# Patient Record
Sex: Male | Born: 1935 | Race: White | Hispanic: No | Smoking: Never smoker
Health system: Southern US, Community
[De-identification: ages and names within clinical notes are randomized; demographics above are authoritative.]

## PROBLEM LIST (undated history)

## (undated) DIAGNOSIS — M1990 Unspecified osteoarthritis, unspecified site: Secondary | ICD-10-CM

## (undated) DIAGNOSIS — D509 Iron deficiency anemia, unspecified: Secondary | ICD-10-CM

## (undated) DIAGNOSIS — I4891 Unspecified atrial fibrillation: Secondary | ICD-10-CM

## (undated) DIAGNOSIS — F329 Major depressive disorder, single episode, unspecified: Secondary | ICD-10-CM

## (undated) DIAGNOSIS — D696 Thrombocytopenia, unspecified: Secondary | ICD-10-CM

## (undated) DIAGNOSIS — N400 Enlarged prostate without lower urinary tract symptoms: Secondary | ICD-10-CM

## (undated) DIAGNOSIS — G20 Parkinson's disease: Secondary | ICD-10-CM

## (undated) DIAGNOSIS — I10 Essential (primary) hypertension: Secondary | ICD-10-CM

## (undated) DIAGNOSIS — F32A Depression, unspecified: Secondary | ICD-10-CM

## (undated) DIAGNOSIS — G20A1 Parkinson's disease without dyskinesia, without mention of fluctuations: Secondary | ICD-10-CM

## (undated) DIAGNOSIS — A0472 Enterocolitis due to Clostridium difficile, not specified as recurrent: Secondary | ICD-10-CM

## (undated) DIAGNOSIS — D649 Anemia, unspecified: Secondary | ICD-10-CM

## (undated) DIAGNOSIS — F0390 Unspecified dementia without behavioral disturbance: Secondary | ICD-10-CM

## (undated) HISTORY — PX: JOINT REPLACEMENT: SHX530

## (undated) HISTORY — PX: TOTAL KNEE ARTHROPLASTY: SHX125

## (undated) HISTORY — PX: CARPAL TUNNEL RELEASE: SHX101

## (undated) HISTORY — DX: Essential (primary) hypertension: I10

## (undated) HISTORY — DX: Unspecified osteoarthritis, unspecified site: M19.90

---

## 2000-11-02 ENCOUNTER — Ambulatory Visit (HOSPITAL_COMMUNITY): Admission: RE | Admit: 2000-11-02 | Discharge: 2000-11-02 | Payer: Self-pay | Admitting: *Deleted

## 2000-11-07 ENCOUNTER — Ambulatory Visit (HOSPITAL_COMMUNITY): Admission: RE | Admit: 2000-11-07 | Discharge: 2000-11-07 | Payer: Self-pay | Admitting: *Deleted

## 2002-02-10 ENCOUNTER — Encounter: Admission: RE | Admit: 2002-02-10 | Discharge: 2002-02-10 | Payer: Self-pay | Admitting: Internal Medicine

## 2002-02-10 ENCOUNTER — Encounter: Payer: Self-pay | Admitting: Internal Medicine

## 2005-08-28 ENCOUNTER — Inpatient Hospital Stay (HOSPITAL_COMMUNITY): Admission: EM | Admit: 2005-08-28 | Discharge: 2005-09-01 | Payer: Self-pay | Admitting: Emergency Medicine

## 2005-09-01 ENCOUNTER — Ambulatory Visit: Payer: Self-pay | Admitting: Physical Medicine & Rehabilitation

## 2005-10-30 ENCOUNTER — Inpatient Hospital Stay (HOSPITAL_BASED_OUTPATIENT_CLINIC_OR_DEPARTMENT_OTHER): Admission: RE | Admit: 2005-10-30 | Discharge: 2005-10-30 | Payer: Self-pay | Admitting: Pediatrics

## 2005-10-30 HISTORY — PX: CARDIAC CATHETERIZATION: SHX172

## 2005-11-02 ENCOUNTER — Inpatient Hospital Stay (HOSPITAL_COMMUNITY): Admission: AD | Admit: 2005-11-02 | Discharge: 2005-11-03 | Payer: Self-pay | Admitting: Cardiology

## 2005-11-02 HISTORY — PX: CORONARY ANGIOPLASTY WITH STENT PLACEMENT: SHX49

## 2005-11-30 ENCOUNTER — Encounter (HOSPITAL_COMMUNITY): Admission: RE | Admit: 2005-11-30 | Discharge: 2006-02-28 | Payer: Self-pay | Admitting: Cardiology

## 2006-03-01 ENCOUNTER — Encounter (HOSPITAL_COMMUNITY): Admission: RE | Admit: 2006-03-01 | Discharge: 2006-04-16 | Payer: Self-pay | Admitting: Cardiology

## 2006-04-17 ENCOUNTER — Encounter (HOSPITAL_COMMUNITY): Admission: RE | Admit: 2006-04-17 | Discharge: 2006-07-09 | Payer: Self-pay | Admitting: Cardiology

## 2006-07-10 ENCOUNTER — Encounter (HOSPITAL_COMMUNITY): Admission: RE | Admit: 2006-07-10 | Discharge: 2006-10-08 | Payer: Self-pay | Admitting: Cardiology

## 2006-10-09 ENCOUNTER — Encounter (HOSPITAL_COMMUNITY): Admission: RE | Admit: 2006-10-09 | Discharge: 2007-01-07 | Payer: Self-pay | Admitting: Cardiology

## 2007-01-08 ENCOUNTER — Encounter (HOSPITAL_COMMUNITY): Admission: RE | Admit: 2007-01-08 | Discharge: 2007-02-05 | Payer: Self-pay | Admitting: Cardiology

## 2007-01-15 ENCOUNTER — Encounter: Payer: Self-pay | Admitting: Cardiology

## 2007-01-15 ENCOUNTER — Observation Stay (HOSPITAL_COMMUNITY): Admission: EM | Admit: 2007-01-15 | Discharge: 2007-01-16 | Payer: Self-pay | Admitting: Emergency Medicine

## 2007-02-07 ENCOUNTER — Encounter (HOSPITAL_COMMUNITY): Admission: RE | Admit: 2007-02-07 | Discharge: 2007-05-08 | Payer: Self-pay | Admitting: Cardiology

## 2007-05-09 ENCOUNTER — Encounter (HOSPITAL_COMMUNITY): Admission: RE | Admit: 2007-05-09 | Discharge: 2007-08-07 | Payer: Self-pay | Admitting: Cardiology

## 2007-08-08 ENCOUNTER — Encounter (HOSPITAL_COMMUNITY): Admission: RE | Admit: 2007-08-08 | Discharge: 2007-11-06 | Payer: Self-pay | Admitting: Cardiology

## 2007-11-07 ENCOUNTER — Encounter (HOSPITAL_COMMUNITY): Admission: RE | Admit: 2007-11-07 | Discharge: 2008-02-03 | Payer: Self-pay | Admitting: Cardiology

## 2007-12-23 ENCOUNTER — Emergency Department (HOSPITAL_COMMUNITY): Admission: EM | Admit: 2007-12-23 | Discharge: 2007-12-23 | Payer: Self-pay | Admitting: Emergency Medicine

## 2008-02-07 ENCOUNTER — Encounter (HOSPITAL_COMMUNITY): Admission: RE | Admit: 2008-02-07 | Discharge: 2008-05-07 | Payer: Self-pay | Admitting: Cardiology

## 2008-05-08 ENCOUNTER — Encounter (HOSPITAL_COMMUNITY): Admission: RE | Admit: 2008-05-08 | Discharge: 2008-08-06 | Payer: Self-pay | Admitting: Cardiology

## 2008-08-07 ENCOUNTER — Encounter (HOSPITAL_COMMUNITY): Admission: RE | Admit: 2008-08-07 | Discharge: 2008-11-05 | Payer: Self-pay | Admitting: Cardiology

## 2008-10-28 ENCOUNTER — Inpatient Hospital Stay (HOSPITAL_COMMUNITY): Admission: RE | Admit: 2008-10-28 | Discharge: 2008-10-31 | Payer: Self-pay | Admitting: Orthopedic Surgery

## 2009-07-05 ENCOUNTER — Inpatient Hospital Stay (HOSPITAL_COMMUNITY): Admission: EM | Admit: 2009-07-05 | Discharge: 2009-07-07 | Payer: Self-pay | Admitting: Emergency Medicine

## 2010-02-03 ENCOUNTER — Emergency Department (HOSPITAL_COMMUNITY)
Admission: EM | Admit: 2010-02-03 | Discharge: 2010-02-03 | Payer: Self-pay | Source: Home / Self Care | Admitting: Emergency Medicine

## 2010-02-11 ENCOUNTER — Ambulatory Visit: Payer: Self-pay | Admitting: Cardiology

## 2010-04-18 LAB — PROTIME-INR
INR: 2.64 — ABNORMAL HIGH (ref 0.00–1.49)
Prothrombin Time: 28.3 seconds — ABNORMAL HIGH (ref 11.6–15.2)

## 2010-04-18 LAB — POCT CARDIAC MARKERS: Troponin i, poc: <0.05 ng/mL (ref 0.00–0.09)

## 2010-04-18 LAB — POCT I-STAT, CHEM 8
BUN: 21 mg/dL (ref 6–23)
Calcium, Ion: 1.18 mmol/L (ref 1.12–1.32)
Creatinine, Ser: 1.1 mg/dL (ref 0.4–1.5)
HCT: 35 % — ABNORMAL LOW (ref 39.0–52.0)
Potassium: 3.8 mEq/L (ref 3.5–5.1)
TCO2: 27 mmol/L (ref 0–100)

## 2010-04-25 LAB — POCT CARDIAC MARKERS
CKMB, poc: 1.3 ng/mL (ref 1.0–8.0)
Myoglobin, poc: 57.2 ng/mL (ref 12–200)
Myoglobin, poc: 86.2 ng/mL (ref 12–200)
Troponin i, poc: <0.05 ng/mL (ref 0.00–0.09)
Troponin i, poc: <0.05 ng/mL (ref 0.00–0.09)

## 2010-04-25 LAB — CBC
MCHC: 33.4 g/dL (ref 30.0–36.0)
MCV: 94.3 fL (ref 78.0–100.0)
MCV: 93.1 fL (ref 78.0–100.0)
Platelets: 92 10*3/uL — ABNORMAL LOW (ref 150–400)
Platelets: 89 10*3/uL — ABNORMAL LOW (ref 150–400)
Platelets: 90 10*3/uL — ABNORMAL LOW (ref 150–400)
RBC: 3.67 MIL/uL — ABNORMAL LOW (ref 4.22–5.81)
RBC: 3.53 MIL/uL — ABNORMAL LOW (ref 4.22–5.81)
RBC: 3.66 MIL/uL — ABNORMAL LOW (ref 4.22–5.81)
WBC: 5.9 10*3/uL (ref 4.0–10.5)

## 2010-04-25 LAB — CARDIAC PANEL(CRET KIN+CKTOT+MB+TROPI)
CK, MB: 3.1 ng/mL (ref 0.3–4.0)
CK, MB: 3.2 ng/mL (ref 0.3–4.0)
Relative Index: INVALID (ref 0.0–2.5)
Relative Index: INVALID (ref 0.0–2.5)
Total CK: 65 U/L (ref 7–232)
Troponin I: 0.05 ng/mL (ref 0.00–0.06)

## 2010-04-25 LAB — BASIC METABOLIC PANEL
CO2: 27 mEq/L (ref 19–32)
Calcium: 8.5 mg/dL (ref 8.4–10.5)
Calcium: 8.5 mg/dL (ref 8.4–10.5)
GFR calc Af Amer: >60 mL/min (ref >60)
GFR calc non Af Amer: >60 mL/min (ref >60)
GFR calc non Af Amer: >60 mL/min (ref >60)
Glucose, Bld: 110 mg/dL — ABNORMAL HIGH (ref 70–99)
Potassium: 3.6 mEq/L (ref 3.5–5.1)
Potassium: 3.7 mEq/L (ref 3.5–5.1)

## 2010-04-25 LAB — HEPARIN LEVEL (UNFRACTIONATED)
Heparin Unfractionated: 0.63 IU/mL (ref 0.30–0.70)
Heparin Unfractionated: 0.57 IU/mL (ref 0.30–0.70)
Heparin Unfractionated: 0.9 IU/mL — ABNORMAL HIGH (ref 0.30–0.70)

## 2010-04-25 LAB — TROPONIN I: Troponin I: 0.04 ng/mL (ref 0.00–0.06)

## 2010-04-25 LAB — CK TOTAL AND CKMB (NOT AT ARMC): Total CK: 70 U/L (ref 7–232)

## 2010-04-25 LAB — APTT: aPTT: 29 seconds (ref 24–37)

## 2010-04-25 LAB — PROTIME-INR
INR: 1.47 (ref 0.00–1.49)
INR: 1.51 — ABNORMAL HIGH (ref 0.00–1.49)

## 2010-05-13 LAB — CBC
HCT: 25.1 % — ABNORMAL LOW (ref 39.0–52.0)
HCT: 26.8 % — ABNORMAL LOW (ref 39.0–52.0)
Hemoglobin: 10 g/dL — ABNORMAL LOW (ref 13.0–17.0)
Hemoglobin: 8.6 g/dL — ABNORMAL LOW (ref 13.0–17.0)
Hemoglobin: 9.2 g/dL — ABNORMAL LOW (ref 13.0–17.0)
Hemoglobin: 12.3 g/dL — ABNORMAL LOW (ref 13.0–17.0)
MCV: 94 fL (ref 78.0–100.0)
MCV: 94.6 fL (ref 78.0–100.0)
MCV: 93.8 fL (ref 78.0–100.0)
Platelets: 74 10*3/uL — ABNORMAL LOW (ref 150–400)
RBC: 2.65 MIL/uL — ABNORMAL LOW (ref 4.22–5.81)
RBC: 2.85 MIL/uL — ABNORMAL LOW (ref 4.22–5.81)
RBC: 3.16 MIL/uL — ABNORMAL LOW (ref 4.22–5.81)
RBC: 3.93 MIL/uL — ABNORMAL LOW (ref 4.22–5.81)
WBC: 7.1 10*3/uL (ref 4.0–10.5)
WBC: 8.1 10*3/uL (ref 4.0–10.5)
WBC: 9.2 10*3/uL (ref 4.0–10.5)
WBC: 5.1 10*3/uL (ref 4.0–10.5)

## 2010-05-13 LAB — URINALYSIS, ROUTINE W REFLEX MICROSCOPIC
Hgb urine dipstick: NEGATIVE
Specific Gravity, Urine: 1.01 (ref 1.005–1.030)
Urobilinogen, UA: 0.2 mg/dL (ref 0.0–1.0)
pH: 7 (ref 5.0–8.0)

## 2010-05-13 LAB — BASIC METABOLIC PANEL
CO2: 28 mEq/L (ref 19–32)
Calcium: 8.2 mg/dL — ABNORMAL LOW (ref 8.4–10.5)
Chloride: 107 mEq/L (ref 96–112)
Creatinine, Ser: 0.97 mg/dL (ref 0.4–1.5)
GFR calc Af Amer: >60 mL/min (ref >60)
GFR calc Af Amer: >60 mL/min (ref >60)
GFR calc non Af Amer: >60 mL/min (ref >60)
Potassium: 4 mEq/L (ref 3.5–5.1)
Sodium: 134 mEq/L — ABNORMAL LOW (ref 135–145)
Sodium: 136 mEq/L (ref 135–145)

## 2010-05-13 LAB — COMPREHENSIVE METABOLIC PANEL
ALT: 19 U/L (ref 0–53)
AST: 33 U/L (ref 0–37)
CO2: 30 mEq/L (ref 19–32)
Chloride: 105 mEq/L (ref 96–112)
Creatinine, Ser: 1.01 mg/dL (ref 0.4–1.5)
GFR calc Af Amer: >60 mL/min (ref >60)
GFR calc non Af Amer: >60 mL/min (ref >60)
Sodium: 140 mEq/L (ref 135–145)
Total Bilirubin: 1 mg/dL (ref 0.3–1.2)

## 2010-05-13 LAB — PROTIME-INR
INR: 1.3 (ref 0.00–1.49)
Prothrombin Time: 14.9 seconds (ref 11.6–15.2)
Prothrombin Time: 15.7 seconds — ABNORMAL HIGH (ref 11.6–15.2)
Prothrombin Time: 20.1 seconds — ABNORMAL HIGH (ref 11.6–15.2)

## 2010-05-13 LAB — ABO/RH: ABO/RH(D): A POS

## 2010-05-13 LAB — TYPE AND SCREEN

## 2010-06-21 NOTE — Discharge Summary (Signed)
NAME:  Henry Alvarez, Henry Alvarez NO.:  [id number]   MEDICAL RECORD NO.:  [id number]          PATIENT TYPE:  INP   LOCATION:  2004                         FACILITY:  [facility]   PHYSICIAN:  Peter M. Swaziland, M.D.  DATE OF BIRTH:  09-Mar-1924   DATE OF ADMISSION:  01/15/2007  DATE OF DISCHARGE:  01/16/2007                               DISCHARGE SUMMARY   HISTORY OF PRESENT ILLNESS:  Henry Alvarez is an 77 year old white male  who presented with new onset of atrial fibrillation.  This awoke him  from sleep at 4 a.m.  He had some vague sensation in his chest and arms  and jaw tightness and noticed his heart racing.  He presented to the  emergency room and was found to be in atrial fibrillation with rapid  ventricular response at a rate of 125 beats per minute.  He subsequently  converted to normal sinus rhythm.  He was admitted for further  observation.  For details his past medical history, social history,  family history and physical exam, please see admission history and  physical.   LABORATORY DATA:  Chest x-ray showed no active disease.  ECG showed  atrial fibrillation with a rapid ventricular response.  He had LVH and  nonspecific ST abnormality.  Subsequent ECG showed sinus bradycardia,  again with LVH and nonspecific T-wave abnormality.  Serial cardiac  enzymes were negative x4.  Sodium 139, potassium 4.0, chloride 105, CO2  26, BUN 16, creatinine 1.1, glucose was 104.  White count was 4900,  hemoglobin 12.5, hematocrit 36.3, platelets 116,000.  Free T4 was 1.25.  TSH was 1.727.   HOSPITAL COURSE:  The patient was admitted to telemetry monitoring.  He  was started on subcu Lovenox and begun on Coumadin.  We did not place  him on the AV nodal blocking drugs due to the fact that he had a marked  sinus bradycardia.  During the 24 hours of this stay, he had persistent  sinus bradycardia with heart rates running anywhere from the mid 40s to  mid 50s.  He had no pauses.  He  remained asymptomatic.  The patient's  history does demonstrate per wife that he has a history of heavy snoring  and some apneic spells, so we recommended follow-up sleep study as an  outpatient.  It was felt that if he did have recurrent atrial  fibrillation that required additional medication he would require a  pacemaker, but at this time he was doing very well.  We did obtain an  echocardiogram.  This demonstrated normal left ventricular function.  He  had mild aortic stenosis and moderate aortic insufficiency, and he had  mild mitral insufficiency.  The patient was discharged home in stable  condition on January 16, 2007.   DISCHARGE MEDICATIONS:  1. Norvasc 5 mg per day.  2. Simvastatin to be continued on current dose.  3. Multivitamin daily.  4. Calcium supplementation twice daily.  5. Flomax 0.4 mg every other day.  6. Aspirin was reduced to 81 mg per day.  7. Will start Coumadin 5 mg per day.   I  have recommended a follow-up pro time at Dr. Rinaldo Cloud office on Monday  December 15.  We will arrange a sleep study as an outpatient.  He will  see Dr. Swaziland back in 1 week for follow-up.   DISCHARGE STATUS:  Improved.           ______________________________  Peter M. Swaziland, M.D.     PMJ/MEDQ  D:  01/16/2007  T:  01/16/2007  Job:  [id number]   cc:   Jeannett Senior A. Evlyn Kanner, M.D.
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
## 2010-06-24 NOTE — Cardiovascular Report (Signed)
NAME:  Henry Alvarez, Henry Alvarez NO.:  [id number]   MEDICAL RECORD NO.:  [id number]          PATIENT TYPE:  INP   LOCATION:  6523                         FACILITY:  [facility]   PHYSICIAN:  Peter M. [place]aziland, M.D.  DATE OF BIRTH:  [birth year]   DATE OF PROCEDURE:  11/02/2005  DATE OF DISCHARGE:  11/03/2005                              CARDIAC CATHETERIZATION   INDICATIONS FOR PROCEDURE:  The patient is a 77 year old white male who  presents with 3 months' history of exertional angina.  He had a strongly  positive stress Cardiolite study.  Subsequent cardiac catheterization  demonstrated single-vessel obstructive disease with a high-grade 90%  stenosis in the proximal LAD.   PROCEDURE:  Intracoronary stenting of the proximal left anterior descending  artery.   ACCESS:  Via right femoral artery using standard Seldinger technique.   EQUIPMENT USED:  A 6-French FL-5 guide, a 0.014 high-torque floppy wire, a  3.0 x 12 mm Liberte stent and a 3.0 x 12 mm Quantum Maverick balloon.   CONTRAST:  Omnipaque 95 mL.   MEDICATIONS:  1. Angiomax bolus at 0.75 mg/kg, followed by continuous infusion of 1.75      mg/kg per hour.  2. Nitroglycerin 200 mcg intracoronary x2.   ACT was 314 seconds.  The patient remained hemodynamically stable throughout  the procedure.   We proceeded with intracoronary stenting of the proximal LAD.  This lesion  was crossed easily with the wire.  We primarily stented the lesion using a  3.0 x 12 mm Liberte stent.  This was deployed at 9 atmospheres and then  postdilated to 14 atmospheres with the stent balloon.  We then exchanged for  a 3.0 x 12 mm Quantum balloon and postdilated to 16 atmospheres.  This  yielded an excellent angiographic result with 0% residual stenosis and TIMI  grade 3 flow.  The patient was pain-free at the end of procedure.   FINAL INTERPRETATION:  Successful intracoronary stenting of the proximal  left anterior descending artery.          ______________________________  Peter M. [place], M.D.     PMJ/MEDQ  D:  11/02/2005  T:  11/04/2005  Job:  [id number]   cc:   Jeannett Senior A. Evlyn Kanner, M.D.

## 2010-08-08 ENCOUNTER — Encounter: Payer: Self-pay | Admitting: *Deleted

## 2010-08-12 ENCOUNTER — Encounter: Payer: Self-pay | Admitting: Cardiology

## 2010-08-16 ENCOUNTER — Encounter: Payer: Self-pay | Admitting: Cardiology

## 2010-08-16 ENCOUNTER — Ambulatory Visit (INDEPENDENT_AMBULATORY_CARE_PROVIDER_SITE_OTHER): Payer: Medicare Other | Admitting: Cardiology

## 2010-08-16 VITALS — BP 140/88 | HR 53 | Ht 67.0 in | Wt 168.4 lb

## 2010-08-16 DIAGNOSIS — I480 Paroxysmal atrial fibrillation: Secondary | ICD-10-CM | POA: Insufficient documentation

## 2010-08-16 DIAGNOSIS — M4850XA Collapsed vertebra, not elsewhere classified, site unspecified, initial encounter for fracture: Secondary | ICD-10-CM

## 2010-08-16 DIAGNOSIS — I4891 Unspecified atrial fibrillation: Secondary | ICD-10-CM

## 2010-08-16 DIAGNOSIS — E785 Hyperlipidemia, unspecified: Secondary | ICD-10-CM

## 2010-08-16 DIAGNOSIS — W19XXXA Unspecified fall, initial encounter: Secondary | ICD-10-CM

## 2010-08-16 DIAGNOSIS — Z7901 Long term (current) use of anticoagulants: Secondary | ICD-10-CM | POA: Insufficient documentation

## 2010-08-16 DIAGNOSIS — I2510 Atherosclerotic heart disease of native coronary artery without angina pectoris: Secondary | ICD-10-CM

## 2010-08-16 DIAGNOSIS — I10 Essential (primary) hypertension: Secondary | ICD-10-CM

## 2010-08-16 NOTE — Assessment & Plan Note (Signed)
He has had no recurrent atrial fibrillation since December of 2011 at which time he converted spontaneously. We did discuss the possibility that if the had recurrences we would need to consider a pacemaker for management.
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
## 2010-08-16 NOTE — Patient Instructions (Signed)
Continue your current medications.  Call me if you have any more falls, blacking out, or dizzyness.  I will see you again in 6 months.

## 2010-08-16 NOTE — Assessment & Plan Note (Addendum)
He remains asymptomatic. We will continue with his risk factor modification. Stress Cardiolite study in June of 2011 was normal.

## 2010-08-16 NOTE — Assessment & Plan Note (Signed)
It is unclear what precipitated this fall and whether or not he may have had some associated lightheadedness or syncope. He does have a long history of sinus bradycardia with intermittent atrial fibrillation. He is on no rate slowing medications at this time. I have recommended that if he has any recurrent falls, syncope, or lightheadedness that he let me know. I think at least we would need to consider an event monitor to make sure he is not having significant pauses. For this period of time we will continue to monitor.

## 2010-08-18 ENCOUNTER — Encounter: Payer: Self-pay | Admitting: Cardiology

## 2010-09-07 HISTORY — PX: KYPHOSIS SURGERY: SHX114

## 2010-09-26 ENCOUNTER — Encounter (HOSPITAL_COMMUNITY)
Admission: RE | Admit: 2010-09-26 | Discharge: 2010-09-26 | Disposition: A | Discharge status: Home / Self Care | Payer: Medicare Other | Source: Ambulatory Visit | Attending: Orthopedic Surgery | Admitting: Orthopedic Surgery

## 2010-09-26 ENCOUNTER — Ambulatory Visit (HOSPITAL_COMMUNITY)
Admission: RE | Admit: 2010-09-26 | Discharge: 2010-09-26 | Disposition: A | Discharge status: Home / Self Care | Payer: Medicare Other | Source: Ambulatory Visit | Attending: Orthopedic Surgery | Admitting: Orthopedic Surgery

## 2010-09-26 ENCOUNTER — Telehealth: Payer: Self-pay | Admitting: Cardiology

## 2010-09-26 ENCOUNTER — Other Ambulatory Visit (HOSPITAL_COMMUNITY): Payer: Self-pay | Admitting: Orthopedic Surgery

## 2010-09-26 DIAGNOSIS — Z01818 Encounter for other preprocedural examination: Secondary | ICD-10-CM | POA: Insufficient documentation

## 2010-09-26 DIAGNOSIS — M8448XA Pathological fracture, other site, initial encounter for fracture: Secondary | ICD-10-CM | POA: Insufficient documentation

## 2010-09-26 DIAGNOSIS — S22000A Wedge compression fracture of unspecified thoracic vertebra, initial encounter for closed fracture: Secondary | ICD-10-CM

## 2010-09-26 DIAGNOSIS — M4120 Other idiopathic scoliosis, site unspecified: Secondary | ICD-10-CM | POA: Insufficient documentation

## 2010-09-26 DIAGNOSIS — K5900 Constipation, unspecified: Secondary | ICD-10-CM | POA: Insufficient documentation

## 2010-09-26 DIAGNOSIS — Z01812 Encounter for preprocedural laboratory examination: Secondary | ICD-10-CM | POA: Insufficient documentation

## 2010-09-26 DIAGNOSIS — S32000A Wedge compression fracture of unspecified lumbar vertebra, initial encounter for closed fracture: Secondary | ICD-10-CM

## 2010-09-26 DIAGNOSIS — M479 Spondylosis, unspecified: Secondary | ICD-10-CM | POA: Insufficient documentation

## 2010-09-26 LAB — BASIC METABOLIC PANEL
BUN: 16 mg/dL (ref 6–23)
CO2: 29 mEq/L (ref 19–32)
Chloride: 106 mEq/L (ref 96–112)
Creatinine, Ser: 0.89 mg/dL (ref 0.50–1.35)

## 2010-09-26 LAB — CBC
HCT: 37.8 % — ABNORMAL LOW (ref 39.0–52.0)
MCV: 92.9 fL (ref 78.0–100.0)
Platelets: 121 10*3/uL — ABNORMAL LOW (ref 150–400)
RBC: 4.07 MIL/uL — ABNORMAL LOW (ref 4.22–5.81)
RDW: 13.6 % (ref 11.5–15.5)
WBC: 5.9 10*3/uL (ref 4.0–10.5)

## 2010-09-26 LAB — APTT: aPTT: 47 seconds — ABNORMAL HIGH (ref 24–37)

## 2010-09-26 LAB — PROTIME-INR: INR: 2.22 — ABNORMAL HIGH (ref 0.00–1.49)

## 2010-09-26 NOTE — Telephone Encounter (Signed)
Fax all recent notes and diagnostic testing; including any EKG

## 2010-09-27 NOTE — Telephone Encounter (Signed)
Faxed last 2 OV Notes, last 2 EKG's, No ECHO available, and last Stress Faxed all today

## 2010-10-05 ENCOUNTER — Ambulatory Visit (HOSPITAL_COMMUNITY): Payer: Medicare Other

## 2010-10-05 ENCOUNTER — Ambulatory Visit (HOSPITAL_COMMUNITY)
Admission: RE | Admit: 2010-10-05 | Discharge: 2010-10-07 | Disposition: A | Discharge status: Home / Self Care | Payer: Medicare Other | Source: Ambulatory Visit | Attending: Orthopedic Surgery | Admitting: Orthopedic Surgery

## 2010-10-05 DIAGNOSIS — Z01812 Encounter for preprocedural laboratory examination: Secondary | ICD-10-CM | POA: Insufficient documentation

## 2010-10-05 DIAGNOSIS — Z8673 Personal history of transient ischemic attack (TIA), and cerebral infarction without residual deficits: Secondary | ICD-10-CM | POA: Insufficient documentation

## 2010-10-05 DIAGNOSIS — I4891 Unspecified atrial fibrillation: Secondary | ICD-10-CM | POA: Insufficient documentation

## 2010-10-05 DIAGNOSIS — I2510 Atherosclerotic heart disease of native coronary artery without angina pectoris: Secondary | ICD-10-CM | POA: Insufficient documentation

## 2010-10-05 DIAGNOSIS — Z7901 Long term (current) use of anticoagulants: Secondary | ICD-10-CM | POA: Insufficient documentation

## 2010-10-05 DIAGNOSIS — Z96659 Presence of unspecified artificial knee joint: Secondary | ICD-10-CM | POA: Insufficient documentation

## 2010-10-05 DIAGNOSIS — M129 Arthropathy, unspecified: Secondary | ICD-10-CM | POA: Insufficient documentation

## 2010-10-05 DIAGNOSIS — M4120 Other idiopathic scoliosis, site unspecified: Secondary | ICD-10-CM | POA: Insufficient documentation

## 2010-10-05 DIAGNOSIS — Z9861 Coronary angioplasty status: Secondary | ICD-10-CM | POA: Insufficient documentation

## 2010-10-05 DIAGNOSIS — R339 Retention of urine, unspecified: Secondary | ICD-10-CM | POA: Insufficient documentation

## 2010-10-05 DIAGNOSIS — M8448XA Pathological fracture, other site, initial encounter for fracture: Secondary | ICD-10-CM | POA: Insufficient documentation

## 2010-10-05 DIAGNOSIS — I10 Essential (primary) hypertension: Secondary | ICD-10-CM | POA: Insufficient documentation

## 2010-10-05 LAB — URINE MICROSCOPIC-ADD ON

## 2010-10-05 LAB — PROTIME-INR
INR: 1.2 (ref 0.00–1.49)
Prothrombin Time: 15.5 s — ABNORMAL HIGH (ref 11.6–15.2)

## 2010-10-05 LAB — URINALYSIS, ROUTINE W REFLEX MICROSCOPIC
Bilirubin Urine: NEGATIVE
Nitrite: NEGATIVE
Specific Gravity, Urine: 1.017 (ref 1.005–1.030)
Urobilinogen, UA: 0.2 mg/dL (ref 0.0–1.0)

## 2010-10-05 LAB — APTT: aPTT: 30 s (ref 24–37)

## 2010-10-06 LAB — URINE CULTURE: Culture  Setup Time: 201208292301

## 2010-10-07 NOTE — Op Note (Signed)
  NAME:  CLIF, SERIO NO.:  [id number]  MEDICAL RECORD NO.:  [id number]  LOCATION:                                 FACILITY:  PHYSICIAN:  Alvy Beal, MD    DATE OF BIRTH:  1924-05-28  DATE OF PROCEDURE:  10/05/2010 DATE OF DISCHARGE:                              OPERATIVE REPORT   PREOPERATIVE DIAGNOSIS:  Compression fracture deformity (pathologic) T11 and L2.  POSTOPERATIVE DIAGNOSIS:  Compression fracture deformity (pathologic) T11 and L2.  OPERATIVE PROCEDURE:  T11-L2 kyphoplasty.  COMPLICATIONS:  The patient has underlying significant degenerative scoliosis which made accessing the left T11 and L2 pedicles quite difficult and so I just did a unilateral approach.  There was no cement leak, had good fill.  FIRST ASSISTANT:  Norval Gable, [place].  HISTORY:  This is a very pleasant 77 year old gentleman with significant back pain with no radicular leg pain.  Attempts at conservative management failed to alleviate his pain and he was diagnosed with acute to subacute L2 fracture with a subacute to chronic T11 fracture.  After discussing risks, benefits, and alternative of surgery, he elected to proceed.  All appropriate risks and benefits were reviewed.  OPERATIVE NOTE:  The patient was brought to the operating room, placed supine on the operating table.  After successful induction of general anesthesia and endotracheal intubation, TEDs, SCDs were applied.  He was turned prone onto a chest and pelvic roll.  The back was prepped and draped in standard fashion.  A time-out was done to confirm patient procedure, affected levels.  Once this was done, 2 x-rays were sterilely brought into the field.  I identified the T11 vertebral body in the AP and lateral planes.  A small stab incision was made on the lateral aspect of each pedicle.  Trocars were advanced percutaneously to the lateral aspect of pedicle.  A extrapedicular/transpedicular approach  was taken.  Once the trocar was beyond the posterior body of the vertebral wall, I removed the inner aspect and left the working portal in place. I repeated this at L2.  Because of his underlying deformity, it was quite difficult getting the left-sided pedicle cannulated either from a transpedicular or extrapedicular approach.  Because of the difficulty and relative risks to the surrounding soft tissues, I elected not to place them and I only used the unilateral working trocar.  I then placed balloons, inflated them, and then inserted the cement.  There was no adverse complications placed in the cement.  Once the cement was allowed to cure, I removed the trocars, washed the wounds, closed them with interrupted 3-0 Monocryl, and dry dressings.  The patient was extubated, transferred to the PACU without incident.  At the end of the case, all needle and sponge counts were correct.     Alvy Beal, MD     DDB/MEDQ  D:  10/05/2010  T:  10/05/2010  Job:  [id number]  Electronically Signed by Venita Lick MD on 10/07/2010 03:50:18 PM

## 2010-10-12 ENCOUNTER — Emergency Department (HOSPITAL_COMMUNITY)
Admission: EM | Admit: 2010-10-12 | Discharge: 2010-10-13 | Disposition: A | Discharge status: Home / Self Care | Payer: Medicare Other | Attending: Emergency Medicine | Admitting: Emergency Medicine

## 2010-10-12 DIAGNOSIS — Z7901 Long term (current) use of anticoagulants: Secondary | ICD-10-CM | POA: Insufficient documentation

## 2010-10-12 DIAGNOSIS — I2510 Atherosclerotic heart disease of native coronary artery without angina pectoris: Secondary | ICD-10-CM | POA: Insufficient documentation

## 2010-10-12 DIAGNOSIS — R339 Retention of urine, unspecified: Secondary | ICD-10-CM | POA: Insufficient documentation

## 2010-10-12 DIAGNOSIS — I4891 Unspecified atrial fibrillation: Secondary | ICD-10-CM | POA: Insufficient documentation

## 2010-10-12 DIAGNOSIS — R82998 Other abnormal findings in urine: Secondary | ICD-10-CM | POA: Insufficient documentation

## 2010-10-12 LAB — POCT I-STAT, CHEM 8
BUN: 18 mg/dL (ref 6–23)
Calcium, Ion: 1.17 mmol/L (ref 1.12–1.32)
Creatinine, Ser: 1.1 mg/dL (ref 0.50–1.35)
Glucose, Bld: 101 mg/dL — ABNORMAL HIGH (ref 70–99)
TCO2: 24 mmol/L (ref 0–100)

## 2010-10-12 LAB — URINALYSIS, ROUTINE W REFLEX MICROSCOPIC
Ketones, ur: NEGATIVE mg/dL
Nitrite: NEGATIVE
Urobilinogen, UA: 0.2 mg/dL (ref 0.0–1.0)
pH: 6 (ref 5.0–8.0)

## 2010-10-12 LAB — URINE MICROSCOPIC-ADD ON

## 2010-10-14 LAB — URINE CULTURE: Colony Count: >=100000

## 2010-11-08 LAB — POCT I-STAT, CHEM 8
HCT: 39
Hemoglobin: 13.3
Potassium: 3.8
Sodium: 141
TCO2: 26

## 2010-11-08 LAB — PROTIME-INR: INR: 2.5 — ABNORMAL HIGH

## 2010-11-08 LAB — POCT CARDIAC MARKERS: Myoglobin, poc: 119

## 2010-11-14 LAB — POCT CARDIAC MARKERS
Myoglobin, poc: 101
Operator id: 198171
Troponin i, poc: <0.05

## 2010-11-14 LAB — CBC
Hemoglobin: 12.5 — ABNORMAL LOW
MCHC: 34.4
MCV: 91.8
RBC: 3.95 — ABNORMAL LOW
WBC: 4.9

## 2010-11-14 LAB — DIFFERENTIAL
Basophils Relative: 1
Eosinophils Absolute: 0.1 — ABNORMAL LOW
Lymphs Abs: 1.2
Monocytes Absolute: 0.6
Monocytes Relative: 13 — ABNORMAL HIGH
Neutro Abs: 3
Neutrophils Relative %: 61

## 2010-11-14 LAB — TROPONIN I: Troponin I: 0.02

## 2010-11-14 LAB — CARDIAC PANEL(CRET KIN+CKTOT+MB+TROPI)
Relative Index: INVALID
Total CK: 79

## 2010-11-14 LAB — I-STAT 8, (EC8 V) (CONVERTED LAB)
Acid-Base Excess: 1
BUN: 16
Chloride: 105
HCT: 40
Operator id: 198171
Potassium: 4
pCO2, Ven: 44.1 — ABNORMAL LOW

## 2010-11-14 LAB — TSH: TSH: 1.727

## 2010-11-14 LAB — APTT: aPTT: 30

## 2010-11-14 LAB — CK TOTAL AND CKMB (NOT AT ARMC): Relative Index: INVALID

## 2010-11-14 LAB — POCT I-STAT CREATININE: Creatinine, Ser: 1.1

## 2010-11-16 ENCOUNTER — Encounter: Payer: Self-pay | Admitting: *Deleted

## 2010-12-08 HISTORY — PX: TRANSURETHRAL RESECTION OF PROSTATE: SHX73

## 2010-12-10 ENCOUNTER — Emergency Department (HOSPITAL_COMMUNITY)
Admission: EM | Admit: 2010-12-10 | Discharge: 2010-12-10 | Disposition: A | Discharge status: Home / Self Care | Payer: Medicare Other | Attending: Emergency Medicine | Admitting: Emergency Medicine

## 2010-12-10 DIAGNOSIS — Z711 Person with feared health complaint in whom no diagnosis is made: Secondary | ICD-10-CM | POA: Insufficient documentation

## 2010-12-12 ENCOUNTER — Encounter (HOSPITAL_COMMUNITY): Payer: Self-pay | Admitting: Pharmacy Technician

## 2010-12-13 ENCOUNTER — Encounter (HOSPITAL_COMMUNITY): Payer: Self-pay

## 2010-12-13 ENCOUNTER — Encounter (HOSPITAL_COMMUNITY): Payer: Medicare Other

## 2010-12-13 LAB — CBC
Hemoglobin: 11.8 g/dL — ABNORMAL LOW (ref 13.0–17.0)
MCH: 30.8 pg (ref 26.0–34.0)
MCV: 92.7 fL (ref 78.0–100.0)
RBC: 3.83 MIL/uL — ABNORMAL LOW (ref 4.22–5.81)
WBC: 4.9 10*3/uL (ref 4.0–10.5)

## 2010-12-13 LAB — COMPREHENSIVE METABOLIC PANEL
ALT: 13 U/L (ref 0–53)
AST: 30 U/L (ref 0–37)
Alkaline Phosphatase: 86 U/L (ref 39–117)
Calcium: 9.4 mg/dL (ref 8.4–10.5)
GFR calc Af Amer: 87 mL/min — ABNORMAL LOW (ref >90)
Glucose, Bld: 85 mg/dL (ref 70–99)
Potassium: 4.2 mEq/L (ref 3.5–5.1)
Sodium: 135 mEq/L (ref 135–145)
Total Protein: 6.7 g/dL (ref 6.0–8.3)

## 2010-12-13 LAB — PROTIME-INR: Prothrombin Time: 19.6 seconds — ABNORMAL HIGH (ref 11.6–15.2)

## 2010-12-13 LAB — APTT: aPTT: 35 seconds (ref 24–37)

## 2010-12-13 LAB — SURGICAL PCR SCREEN: MRSA, PCR: NEGATIVE

## 2010-12-13 NOTE — Pre-Procedure Instructions (Signed)
NOTE OF CARDIAC CLEARANCE FROM DR. PETER Swaziland ON CHART CARDIOLOGY OFFICE NOTE AND EKG REPORT 08/16/10 ON CHART ECHO CARDIOGRAM REPORT 07/06/09 [facility] ON CHART CXR REPORT 09/26/10 [facility] ON CHART

## 2010-12-21 ENCOUNTER — Encounter (HOSPITAL_COMMUNITY)
Admission: RE | Disposition: A | Discharge status: Home / Self Care | Payer: Self-pay | Source: Ambulatory Visit | Attending: Urology

## 2010-12-21 ENCOUNTER — Encounter (HOSPITAL_COMMUNITY): Payer: Self-pay | Admitting: *Deleted

## 2010-12-21 ENCOUNTER — Encounter (HOSPITAL_COMMUNITY): Payer: Self-pay | Admitting: Certified Registered Nurse Anesthetist

## 2010-12-21 ENCOUNTER — Ambulatory Visit (HOSPITAL_COMMUNITY)
Admission: RE | Admit: 2010-12-21 | Discharge: 2010-12-22 | Disposition: A | Discharge status: Home / Self Care | Payer: Medicare Other | Source: Ambulatory Visit | Attending: Urology | Admitting: Urology

## 2010-12-21 ENCOUNTER — Other Ambulatory Visit: Payer: Self-pay | Admitting: Urology

## 2010-12-21 DIAGNOSIS — N400 Enlarged prostate without lower urinary tract symptoms: Secondary | ICD-10-CM

## 2010-12-21 DIAGNOSIS — I4891 Unspecified atrial fibrillation: Secondary | ICD-10-CM | POA: Insufficient documentation

## 2010-12-21 DIAGNOSIS — N401 Enlarged prostate with lower urinary tract symptoms: Secondary | ICD-10-CM | POA: Insufficient documentation

## 2010-12-21 DIAGNOSIS — Z7982 Long term (current) use of aspirin: Secondary | ICD-10-CM | POA: Insufficient documentation

## 2010-12-21 DIAGNOSIS — Z79899 Other long term (current) drug therapy: Secondary | ICD-10-CM | POA: Insufficient documentation

## 2010-12-21 DIAGNOSIS — N320 Bladder-neck obstruction: Secondary | ICD-10-CM | POA: Insufficient documentation

## 2010-12-21 DIAGNOSIS — N138 Other obstructive and reflux uropathy: Secondary | ICD-10-CM | POA: Insufficient documentation

## 2010-12-21 DIAGNOSIS — R339 Retention of urine, unspecified: Secondary | ICD-10-CM | POA: Insufficient documentation

## 2010-12-21 DIAGNOSIS — Z8739 Personal history of other diseases of the musculoskeletal system and connective tissue: Secondary | ICD-10-CM | POA: Insufficient documentation

## 2010-12-21 DIAGNOSIS — I10 Essential (primary) hypertension: Secondary | ICD-10-CM | POA: Insufficient documentation

## 2010-12-21 DIAGNOSIS — E7800 Pure hypercholesterolemia, unspecified: Secondary | ICD-10-CM | POA: Insufficient documentation

## 2010-12-21 DIAGNOSIS — Z01812 Encounter for preprocedural laboratory examination: Secondary | ICD-10-CM | POA: Insufficient documentation

## 2010-12-21 HISTORY — PX: CYSTOSCOPY: SHX5120

## 2010-12-21 LAB — BASIC METABOLIC PANEL
BUN: 17 mg/dL (ref 6–23)
Calcium: 9.2 mg/dL (ref 8.4–10.5)
GFR calc non Af Amer: 79 mL/min — ABNORMAL LOW (ref >90)
Glucose, Bld: 86 mg/dL (ref 70–99)
Potassium: 4 mEq/L (ref 3.5–5.1)

## 2010-12-21 SURGERY — TRANSURETHRAL PROSTATECTOMY WITH GYRUS INSTRUMENTS
Anesthesia: General

## 2010-12-21 MED ORDER — GLYCOPYRROLATE 0.2 MG/ML IJ SOLN
INTRAMUSCULAR | Status: DC | PRN
  Administered 2010-12-21: 0.2 mg via INTRAVENOUS

## 2010-12-21 MED ORDER — VANCOMYCIN HCL IN DEXTROSE 1-5 GM/200ML-% IV SOLN
1000.0000 mg | Freq: Two times a day (BID) | INTRAVENOUS | Status: AC
  Administered 2010-12-22: 1000 mg via INTRAVENOUS
  Filled 2010-12-21: qty 200

## 2010-12-21 MED ORDER — BACITRACIN-NEOMYCIN-POLYMYXIN 400-5-5000 EX OINT: 1.0000 "application " | TOPICAL_OINTMENT | Freq: Three times a day (TID) | CUTANEOUS | Status: DC | PRN

## 2010-12-21 MED ORDER — ROSUVASTATIN CALCIUM 40 MG PO TABS
40.0000 mg | ORAL_TABLET | Freq: Every day | ORAL | Status: DC
  Administered 2010-12-21 – 2010-12-22 (×2): 40 mg via ORAL
  Filled 2010-12-21 (×2): qty 1

## 2010-12-21 MED ORDER — LACTATED RINGERS IV SOLN
INTRAVENOUS | Status: DC | PRN
  Administered 2010-12-21 (×2): via INTRAVENOUS

## 2010-12-21 MED ORDER — MORPHINE SULFATE 2 MG/ML IJ SOLN: 2.0000 mg | INTRAMUSCULAR | Status: DC | PRN

## 2010-12-21 MED ORDER — STERILE WATER FOR IRRIGATION IR SOLN
Status: DC | PRN
  Administered 2010-12-21: 30 mL via INTRAVESICAL

## 2010-12-21 MED ORDER — OXYCODONE HCL 5 MG PO TABS
5.0000 mg | ORAL_TABLET | ORAL | Status: DC | PRN
  Administered 2010-12-21: 5 mg via ORAL
  Administered 2010-12-22: 10 mg via ORAL
  Filled 2010-12-21: qty 2
  Filled 2010-12-21: qty 1

## 2010-12-21 MED ORDER — FENTANYL CITRATE 0.05 MG/ML IJ SOLN
INTRAMUSCULAR | Status: DC | PRN
  Administered 2010-12-21 (×2): 25 ug via INTRAVENOUS
  Administered 2010-12-21 (×5): 50 ug via INTRAVENOUS
  Administered 2010-12-21 (×2): 25 ug via INTRAVENOUS

## 2010-12-21 MED ORDER — BELLADONNA ALKALOIDS-OPIUM 16.2-60 MG RE SUPP
RECTAL | Status: DC | PRN
  Administered 2010-12-21: 1 via RECTAL

## 2010-12-21 MED ORDER — FENTANYL CITRATE 0.05 MG/ML IJ SOLN
25.0000 ug | INTRAMUSCULAR | Status: DC | PRN
  Administered 2010-12-21 (×2): 50 ug via INTRAVENOUS

## 2010-12-21 MED ORDER — ONDANSETRON HCL 4 MG/2ML IJ SOLN: 4.0000 mg | INTRAMUSCULAR | Status: DC | PRN

## 2010-12-21 MED ORDER — ONDANSETRON HCL 4 MG/2ML IJ SOLN
INTRAMUSCULAR | Status: DC | PRN
  Administered 2010-12-21: 4 mg via INTRAVENOUS

## 2010-12-21 MED ORDER — ACETAMINOPHEN 10 MG/ML IV SOLN
1000.0000 mg | Freq: Four times a day (QID) | INTRAVENOUS | Status: DC
  Administered 2010-12-21 – 2010-12-22 (×3): 1000 mg via INTRAVENOUS
  Filled 2010-12-21 (×4): qty 100

## 2010-12-21 MED ORDER — VANCOMYCIN HCL 1000 MG IV SOLR
750.0000 mg | INTRAVENOUS | Status: AC
  Administered 2010-12-21: 750 mg via INTRAVENOUS
  Filled 2010-12-21: qty 750

## 2010-12-21 MED ORDER — SODIUM CHLORIDE 0.9 % IR SOLN
3000.0000 mL | Status: DC
  Administered 2010-12-21: 3000 mL

## 2010-12-21 MED ORDER — SODIUM CHLORIDE 0.9 % IR SOLN
Status: DC | PRN
  Administered 2010-12-21: 11000 mL

## 2010-12-21 MED ORDER — MEPERIDINE HCL 50 MG/ML IJ SOLN: 6.2500 mg | INTRAMUSCULAR | Status: DC | PRN

## 2010-12-21 MED ORDER — CEFAZOLIN SODIUM 1-5 GM-% IV SOLN: 1.0000 g | Freq: Once | INTRAVENOUS | Status: DC

## 2010-12-21 MED ORDER — SODIUM CHLORIDE 0.9 % IV SOLN
INTRAVENOUS | Status: DC
  Administered 2010-12-21: 20:00:00 via INTRAVENOUS

## 2010-12-21 MED ORDER — SENNOSIDES-DOCUSATE SODIUM 8.6-50 MG PO TABS
1.0000 | ORAL_TABLET | Freq: Two times a day (BID) | ORAL | Status: DC
  Administered 2010-12-21 – 2010-12-22 (×2): 1 via ORAL
  Filled 2010-12-21 (×3): qty 1

## 2010-12-21 MED ORDER — ACETAMINOPHEN 325 MG PO TABS: 650.0000 mg | ORAL_TABLET | ORAL | Status: DC | PRN

## 2010-12-21 MED ORDER — BELLADONNA ALKALOIDS-OPIUM 16.2-60 MG RE SUPP
RECTAL | Status: AC
  Filled 2010-12-21: qty 1

## 2010-12-21 MED ORDER — TAMSULOSIN HCL 0.4 MG PO CAPS
0.4000 mg | ORAL_CAPSULE | Freq: Every day | ORAL | Status: DC
  Administered 2010-12-21: 0.4 mg via ORAL
  Filled 2010-12-21 (×2): qty 1

## 2010-12-21 MED ORDER — AMLODIPINE BESYLATE 2.5 MG PO TABS
2.5000 mg | ORAL_TABLET | Freq: Every day | ORAL | Status: DC
  Administered 2010-12-22: 2.5 mg via ORAL
  Filled 2010-12-21: qty 1

## 2010-12-21 MED ORDER — FENTANYL CITRATE 0.05 MG/ML IJ SOLN
INTRAMUSCULAR | Status: AC
  Filled 2010-12-21: qty 2

## 2010-12-21 MED ORDER — DUTASTERIDE 0.5 MG PO CAPS
0.5000 mg | ORAL_CAPSULE | Freq: Every day | ORAL | Status: DC
  Administered 2010-12-22: 0.5 mg via ORAL
  Filled 2010-12-21 (×2): qty 1

## 2010-12-21 MED ORDER — ZOLPIDEM TARTRATE 5 MG PO TABS: 5.0000 mg | ORAL_TABLET | Freq: Every day | ORAL | Status: DC

## 2010-12-21 MED ORDER — POLYETHYLENE GLYCOL 3350 17 G PO PACK
17.0000 g | PACK | Freq: Every day | ORAL | Status: DC
  Administered 2010-12-21: 17 g via ORAL
  Filled 2010-12-21 (×2): qty 1

## 2010-12-21 MED ORDER — LACTATED RINGERS IV SOLN: INTRAVENOUS | Status: DC

## 2010-12-21 MED ORDER — PROPOFOL 10 MG/ML IV EMUL
INTRAVENOUS | Status: DC | PRN
  Administered 2010-12-21: 150 mg via INTRAVENOUS

## 2010-12-21 MED ORDER — BELLADONNA ALKALOIDS-OPIUM 16.2-60 MG RE SUPP: 1.0000 | Freq: Four times a day (QID) | RECTAL | Status: DC | PRN

## 2010-12-21 MED ORDER — LIDOCAINE HCL 1 % IJ SOLN
INTRAMUSCULAR | Status: DC | PRN
  Administered 2010-12-21: 60 mg via INTRADERMAL

## 2010-12-21 MED ORDER — DEXAMETHASONE SODIUM PHOSPHATE 4 MG/ML IJ SOLN
8.0000 mg | Freq: Once | INTRAMUSCULAR | Status: DC | PRN
  Filled 2010-12-21: qty 2

## 2010-12-21 SURGICAL SUPPLY — 31 items
BAG URINE DRAINAGE (UROLOGICAL SUPPLIES) ×2 IMPLANT
BAG URO CATCHER STRL LF (DRAPE) ×2 IMPLANT
BLADE SURG 15 STRL LF DISP TIS (BLADE) IMPLANT
BLADE SURG 15 STRL SS (BLADE)
CATH COUDE 24FR 5CC (CATHETERS) ×1 IMPLANT
CATH RIBBED COUDE  30CC (CATHETERS) ×1
CLOTH BEACON ORANGE TIMEOUT ST (SAFETY) ×2 IMPLANT
DRAPE CAMERA CLOSED 9X96 (DRAPES) ×2 IMPLANT
ELECT LOOP MED HF 24F 12D (CUTTING LOOP) ×2 IMPLANT
ELECT REM PT RETURN 9FT ADLT (ELECTROSURGICAL)
ELECT RESECT VAPORIZE 12D CBL (ELECTRODE) ×2 IMPLANT
ELECTRODE REM PT RTRN 9FT ADLT (ELECTROSURGICAL) IMPLANT
GLOVE BIOGEL M 7.0 STRL (GLOVE) ×4 IMPLANT
GLOVE BIOGEL PI IND STRL 7.5 (GLOVE) IMPLANT
GLOVE BIOGEL PI INDICATOR 7.5 (GLOVE)
GLOVE ECLIPSE 7.5 STRL STRAW (GLOVE) IMPLANT
GOWN PREVENTION PLUS XLARGE (GOWN DISPOSABLE) ×2 IMPLANT
GOWN STRL NON-REIN LRG LVL3 (GOWN DISPOSABLE) ×2 IMPLANT
GUIDEWIRE STR DUAL SENSOR (WIRE) IMPLANT
HOLDER FOLEY CATH W/STRAP (MISCELLANEOUS) IMPLANT
IV NS IRRIG 3000ML ARTHROMATIC (IV SOLUTION) ×12 IMPLANT
KIT ASPIRATION TUBING (SET/KITS/TRAYS/PACK) ×2 IMPLANT
KIT SUPRAPUBIC CATH (MISCELLANEOUS) IMPLANT
MANIFOLD NEPTUNE II (INSTRUMENTS) ×2 IMPLANT
NEEDLE SPNL 18GX3.5 QUINCKE PK (NEEDLE) IMPLANT
NS IRRIG 1000ML POUR BTL (IV SOLUTION) ×2 IMPLANT
PACK CYSTO (CUSTOM PROCEDURE TRAY) ×2 IMPLANT
SUT ETHILON 3 0 PS 1 (SUTURE) IMPLANT
SYR 30ML LL (SYRINGE) ×2 IMPLANT
SYRINGE IRR TOOMEY STRL 70CC (SYRINGE) ×2 IMPLANT
TUBING CONNECTING 10 (TUBING) ×2 IMPLANT

## 2010-12-21 NOTE — Progress Notes (Signed)
GU  Patient tolerated TURP well.  Complains of catheter discomfort.  Filed Vitals:   12/21/10 1812 12/21/10 1841 12/21/10 1857 12/21/10 1900  BP:  195/76 187/20   Pulse: 54 59    Temp:  97.7 F (36.5 C)    TempSrc:      Resp: 10 16    Height:    5\' 7"  (1.702 m)  Weight:    77.7 kg (171 lb 4.8 oz)  SpO2: 100% 100%     Gen: NAD Abdomen: Soft, No rebound TTP GU:  Catheter draining clear light pink fluid on minimal CBI  A/P:  TURP today -Continue CBI tonight. -Likely discharge home tomorrow.  Natalia Leatherwood, MD [phone]

## 2010-12-21 NOTE — Anesthesia Preprocedure Evaluation (Addendum)
Anesthesia Evaluation  Patient identified by MRN, date of birth, ID band Patient awake    Reviewed: Allergy & Precautions, H&P , NPO status , Patient's Chart, lab work & pertinent test results, reviewed documented beta blocker date and time   History of Anesthesia Complications (+) PONV  Airway Mallampati: II TM Distance: >3 FB Neck ROM: Full    Dental No notable dental hx. (+) Teeth Intact and Caps   Pulmonary neg pulmonary ROS,  clear to auscultation  Pulmonary exam normal       Cardiovascular hypertension, Pt. on home beta blockers + CAD and + Cardiac Stents Regular Normal History of Afib   Neuro/Psych TIA x2- 1st 15 years ago, last 8 years ago; no residual effects TIANegative Psych ROS   GI/Hepatic negative GI ROS, Neg liver ROS,   Endo/Other  Negative Endocrine ROS  Renal/GU negative Renal ROS   BPH    Musculoskeletal negative musculoskeletal ROS (+)   Abdominal   Peds  Hematology negative hematology ROS (+)   Anesthesia Other Findings   Reproductive/Obstetrics negative OB ROS                        Anesthesia Physical Anesthesia Plan  ASA: III  Anesthesia Plan: General   Post-op Pain Management:    Induction:   Airway Management Planned: LMA  Additional Equipment:   Intra-op Plan:   Post-operative Plan:   Informed Consent: I have reviewed the patients History and Physical, chart, labs and discussed the procedure including the risks, benefits and alternatives for the proposed anesthesia with the patient or authorized representative who has indicated his/her understanding and acceptance.   Dental advisory given  Plan Discussed with: CRNA and Anesthesiologist  Anesthesia Plan Comments:         Anesthesia Quick Evaluation

## 2010-12-21 NOTE — Transfer of Care (Signed)
Immediate Anesthesia Transfer of Care Note  Patient: Henry Alvarez  Procedure(s) Performed:  TRANSURETHRAL PROSTATECTOMY WITH GYRUS INSTRUMENTS - PK GYRUS, PLASMA BUTTON AND LOOP; CYSTOSCOPY  Patient Location: PACU  Anesthesia Type: General  Level of Consciousness: awake, oriented, patient cooperative and responds to stimulation  Airway & Oxygen Therapy: Patient Spontanous Breathing and Patient connected to T-piece oxygen  Post-op Assessment: Report given to PACU RN, Post -op Vital signs reviewed and stable and Patient moving all extremities X 4  Post vital signs: Reviewed and stable  Complications: No apparent anesthesia complications

## 2010-12-21 NOTE — Anesthesia Procedure Notes (Signed)
Procedure Name: LMA Insertion Date/Time: 12/21/2010 2:31 PM Performed by: [place], Kamylah Manzo C Pre-anesthesia Checklist: Patient identified, Emergency Drugs available, Suction available and Patient being monitored Patient Re-evaluated:Patient Re-evaluated prior to inductionPreoxygenation: Pre-oxygenation with 100% oxygen Intubation Type: IV induction LMA: LMA inserted and LMA with gastric port inserted LMA Size: 4.0 Number of attempts: 1 Placement Confirmation: positive ETCO2,  CO2 detector and breath sounds checked- equal and bilateral Tube secured with: Tape Dental Injury: Teeth and Oropharynx as per pre-operative assessment

## 2010-12-21 NOTE — Anesthesia Postprocedure Evaluation (Signed)
  Anesthesia Post-op Note  Patient: Henry Alvarez  Procedure(s) Performed:  TRANSURETHRAL PROSTATECTOMY WITH GYRUS INSTRUMENTS - PK GYRUS, PLASMA BUTTON AND LOOP; CYSTOSCOPY  Patient Location: PACU  Anesthesia Type: General  Level of Consciousness: awake and alert   Airway and Oxygen Therapy: Patient Spontanous Breathing  Post-op Pain: mild  Post-op Assessment: Post-op Vital signs reviewed, Patient's Cardiovascular Status Stable, Respiratory Function Stable, Patent Airway and No signs of Nausea or vomiting  Post-op Vital Signs: stable  Complications: No apparent anesthesia complications

## 2010-12-21 NOTE — Op Note (Signed)
NAME:  Henry Alvarez, Henry Alvarez NO.:  [id number]  MEDICAL RECORD NO.:  [id number]  LOCATION:  [facility]                         FACILITY:  [facility]  PHYSICIAN:  Natalia Leatherwood, MD    DATE OF BIRTH:  [birth year]  DATE OF PROCEDURE:  12/21/2010 DATE OF DISCHARGE:                              OPERATIVE REPORT   SURGEON:  Natalia Leatherwood, MD.  ASSISTANT:  None.  PREOPERATIVE DIAGNOSES:  Enlarged prostate, benign prostatic hypertrophy with urinary retention.  POSTOPERATIVE DIAGNOSES:  Enlarged prostate, benign prostatic hypertrophy with urinary retention.  PROCEDURES PERFORMED:  Cystoscopy and transurethral resection of the prostate.  ESTIMATED BLOOD LOSS:  100 mL.  SPECIMEN:  TURP chips sent for pathology.  COMPLICATIONS:  None.  FINDINGS:  Large obstructing prostate.  HISTORY OF PRESENT ILLNESS:  This is an 77 year old gentleman who has urinary retention.  After extensive evaluation and counseling, the patient elected to have a TURP for his obstructing prostate.  He presented today for that elective procedure.  PROCEDURE IN DETAIL:  After informed consent was obtained, the patient was taken to the operating room, where he was placed in supine position. IV antibiotics were infused and general anesthesia was induced.  He was then placed in a dorsal lithotomy position, where his pertinent neurovascular pressure points were padded appropriately.  Following this, a time-out was performed in which the correct patient, surgical site, and procedure were identified and agreed upon by the team.  His genitals were prepped and draped in usual sterile fashion, and then, a visual obturator to the Gyrus resectoscope was placed through the urethra into the bladder.  The ureteral orifices were identified bilaterally and seen to be well away from the prostate.  Resection was carried out by making a groove at the 12 o'clock position at the bladder neck and carrying it down to the  verumontanum staying proximal to the urethral sphincter.  After this was done, dissection was carried down to the bladder neck and prostatic capsule, and then carried up laterally on the left side.  This was resected down to the prostatic capsule and distally making sure to stay proximal to the urethral sphincter.  The same was carried down on the right side to the prostatic capsule, making sure to stay proximal to the urinary sphincter.  The loop resection device and the plasma button were used for this resection. After this was done, hemostasis was obtained with electrocautery, and all prostate chips were removed from the bladder and sent for permanent pathology.  After this was done, a 24-French 3-way hematuria catheter was placed through the urethra into the bladder with 30 cc of sterile water placed into the balloon.  This was placed on mild traction and irrigated light pink.  It was connected to continuous bladder irrigation, and this completed the procedure.  The patient did have a B and O suppository inserted into his rectum prior to beginning the procedure.  He was placed back in the supine position.  Anesthesia was reversed and taken to the PACU in stable condition.  The patient will be kept overnight for 23-hour observation and continuous bladder irrigation.          ______________________________ Natalia Leatherwood, MD  DW/MEDQ  D:  12/21/2010  T:  12/21/2010  Job:  [id number]

## 2010-12-21 NOTE — H&P (Signed)
Chief Complaint  Urinary retention  BPH   History of Present Illness         This is an 77 year old patient who had lumbar surgery for compression fractures on October 05, 2010.  The night of surgery he was unable to enter his bladder and a Foley catheter was placed. He was discharged home from the hospital and he had resumed his Flomax. He states he is never had a catheter placed for urinary retention before that he has had a small stream in the past. Prior to catheter placement he had urinary frequency. He denies gross hematuria.     This is an 77 year old patient who had lumbar surgery (kyphoplasty) for compression fractures on October 05, 2010.  The night of surgery he was unable to empty his bladder and a Foley catheter was placed. He was discharged home from the hospital with the foley catheter and resumed his Flomax. He states he has never had an episode of urinary retention previously but that he has had a small stream in the past and had been placed on Flomax for this by his PCP, Dr. Evlyn Kanner. Prior to catheter placement, he had dysuria, urinary frequency and urgency with small volume dribbly voids but denies gross hematuria. Urine C&S was negative at that time.  The foley catheter was removed in the office 10/11/10 following a successful voiding trial.  He was initially voiding well at home with increased frequency and urgency but decent volume of urine with each void.  Around 4am 10/12/10 he began to notice a significant weakening of his stream and eventually returned to a frequent dribble despite strong urge to void.  He developed suprapubic pressure and therefore presented to [facility] for evaluation.  A foley catheter was replaced and by report approximately urine was drained from the bladder.  He was discharged with indwelling foley connected to leg bag which he is tolerating well and this has continued to drain adequately.  Urinalysis at [facility] 10/12/10 showed bactriuria and a urine C&S was sent.  He  was started on Keflex 250mg  po BID.   The patient has urinary retention. He previously passed a voiding trial but required catheterization within 24 hours. He subsequently has undergone urodynamics and follows up today for review those urodynamics. Today high and evaluated urodynamic tracings and have interpreted these findings.  On the cystometrogram the patient had a capacity of 484 mL with an end filling pressure of 17 cm of water. He had a minimally hyposensitive bladder. He had a strong desire to void at 392 mL.   The patient did not leak with Valsalva, though his maximum abdominal pressure was 55 cm of water.  On pressure flow studies he was unable to void and his maximum pressure was 46 cm of water.  His electromyogram of the sphincter was normal.  On fluoroscopy his bladder neck was closed.    I do not recommend doing nothing as he will continue to have retention which could damage his kidneys and result in a serious infection. Intermittent catheterization is unlikely to be possible as the patient is not able to do so for himself he does not have the social support to be able to do this. Indwelling catheter is less than ideal due to the discomfort and risk of urinary tract infection. His best option is likely transurethral resection of the prostate. It is difficult to tell on the urodynamics whether his bladder generates a strong contraction to be able to void if his prostate were  unobstructed. He has failed the use of an alpha-blocker which would traditionally be used in this setting. I explained that his bladder has likely been damaged from years of pushing against an enlarged prostate and at the bladder muscle appears to be getting out (much like the model seen in congestive heart failure in patients with systemic hypertension).  I explained that there were alternatives for surgery his that range from least invasive to more invasive which include cool thermotherapy, TUIP, and tuna. Based  on the level of obstruction I would not recommend any of these procedures as I feel their efficacy is not great enough.  Today I explained the risks of the procedure which include, but are not limited to, heart attack, stroke, pulmonary embolus, pneumonia, death, positioning injury, permanent or temporary neuropathy, bleeding, infection, need for further procedure, damage to contiguous structures, need for staged procedure, continued urinary retention, urinary incontinence, and need for prolonged catheterization.  Patient returns to the office for discuss regarding TURP.  We again reviewed the same risk and benefits and management options as in his previous visit. We discussed continued urinary retention, urinary incontinence, and in no though lower urinary tract symptoms such as frequency, urgency, and dysuria. They voiced understanding of all of these things. He had the opportunity ask questions and these were answered. I recommended we perform an office cystoscopy to get an idea of his intra-urethra prosthetic size. We discussed the possible need for a staged procedure. I have explained he will have to go home with a catheter in place.  Cystoscopy showed a 4-1/2 cm prostatic urethra. There were bilaterally laterally obstructing lobes of the prostate and what appeared to be a high riding bladder neck but not necessarily an enlarged median lobe. Both of his ureters seemed to be well away from the prostate and bladder neck. I encouraged him to continue his Avodart as this will decrease his risk of bleeding perioperatively.   This patient has been cleared to be off his Coumadin by his cardiologist, Dr. Peter Swaziland, with [facility]. He has atrial fibrillation.  Today the patient had the opportunity to ask questions to his stated satisfaction. We discussed the expected postoperative course and recovery. He understands he will be going home with the catheter in place. Will plan on proceeding with  cystoscopy and transurethral resection of prostate.  We reviewed the risks, benefits, alternatives, and likelihood of achieving his goals. He has taken his antibiotic.  He has stopped his coumadin (7 days) and celebrex (4 days).     Past Medical History Problems  1. History of  Arthritis V13.4 2. History of  Atrial Fibrillation 427.31 3. History of  Heart Disease 429.9 4. History of  Hypercholesterolemia 272.0 5. History of  Hypertension 401.9 6. History of  Stroke Syndrome 436  Surgical History Problems  1. History of  Back Surgery 2. History of  Knee Surgery Right Right  Current Meds 1. Ambien 5 MG Oral Tablet; Therapy: (Recorded:04Sep2012) to 2. Avodart 0.5 MG Oral Capsule; TAKE 1 CAPSULE Daily; Therapy: 04Sep2012 to  (Evaluate:30Aug2013)  Requested for: 04Sep2012; Last Rx:04Sep2012 3. CeleBREX CAPS; Therapy: (Recorded:04Sep2012) to 4. Coumadin TABS; Therapy: (Recorded:04Sep2012) to 5. Flomax CAPS; Therapy: (Recorded:04Sep2012) to 6. Keflex TABS; Therapy: (Recorded:06Sep2012) to 7. Levofloxacin 500 MG Oral Tablet; TAKE 1 TABLET Daily; Therapy: 24Sep2012 to  (Evaluate:08Oct2012)  Requested for: 24Sep2012; Last Rx:24Sep2012 8. Lipitor 80 MG Oral Tablet; Therapy: (Recorded:04Sep2012) to 9. Norvasc TABS; Therapy: (Recorded:04Sep2012) to  Allergies Medication  1. No Known Drug Allergies  Family History Problems  1. Paternal history of  Death In The Family Father passed at age 56 from heart issues 2. Paternal history of  Death In The Family Mother passed at age 17 from heart issues 3. Paternal history of  Family Health Status Number Of Children 1 son and 1 daughter 4. Paternal history of  Kidney Cancer V16.51  Social History Problems  1. Caffeine Use 2 glasses per day 2. Marital History - Currently Married 3. Never A Smoker 4. Occupation: Retired Nurse, children's  5. History of  Alcohol Use  Review of Systems Skin, otolaryngeal, cardiovascular, endocrine,  gastrointestinal and neurological system(s) were reviewed and pertinent findings if present are noted.  Gastrointestinal: no nausea, no vomiting and no constipation.  Constitutional: recent ~Ulb weight loss, but no fever, no night sweats and not feeling tired (fatigue).  Hematologic/Lymphatic: a tendency to easily bruise, but no swollen glands.  Respiratory: cough, but no shortness of breath.  Musculoskeletal: back pain and joint pain.  Psychiatric: no anxiety and no depression.     Physical Exam Constitutional: Well nourished and well developed.  ENT:. The ears and nose are normal in appearance. The oropharynx is normal.  Neck: The appearance of the neck is normal and no neck mass is present.  Pulmonary: No respiratory distress and normal respiratory rhythm and effort.  Cardiovascular: Heart rate and rhythm are normal . No peripheral edema.  Abdomen: The abdomen is soft and nontender. No masses are palpated. The abdomen is no rebound. No CVA tenderness.  Genitourinary: Examination of the penis demonstrates an indwelling catheter.  Lymphatics: The posterior cervical and supraclavicular nodes are not enlarged or tender.  Skin: Normal skin turgor and no visible rash.  Neuro/Psych:. Mood and affect are appropriate. No focal sensory deficits.    Assessment   Urinary retention Obstructing prostate  Plan   To the OR for transurethral resection of prostate.  Vancomycin on call to OR according to urinary culture.

## 2010-12-21 NOTE — Brief Op Note (Signed)
12/21/2010  3:50 PM  PATIENT:  Henry Alvarez  77 y.o. male  PRE-OPERATIVE DIAGNOSIS:  benign prostate hyperplasia with urinary retention  POST-OPERATIVE DIAGNOSIS:  * No post-op diagnosis entered *  PROCEDURE:  Procedure(s): TRANSURETHRAL PROSTATECTOMY WITH GYRUS INSTRUMENTS CYSTOSCOPY  SURGEON:  Surgeon(s): Milford Cage, MD  PHYSICIAN ASSISTANT:   ASSISTANTS: none   ANESTHESIA:   general  EBL:  Total I/O In: 1000 [I.V.:1000] Out: 100 [Blood:100]  BLOOD ADMINISTERED:none  DRAINS: Urinary Catheter (Foley)   LOCAL MEDICATIONS USED:  NONE  SPECIMEN:  Source of Specimen:  TURP chips.  DISPOSITION OF SPECIMEN:  PATHOLOGY  COUNTS:  YES  TOURNIQUET:  * No tourniquets in log *  DICTATION: .Other Dictation: Dictation Number [id number]  PLAN OF CARE: Admit for overnight observation  PATIENT DISPOSITION:  PACU - hemodynamically stable.   Delay start of Pharmacological VTE agent (>24hrs) due to surgical blood loss or risk of bleeding:  {YES/NO/NOT APPLICABLE:20182

## 2010-12-22 MED ORDER — THERA M PLUS PO TABS: 1.0000 | ORAL_TABLET | Freq: Every day | ORAL | Status: DC

## 2010-12-22 MED ORDER — WARFARIN SODIUM 6 MG PO TABS: 6.0000 mg | ORAL_TABLET | Freq: Every evening | ORAL | Status: DC

## 2010-12-22 MED ORDER — POLYETHYLENE GLYCOL 3350 17 G PO PACK: 17.0000 g | PACK | Freq: Every day | ORAL | Status: AC

## 2010-12-22 MED ORDER — OXYCODONE HCL 5 MG PO TABS: 5.0000 mg | ORAL_TABLET | ORAL | Status: AC | PRN

## 2010-12-22 MED ORDER — BACITRACIN-NEOMYCIN-POLYMYXIN 400-5-5000 EX OINT: 1.0000 "application " | TOPICAL_OINTMENT | Freq: Three times a day (TID) | CUTANEOUS | Status: AC | PRN

## 2010-12-22 MED ORDER — CELECOXIB 200 MG PO CAPS: 200.0000 mg | ORAL_CAPSULE | Freq: Every day | ORAL | Status: DC

## 2010-12-22 MED ORDER — SENNOSIDES-DOCUSATE SODIUM 8.6-50 MG PO TABS: 1.0000 | ORAL_TABLET | Freq: Two times a day (BID) | ORAL | Status: DC

## 2010-12-22 MED ORDER — BELLADONNA ALKALOIDS-OPIUM 16.2-60 MG RE SUPP: 1.0000 | Freq: Four times a day (QID) | RECTAL | Status: AC | PRN

## 2010-12-22 NOTE — Progress Notes (Signed)
Urology Progress Note  Subjective:     No acute urologic events overnight.  CBI turned off; urine remained moderate transparent pink for an hour without CBI and patient was able to transfer to chair without change in color of urine. Ambulation:  [  ] Positive  [ x ] Negative Pain: [ x ] Well controlled.  [  ] Needs adjustment.  Objective:  Patient Vitals for the past 24 hrs:  BP Temp Temp src Pulse Resp SpO2 Height Weight  12/22/10 0605 135/62 mmHg 97.4 F (36.3 C) Oral 49  14  100 % - -  12/22/10 0148 141/68 mmHg 98.5 F (36.9 C) Oral 43  16  100 % - -  12/21/10 2154 171/79 mmHg 98.4 F (36.9 C) Axillary 60  16  100 % - -  12/21/10 1900 - - - - - - 5\' 7"  (1.702 m) 77.7 kg (171 lb 4.8 oz)  12/21/10 1857 187/20 mmHg - - - - - - -  12/21/10 1841 195/76 mmHg 97.7 F (36.5 C) - 59  16  100 % - -  12/21/10 1812 - - - 54  10  100 % - -  12/21/10 1811 - - - 54  13  100 % - -  12/21/10 1810 - - - 54  14  100 % - -  12/21/10 1809 - - - 58  15  100 % - -  12/21/10 1808 - - - 57  14  100 % - -  12/21/10 1807 - - - 58  15  100 % - -  12/21/10 1806 - - - 56  12  100 % - -  12/21/10 1800 183/74 mmHg 97.6 F (36.4 C) - 55  15  100 % - -  12/21/10 1730 154/72 mmHg - - 50  11  100 % - -  12/21/10 1715 167/66 mmHg 97.6 F (36.4 C) - 53  12  100 % - -  12/21/10 1700 160/69 mmHg - - 52  10  100 % - -  12/21/10 1645 173/81 mmHg 96.8 F (36 C) - 55  12  100 % - -  12/21/10 1630 187/78 mmHg - - 58  13  100 % - -  12/21/10 1615 191/88 mmHg 96.8 F (36 C) - 64  14  100 % - -  12/21/10 1600 170/82 mmHg - - 71  - 100 % - -  12/21/10 1558 180/84 mmHg 96.8 F (36 C) - 71  6  100 % - -  12/21/10 1204 188/89 mmHg 97.3 F (36.3 C) Oral 51  16  99 % - -    Physical Exam: General:  No acute distress, awake Cardiovascular:    [x]   S1/S2 present, RRR  []   Irregularly irregular Chest:  CTA-B Abdomen:               []  Soft, appropriately TTP  [x]  Soft, NTTP  []  Soft, appropriately TTP, incision(s)  clean/dry/intact  Genitourinary: Foley draining light pink on slow CBI Foley:  No traction.    I/O last 3 completed shifts: In: 2550 [I.V.:1650; Other:900] Out: 4300 [Urine:4200; Blood:100]  No results found for this basename: HGB:2,WBC:2,PLT:2 in the last 72 hours  Recent Labs  [facility] 12/21/10 1644   NA 138   K 4.0   CL 103   CO2 28   BUN 17   CREATININE 0.83   CALCIUM 9.2   GFRNONAA 79*   GFRAA >90  Recent Labs  [facility] 12/21/10 1230   INR 1.16   APTT 30     No components found with this basename: ABG:2     Assessment: POD#1 TURP Plan: -Turn off CBI. -Up out of bed. -Discharge home this morning.    Natalia Leatherwood, MD [phone]

## 2010-12-22 NOTE — Discharge Summary (Signed)
Physician Discharge Summary  Patient ID: Henry Alvarez MRN: [id number] DOB/AGE: 1924-03-13 77 y.o.  Admit date: 12/21/2010 Discharge date: 12/22/2010  Admission Diagnoses:  Discharge Diagnoses:  Active Problems:  Benign prostate hyperplasia   Discharged Condition: good  Hospital Course: Patient admitted after TURP for continuous bladder irrigation.  His irrigation was able to be tuned off the following morning.  His urine remained adequately clear for discharge home.  Consults: none  Significant Diagnostic Studies: None  Treatments: surgery: TURP.  Discharge Exam: Blood pressure 135/62, pulse 49, temperature 97.4 F (36.3 C), temperature source Oral, resp. rate 14, height 5\' 7"  (1.702 m), weight 77.7 kg (171 lb 4.8 oz), SpO2 100.00%. See progress note from the day of discharge.  Disposition: Home or Self Care  Discharge Orders    Future Orders Please Complete By Expires   Discharge instructions      Comments:   Provide with leg bag and overnight bag.  Discontinue CBI and plug irrigation port of catheter.  Teach foley catheter care.   Discharge patient        Current Discharge Medication List    START taking these medications   Details  neomycin-bacitracin-polymyxin (NEOSPORIN) ointment Apply 1 application topically 3 (three) times daily as needed (catheter irritation). apply to tip of penis Qty: 15 g, Refills: 3    opium-belladonna (B&O SUPPRETTES) 16.2-60 MG suppository Place 1 suppository rectally every 6 (six) hours as needed for pain. Qty: 40 suppository, Refills: 0    oxyCODONE (OXY IR/ROXICODONE) 5 MG immediate release tablet Take 1-2 tablets (5-10 mg total) by mouth every 4 (four) hours as needed. Qty: 50 tablet, Refills: 0    polyethylene glycol (MIRALAX / GLYCOLAX) packet Take 17 g by mouth daily. Qty: 14 each, Refills: 1    senna-docusate (SENOKOT-S) 8.6-50 MG per tablet Take 1 tablet by mouth 2 (two) times daily. Qty: 60 tablet, Refills: 0        CONTINUE these medications which have CHANGED   Details  celecoxib (CELEBREX) 200 MG capsule Take 1 capsule (200 mg total) by mouth daily. Qty: 1 capsule, Refills: 0    Multiple Vitamins-Minerals (MULTIVITAMINS THER. W/MINERALS) TABS Take 1 tablet by mouth daily. Qty: 1 each, Refills: 0    warfarin (COUMADIN) 6 MG tablet Take 1-1.5 tablets (6-9 mg total) by mouth every evening. As directed by the [facility].  Pt takes 6 mg on Monday, Wednesday, and Friday.  All other day pt takes 9 mg.  Qty: 1 tablet, Refills: 0      CONTINUE these medications which have NOT CHANGED   Details  amLODipine (NORVASC) 2.5 MG tablet Take 2.5 mg by mouth every morning.     Ascorbic Acid (VITAMIN C PO) Take 1 tablet by mouth daily.     calcium gluconate 650 MG tablet Take 650 mg by mouth 4 (four) times daily.     dutasteride (AVODART) 0.5 MG capsule Take 0.5 mg by mouth daily.     Tamsulosin HCl (FLOMAX) 0.4 MG CAPS Take 0.4 mg by mouth daily.     zolpidem (AMBIEN) 5 MG tablet Take 5 mg by mouth at bedtime.     atorvastatin (LIPITOR) 80 MG tablet Take 80 mg by mouth every morning.     metoprolol (LOPRESSOR) 50 MG tablet Take 50 mg by mouth as needed. For afib.      STOP taking these medications     doxycycline (VIBRAMYCIN) 100 MG capsule      traMADol-acetaminophen (ULTRACET) 37.5-325 MG per  tablet        Follow-up Information    Follow up with Milford Cage, MD. (12/27/10 at 12:30 pm.)    Contact information:   [address] [facility] [place] [address] [phone]          Signed: Milford Cage 12/22/2010, 7:24 AM

## 2010-12-22 NOTE — Progress Notes (Signed)
  Courtesy visit:  doing well. Chemistries look good. Didn't sleep much. No pain. No other issues. To see me in a month or so INR subtherapeutic, to start back on Rx coumadin

## 2010-12-23 ENCOUNTER — Encounter (HOSPITAL_COMMUNITY): Payer: Self-pay | Admitting: Urology

## 2011-02-09 DIAGNOSIS — M1710 Unilateral primary osteoarthritis, unspecified knee: Secondary | ICD-10-CM | POA: Diagnosis not present

## 2011-02-16 DIAGNOSIS — M1710 Unilateral primary osteoarthritis, unspecified knee: Secondary | ICD-10-CM | POA: Diagnosis not present

## 2011-02-20 DIAGNOSIS — M546 Pain in thoracic spine: Secondary | ICD-10-CM | POA: Diagnosis not present

## 2011-02-23 DIAGNOSIS — M1710 Unilateral primary osteoarthritis, unspecified knee: Secondary | ICD-10-CM | POA: Diagnosis not present

## 2011-03-02 DIAGNOSIS — M1710 Unilateral primary osteoarthritis, unspecified knee: Secondary | ICD-10-CM | POA: Diagnosis not present

## 2011-03-09 DIAGNOSIS — M1710 Unilateral primary osteoarthritis, unspecified knee: Secondary | ICD-10-CM | POA: Diagnosis not present

## 2011-04-18 DIAGNOSIS — M1710 Unilateral primary osteoarthritis, unspecified knee: Secondary | ICD-10-CM | POA: Diagnosis not present

## 2011-05-19 DIAGNOSIS — R351 Nocturia: Secondary | ICD-10-CM | POA: Diagnosis not present

## 2011-05-19 DIAGNOSIS — R350 Frequency of micturition: Secondary | ICD-10-CM | POA: Diagnosis not present

## 2011-05-28 ENCOUNTER — Other Ambulatory Visit: Payer: Self-pay | Admitting: Orthopedic Surgery

## 2011-05-28 MED ORDER — DEXAMETHASONE SODIUM PHOSPHATE 10 MG/ML IJ SOLN: 10.0000 mg | Freq: Once | INTRAMUSCULAR | Status: DC

## 2011-05-28 MED ORDER — BUPIVACAINE 0.25 % ON-Q PUMP SINGLE CATH 300ML: 300.0000 mL | INJECTION | Status: DC

## 2011-05-30 DIAGNOSIS — I4891 Unspecified atrial fibrillation: Secondary | ICD-10-CM | POA: Diagnosis not present

## 2011-05-30 DIAGNOSIS — Z7901 Long term (current) use of anticoagulants: Secondary | ICD-10-CM | POA: Diagnosis not present

## 2011-06-27 DIAGNOSIS — I4891 Unspecified atrial fibrillation: Secondary | ICD-10-CM | POA: Diagnosis not present

## 2011-06-27 DIAGNOSIS — Z7901 Long term (current) use of anticoagulants: Secondary | ICD-10-CM | POA: Diagnosis not present

## 2011-06-28 DIAGNOSIS — L570 Actinic keratosis: Secondary | ICD-10-CM | POA: Diagnosis not present

## 2011-07-12 DIAGNOSIS — I4891 Unspecified atrial fibrillation: Secondary | ICD-10-CM | POA: Diagnosis not present

## 2011-07-12 DIAGNOSIS — Z7901 Long term (current) use of anticoagulants: Secondary | ICD-10-CM | POA: Diagnosis not present

## 2011-07-14 NOTE — H&P (Signed)
Henry Alvarez DOB: [birth year]  Chief complaint: left knee pain  History of Present Illness The patient is a 78 year old male who comes in today for a preoperative History and Physical. The patient is scheduled for a left total knee arthroplasty to be performed by Dr. Gus Rankin. Aluisio, MD at [facility] on Monday August 07, 2011 . The left knee has been bothering him for over a year. He has had cortisone injections and Hyalagan injections that are no longer working. He has knee pain with walking, squatting, and walking up and down stairs. Due to failure of conservative measures, the most predictable means for increased function and decreased pain in the left knee is a left total knee arthroplasty. Risks and benefits of surgery discussed. PCP: Dr. Adrian Prince Cardio: Dr. Peter Swazil[place]    Problem List/Past MedicalHistory Pain, Lumbar (LBP) (724.2) Fracture, Lumbar Vertebrae, closed (805.4) Disorder, shoulder region NEC (726.2). 11/16/1994 Tear, medial meniscus, knee, current (836.0). 11/16/1994 Sprain/strain, rotator cuff (840.4). 02/18/1999 Sprain/strain, knee/leg NOS (844.9). 08/02/2004 Pain in thoracic spine (724.1). 08/03/2010 Skin Cancer Hypertension Osteoarthritis Sprain/strain, knee/leg NOS (844.9). 07/15/2004 Fx closed ankle NOS (824.8). 09/04/2005 Atrial Fibrillation Stent. heart Heart disease Urinary Incontinence   Allergies No Known Drug Allergies.    Family History Heart Disease. grandfather mothers side Heart disease in male family member before age 65 Heart disease in male family member before age 60 Congestive Heart Failure. mother, father and grandmother mothers side Cancer. father Hypertension. grandfather mothers side Osteoporosis. sister Father. deceased age 27 due to CHF Mother. deceased age 55 CHF   Social History Tobacco / smoke exposure. no Pain Contract. no Illicit drug use. no Tobacco use. Never  smoker. never smoker Number of flights of stairs before winded. 2-3 Marital status. married Living situation. live with spouse Drug/Alcohol Rehab (Previously). no Drug/Alcohol Rehab (Currently). no Exercise. Exercises rarely; does other Current work status. retired Copywriter, advertising. 2 Alcohol use. Occasional alcohol use. current drinker; drinks wine; only occasionally per week Post-Surgical Plans. SNF Advance Directives. living will, healthcare POA   Medication History Ultracet (37.5-325MG  Tablet, Oral two times daily, as needed) Active. Ambien (5MG  Tablet, Oral at bedtime) Active. CeleBREX (200MG  Capsule, Oral daily) Active. Norvasc ( Oral daily) Specific dose unknown - Active. Multiple Vitamin ( Oral daily) Active. Calcium Citrate + D ( Oral daily) Specific dose unknown - Active. Coumadin ( Oral daily) Specific dose unknown - Active.   Past Surgical History Prostatectomy; Transurethral. 2012 Total Knee Replacement. Right. 2010 Back Surgery. kyphoplasty TLL, L2 2012 Carpal Tunnel Repair. left 2000 Heart Stents    Review of Systems General:Not Present- Chills, Fever, Night Sweats, Fatigue, Weight Gain, Weight Loss and Memory Loss. Skin:Not Present- Hives, Itching, Rash, Eczema and Lesions. HEENT:Not Present- Tinnitus, Headache, Double Vision, Visual Loss, Hearing Loss and Dentures. Respiratory:Present- Shortness of breath with exertion. Not Present- Shortness of breath at rest, Allergies, Coughing up blood and Chronic Cough. Cardiovascular:Not Present- Chest Pain, Racing/skipping heartbeats, Difficulty Breathing Lying Down, Murmur, Swelling and Palpitations. Gastrointestinal:Not Present- Bloody Stool, Heartburn, Abdominal Pain, Vomiting, Nausea, Constipation, Diarrhea, Difficulty Swallowing, Jaundice and Loss of appetitie. Male Genitourinary:Present- Urinary frequency and Incontinence. Not Present- Blood in Urine, Weak urinary stream, Discharge, Flank Pain,  Painful Urination, Urgency, Urinary Retention and Urinating at Night. Musculoskeletal:Present- Joint Pain, Back Pain and Morning Stiffness. Not Present- Muscle Weakness, Muscle Pain, Joint Swelling and Spasms. Neurological:Not Present- Tremor, Dizziness, Blackout spells, Paralysis, Difficulty with balance and Weakness. Psychiatric:Not Present- Insomnia.   Vitals Weight: 160 lb Height: 67  in Body Surface Area: 1.85 m Body Mass Index: 25.06 kg/m Pulse: 64 (Regular) Resp.: 18 (Unlabored) BP: 132/78 (Sitting, Left Arm, Standard)    Physical Exam General Mental Status - Alert, cooperative and good historian. General Appearance- pleasant. Not in acute distress. Orientation- Oriented X3. Build & Nutrition- Well nourished and Well developed. Head and Neck Head- normocephalic, atraumatic . Neck Global Assessment- supple. no bruit auscultated on the right and no bruit auscultated on the left. Eye Pupil- Bilateral- Normal. Motion- Bilateral- EOMI. Chest and Lung Exam Auscultation: Breath sounds:- clear at anterior chest wall and - clear at posterior chest wall. Adventitious sounds:- No Adventitious sounds. Cardiovascular Auscultation:Rhythm- Regular rate and rhythm. Heart Sounds- S1 WNL and S2 WNL. Murmurs & Other Heart Sounds:Auscultation of the heart reveals - No Murmurs. Abdomen Palpation/Percussion:Tenderness- Abdomen is non-tender to palpation. Rigidity (guarding)- Abdomen is soft. Auscultation:Auscultation of the abdomen reveals - Bowel sounds normal. Male Genitourinary Not done, not pertinent to present illness Peripheral Vascular Upper Extremity: Palpation:- Pulses bilaterally normal. Lower Extremity: Palpation:- Pulses bilaterally normal. Neurologic Examination of related systems reveals - normal muscle strength and tone in all extremities. Neurologic evaluation reveals - normal sensation and upper and lower extremity  deep tendon reflexes intact bilaterally . Musculoskeletal Left knee has no effusion. Range is 5-120. Marked crepitus on ROM. No instability. Right knee range is 0-125. No swelling, tenderness or instability.    Assessment & Plan Osteoarthrosis, knee (715.96) Left total knee arthroplasty     Dimitri Ped, PA-C

## 2011-07-26 ENCOUNTER — Encounter (HOSPITAL_COMMUNITY): Payer: Self-pay | Admitting: Pharmacy Technician

## 2011-07-31 ENCOUNTER — Encounter (HOSPITAL_COMMUNITY)
Admission: RE | Admit: 2011-07-31 | Discharge: 2011-07-31 | Disposition: A | Discharge status: Home / Self Care | Payer: Medicare Other | Source: Ambulatory Visit | Attending: Orthopedic Surgery | Admitting: Orthopedic Surgery

## 2011-07-31 ENCOUNTER — Encounter (HOSPITAL_COMMUNITY): Payer: Self-pay

## 2011-07-31 ENCOUNTER — Ambulatory Visit (HOSPITAL_COMMUNITY)
Admission: RE | Admit: 2011-07-31 | Discharge: 2011-07-31 | Disposition: A | Discharge status: Home / Self Care | Payer: Medicare Other | Source: Ambulatory Visit | Attending: Orthopedic Surgery | Admitting: Orthopedic Surgery

## 2011-07-31 DIAGNOSIS — I4891 Unspecified atrial fibrillation: Secondary | ICD-10-CM | POA: Diagnosis not present

## 2011-07-31 LAB — URINALYSIS, ROUTINE W REFLEX MICROSCOPIC
Nitrite: NEGATIVE
Protein, ur: NEGATIVE mg/dL
Specific Gravity, Urine: 1.034 — ABNORMAL HIGH (ref 1.005–1.030)
Urobilinogen, UA: 0.2 mg/dL (ref 0.0–1.0)

## 2011-07-31 LAB — CBC
MCH: 30.9 pg (ref 26.0–34.0)
MCHC: 32.6 g/dL (ref 30.0–36.0)
Platelets: 119 10*3/uL — ABNORMAL LOW (ref 150–400)
RBC: 3.88 MIL/uL — ABNORMAL LOW (ref 4.22–5.81)
RDW: 13.6 % (ref 11.5–15.5)

## 2011-07-31 LAB — COMPREHENSIVE METABOLIC PANEL
ALT: 13 U/L (ref 0–53)
AST: 28 U/L (ref 0–37)
Albumin: 4.1 g/dL (ref 3.5–5.2)
Calcium: 9.8 mg/dL (ref 8.4–10.5)
Sodium: 140 mEq/L (ref 135–145)
Total Protein: 7.3 g/dL (ref 6.0–8.3)

## 2011-07-31 LAB — SURGICAL PCR SCREEN
MRSA, PCR: NEGATIVE
Staphylococcus aureus: POSITIVE — AB

## 2011-07-31 LAB — PROTIME-INR: INR: 2.12 — ABNORMAL HIGH (ref 0.00–1.49)

## 2011-07-31 NOTE — Patient Instructions (Addendum)
20 Henry Alvarez  07/31/2011   Your procedure is scheduled on:  08/07/11 [id number]  Report to [facility] at 0515 AM.  Call this number if you have problems the morning of surgery: [phone]   Remember:   Do not eat food:After Midnight.  May have clear liquids:until Midnight .    Take these medicines the morning of surgery with A SIP OF WATER:    Do not wear jewelry, .  Do not wear lotions, powders, or perfumes.   Men may shave face and neck.  Do not bring valuables to the hospital.  Contacts, dentures or bridgework may not be worn into surgery.  Leave suitcase in the car. After surgery it may be brought to your room.  For patients admitted to the hospital, checkout time is 11:00 AM the day of discharge.   Special Instructions: CHG Shower Use Special Wash: 1/2 bottle night before surgery and 1/2 bottle morning of surgery. shower chin to toes with CHG.  Wash face and private parts with regular soap.   Please read over the following fact sheets that you were given: MRSA Information, coughing and deep breathing exercises, leg exercises, Blood Transfusion Fact Sheet , incentive Spirometry fact Sheet

## 2011-07-31 NOTE — Progress Notes (Signed)
07/31/11 1448  OBSTRUCTIVE SLEEP APNEA  Have you ever been diagnosed with sleep apnea through a sleep study? No  Do you snore loudly (loud enough to be heard through closed doors)?  0  Do you often feel tired, fatigued, or sleepy during the daytime? 1  Has anyone observed you stop breathing during your sleep? 0  Do you have, or are you being treated for high blood pressure? 1  BMI more than 35 kg/m2? 0  Age over 76 years old? 1  Neck circumference greater than 40 cm/18 inches? 0  Gender: 1  Obstructive Sleep Apnea Score 4   Score 4 or greater  Updated health history

## 2011-07-31 NOTE — Pre-Procedure Instructions (Signed)
07/31/11 Abnormal platelet count result , issue of pt with cough and difficulty swallowing faxed and confirmation received to Dr Lequita Halt.

## 2011-07-31 NOTE — Pre-Procedure Instructions (Signed)
Clearance note on chart from Dr P Swaz[place]  LOV with Dr P Swaziland on chart- 08/16/10  CXR 09/26/10 on chart  08/16/10 EKG on chart  ECHO 07/19/11 on chart

## 2011-08-07 ENCOUNTER — Encounter (HOSPITAL_COMMUNITY): Payer: Self-pay | Admitting: Anesthesiology

## 2011-08-07 ENCOUNTER — Ambulatory Visit (HOSPITAL_COMMUNITY): Payer: Medicare Other | Admitting: Anesthesiology

## 2011-08-07 ENCOUNTER — Encounter (HOSPITAL_COMMUNITY): Payer: Self-pay

## 2011-08-07 ENCOUNTER — Inpatient Hospital Stay (HOSPITAL_COMMUNITY)
Admission: RE | Admit: 2011-08-07 | Discharge: 2011-08-10 | DRG: 470 | Disposition: A | Discharge status: Skilled Nursing Facility | Payer: Medicare Other | Source: Ambulatory Visit | Attending: Orthopedic Surgery | Admitting: Orthopedic Surgery

## 2011-08-07 ENCOUNTER — Encounter (HOSPITAL_COMMUNITY)
Admission: RE | Disposition: A | Discharge status: Skilled Nursing Facility | Payer: Self-pay | Source: Ambulatory Visit | Attending: Orthopedic Surgery

## 2011-08-07 ENCOUNTER — Encounter (HOSPITAL_COMMUNITY): Payer: Self-pay | Admitting: Orthopedic Surgery

## 2011-08-07 DIAGNOSIS — M1710 Unilateral primary osteoarthritis, unspecified knee: Secondary | ICD-10-CM | POA: Diagnosis not present

## 2011-08-07 DIAGNOSIS — N400 Enlarged prostate without lower urinary tract symptoms: Secondary | ICD-10-CM | POA: Diagnosis present

## 2011-08-07 DIAGNOSIS — E871 Hypo-osmolality and hyponatremia: Secondary | ICD-10-CM | POA: Diagnosis not present

## 2011-08-07 DIAGNOSIS — E785 Hyperlipidemia, unspecified: Secondary | ICD-10-CM | POA: Diagnosis not present

## 2011-08-07 DIAGNOSIS — N189 Chronic kidney disease, unspecified: Secondary | ICD-10-CM | POA: Diagnosis present

## 2011-08-07 DIAGNOSIS — D649 Anemia, unspecified: Secondary | ICD-10-CM | POA: Diagnosis not present

## 2011-08-07 DIAGNOSIS — I2510 Atherosclerotic heart disease of native coronary artery without angina pectoris: Secondary | ICD-10-CM | POA: Diagnosis not present

## 2011-08-07 DIAGNOSIS — Z7901 Long term (current) use of anticoagulants: Secondary | ICD-10-CM | POA: Diagnosis not present

## 2011-08-07 DIAGNOSIS — F99 Mental disorder, not otherwise specified: Secondary | ICD-10-CM | POA: Diagnosis not present

## 2011-08-07 DIAGNOSIS — M25569 Pain in unspecified knee: Secondary | ICD-10-CM | POA: Diagnosis not present

## 2011-08-07 DIAGNOSIS — D62 Acute posthemorrhagic anemia: Secondary | ICD-10-CM | POA: Diagnosis not present

## 2011-08-07 DIAGNOSIS — S8990XA Unspecified injury of unspecified lower leg, initial encounter: Secondary | ICD-10-CM | POA: Diagnosis not present

## 2011-08-07 DIAGNOSIS — Z96659 Presence of unspecified artificial knee joint: Secondary | ICD-10-CM | POA: Diagnosis not present

## 2011-08-07 DIAGNOSIS — I4891 Unspecified atrial fibrillation: Secondary | ICD-10-CM | POA: Diagnosis present

## 2011-08-07 DIAGNOSIS — I129 Hypertensive chronic kidney disease with stage 1 through stage 4 chronic kidney disease, or unspecified chronic kidney disease: Secondary | ICD-10-CM | POA: Diagnosis present

## 2011-08-07 DIAGNOSIS — IMO0002 Reserved for concepts with insufficient information to code with codable children: Secondary | ICD-10-CM | POA: Diagnosis not present

## 2011-08-07 DIAGNOSIS — I10 Essential (primary) hypertension: Secondary | ICD-10-CM | POA: Diagnosis not present

## 2011-08-07 DIAGNOSIS — Z5189 Encounter for other specified aftercare: Secondary | ICD-10-CM | POA: Diagnosis not present

## 2011-08-07 HISTORY — PX: TOTAL KNEE ARTHROPLASTY: SHX125

## 2011-08-07 LAB — PROTIME-INR
INR: 1.19 (ref 0.00–1.49)
Prothrombin Time: 15.4 seconds — ABNORMAL HIGH (ref 11.6–15.2)

## 2011-08-07 SURGERY — ARTHROPLASTY, KNEE, TOTAL
Anesthesia: Spinal | Site: Knee | Laterality: Left | Wound class: Clean

## 2011-08-07 MED ORDER — OXYCODONE HCL 5 MG PO TABS
5.0000 mg | ORAL_TABLET | ORAL | Status: DC | PRN
  Administered 2011-08-07 – 2011-08-08 (×3): 5 mg via ORAL
  Filled 2011-08-07: qty 2
  Filled 2011-08-07 (×2): qty 1

## 2011-08-07 MED ORDER — ATROPINE SULFATE 0.4 MG/ML IJ SOLN
INTRAMUSCULAR | Status: DC | PRN
  Administered 2011-08-07 (×2): 0.4 mg via INTRAVENOUS

## 2011-08-07 MED ORDER — PROPOFOL 10 MG/ML IV BOLUS
INTRAVENOUS | Status: DC | PRN
  Administered 2011-08-07 (×5): 20 mg via INTRAVENOUS
  Administered 2011-08-07: 10 mg via INTRAVENOUS

## 2011-08-07 MED ORDER — CEFAZOLIN SODIUM-DEXTROSE 2-3 GM-% IV SOLR
2.0000 g | INTRAVENOUS | Status: AC
  Administered 2011-08-07: 2 g via INTRAVENOUS

## 2011-08-07 MED ORDER — GLYCOPYRROLATE 0.2 MG/ML IJ SOLN
INTRAMUSCULAR | Status: DC | PRN
  Administered 2011-08-07 (×2): 0.2 mg via INTRAVENOUS

## 2011-08-07 MED ORDER — KCL IN DEXTROSE-NACL 20-5-0.9 MEQ/L-%-% IV SOLN
INTRAVENOUS | Status: DC
  Administered 2011-08-07 – 2011-08-08 (×2): via INTRAVENOUS
  Filled 2011-08-07 (×4): qty 1000

## 2011-08-07 MED ORDER — BUPIVACAINE 0.25 % ON-Q PUMP SINGLE CATH 300ML
INJECTION | Status: AC
  Filled 2011-08-07: qty 300

## 2011-08-07 MED ORDER — SODIUM CHLORIDE 0.9 % IR SOLN
Status: DC | PRN
  Administered 2011-08-07: 1000 mL

## 2011-08-07 MED ORDER — METOPROLOL TARTRATE 50 MG PO TABS
50.0000 mg | ORAL_TABLET | ORAL | Status: DC | PRN
  Filled 2011-08-07: qty 1

## 2011-08-07 MED ORDER — ACETAMINOPHEN 650 MG RE SUPP: 650.0000 mg | Freq: Four times a day (QID) | RECTAL | Status: DC | PRN

## 2011-08-07 MED ORDER — METHOCARBAMOL 500 MG PO TABS
500.0000 mg | ORAL_TABLET | Freq: Four times a day (QID) | ORAL | Status: DC | PRN
  Administered 2011-08-08 – 2011-08-10 (×4): 500 mg via ORAL
  Filled 2011-08-07 (×4): qty 1

## 2011-08-07 MED ORDER — ACETAMINOPHEN 325 MG PO TABS
650.0000 mg | ORAL_TABLET | Freq: Four times a day (QID) | ORAL | Status: DC | PRN
  Administered 2011-08-08 – 2011-08-09 (×2): 650 mg via ORAL
  Filled 2011-08-07 (×2): qty 2

## 2011-08-07 MED ORDER — SODIUM CHLORIDE 0.9 % IV SOLN: INTRAVENOUS | Status: DC

## 2011-08-07 MED ORDER — ONDANSETRON HCL 4 MG/2ML IJ SOLN: 4.0000 mg | Freq: Four times a day (QID) | INTRAMUSCULAR | Status: DC | PRN

## 2011-08-07 MED ORDER — CHLORHEXIDINE GLUCONATE 4 % EX LIQD
60.0000 mL | Freq: Once | CUTANEOUS | Status: DC
  Filled 2011-08-07: qty 60

## 2011-08-07 MED ORDER — FENTANYL CITRATE 0.05 MG/ML IJ SOLN
INTRAMUSCULAR | Status: DC | PRN
  Administered 2011-08-07: 25 ug via INTRAVENOUS

## 2011-08-07 MED ORDER — POLYETHYLENE GLYCOL 3350 17 G PO PACK: 17.0000 g | PACK | Freq: Every day | ORAL | Status: DC | PRN

## 2011-08-07 MED ORDER — METHOCARBAMOL 100 MG/ML IJ SOLN
500.0000 mg | Freq: Four times a day (QID) | INTRAVENOUS | Status: DC | PRN
  Administered 2011-08-07: 500 mg via INTRAVENOUS
  Filled 2011-08-07: qty 5

## 2011-08-07 MED ORDER — METOCLOPRAMIDE HCL 10 MG PO TABS: 5.0000 mg | ORAL_TABLET | Freq: Three times a day (TID) | ORAL | Status: DC | PRN

## 2011-08-07 MED ORDER — LACTATED RINGERS IV SOLN
INTRAVENOUS | Status: DC | PRN
  Administered 2011-08-07 (×3): via INTRAVENOUS

## 2011-08-07 MED ORDER — ACETAMINOPHEN 10 MG/ML IV SOLN
1000.0000 mg | Freq: Once | INTRAVENOUS | Status: AC
  Administered 2011-08-07: 1000 mg via INTRAVENOUS

## 2011-08-07 MED ORDER — PHENOL 1.4 % MT LIQD: 1.0000 | OROMUCOSAL | Status: DC | PRN

## 2011-08-07 MED ORDER — HYDROMORPHONE HCL PF 1 MG/ML IJ SOLN
INTRAMUSCULAR | Status: AC
  Filled 2011-08-07: qty 1

## 2011-08-07 MED ORDER — BUPIVACAINE ON-Q PAIN PUMP (FOR ORDER SET NO CHG)
INJECTION | Status: DC
  Filled 2011-08-07: qty 1

## 2011-08-07 MED ORDER — BUPIVACAINE 0.25 % ON-Q PUMP SINGLE CATH 300ML
INJECTION | Status: DC | PRN
  Administered 2011-08-07: 300 mL

## 2011-08-07 MED ORDER — LACTATED RINGERS IV SOLN: INTRAVENOUS | Status: DC

## 2011-08-07 MED ORDER — ACETAMINOPHEN 10 MG/ML IV SOLN
1000.0000 mg | Freq: Four times a day (QID) | INTRAVENOUS | Status: AC
  Administered 2011-08-07 – 2011-08-08 (×4): 1000 mg via INTRAVENOUS
  Filled 2011-08-07 (×6): qty 100

## 2011-08-07 MED ORDER — BISACODYL 10 MG RE SUPP: 10.0000 mg | Freq: Every day | RECTAL | Status: DC | PRN

## 2011-08-07 MED ORDER — ENOXAPARIN SODIUM 30 MG/0.3ML ~~LOC~~ SOLN
30.0000 mg | Freq: Two times a day (BID) | SUBCUTANEOUS | Status: DC
Start: 2011-08-08 — End: 2011-08-10
  Administered 2011-08-08 – 2011-08-09 (×5): 30 mg via SUBCUTANEOUS
  Filled 2011-08-07 (×7): qty 0.3

## 2011-08-07 MED ORDER — ACETAMINOPHEN 10 MG/ML IV SOLN
INTRAVENOUS | Status: AC
  Filled 2011-08-07: qty 100

## 2011-08-07 MED ORDER — TAMSULOSIN HCL 0.4 MG PO CAPS
0.4000 mg | ORAL_CAPSULE | Freq: Every day | ORAL | Status: DC
  Administered 2011-08-07 – 2011-08-10 (×4): 0.4 mg via ORAL
  Filled 2011-08-07 (×4): qty 1

## 2011-08-07 MED ORDER — AMLODIPINE BESYLATE 2.5 MG PO TABS
2.5000 mg | ORAL_TABLET | Freq: Every day | ORAL | Status: DC
  Administered 2011-08-07 – 2011-08-09 (×2): 2.5 mg via ORAL
  Filled 2011-08-07 (×4): qty 1

## 2011-08-07 MED ORDER — FLEET ENEMA 7-19 GM/118ML RE ENEM: 1.0000 | ENEMA | Freq: Once | RECTAL | Status: AC | PRN

## 2011-08-07 MED ORDER — DEXAMETHASONE SODIUM PHOSPHATE 10 MG/ML IJ SOLN
INTRAMUSCULAR | Status: DC | PRN
  Administered 2011-08-07: 10 mg via INTRAVENOUS

## 2011-08-07 MED ORDER — WARFARIN - PHARMACIST DOSING INPATIENT: Freq: Every day | Status: DC

## 2011-08-07 MED ORDER — ZOLPIDEM TARTRATE 5 MG PO TABS
5.0000 mg | ORAL_TABLET | Freq: Every day | ORAL | Status: DC
  Administered 2011-08-07: 5 mg via ORAL
  Filled 2011-08-07: qty 1

## 2011-08-07 MED ORDER — DOCUSATE SODIUM 100 MG PO CAPS
100.0000 mg | ORAL_CAPSULE | Freq: Two times a day (BID) | ORAL | Status: DC
  Administered 2011-08-07 – 2011-08-10 (×7): 100 mg via ORAL

## 2011-08-07 MED ORDER — EPHEDRINE SULFATE 50 MG/ML IJ SOLN
INTRAMUSCULAR | Status: DC | PRN
  Administered 2011-08-07: 10 mg via INTRAVENOUS

## 2011-08-07 MED ORDER — HYDROMORPHONE HCL PF 1 MG/ML IJ SOLN
0.2500 mg | INTRAMUSCULAR | Status: DC | PRN
  Administered 2011-08-07 (×2): 0.5 mg via INTRAVENOUS

## 2011-08-07 MED ORDER — DIPHENHYDRAMINE HCL 12.5 MG/5ML PO ELIX: 12.5000 mg | ORAL_SOLUTION | ORAL | Status: DC | PRN

## 2011-08-07 MED ORDER — MENTHOL 3 MG MT LOZG
1.0000 | LOZENGE | OROMUCOSAL | Status: DC | PRN
  Filled 2011-08-07: qty 9

## 2011-08-07 MED ORDER — PROPOFOL 10 MG/ML IV EMUL
INTRAVENOUS | Status: DC | PRN
  Administered 2011-08-07: 75 ug/kg/min via INTRAVENOUS

## 2011-08-07 MED ORDER — MORPHINE SULFATE 2 MG/ML IJ SOLN
1.0000 mg | INTRAMUSCULAR | Status: DC | PRN
  Administered 2011-08-07 (×5): 2 mg via INTRAVENOUS
  Administered 2011-08-08: 1 mg via INTRAVENOUS
  Filled 2011-08-07 (×6): qty 1

## 2011-08-07 MED ORDER — METOCLOPRAMIDE HCL 5 MG/ML IJ SOLN: 5.0000 mg | Freq: Three times a day (TID) | INTRAMUSCULAR | Status: DC | PRN

## 2011-08-07 MED ORDER — CEFAZOLIN SODIUM-DEXTROSE 2-3 GM-% IV SOLR
INTRAVENOUS | Status: AC
  Filled 2011-08-07: qty 50

## 2011-08-07 MED ORDER — ONDANSETRON HCL 4 MG PO TABS
4.0000 mg | ORAL_TABLET | Freq: Four times a day (QID) | ORAL | Status: DC | PRN
  Administered 2011-08-09: 4 mg via ORAL
  Filled 2011-08-07: qty 1

## 2011-08-07 MED ORDER — TRAMADOL HCL 50 MG PO TABS
50.0000 mg | ORAL_TABLET | Freq: Four times a day (QID) | ORAL | Status: DC | PRN
  Administered 2011-08-08: 50 mg via ORAL
  Administered 2011-08-08: 100 mg via ORAL
  Administered 2011-08-08 – 2011-08-09 (×4): 50 mg via ORAL
  Administered 2011-08-09: 100 mg via ORAL
  Administered 2011-08-10 (×2): 50 mg via ORAL
  Filled 2011-08-07 (×2): qty 1
  Filled 2011-08-07 (×2): qty 2
  Filled 2011-08-07 (×5): qty 1

## 2011-08-07 MED ORDER — CEFAZOLIN SODIUM 1-5 GM-% IV SOLN
1.0000 g | Freq: Four times a day (QID) | INTRAVENOUS | Status: AC
  Administered 2011-08-07 (×2): 1 g via INTRAVENOUS
  Filled 2011-08-07 (×2): qty 50

## 2011-08-07 MED ORDER — 0.9 % SODIUM CHLORIDE (POUR BTL) OPTIME
TOPICAL | Status: DC | PRN
  Administered 2011-08-07: 1000 mL

## 2011-08-07 MED ORDER — WARFARIN SODIUM 10 MG PO TABS
10.0000 mg | ORAL_TABLET | Freq: Once | ORAL | Status: AC
  Administered 2011-08-07: 10 mg via ORAL
  Filled 2011-08-07: qty 1

## 2011-08-07 SURGICAL SUPPLY — 50 items
BAG ZIPLOCK 12X15 (MISCELLANEOUS) ×2 IMPLANT
BANDAGE ELASTIC 6 VELCRO ST LF (GAUZE/BANDAGES/DRESSINGS) ×2 IMPLANT
BANDAGE ESMARK 6X9 LF (GAUZE/BANDAGES/DRESSINGS) ×1 IMPLANT
BLADE SAG 18X100X1.27 (BLADE) ×2 IMPLANT
BLADE SAW SGTL 11.0X1.19X90.0M (BLADE) ×2 IMPLANT
BNDG ESMARK 6X9 LF (GAUZE/BANDAGES/DRESSINGS) ×2
BOWL SMART MIX CTS (DISPOSABLE) ×2 IMPLANT
CATH KIT ON-Q SILVERSOAK 5IN (CATHETERS) ×2 IMPLANT
CEMENT HV SMART SET (Cement) ×4 IMPLANT
CLOTH BEACON ORANGE TIMEOUT ST (SAFETY) ×2 IMPLANT
CLSR STERI-STRIP ANTIMIC 1/2X4 (GAUZE/BANDAGES/DRESSINGS) ×2 IMPLANT
CUFF TOURN SGL QUICK 34 (TOURNIQUET CUFF) ×1
CUFF TRNQT CYL 34X4X40X1 (TOURNIQUET CUFF) ×1 IMPLANT
DRAPE EXTREMITY T 121X128X90 (DRAPE) ×2 IMPLANT
DRAPE POUCH INSTRU U-SHP 10X18 (DRAPES) ×2 IMPLANT
DRAPE U-SHAPE 47X51 STRL (DRAPES) ×2 IMPLANT
DRSG ADAPTIC 3X8 NADH LF (GAUZE/BANDAGES/DRESSINGS) ×2 IMPLANT
DRSG PAD ABDOMINAL 8X10 ST (GAUZE/BANDAGES/DRESSINGS) ×2 IMPLANT
DURAPREP 26ML APPLICATOR (WOUND CARE) ×2 IMPLANT
ELECT REM PT RETURN 9FT ADLT (ELECTROSURGICAL) ×2
ELECTRODE REM PT RTRN 9FT ADLT (ELECTROSURGICAL) ×1 IMPLANT
EVACUATOR 1/8 PVC DRAIN (DRAIN) ×2 IMPLANT
FACESHIELD LNG OPTICON STERILE (SAFETY) ×8 IMPLANT
GLOVE BIO SURGEON STRL SZ8 (GLOVE) ×2 IMPLANT
GLOVE BIOGEL PI IND STRL 8 (GLOVE) ×1 IMPLANT
GLOVE BIOGEL PI INDICATOR 8 (GLOVE) ×1
GLOVE SURG SS PI 6.5 STRL IVOR (GLOVE) ×4 IMPLANT
GOWN STRL NON-REIN LRG LVL3 (GOWN DISPOSABLE) ×4 IMPLANT
GOWN STRL REIN XL XLG (GOWN DISPOSABLE) ×2 IMPLANT
HANDPIECE INTERPULSE COAX TIP (DISPOSABLE) ×1
IMMOBILIZER KNEE 20 (SOFTGOODS) ×2
IMMOBILIZER KNEE 20 THIGH 36 (SOFTGOODS) ×1 IMPLANT
KIT BASIN OR (CUSTOM PROCEDURE TRAY) ×2 IMPLANT
MANIFOLD NEPTUNE II (INSTRUMENTS) ×2 IMPLANT
NS IRRIG 1000ML POUR BTL (IV SOLUTION) ×2 IMPLANT
PACK TOTAL JOINT (CUSTOM PROCEDURE TRAY) ×2 IMPLANT
PADDING CAST COTTON 6X4 STRL (CAST SUPPLIES) ×6 IMPLANT
POSITIONER SURGICAL ARM (MISCELLANEOUS) ×2 IMPLANT
SET HNDPC FAN SPRY TIP SCT (DISPOSABLE) ×1 IMPLANT
SPONGE GAUZE 4X4 12PLY (GAUZE/BANDAGES/DRESSINGS) ×2 IMPLANT
STRIP CLOSURE SKIN 1/2X4 (GAUZE/BANDAGES/DRESSINGS) ×4 IMPLANT
SUCTION FRAZIER 12FR DISP (SUCTIONS) ×2 IMPLANT
SUT MNCRL AB 4-0 PS2 18 (SUTURE) ×2 IMPLANT
SUT VIC AB 2-0 CT1 27 (SUTURE) ×3
SUT VIC AB 2-0 CT1 TAPERPNT 27 (SUTURE) ×3 IMPLANT
SUT VLOC 180 0 24IN GS25 (SUTURE) ×2 IMPLANT
TOWEL OR 17X26 10 PK STRL BLUE (TOWEL DISPOSABLE) ×4 IMPLANT
TRAY FOLEY CATH 14FRSI W/METER (CATHETERS) ×2 IMPLANT
WATER STERILE IRR 1500ML POUR (IV SOLUTION) ×2 IMPLANT
WRAP KNEE MAXI GEL POST OP (GAUZE/BANDAGES/DRESSINGS) ×2 IMPLANT

## 2011-08-07 NOTE — Anesthesia Preprocedure Evaluation (Signed)
Anesthesia Evaluation  Patient identified by MRN, date of birth, ID band Patient awake  General Assessment Comment:Advanced yrs  Reviewed: Allergy & Precautions, H&P , NPO status , Patient's Chart, lab work & pertinent test results, reviewed documented beta blocker date and time   Airway Mallampati: II TM Distance: >3 FB   Mouth opening: Limited Mouth Opening  Dental  (+) Teeth Intact and Dental Advisory Given   Pulmonary neg pulmonary ROS,  breath sounds clear to auscultation        Cardiovascular hypertension, + CAD + dysrhythmias Atrial Fibrillation Rhythm:Regular Rate:Normal  CAD, s/p PTCA w/ stent 2007 Currently asymptomatic Given clearance   Neuro/Psych TIAs , last one 38yrs ago Denies deficits TIAnegative psych ROS   GI/Hepatic negative GI ROS, Neg liver ROS,   Endo/Other  negative endocrine ROS  Renal/GU negative Renal ROS  negative genitourinary   Musculoskeletal   Abdominal   Peds negative pediatric ROS (+)  Hematology negative hematology ROS (+)   Anesthesia Other Findings Upper front bridges  Reproductive/Obstetrics negative OB ROS                           Anesthesia Physical Anesthesia Plan  ASA: III  Anesthesia Plan: Spinal   Post-op Pain Management:    Induction: Intravenous  Airway Management Planned: Mask  Additional Equipment:   Intra-op Plan:   Post-operative Plan:   Informed Consent: I have reviewed the patients History and Physical, chart, labs and discussed the procedure including the risks, benefits and alternatives for the proposed anesthesia with the patient or authorized representative who has indicated his/her understanding and acceptance.   Dental advisory given  Plan Discussed with: CRNA and Surgeon  Anesthesia Plan Comments:         Anesthesia Quick Evaluation

## 2011-08-07 NOTE — Progress Notes (Signed)
Utilization review completed.  
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
## 2011-08-07 NOTE — Transfer of Care (Signed)
Immediate Anesthesia Transfer of Care Note  Patient: Henry Alvarez  Procedure(s) Performed: Procedure(s) (LRB): TOTAL KNEE ARTHROPLASTY (Left)  Patient Location: PACU  Anesthesia Type: Spinal  Level of Consciousness: sedated, patient cooperative and responds to stimulaton  Airway & Oxygen Therapy: Patient Spontanous Breathing and Patient connected to face mask oxgen  Post-op Assessment: Report given to PACU RN and Post -op Vital signs reviewed and stable  Post vital signs: Reviewed and stable  Complications: No apparent anesthesia complications noted T-5 level on release to PACU staff.

## 2011-08-07 NOTE — Anesthesia Procedure Notes (Signed)
Spinal  Patient location during procedure: OR Start time: 08/07/2011 7:26 AM End time: 08/07/2011 7:36 AM Staffing Anesthesiologist: Lestine Box B Performed by: anesthesiologist  Preanesthetic Checklist Completed: patient identified, site marked, surgical consent, pre-op evaluation, timeout performed, IV checked, risks and benefits discussed and monitors and equipment checked Spinal Block Patient position: sitting Prep: Betadine and site prepped and draped Patient monitoring: heart rate, cardiac monitor, continuous pulse ox and blood pressure Approach: right paramedian Location: L3-4 Injection technique: single-shot Needle Needle type: Quincke  Needle gauge: 25 G Needle length: 10 cm Needle insertion depth: 3 cm Assessment Sensory level: T4 Additional Notes [id number], 2014-09

## 2011-08-07 NOTE — Interval H&P Note (Signed)
History and Physical Interval Note:  08/07/2011 7:04 AM  Henry Alvarez  has presented today for surgery, with the diagnosis of osteoarthritis left knee   The various methods of treatment have been discussed with the patient and family. After consideration of risks, benefits and other options for treatment, the patient has consented to  Procedure(s) (LRB): TOTAL KNEE ARTHROPLASTY (Left) as a surgical intervention .  The patient's history has been reviewed, patient examined, no change in status, stable for surgery.  I have reviewed the patients' chart and labs.  Questions were answered to the patient's satisfaction.     Loanne Drilling

## 2011-08-07 NOTE — Anesthesia Postprocedure Evaluation (Signed)
  Anesthesia Post-op Note  Patient: Henry Alvarez  Procedure(s) Performed: Procedure(s) (LRB): TOTAL KNEE ARTHROPLASTY (Left)  Patient Location: PACU  Anesthesia Type: Spinal  Level of Consciousness: oriented and sedated  Airway and Oxygen Therapy: Patient Spontanous Breathing and Patient connected to nasal cannula oxygen  Post-op Pain: none  Post-op Assessment: Post-op Vital signs reviewed, Patient's Cardiovascular Status Stable, Respiratory Function Stable and Patent Airway  Post-op Vital Signs: stable  Complications: No apparent anesthesia complications

## 2011-08-07 NOTE — Op Note (Signed)
Pre-operative diagnosis- Osteoarthritis  Left knee(s)  Post-operative diagnosis- Osteoarthritis Left knee(s)  Procedure-  Left  Total Knee Arthroplasty  Surgeon- Gus Rankin. Asuna Peth, MD  Assistant- Dimitri Ped, PA-C   Anesthesia-  Spinal EBL-* No blood loss amount entered *  Drains Hemovac  Tourniquet time-  Total Tourniquet Time Documented: Thigh (Left) - 38 minutes   Complications- None  Condition-PACU - hemodynamically stable.   Brief Clinical Note   Henry Alvarez is a 78 y.o. year old male with end stage OA of his left knee with progressively worsening pain and dysfunction. He has constant pain, with activity and at rest and significant functional deficits with difficulties even with ADLs. He has had extensive non-op management including analgesics, injections of cortisone and viscosupplements, and home exercise program, but remains in significant pain with significant dysfunction. Radiographs show bone on bone arthritis medial and patellofemoral with varus deformity and tibial subluxation. He presents now for left Total Knee Arthroplasty.    Procedure in detail---   The patient is brought into the operating room and positioned supine on the operating table. After successful administration of  Spinal,   a tourniquet is placed high on the  Left thigh(s) and the lower extremity is prepped and draped in the usual sterile fashion. Time out is performed by the operating team and then the  Left lower extremity is wrapped in Esmarch, knee flexed and the tourniquet inflated to 300 mmHg.       A midline incision is made with a ten blade through the subcutaneous tissue to the level of the extensor mechanism. A fresh blade is used to make a medial parapatellar arthrotomy. Soft tissue over the proximal medial tibia is subperiosteally elevated to the joint line with a knife and into the semimembranosus bursa with a Cobb elevator. Soft tissue over the proximal lateral tibia is elevated with  attention being paid to avoiding the patellar tendon on the tibial tubercle. The patella is everted, knee flexed 90 degrees and the ACL and PCL are removed. Findings are bone on bone medial and patellofemoral with large osteophytes.        The drill is used to create a starting hole in the distal femur and the canal is thoroughly irrigated with sterile saline to remove the fatty contents. The 5 degree Left  valgus alignment guide is placed into the femoral canal and the distal femoral cutting block is pinned to remove 10 mm off the distal femur. Resection is made with an oscillating saw.      The tibia is subluxed forward and the menisci are removed. The extramedullary alignment guide is placed referencing proximally at the medial aspect of the tibial tubercle and distally along the second metatarsal axis and tibial crest. The block is pinned to remove 2mm off the more deficient medial  side. Resection is made with an oscillating saw. Size 4is the most appropriate size for the tibia and the proximal tibia is prepared with the modular drill and keel punch for that size.      The femoral sizing guide is placed and size 4 is most appropriate. Rotation is marked off the epicondylar axis and confirmed by creating a rectangular flexion gap at 90 degrees. The size 4 cutting block is pinned in this rotation and the anterior, posterior and chamfer cuts are made with the oscillating saw. The intercondylar block is then placed and that cut is made.      Trial size 4 tibial component, trial size 4 posterior  stabilized femur and a 10  mm posterior stabilized rotating platform insert trial is placed. Full extension is achieved with excellent varus/valgus and anterior/posterior balance throughout full range of motion. The patella is everted and thickness measured to be 22  mm. Free hand resection is taken to 12 mm, a 38 template is placed, lug holes are drilled, trial patella is placed, and it tracks normally. Osteophytes are  removed off the posterior femur with the trial in place. All trials are removed and the cut bone surfaces prepared with pulsatile lavage. Cement is mixed and once ready for implantation, the size 4 tibial implant, size  4 posterior stabilized femoral component, and the size 38 patella are cemented in place and the patella is held with the clamp. The trial insert is placed and the knee held in full extension. All extruded cement is removed and once the cement is hard the permanent 10 mm posterior stabilized rotating platform insert is placed into the tibial tray.      The wound is copiously irrigated with saline solution and the extensor mechanism closed over a hemovac drain with #1 PDS suture. The tourniquet is released for a total tourniquet time of 38  minutes. Flexion against gravity is 140 degrees and the patella tracks normally. Subcutaneous tissue is closed with 2.0 vicryl and subcuticular with running 4.0 Monocryl. The catheter for the Marcaine pain pump is placed and the pump is initiated. The incision is cleaned and dried and steri-strips and a bulky sterile dressing are applied. The limb is placed into a knee immobilizer and the patient is awakened and transported to recovery in stable condition.      Please note that a surgical assistant was a medical necessity for this procedure in order to perform it in a safe and expeditious manner. Surgical assistant was necessary to retract the ligaments and vital neurovascular structures to prevent injury to them and also necessary for proper positioning of the limb to allow for anatomic placement of the prosthesis.   Gus Rankin Lindsey Hommel, MD    08/07/2011, 8:38 AM

## 2011-08-08 LAB — BASIC METABOLIC PANEL
Chloride: 104 mEq/L (ref 96–112)
GFR calc Af Amer: 88 mL/min — ABNORMAL LOW (ref >90)
Potassium: 3.9 mEq/L (ref 3.5–5.1)
Sodium: 135 mEq/L (ref 135–145)

## 2011-08-08 LAB — CBC
Platelets: 79 10*3/uL — ABNORMAL LOW (ref 150–400)
RDW: 13.7 % (ref 11.5–15.5)
WBC: 11.5 10*3/uL — ABNORMAL HIGH (ref 4.0–10.5)

## 2011-08-08 LAB — PROTIME-INR
INR: 1.42 (ref 0.00–1.49)
Prothrombin Time: 17.6 seconds — ABNORMAL HIGH (ref 11.6–15.2)

## 2011-08-08 MED ORDER — FUROSEMIDE 10 MG/ML IJ SOLN
10.0000 mg | Freq: Once | INTRAMUSCULAR | Status: AC
  Administered 2011-08-08: 10 mg via INTRAVENOUS
  Filled 2011-08-08: qty 1

## 2011-08-08 MED ORDER — POLYSACCHARIDE IRON COMPLEX 150 MG PO CAPS
150.0000 mg | ORAL_CAPSULE | Freq: Every day | ORAL | Status: DC
  Administered 2011-08-08 – 2011-08-10 (×3): 150 mg via ORAL
  Filled 2011-08-08 (×3): qty 1

## 2011-08-08 MED ORDER — WARFARIN SODIUM 3 MG PO TABS
9.0000 mg | ORAL_TABLET | Freq: Once | ORAL | Status: AC
  Administered 2011-08-08: 9 mg via ORAL
  Filled 2011-08-08: qty 1

## 2011-08-08 NOTE — Progress Notes (Signed)
Physical Therapy Treatment Patient Details Name: Henry Alvarez MRN: [id number] DOB: November 16, 1924 Today's Date: 08/08/2011 Time: 1191-4782 PT Time Calculation (min): 16 min  PT Assessment / Plan / Recommendation Comments on Treatment Session  Slight increase in confusion.  Bed alarm set.  RN aware    Follow Up Recommendations  Skilled nursing facility    Barriers to Discharge        Equipment Recommendations  Defer to next venue    Recommendations for Other Services OT consult  Frequency 7X/week   Plan Discharge plan remains appropriate    Precautions / Restrictions Precautions Precautions: Knee Required Braces or Orthoses: Knee Immobilizer - Left Knee Immobilizer - Left: Discontinue once straight leg raise with < 10 degree lag Restrictions Weight Bearing Restrictions: No Other Position/Activity Restrictions: WBAT   Pertinent Vitals/Pain     Mobility  Bed Mobility Bed Mobility: Sit to Supine Supine to Sit: 1: +2 Total assist Supine to Sit: Patient Percentage: 30% Sit to Supine: 1: +2 Total assist Sit to Supine: Patient Percentage: 30% Details for Bed Mobility Assistance: cues for sequence and use of UEs and R LE to self assist - drop pad under pt used to assist pt Transfers Transfers: Sit to Stand;Stand to Sit Sit to Stand: 1: +2 Total assist Sit to Stand: Patient Percentage: 50% Stand to Sit: 1: +2 Total assist Stand to Sit: Patient Percentage: 60% Details for Transfer Assistance: cues for use of UEs and for LE management Ambulation/Gait Ambulation/Gait Assistance: 1: +2 Total assist Ambulation/Gait: Patient Percentage: 60% Ambulation Distance (Feet): 4 Feet Assistive device: Rolling walker Ambulation/Gait Assistance Details: cues for sequence, posture, position from RW.  Pt with difficulty comprehending stepping backward Gait Pattern: Step-to pattern    Exercises Total Joint Exercises Ankle Circles/Pumps: AROM;Both;10 reps;Supine Quad Sets: AROM;Both;10  reps;Supine   PT Diagnosis: Difficulty walking  PT Problem List: Decreased strength;Decreased range of motion;Decreased activity tolerance;Decreased mobility;Decreased knowledge of use of DME;Pain PT Treatment Interventions: DME instruction;Gait training;Stair training;Functional mobility training;Therapeutic activities;Therapeutic exercise;Patient/family education   PT Goals Acute Rehab PT Goals PT Goal Formulation: With patient Time For Goal Achievement: 08/14/11 Potential to Achieve Goals: Fair Pt will go Supine/Side to Sit: with min assist PT Goal: Supine/Side to Sit - Progress: Goal set today Pt will go Sit to Supine/Side: with min assist PT Goal: Sit to Supine/Side - Progress: Goal set today Pt will go Sit to Stand: with min assist PT Goal: Sit to Stand - Progress: Goal set today Pt will go Stand to Sit: with min assist PT Goal: Stand to Sit - Progress: Goal set today Pt will Ambulate: 16 - 50 feet;with min assist;with rolling walker PT Goal: Ambulate - Progress: Goal set today  Visit Information  Last PT Received On: 08/08/11 Assistance Needed: +2    Subjective Data  Subjective: I need to lay down Patient Stated Goal: Rehab and then home with spouse   Cognition  Overall Cognitive Status: Impaired Area of Impairment: Other (comment) (pt asks who'is visiting with his wife "oh , she's not here?") Arousal/Alertness: Awake/alert Orientation Level: Situation Behavior During Session: WFL for tasks performed    Balance     End of Session PT - End of Session Equipment Utilized During Treatment: Gait belt;Left knee immobilizer Activity Tolerance: Patient limited by pain;Patient limited by fatigue Patient left: in bed;with call bell/phone within reach Nurse Communication: Mobility status   GP     Henry Alvarez 08/08/2011, 4:19 PM

## 2011-08-08 NOTE — Progress Notes (Signed)
Clinical Social Work Department BRIEF PSYCHOSOCIAL ASSESSMENT 08/08/2011  Patient:  Henry Alvarez, Henry Alvarez     Account Number:  [id number]     Admit date:  08/07/2011  Clinical Social Worker:  Candie Chroman  Date/Time:  08/08/2011 03:52 PM  Referred by:  Physician  Date Referred:  08/08/2011 Referred for  SNF Placement   Other Referral:   Interview type:  Patient Other interview type:    PSYCHOSOCIAL DATA Living Status:  WIFE Admitted from facility:   Level of care:   Primary support name:  Allyson Sabal Primary support relationship to patient:  SPOUSE Degree of support available:   supportive    CURRENT CONCERNS Current Concerns  Post-Acute Placement   Other Concerns:    SOCIAL WORK ASSESSMENT / PLAN Pt is an 52 yr. old gentleman living at home prior to hospitalization. CSW met with pt to assist with d/c planning. Pt requires ST SNF placement upon hospital d/c. Pt has made prior arrangements to have rehab at [facility]. SNF contacted and d/c plan confirmed. CSW will follow to assist with d/c planning to SNF.   Assessment/plan status:  Psychosocial Support/Ongoing Assessment of Needs Other assessment/ plan:   Information/referral to community resources:   none needed at this time    PATIENT'S/FAMILY'S RESPONSE TO PLAN OF CARE: Pt plans to have ST rehab at [facility].     Cori Razor LCSW [phone]

## 2011-08-08 NOTE — Progress Notes (Signed)
Clinical Social Work Department CLINICAL SOCIAL WORK PLACEMENT NOTE 08/08/2011  Patient:  Henry Alvarez, Henry Alvarez  Account Number:  [id number] Admit date:  08/07/2011  Clinical Social Worker:  Cori Razor, LCSW  Date/time:  08/08/2011 04:00 PM  Clinical Social Work is seeking post-discharge placement for this patient at the following level of care:   SKILLED NURSING   (*CSW will update this form in Epic as items are completed)     Patient/family provided with [facility] Department of Clinical Social Work's list of facilities offering this level of care within the geographic area requested by the patient (or if unable, by the patient's family).    Patient/family informed of their freedom to choose among providers that offer the needed level of care, that participate in Medicare, Medicaid or managed care program needed by the patient, have an available bed and are willing to accept the patient.    Patient/family informed of [facility]' ownership interest in [facility], as well as of the fact that they are under no obligation to receive care at this facility.  PASARR submitted to EDS on 08/08/2011 PASARR number received from EDS on 08/08/2011  FL2 transmitted to all facilities in geographic area requested by pt/family on  08/08/2011 FL2 transmitted to all facilities within larger geographic area on   Patient informed that his/her managed care company has contracts with or will negotiate with  certain facilities, including the following:     Patient/family informed of bed offers received:  08/08/2011 Patient chooses bed at [facility] Physician recommends and patient chooses bed at    Patient to be transferred to  on   Patient to be transferred to facility by   The following physician request were entered in Epic:   Additional Comments: Pt has made prior arrangements to has ST Rehab at [facility].  Cori Razor LCSW [phone]
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
## 2011-08-08 NOTE — Evaluation (Signed)
Physical Therapy Evaluation Patient Details Name: Henry Alvarez MRN: [id number] DOB: Mar 31, 1924 Today's Date: 08/08/2011 Time: 4098-1191 PT Time Calculation (min): 36 min  PT Assessment / Plan / Recommendation Clinical Impression  Pt with L TKR presents with decreased L LE strength/ROM, min tolerance for WB on L LE, c/o neck pain and significant limitations in functional mobility    PT Assessment  Patient needs continued PT services    Follow Up Recommendations  Skilled nursing facility    Barriers to Discharge        Equipment Recommendations  Defer to next venue    Recommendations for Other Services OT consult   Frequency 7X/week    Precautions / Restrictions Precautions Precautions: Knee Required Braces or Orthoses: Knee Immobilizer - Left Knee Immobilizer - Left: Discontinue once straight leg raise with < 10 degree lag Restrictions Weight Bearing Restrictions: No Other Position/Activity Restrictions: WBAT   Pertinent Vitals/Pain Pt c/o pain with movement and with WB but difficulty with rating system.  Cold packs provided. MEDS received.      Mobility  Bed Mobility Bed Mobility: Supine to Sit Supine to Sit: 1: +2 Total assist Supine to Sit: Patient Percentage: 30% Details for Bed Mobility Assistance: cues for sequence and use of UEs and R LE to self assist - drop pad under pt used to assist pt Transfers Transfers: Sit to Stand;Stand to Sit Sit to Stand: 1: +2 Total assist Sit to Stand: Patient Percentage: 50% Stand to Sit: 1: +2 Total assist Stand to Sit: Patient Percentage: 60% Details for Transfer Assistance: cues for use of UEs and for LE management Ambulation/Gait Ambulation/Gait Assistance: 1: +2 Total assist Ambulation/Gait: Patient Percentage: 60% Ambulation Distance (Feet): 3 Feet Assistive device: Rolling walker Ambulation/Gait Assistance Details: cues for posture, increased UE WB; position from RW and sequence.  Pt tolerating min WB on L LE Gait  Pattern: Step-to pattern    Exercises Total Joint Exercises Ankle Circles/Pumps: AROM;Both;10 reps;Supine Quad Sets: AROM;Both;10 reps;Supine   PT Diagnosis: Difficulty walking  PT Problem List: Decreased strength;Decreased range of motion;Decreased activity tolerance;Decreased mobility;Decreased knowledge of use of DME;Pain PT Treatment Interventions: DME instruction;Gait training;Stair training;Functional mobility training;Therapeutic activities;Therapeutic exercise;Patient/family education   PT Goals Acute Rehab PT Goals PT Goal Formulation: With patient Time For Goal Achievement: 08/14/11 Potential to Achieve Goals: Fair Pt will go Supine/Side to Sit: with min assist PT Goal: Supine/Side to Sit - Progress: Goal set today Pt will go Sit to Supine/Side: with min assist PT Goal: Sit to Supine/Side - Progress: Goal set today Pt will go Sit to Stand: with min assist PT Goal: Sit to Stand - Progress: Goal set today Pt will go Stand to Sit: with min assist PT Goal: Stand to Sit - Progress: Goal set today Pt will Ambulate: 16 - 50 feet;with min assist;with rolling walker PT Goal: Ambulate - Progress: Goal set today  Visit Information  Last PT Received On: 08/08/11 Assistance Needed: +2    Subjective Data  Subjective: My neck hurts Patient Stated Goal: Rehab and then home with spouse   Prior Functioning  Home Living Lives With: Spouse Prior Function Level of Independence: Independent with assistive device(s) Able to Take Stairs?: Yes Driving: Yes Vocation: Retired Musician: No difficulties    Cognition  Overall Cognitive Status: Appears within functional limits for tasks assessed/performed Arousal/Alertness: Awake/alert Orientation Level: Appears intact for tasks assessed Behavior During Session: [facility] for tasks performed    Extremity/Trunk Assessment Right Upper Extremity Assessment RUE ROM/Strength/Tone: Deficits RUE ROM/Strength/Tone  Deficits:  shoulder flex /abd ltd to 45 Left Upper Extremity Assessment LUE ROM/Strength/Tone: Deficits LUE ROM/Strength/Tone Deficits: shoulder flex /abd ltd to 45 Right Lower Extremity Assessment RLE ROM/Strength/Tone: [facility] for tasks assessed Left Lower Extremity Assessment LLE ROM/Strength/Tone: Deficits LLE ROM/Strength/Tone Deficits: quad strength 2-/5; knee AAROM deferred 2* pain level with movement   Balance    End of Session PT - End of Session Equipment Utilized During Treatment: Gait belt;Left knee immobilizer Activity Tolerance: Patient limited by pain;Patient limited by fatigue Patient left: in chair;with call bell/phone within reach Nurse Communication: Mobility status  GP     Henry Alvarez 08/08/2011, 4:10 PM

## 2011-08-09 LAB — BASIC METABOLIC PANEL
CO2: 24 mEq/L (ref 19–32)
Chloride: 100 mEq/L (ref 96–112)
Creatinine, Ser: 0.84 mg/dL (ref 0.50–1.35)
Glucose, Bld: 120 mg/dL — ABNORMAL HIGH (ref 70–99)
Sodium: 132 mEq/L — ABNORMAL LOW (ref 135–145)

## 2011-08-09 LAB — PROTIME-INR
INR: 1.55 — ABNORMAL HIGH (ref 0.00–1.49)
Prothrombin Time: 18.9 seconds — ABNORMAL HIGH (ref 11.6–15.2)

## 2011-08-09 LAB — CBC
Hemoglobin: 9.2 g/dL — ABNORMAL LOW (ref 13.0–17.0)
MCV: 92 fL (ref 78.0–100.0)
Platelets: 75 10*3/uL — ABNORMAL LOW (ref 150–400)
RBC: 3 MIL/uL — ABNORMAL LOW (ref 4.22–5.81)
WBC: 10.3 10*3/uL (ref 4.0–10.5)

## 2011-08-09 LAB — TYPE AND SCREEN: Unit division: 0

## 2011-08-09 MED ORDER — POLYSACCHARIDE IRON COMPLEX 150 MG PO CAPS: 150.0000 mg | ORAL_CAPSULE | Freq: Every day | ORAL | Status: DC

## 2011-08-09 MED ORDER — WARFARIN SODIUM 3 MG PO TABS: 9.0000 mg | ORAL_TABLET | Freq: Once | ORAL | Status: DC

## 2011-08-09 MED ORDER — WARFARIN SODIUM 6 MG PO TABS
9.0000 mg | ORAL_TABLET | Freq: Once | ORAL | Status: AC
  Administered 2011-08-09: 9 mg via ORAL
  Filled 2011-08-09: qty 1

## 2011-08-09 MED ORDER — METHOCARBAMOL 500 MG PO TABS: 500.0000 mg | ORAL_TABLET | Freq: Four times a day (QID) | ORAL | Status: AC | PRN

## 2011-08-09 MED ORDER — TRAMADOL HCL 50 MG PO TABS: 50.0000 mg | ORAL_TABLET | Freq: Four times a day (QID) | ORAL | Status: AC | PRN

## 2011-08-09 NOTE — Progress Notes (Signed)
ANTICOAGULATION CONSULT NOTE - Follow-up Consult  Pharmacy Consult for Warfarin Indication: PAF, Hx TIAs, s/p TKA  No Known Allergies  Patient Measurements: Height: 5\' 7"  (170.2 cm) Weight: 158 lb (71.668 kg) IBW/kg (Calculated) : 66.1 .  Vital Signs: Temp: 98.2 F (36.8 C) (07/03 0530) Temp src: Oral (07/03 0530) BP: 163/76 mmHg (07/03 0530) Pulse Rate: 80  (07/03 0530)  Labs:  Basename 08/09/11 0355 08/08/11 0400 08/07/11 0635  HGB 9.2* 7.8* --  HCT 27.6* 23.2* --  PLT 75* 79* --  APTT -- -- --  LABPROT 18.9* 17.6* 15.4*  INR 1.55* 1.42 1.19  HEPARINUNFRC -- -- --  CREATININE 0.84 0.90 --  CKTOTAL -- -- --  CKMB -- -- --  TROPONINI -- -- --   Estimated Creatinine Clearance: 61.2 ml/min (by C-G formula based on Cr of 0.84).  Assessment:  78 y/o M on chronic warfarin for PAF and hx TIAs, reported dosage PTA 9mg  daily except 6mg  on Mondays.  Pt underwent L TKA on 7/1 and coumadin resumed post-op.  Pt also on concomitant Lovenox 30 mg sq q12h until INR >/= 1.8  INR 1.55 today  Thrombocytopenia noted at baseline - f/u plts while on Lovenox.  No bleeding/complications noted.  Goal of Therapy:  INR 2-3 Monitor platelets by anticoagulation protocol: Yes   Plan:   Continue Warfarin 9 mg po x 1 tonight as per home regimen  Continue prophylactic-dose Lovenox per ortho until INR >/= 1.8  PT/INR daily.  Watch platelet count on Lovenox  Geoffry Paradise, PharmD, BCPS Pager: [phone] 10:55 AM

## 2011-08-09 NOTE — Progress Notes (Signed)
Physical Therapy Treatment Patient Details Name: Henry Alvarez MRN: [id number] DOB: 01/05/1925 Today's Date: 08/09/2011 Time: 4098-1191 PT Time Calculation (min): 16 min  PT Assessment / Plan / Recommendation Comments on Treatment Session  Pt following cues with min difficulty this date but continues limited by pain with activity and fatigue    Follow Up Recommendations  Skilled nursing facility    Barriers to Discharge        Equipment Recommendations  Defer to next venue    Recommendations for Other Services OT consult  Frequency 7X/week   Plan Discharge plan remains appropriate    Precautions / Restrictions Precautions Precautions: Knee Required Braces or Orthoses: Knee Immobilizer - Left Knee Immobilizer - Left: Discontinue once straight leg raise with < 10 degree lag Restrictions Weight Bearing Restrictions: No Other Position/Activity Restrictions: WBAT   Pertinent Vitals/Pain C/o pain with activity - min pain at rest.  Pt premedicated prior to session    Mobility  Bed Mobility Bed Mobility: Sit to Supine Supine to Sit: 1: +2 Total assist;HOB elevated Supine to Sit: Patient Percentage: 20% Sit to Supine: 1: +2 Total assist Sit to Supine: Patient Percentage: 30% Details for Bed Mobility Assistance: assist for LEs to EOB and significant assist for trunk to upright and maintain balance as he came up to sitting. Transfers Transfers: Sit to Stand;Stand to Sit Sit to Stand: 1: +2 Total assist;From elevated surface;With upper extremity assist;From chair/3-in-1 Sit to Stand: Patient Percentage: 40% Stand to Sit: 1: +2 Total assist;With upper extremity assist;To bed Stand to Sit: Patient Percentage: 40% Details for Transfer Assistance: cues for use of UEs/placement and required assist to manage L LE out in front to sit. Ambulation/Gait Ambulation/Gait Assistance: 1: +2 Total assist Ambulation/Gait: Patient Percentage: 60% Ambulation Distance (Feet): 8 Feet Assistive  device: Rolling walker Ambulation/Gait Assistance Details: cues for posture, position from RW, increased UE WB, stride length and sequence Gait Pattern: Step-to pattern Gait velocity: [id number]    Exercises     PT Diagnosis:    PT Problem List:   PT Treatment Interventions:     PT Goals Acute Rehab PT Goals PT Goal Formulation: With patient Time For Goal Achievement: 08/14/11 Potential to Achieve Goals: Fair Pt will go Supine/Side to Sit: with min assist PT Goal: Supine/Side to Sit - Progress: Progressing toward goal Pt will go Sit to Supine/Side: with min assist PT Goal: Sit to Supine/Side - Progress: Progressing toward goal Pt will go Sit to Stand: with min assist PT Goal: Sit to Stand - Progress: Progressing toward goal Pt will go Stand to Sit: with min assist PT Goal: Stand to Sit - Progress: Progressing toward goal Pt will Ambulate: 16 - 50 feet;with min assist;with rolling walker PT Goal: Ambulate - Progress: Progressing toward goal  Visit Information  Last PT Received On: 11/23/11 Assistance Needed: +2    Subjective Data  Subjective: I need to lay down Patient Stated Goal: Rehab and then home with spouse   Cognition  Overall Cognitive Status: Appears within functional limits for tasks assessed/performed Arousal/Alertness: Awake/alert Orientation Level: Appears intact for tasks assessed Behavior During Session: [facility] for tasks performed    Balance     End of Session PT - End of Session Equipment Utilized During Treatment: Gait belt;Left knee immobilizer Activity Tolerance: Patient limited by pain;Patient limited by fatigue Patient left: in bed;with call bell/phone within reach Nurse Communication: Mobility status   GP     Laurence Folz 08/09/2011, 12:09 PM

## 2011-08-09 NOTE — Evaluation (Signed)
Occupational Therapy Evaluation Patient Details Name: RODARIUS KICHLINE MRN: [id number] DOB: 02-26-1924 Today's Date: 08/09/2011 Time: 4098-1191 OT Time Calculation (min): 25 min  OT Assessment / Plan / Recommendation Clinical Impression  Pt is s/p L TKA and displays decreased strength, ROM, functional mobility and increased pain. Will benefit from skilled OT services to improve independence with self care tasks for next venue of care.     OT Assessment  Patient needs continued OT Services    Follow Up Recommendations  Skilled nursing facility    Barriers to Discharge      Equipment Recommendations  Defer to next venue    Recommendations for Other Services    Frequency  Min 1X/week    Precautions / Restrictions Precautions Precautions: Knee Required Braces or Orthoses: Knee Immobilizer - Left Knee Immobilizer - Left: Discontinue once straight leg raise with < 10 degree lag Restrictions Weight Bearing Restrictions: No        ADL  Eating/Feeding: Simulated;Independent Where Assessed - Eating/Feeding: Chair Grooming: Simulated;Wash/dry hands Where Assessed - Grooming: Supported sitting Upper Body Bathing: Simulated;Chest;Right arm;Left arm;Abdomen;Moderate assistance Where Assessed - Upper Body Bathing: Unsupported sitting Lower Body Bathing: +2 Total assistance Lower Body Bathing: Patient Percentage: 20% Where Assessed - Lower Body Bathing: Supported sit to stand Upper Body Dressing: Simulated;Moderate assistance;Other (comment) (with gown) Where Assessed - Upper Body Dressing: Unsupported sitting Lower Body Dressing: Simulated;+2 Total assistance Lower Body Dressing: Patient Percentage: 0% Where Assessed - Lower Body Dressing: Supported sit to stand Toilet Transfer: Simulated;+2 Total assistance Toilet Transfer: Patient Percentage: 40% Statistician Method: Stand pivot Toileting - Clothing Manipulation and Hygiene: Simulated;+2 Total assistance Toileting - Designer, fashion/clothing and Hygiene: Patient Percentage: 0% Where Assessed - Glass blower/designer Manipulation and Hygiene: Standing Equipment Used: Rolling walker ADL Comments: Pt states he was nauseous earlier this am but had nausea and pain medicine. Pt states he feels weak today. Pt with much difficulty stepping around to recliner and had to pull up chair behind him to sit. Cues and assist for more upright posture and maintain balance.    OT Diagnosis: Generalized weakness;Acute pain  OT Problem List: Decreased strength;Impaired balance (sitting and/or standing);Decreased knowledge of use of DME or AE;Pain;Decreased activity tolerance OT Treatment Interventions: Self-care/ADL training;Therapeutic activities;DME and/or AE instruction;Patient/family education   OT Goals Acute Rehab OT Goals OT Goal Formulation: With patient Time For Goal Achievement: 08/16/11 Potential to Achieve Goals: Good ADL Goals Pt Will Perform Grooming: with min assist;Unsupported;Sitting, edge of bed;Sitting, chair ADL Goal: Grooming - Progress: Goal set today Pt Will Perform Upper Body Bathing: with min assist;Unsupported;Sitting, edge of bed;Sitting, chair ADL Goal: Upper Body Bathing - Progress: Goal set today Pt Will Perform Lower Body Bathing: with max assist;Sit to stand from chair;Sit to stand from bed ADL Goal: Lower Body Bathing - Progress: Goal set today Pt Will Transfer to Toilet: with max assist;Stand pivot transfer;with DME ADL Goal: Toilet Transfer - Progress: Goal set today Pt Will Perform Toileting - Clothing Manipulation: with max assist;Standing Additional ADL Goal #1: pt will transfer to EOB in prep for ADl with mod assist ADL Goal: Additional Goal #1 - Progress: Goal set today  Visit Information  Last OT Received On: 08/09/11 Assistance Needed: +2    Subjective Data  Subjective: I had something for nausea earlier Patient Stated Goal: none stated. agreeable to get up with OT   Prior Functioning   Home Living Lives With: Spouse Available Help at Discharge: Skilled Nursing Facility    Cognition  Overall Cognitive Status: Appears within functional limits for tasks assessed/performed Arousal/Alertness: Awake/alert Behavior During Session: [facility] for tasks performed    Extremity/Trunk Assessment Right Upper Extremity Assessment RUE ROM/Strength/Tone: Deficits RUE ROM/Strength/Tone Deficits: shoulder flexion to 45 Left Upper Extremity Assessment LUE ROM/Strength/Tone: Deficits LUE ROM/Strength/Tone Deficits: shoulder flexion to 45   Mobility Bed Mobility Bed Mobility: Supine to Sit Supine to Sit: 1: +2 Total assist;HOB elevated Supine to Sit: Patient Percentage: 20% Details for Bed Mobility Assistance: assist for LEs to EOB and significant assist for trunk to upright and maintain balance as he came up to sitting. Transfers Transfers: Sit to Stand;Stand to Sit Sit to Stand: 1: +2 Total assist;From bed;From elevated surface;With upper extremity assist Sit to Stand: Patient Percentage: 40% Stand to Sit: 1: +2 Total assist;With upper extremity assist;To chair/3-in-1 Stand to Sit: Patient Percentage: 40% Details for Transfer Assistance: cues for use of UEs/placement and required assist to manage L LE out in front to sit.   Exercise    Balance    End of Session OT - End of Session Equipment Utilized During Treatment: Gait belt Activity Tolerance: Patient limited by fatigue;Patient limited by pain Patient left: in chair;with call bell/phone within reach       Lennox Laity [phone] 08/09/2011, 10:24 AM

## 2011-08-09 NOTE — Care Management Note (Signed)
    Page 1 of 2   08/09/2011     4:06:17 PM   CARE MANAGEMENT NOTE 08/09/2011  Patient:  BERTON, BUTRICK   Account Number:  [id number]  Date Initiated:  08/09/2011  Documentation initiated by:  Colleen Can  Subjective/Objective Assessment:   dx total left knee arthroplasty     Action/Plan:   SNF rehab/CSW following patient   Anticipated DC Date:  08/10/2011   Anticipated DC Plan:  SKILLED NURSING FACILITY  In-house referral  Clinical Social Worker      DC Planning Services  NA      [facility] Choice  NA   Choice offered to / List presented to:  NA   DME arranged  NA      DME agency  NA     HH arranged  NA      HH agency  NA   Status of service:  Completed, signed off Medicare Important Message given?   (If response is "NO", the following Medicare IM given date fields will be blank) Date Medicare IM given:   Date Additional Medicare IM given:    Discharge Disposition:    Per UR Regulation:    If discussed at Long Length of Stay Meetings, dates discussed:    Comments:

## 2011-08-09 NOTE — Progress Notes (Signed)
Physical Therapy Treatment Patient Details Name: Henry Alvarez MRN: [id number] DOB: Dec 24, 1924 Today's Date: 08/09/2011 Time: 4098-1191 PT Time Calculation (min): 26 min  PT Assessment / Plan / Recommendation Comments on Treatment Session  Pt fatigued but willing to follow through with ther ex    Follow Up Recommendations  Skilled nursing facility    Barriers to Discharge        Equipment Recommendations  Defer to next venue    Recommendations for Other Services OT consult  Frequency 7X/week   Plan Discharge plan remains appropriate    Precautions / Restrictions Precautions Precautions: Knee Required Braces or Orthoses: Knee Immobilizer - Left Knee Immobilizer - Left: Discontinue once straight leg raise with < 10 degree lag Restrictions Weight Bearing Restrictions: No Other Position/Activity Restrictions: WBAT   Pertinent Vitals/Pain 6/10 with there ex; Rn aware and providing MR, cold packs applied    Mobility       Exercises Total Joint Exercises Ankle Circles/Pumps: AROM;AAROM;Both;20 reps;Supine Quad Sets: AROM;AAROM;20 reps;Supine;Both Heel Slides: AAROM;20 reps;Supine;Left Straight Leg Raises: AAROM;Left;20 reps;Supine   PT Diagnosis:    PT Problem List:   PT Treatment Interventions:     PT Goals Acute Rehab PT Goals PT Goal Formulation: With patient Time For Goal Achievement: 08/14/11 Potential to Achieve Goals: Fair Pt will go Supine/Side to Sit: with min assist Pt will go Sit to Supine/Side: with min assist Pt will go Sit to Stand: with min assist Pt will go Stand to Sit: with min assist Pt will Ambulate: 16 - 50 feet;with min assist;with rolling walker  Visit Information  Last PT Received On: 08/09/11 Assistance Needed: +1    Subjective Data  Subjective: I don't have to get out of bed again do I? Patient Stated Goal: Rehab and then home with spouse   Cognition  Overall Cognitive Status: Appears within functional limits for tasks  assessed/performed Area of Impairment: Other (comment) Arousal/Alertness: Awake/alert Orientation Level: Appears intact for tasks assessed Behavior During Session: [facility] for tasks performed    Balance     End of Session PT - End of Session Activity Tolerance: Patient limited by fatigue Patient left: in bed;with call bell/phone within reach Nurse Communication: Mobility status   GP     Dj Senteno 08/09/2011, 3:52 PM
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
## 2011-08-10 DIAGNOSIS — D649 Anemia, unspecified: Secondary | ICD-10-CM | POA: Diagnosis not present

## 2011-08-10 DIAGNOSIS — I4891 Unspecified atrial fibrillation: Secondary | ICD-10-CM | POA: Diagnosis not present

## 2011-08-10 DIAGNOSIS — M1710 Unilateral primary osteoarthritis, unspecified knee: Secondary | ICD-10-CM | POA: Diagnosis not present

## 2011-08-10 DIAGNOSIS — E785 Hyperlipidemia, unspecified: Secondary | ICD-10-CM | POA: Diagnosis not present

## 2011-08-10 DIAGNOSIS — Z7901 Long term (current) use of anticoagulants: Secondary | ICD-10-CM | POA: Diagnosis not present

## 2011-08-10 DIAGNOSIS — Z5189 Encounter for other specified aftercare: Secondary | ICD-10-CM | POA: Diagnosis not present

## 2011-08-10 DIAGNOSIS — S99919A Unspecified injury of unspecified ankle, initial encounter: Secondary | ICD-10-CM | POA: Diagnosis not present

## 2011-08-10 DIAGNOSIS — Z96659 Presence of unspecified artificial knee joint: Secondary | ICD-10-CM | POA: Diagnosis not present

## 2011-08-10 DIAGNOSIS — I10 Essential (primary) hypertension: Secondary | ICD-10-CM | POA: Diagnosis not present

## 2011-08-10 DIAGNOSIS — I2510 Atherosclerotic heart disease of native coronary artery without angina pectoris: Secondary | ICD-10-CM | POA: Diagnosis not present

## 2011-08-10 DIAGNOSIS — D62 Acute posthemorrhagic anemia: Secondary | ICD-10-CM | POA: Diagnosis not present

## 2011-08-10 DIAGNOSIS — M25569 Pain in unspecified knee: Secondary | ICD-10-CM | POA: Diagnosis not present

## 2011-08-10 LAB — BASIC METABOLIC PANEL
Chloride: 99 mEq/L (ref 96–112)
GFR calc Af Amer: >90 mL/min (ref >90)
Potassium: 3.9 mEq/L (ref 3.5–5.1)

## 2011-08-10 LAB — PROTIME-INR
INR: 1.94 — ABNORMAL HIGH (ref 0.00–1.49)
Prothrombin Time: 22.5 seconds — ABNORMAL HIGH (ref 11.6–15.2)

## 2011-08-10 LAB — CBC
Hemoglobin: 8.4 g/dL — ABNORMAL LOW (ref 13.0–17.0)
RBC: 2.68 MIL/uL — ABNORMAL LOW (ref 4.22–5.81)

## 2011-08-10 MED ORDER — WARFARIN SODIUM 5 MG PO TABS: 5.0000 mg | ORAL_TABLET | Freq: Every day | ORAL | Status: DC

## 2011-08-10 NOTE — Progress Notes (Signed)
Physical Therapy Treatment Patient Details Name: Henry Alvarez MRN: [id number] DOB: 10/14/1924 Today's Date: 08/10/2011 Time: 4098-1191 PT Time Calculation (min): 42 min  PT Assessment / Plan / Recommendation Comments on Treatment Session       Follow Up Recommendations  Skilled nursing facility    Barriers to Discharge        Equipment Recommendations  Defer to next venue    Recommendations for Other Services OT consult  Frequency 7X/week   Plan Discharge plan remains appropriate    Precautions / Restrictions Precautions Precautions: Knee Required Braces or Orthoses: Knee Immobilizer - Left Knee Immobilizer - Left: Discontinue once straight leg raise with < 10 degree lag Restrictions Weight Bearing Restrictions: No Other Position/Activity Restrictions: WBAT   Pertinent Vitals/Pain 5/10; premedicated, cold packs provided    Mobility  Bed Mobility Bed Mobility: Sit to Supine;Supine to Sit Supine to Sit: 1: +2 Total assist;HOB elevated Supine to Sit: Patient Percentage: 20% Sit to Supine: 1: +2 Total assist Sit to Supine: Patient Percentage: 40% Details for Bed Mobility Assistance: assist for LEs to EOB and significant assist for trunk to upright and maintain balance as he came up to sitting. Transfers Transfers: Sit to Stand;Stand to Sit Sit to Stand: 1: +2 Total assist;From elevated surface;With upper extremity assist;From chair/3-in-1 Sit to Stand: Patient Percentage: 40% Stand to Sit: 1: +2 Total assist;With upper extremity assist;To bed Stand to Sit: Patient Percentage: 50% Details for Transfer Assistance: cues for use of UEs/placement and required assist to manage L LE out in front to sit. Ambulation/Gait Ambulation/Gait Assistance: 1: +2 Total assist Ambulation/Gait: Patient Percentage: 60% Ambulation Distance (Feet): 4 Feet Assistive device: Rolling walker Ambulation/Gait Assistance Details: cues for sequence, stride length, posture, increased UE WB  and  position from RW Gait Pattern: Step-to pattern Gait velocity: [id number]    Exercises Total Joint Exercises Ankle Circles/Pumps: AROM;AAROM;Both;20 reps;Supine Quad Sets: AROM;AAROM;20 reps;Supine;Both Heel Slides: AAROM;20 reps;Supine;Left Straight Leg Raises: AAROM;Left;20 reps;Supine   PT Diagnosis:    PT Problem List:   PT Treatment Interventions:     PT Goals Acute Rehab PT Goals PT Goal Formulation: With patient Time For Goal Achievement: 08/14/11 Potential to Achieve Goals: Fair Pt will go Supine/Side to Sit: with min assist PT Goal: Supine/Side to Sit - Progress: Progressing toward goal Pt will go Sit to Supine/Side: with min assist PT Goal: Sit to Supine/Side - Progress: Progressing toward goal Pt will go Sit to Stand: with min assist PT Goal: Sit to Stand - Progress: Progressing toward goal Pt will go Stand to Sit: with min assist PT Goal: Stand to Sit - Progress: Progressing toward goal Pt will Ambulate: 16 - 50 feet;with min assist;with rolling walker PT Goal: Ambulate - Progress: Progressing toward goal  Visit Information  Last PT Received On: 08/10/11 Assistance Needed: +1    Subjective Data  Subjective: I'm just trying to get more rest Patient Stated Goal: Rehab and then home with spouse   Cognition  Overall Cognitive Status: Appears within functional limits for tasks assessed/performed Area of Impairment: Other (comment) Arousal/Alertness: Awake/alert Orientation Level: Appears intact for tasks assessed Behavior During Session: [facility] for tasks performed    Balance     End of Session PT - End of Session Equipment Utilized During Treatment: Gait belt;Left knee immobilizer Activity Tolerance: Patient limited by fatigue Patient left: in bed;with call bell/phone within reach Nurse Communication: Mobility status   GP     Rogerio Boutelle 08/10/2011, 12:51 PM

## 2011-08-10 NOTE — Progress Notes (Signed)
ANTICOAGULATION CONSULT NOTE - Follow-up Consult  Pharmacy Consult for Warfarin Indication: PAF, Hx TIAs, s/p TKA  No Known Allergies  Patient Measurements: Height: 5\' 7"  (170.2 cm) Weight: 158 lb (71.668 kg) IBW/kg (Calculated) : 66.1 .  Vital Signs: Temp: 98.5 F (36.9 C) (07/04 0528) BP: 145/76 mmHg (07/04 0528) Pulse Rate: 63  (07/04 0528)  Labs:  Basename 08/10/11 0339 08/09/11 0355 08/08/11 0400  HGB 8.4* 9.2* --  HCT 24.5* 27.6* 23.2*  PLT 77* 75* 79*  APTT -- -- --  LABPROT 22.5* 18.9* 17.6*  INR 1.94* 1.55* 1.42  HEPARINUNFRC -- -- --  CREATININE 0.80 0.84 0.90  CKTOTAL -- -- --  CKMB -- -- --  TROPONINI -- -- --   Estimated Creatinine Clearance: 64.3 ml/min (by C-G formula based on Cr of 0.8).  Assessment:  78 y/o M on chronic warfarin for PAF and hx TIAs, reported dosage PTA 9mg  daily except 6mg  on Mondays.  Pt underwent L TKA on 7/1 and coumadin resumed post-op.  Pt also on concomitant Lovenox 30 mg sq q12h until INR >/= 1.8  INR 1.94 today, will discontinue Lovenox  Thrombocytopenia noted at baseline and appears to be stable  No bleeding/complications noted.  Goal of Therapy:  INR 2-3 Monitor platelets by anticoagulation protocol: Yes   Plan:   Discharge Summary completed by MD so will not enter Coumadin dose tonight.  MD is discharging patient on Coumadin 9 mg po daily to SNF.  MD contacted and recommended to resume PTA dose of 9 mg daily Except 6 mg on Mondays.  Discontinue Lovenox prophylactic-dose   RN to call pharmacy if patient not discharge by tonight.  Geoffry Paradise, PharmD, BCPS Pager: [phone] 9:20 AM

## 2011-08-10 NOTE — Progress Notes (Signed)
Subjective: 3 Days Post-Op Procedure(s) (LRB): TOTAL KNEE ARTHROPLASTY (Left) Patient reports pain as mild.   Denies CP or SOB.  Voiding without difficulty. Positive flatus. Objective: Vital signs in last 24 hours: Temp:  [98.1 F (36.7 C)-98.5 F (36.9 C)] 98.5 F (36.9 C) (07/04 0528) Pulse Rate:  [63-79] 63  (07/04 0528) Resp:  [14-20] 16  (07/04 0528) BP: (102-145)/(55-80) 145/76 mmHg (07/04 0528) SpO2:  [95 %-97 %] 95 % (07/04 0528)  Intake/Output from previous day: 07/03 0701 - 07/04 0700 In: 360 [P.O.:360] Out: 1600 [Urine:1600] Intake/Output this shift:     Basename 08/10/11 0339 08/09/11 0355 08/08/11 0400  HGB 8.4* 9.2* 7.8*    Basename 08/10/11 0339 08/09/11 0355  WBC 8.1 10.3  RBC 2.68* 3.00*  HCT 24.5* 27.6*  PLT 77* 75*    Basename 08/10/11 0339 08/09/11 0355  NA 132* 132*  K 3.9 3.7  CL 99 100  CO2 25 24  BUN 15 14  CREATININE 0.80 0.84  GLUCOSE 111* 120*  CALCIUM 8.4 8.4    Basename 08/10/11 0339 08/09/11 0355  LABPT -- --  INR 1.94* 1.55*    Neurologically intact Neurovascular intact Intact pulses distally Incision: dressing C/D/I Compartment soft  Assessment/Plan: 3 Days Post-Op Procedure(s) (LRB): TOTAL KNEE ARTHROPLASTY (Left) Discharge to SNF  Keshana Klemz 08/10/2011, 7:29 AM

## 2011-08-11 NOTE — Progress Notes (Signed)
Clinical Social Work Department CLINICAL SOCIAL WORK PLACEMENT NOTE 08/11/2011  Patient:  OWYN, RAULSTON  Account Number:  [id number] Admit date:  08/07/2011  Clinical Social Worker:  Cori Razor, LCSW  Date/time:  08/08/2011 04:00 PM  Clinical Social Work is seeking post-discharge placement for this patient at the following level of care:   SKILLED NURSING   (*CSW will update this form in Epic as items are completed)     Patient/family provided with [facility] Department of Clinical Social Work's list of facilities offering this level of care within the geographic area requested by the patient (or if unable, by the patient's family).    Patient/family informed of their freedom to choose among providers that offer the needed level of care, that participate in Medicare, Medicaid or managed care program needed by the patient, have an available bed and are willing to accept the patient.    Patient/family informed of [facility]' ownership interest in [facility], as well as of the fact that they are under no obligation to receive care at this facility.  PASARR submitted to EDS on 08/08/2011 PASARR number received from EDS on 08/08/2011  FL2 transmitted to all facilities in geographic area requested by pt/family on  08/08/2011 FL2 transmitted to all facilities within larger geographic area on   Patient informed that his/her managed care company has contracts with or will negotiate with  certain facilities, including the following:     Patient/family informed of bed offers received:  08/08/2011 Patient chooses bed at [facility]: [facility] [address] [facility] Physician recommends and patient chooses bed at    Patient to be transferred to [facility] on  08/10/2011 Patient to be transferred to facility by   The following physician request were entered in Epic:   Additional Comments: Pt has made prior arrangements to has [employer] at [facility].  Cori Razor LCSW [phone]

## 2011-08-14 ENCOUNTER — Encounter (HOSPITAL_COMMUNITY): Payer: Self-pay | Admitting: Orthopedic Surgery

## 2011-08-16 DIAGNOSIS — I10 Essential (primary) hypertension: Secondary | ICD-10-CM | POA: Diagnosis not present

## 2011-08-16 DIAGNOSIS — D62 Acute posthemorrhagic anemia: Secondary | ICD-10-CM | POA: Diagnosis not present

## 2011-08-16 DIAGNOSIS — I2510 Atherosclerotic heart disease of native coronary artery without angina pectoris: Secondary | ICD-10-CM | POA: Diagnosis not present

## 2011-09-11 DIAGNOSIS — M1710 Unilateral primary osteoarthritis, unspecified knee: Secondary | ICD-10-CM | POA: Diagnosis not present

## 2011-09-13 DIAGNOSIS — I4891 Unspecified atrial fibrillation: Secondary | ICD-10-CM | POA: Diagnosis not present

## 2011-09-13 DIAGNOSIS — R634 Abnormal weight loss: Secondary | ICD-10-CM | POA: Diagnosis not present

## 2011-09-13 DIAGNOSIS — M810 Age-related osteoporosis without current pathological fracture: Secondary | ICD-10-CM | POA: Diagnosis not present

## 2011-09-13 DIAGNOSIS — Z7901 Long term (current) use of anticoagulants: Secondary | ICD-10-CM | POA: Diagnosis not present

## 2011-09-13 DIAGNOSIS — Z79899 Other long term (current) drug therapy: Secondary | ICD-10-CM | POA: Diagnosis not present

## 2011-09-14 DIAGNOSIS — M1710 Unilateral primary osteoarthritis, unspecified knee: Secondary | ICD-10-CM | POA: Diagnosis not present

## 2011-09-18 DIAGNOSIS — M1710 Unilateral primary osteoarthritis, unspecified knee: Secondary | ICD-10-CM | POA: Diagnosis not present

## 2011-09-19 DIAGNOSIS — R358 Other polyuria: Secondary | ICD-10-CM | POA: Diagnosis not present

## 2011-09-21 DIAGNOSIS — I10 Essential (primary) hypertension: Secondary | ICD-10-CM | POA: Diagnosis not present

## 2011-09-21 DIAGNOSIS — I4891 Unspecified atrial fibrillation: Secondary | ICD-10-CM | POA: Diagnosis not present

## 2011-09-21 DIAGNOSIS — D696 Thrombocytopenia, unspecified: Secondary | ICD-10-CM | POA: Diagnosis not present

## 2011-09-21 DIAGNOSIS — I2510 Atherosclerotic heart disease of native coronary artery without angina pectoris: Secondary | ICD-10-CM | POA: Diagnosis not present

## 2011-09-26 DIAGNOSIS — M1710 Unilateral primary osteoarthritis, unspecified knee: Secondary | ICD-10-CM | POA: Diagnosis not present

## 2011-09-27 DIAGNOSIS — I4891 Unspecified atrial fibrillation: Secondary | ICD-10-CM | POA: Diagnosis not present

## 2011-09-27 DIAGNOSIS — Z7901 Long term (current) use of anticoagulants: Secondary | ICD-10-CM | POA: Diagnosis not present

## 2011-09-28 DIAGNOSIS — Z96659 Presence of unspecified artificial knee joint: Secondary | ICD-10-CM | POA: Diagnosis not present

## 2011-10-02 DIAGNOSIS — M1710 Unilateral primary osteoarthritis, unspecified knee: Secondary | ICD-10-CM | POA: Diagnosis not present

## 2011-10-05 DIAGNOSIS — M1710 Unilateral primary osteoarthritis, unspecified knee: Secondary | ICD-10-CM | POA: Diagnosis not present

## 2011-10-10 DIAGNOSIS — M1710 Unilateral primary osteoarthritis, unspecified knee: Secondary | ICD-10-CM | POA: Diagnosis not present

## 2011-10-17 DIAGNOSIS — M1710 Unilateral primary osteoarthritis, unspecified knee: Secondary | ICD-10-CM | POA: Diagnosis not present

## 2011-10-19 DIAGNOSIS — M1710 Unilateral primary osteoarthritis, unspecified knee: Secondary | ICD-10-CM | POA: Diagnosis not present

## 2011-10-24 DIAGNOSIS — M1710 Unilateral primary osteoarthritis, unspecified knee: Secondary | ICD-10-CM | POA: Diagnosis not present

## 2011-10-25 DIAGNOSIS — Z23 Encounter for immunization: Secondary | ICD-10-CM | POA: Diagnosis not present

## 2011-10-25 DIAGNOSIS — Z96659 Presence of unspecified artificial knee joint: Secondary | ICD-10-CM | POA: Diagnosis not present

## 2011-10-25 DIAGNOSIS — R358 Other polyuria: Secondary | ICD-10-CM | POA: Diagnosis not present

## 2011-10-25 DIAGNOSIS — D696 Thrombocytopenia, unspecified: Secondary | ICD-10-CM | POA: Diagnosis not present

## 2011-10-25 DIAGNOSIS — Z7901 Long term (current) use of anticoagulants: Secondary | ICD-10-CM | POA: Diagnosis not present

## 2011-10-31 DIAGNOSIS — M1710 Unilateral primary osteoarthritis, unspecified knee: Secondary | ICD-10-CM | POA: Diagnosis not present

## 2011-11-02 DIAGNOSIS — M1710 Unilateral primary osteoarthritis, unspecified knee: Secondary | ICD-10-CM | POA: Diagnosis not present

## 2011-11-10 DIAGNOSIS — M1710 Unilateral primary osteoarthritis, unspecified knee: Secondary | ICD-10-CM | POA: Diagnosis not present

## 2011-11-17 DIAGNOSIS — W19XXXA Unspecified fall, initial encounter: Secondary | ICD-10-CM | POA: Diagnosis not present

## 2011-11-17 DIAGNOSIS — D696 Thrombocytopenia, unspecified: Secondary | ICD-10-CM | POA: Diagnosis not present

## 2011-11-17 DIAGNOSIS — Z7901 Long term (current) use of anticoagulants: Secondary | ICD-10-CM | POA: Diagnosis not present

## 2011-11-30 DIAGNOSIS — I4891 Unspecified atrial fibrillation: Secondary | ICD-10-CM | POA: Diagnosis not present

## 2011-11-30 DIAGNOSIS — Z7901 Long term (current) use of anticoagulants: Secondary | ICD-10-CM | POA: Diagnosis not present

## 2011-12-01 DIAGNOSIS — M1710 Unilateral primary osteoarthritis, unspecified knee: Secondary | ICD-10-CM | POA: Diagnosis not present

## 2011-12-05 DIAGNOSIS — M1710 Unilateral primary osteoarthritis, unspecified knee: Secondary | ICD-10-CM | POA: Diagnosis not present

## 2011-12-08 DIAGNOSIS — M1710 Unilateral primary osteoarthritis, unspecified knee: Secondary | ICD-10-CM | POA: Diagnosis not present

## 2011-12-12 DIAGNOSIS — M1710 Unilateral primary osteoarthritis, unspecified knee: Secondary | ICD-10-CM | POA: Diagnosis not present

## 2011-12-15 DIAGNOSIS — M1710 Unilateral primary osteoarthritis, unspecified knee: Secondary | ICD-10-CM | POA: Diagnosis not present

## 2011-12-19 DIAGNOSIS — M1710 Unilateral primary osteoarthritis, unspecified knee: Secondary | ICD-10-CM | POA: Diagnosis not present

## 2011-12-22 DIAGNOSIS — M1710 Unilateral primary osteoarthritis, unspecified knee: Secondary | ICD-10-CM | POA: Diagnosis not present

## 2012-01-19 DIAGNOSIS — I4891 Unspecified atrial fibrillation: Secondary | ICD-10-CM | POA: Diagnosis not present

## 2012-01-19 DIAGNOSIS — Z7901 Long term (current) use of anticoagulants: Secondary | ICD-10-CM | POA: Diagnosis not present

## 2012-01-19 DIAGNOSIS — J309 Allergic rhinitis, unspecified: Secondary | ICD-10-CM | POA: Diagnosis not present

## 2012-01-19 DIAGNOSIS — R05 Cough: Secondary | ICD-10-CM | POA: Diagnosis not present

## 2012-01-25 DIAGNOSIS — M1710 Unilateral primary osteoarthritis, unspecified knee: Secondary | ICD-10-CM | POA: Diagnosis not present

## 2012-03-16 ENCOUNTER — Emergency Department (HOSPITAL_COMMUNITY): Payer: Medicare Other

## 2012-03-16 ENCOUNTER — Emergency Department (HOSPITAL_COMMUNITY)
Admission: EM | Admit: 2012-03-16 | Discharge: 2012-03-16 | Disposition: A | Discharge status: Home / Self Care | Payer: Medicare Other | Attending: Emergency Medicine | Admitting: Emergency Medicine

## 2012-03-16 DIAGNOSIS — Z8679 Personal history of other diseases of the circulatory system: Secondary | ICD-10-CM | POA: Diagnosis not present

## 2012-03-16 DIAGNOSIS — S0990XA Unspecified injury of head, initial encounter: Secondary | ICD-10-CM | POA: Diagnosis not present

## 2012-03-16 DIAGNOSIS — S46909A Unspecified injury of unspecified muscle, fascia and tendon at shoulder and upper arm level, unspecified arm, initial encounter: Secondary | ICD-10-CM | POA: Insufficient documentation

## 2012-03-16 DIAGNOSIS — Y92009 Unspecified place in unspecified non-institutional (private) residence as the place of occurrence of the external cause: Secondary | ICD-10-CM

## 2012-03-16 DIAGNOSIS — W230XXA Caught, crushed, jammed, or pinched between moving objects, initial encounter: Secondary | ICD-10-CM | POA: Insufficient documentation

## 2012-03-16 DIAGNOSIS — R791 Abnormal coagulation profile: Secondary | ICD-10-CM | POA: Diagnosis not present

## 2012-03-16 DIAGNOSIS — S4980XA Other specified injuries of shoulder and upper arm, unspecified arm, initial encounter: Secondary | ICD-10-CM | POA: Insufficient documentation

## 2012-03-16 DIAGNOSIS — Z7901 Long term (current) use of anticoagulants: Secondary | ICD-10-CM | POA: Diagnosis not present

## 2012-03-16 DIAGNOSIS — M129 Arthropathy, unspecified: Secondary | ICD-10-CM | POA: Diagnosis not present

## 2012-03-16 DIAGNOSIS — S79919A Unspecified injury of unspecified hip, initial encounter: Secondary | ICD-10-CM | POA: Diagnosis not present

## 2012-03-16 DIAGNOSIS — S79929A Unspecified injury of unspecified thigh, initial encounter: Secondary | ICD-10-CM | POA: Diagnosis not present

## 2012-03-16 DIAGNOSIS — Z79899 Other long term (current) drug therapy: Secondary | ICD-10-CM | POA: Diagnosis not present

## 2012-03-16 DIAGNOSIS — M25519 Pain in unspecified shoulder: Secondary | ICD-10-CM | POA: Diagnosis not present

## 2012-03-16 DIAGNOSIS — Y929 Unspecified place or not applicable: Secondary | ICD-10-CM | POA: Insufficient documentation

## 2012-03-16 DIAGNOSIS — Y9389 Activity, other specified: Secondary | ICD-10-CM | POA: Insufficient documentation

## 2012-03-16 DIAGNOSIS — M25559 Pain in unspecified hip: Secondary | ICD-10-CM | POA: Diagnosis not present

## 2012-03-16 LAB — CBC WITH DIFFERENTIAL/PLATELET
Basophils Relative: 1 % (ref 0–1)
Eosinophils Absolute: 0 10*3/uL (ref 0.0–0.7)
Hemoglobin: 11.8 g/dL — ABNORMAL LOW (ref 13.0–17.0)
Lymphs Abs: 1 10*3/uL (ref 0.7–4.0)
MCH: 31.1 pg (ref 26.0–34.0)
MCHC: 32.8 g/dL (ref 30.0–36.0)
MCV: 94.7 fL (ref 78.0–100.0)
Monocytes Absolute: 0.7 10*3/uL (ref 0.1–1.0)
Neutro Abs: 2.7 10*3/uL (ref 1.7–7.7)
Neutrophils Relative %: 58 % (ref 43–77)

## 2012-03-16 LAB — PROTIME-INR: Prothrombin Time: 17.6 seconds — ABNORMAL HIGH (ref 11.6–15.2)

## 2012-03-16 LAB — BASIC METABOLIC PANEL
BUN: 23 mg/dL (ref 6–23)
CO2: 26 mEq/L (ref 19–32)
Calcium: 8.7 mg/dL (ref 8.4–10.5)
Creatinine, Ser: 0.9 mg/dL (ref 0.50–1.35)
GFR calc non Af Amer: 75 mL/min — ABNORMAL LOW (ref >90)
Glucose, Bld: 107 mg/dL — ABNORMAL HIGH (ref 70–99)

## 2012-03-16 NOTE — ED Provider Notes (Signed)
Medical screening examination/treatment/procedure(s) were performed by non-physician practitioner and as supervising physician I was immediately available for consultation/collaboration.  See paper chart for complete H/P  Sunnie Nielsen, MD 03/16/12 [id number]

## 2012-03-16 NOTE — ED Provider Notes (Signed)
7:16 AM Handoff from [facility].   X-ray images and labs reviewed by Dr. Dierdre Highman and myself.   Patient seen and examined. Informed of low coumadin level. Pt states that he missed a dose.   Sling by ortho tech/nursing.   Patient was counseled on RICE protocol and told to rest injury, use ice for no longer than 15 minutes every hour, compress the area, and elevate above the level of their heart as much as possible to reduce swelling.  Questions answered.  Patient verbalized understanding.    He will f/u with Dr. Evlyn Kanner and Dr. Lequita Halt as needed.   Patient urged to return with worsening symptoms or other concerns. Patient verbalized understanding and agrees with plan.    [place], [place] 03/16/12 [phone]

## 2012-04-01 DIAGNOSIS — Z79899 Other long term (current) drug therapy: Secondary | ICD-10-CM | POA: Diagnosis not present

## 2012-04-01 DIAGNOSIS — D696 Thrombocytopenia, unspecified: Secondary | ICD-10-CM | POA: Diagnosis not present

## 2012-04-01 DIAGNOSIS — I4891 Unspecified atrial fibrillation: Secondary | ICD-10-CM | POA: Diagnosis not present

## 2012-04-01 DIAGNOSIS — E785 Hyperlipidemia, unspecified: Secondary | ICD-10-CM | POA: Diagnosis not present

## 2012-04-16 DIAGNOSIS — Z7901 Long term (current) use of anticoagulants: Secondary | ICD-10-CM | POA: Diagnosis not present

## 2012-04-16 DIAGNOSIS — I4891 Unspecified atrial fibrillation: Secondary | ICD-10-CM | POA: Diagnosis not present

## 2012-05-02 DIAGNOSIS — I4891 Unspecified atrial fibrillation: Secondary | ICD-10-CM | POA: Diagnosis not present

## 2012-05-02 DIAGNOSIS — Z7901 Long term (current) use of anticoagulants: Secondary | ICD-10-CM | POA: Diagnosis not present

## 2012-06-05 DIAGNOSIS — I4891 Unspecified atrial fibrillation: Secondary | ICD-10-CM | POA: Diagnosis not present

## 2012-06-05 DIAGNOSIS — Z7901 Long term (current) use of anticoagulants: Secondary | ICD-10-CM | POA: Diagnosis not present

## 2012-06-14 DIAGNOSIS — M1710 Unilateral primary osteoarthritis, unspecified knee: Secondary | ICD-10-CM | POA: Diagnosis not present

## 2012-06-15 ENCOUNTER — Other Ambulatory Visit: Payer: Self-pay | Admitting: Cardiology

## 2012-06-20 DIAGNOSIS — I4891 Unspecified atrial fibrillation: Secondary | ICD-10-CM | POA: Diagnosis not present

## 2012-06-20 DIAGNOSIS — Z7901 Long term (current) use of anticoagulants: Secondary | ICD-10-CM | POA: Diagnosis not present

## 2012-06-27 ENCOUNTER — Ambulatory Visit (INDEPENDENT_AMBULATORY_CARE_PROVIDER_SITE_OTHER): Payer: Medicare Other | Admitting: Family Medicine

## 2012-06-27 ENCOUNTER — Ambulatory Visit: Payer: Medicare Other

## 2012-06-27 VITALS — BP 130/60 | HR 59 | Temp 98.3°F | Resp 16 | Ht 67.0 in | Wt 150.0 lb

## 2012-06-27 DIAGNOSIS — S7000XA Contusion of unspecified hip, initial encounter: Secondary | ICD-10-CM | POA: Diagnosis not present

## 2012-06-27 DIAGNOSIS — IMO0002 Reserved for concepts with insufficient information to code with codable children: Secondary | ICD-10-CM | POA: Diagnosis not present

## 2012-06-27 DIAGNOSIS — W19XXXA Unspecified fall, initial encounter: Secondary | ICD-10-CM

## 2012-06-27 DIAGNOSIS — T148XXA Other injury of unspecified body region, initial encounter: Secondary | ICD-10-CM

## 2012-06-27 DIAGNOSIS — S7001XA Contusion of right hip, initial encounter: Secondary | ICD-10-CM

## 2012-06-27 MED ORDER — TRAMADOL-ACETAMINOPHEN 37.5-325 MG PO TABS: 1.0000 | ORAL_TABLET | Freq: Four times a day (QID) | ORAL | Status: DC | PRN

## 2012-06-27 NOTE — Patient Instructions (Addendum)
Hip Exercises  RANGE OF MOTION (ROM) AND STRETCHING EXERCISES   These exercises may help you when beginning to rehabilitate your injury. Doing them too aggressively can worsen your condition. Complete them slowly and gently. Your symptoms may resolve with or without further involvement from your physician, physical therapist or athletic trainer. While completing these exercises, remember:   · Restoring tissue flexibility helps normal motion to return to the joints. This allows healthier, less painful movement and activity.  · An effective stretch should be held for at least 30 seconds.  · A stretch should never be painful. You should only feel a gentle lengthening or release in the stretched tissue. If these stretches worsen your symptoms even when done gently, consult your physician, physical therapist or athletic trainer.  STRETCH Hamstrings, Supine   · Lie on your back. Loop a belt or towel over the ball of your right / left foot.  · Straighten your right / left knee and slowly pull on the belt to raise your leg. Do not allow the right / left knee to bend. Keep your opposite leg flat on the floor.  · Raise the leg until you feel a gentle stretch behind your right / left knee or thigh. Hold this position for __________ seconds.  Repeat __________ times. Complete this stretch __________ times per day.   STRETCH - Hip Rotators   · Lie on your back on a firm surface. Grasp your right / left knee with your right / left hand and your ankle with your opposite hand.  · Keeping your hips and shoulders firmly planted, gently pull your right / left knee and rotate your lower leg toward your opposite shoulder until you feel a stretch in your buttocks.  · Hold this stretch for __________ seconds.  Repeat this stretch __________ times. Complete this stretch __________ times per day.  STRETCH - Hamstrings/Adductors, V-Sit   · Sit on the floor with your legs extended in a large "V," keeping your knees straight.  · With your head  and chest upright, bend at your waist reaching for your right foot to stretch your left adductors.  · You should feel a stretch in your left inner thigh. Hold for __________ seconds.  · Return to the upright position to relax your leg muscles.  · Continuing to keep your chest upright, bend straight forward at your waist to stretch your hamstrings.  · You should feel a stretch behind both of your thighs and/or knees. Hold for __________ seconds.  · Return to the upright position to relax your leg muscles.  · Repeat steps 2 through 4 for opposite leg.  Repeat __________ times. Complete this exercise __________ times per day.   STRETCHING - Hip Flexors, Lunge  · Half kneel with your right / left knee on the floor and your opposite knee bent and directly over your ankle.  · Keep good posture with your head over your shoulders. Tighten your buttocks to point your tailbone downward; this will prevent your back from arching too much.  · You should feel a gentle stretch in the front of your thigh and/or hip. If you do not feel any resistance, slightly slide your opposite foot forward and then slowly lunge forward so your knee once again lines up over your ankle. Be sure your tailbone remains pointed downward.  · Hold this stretch for __________ seconds.  Repeat __________ times. Complete this stretch __________ times per day.  STRENGTHENING EXERCISES  These exercises may help you   when beginning to rehabilitate your injury. They may resolve your symptoms with or without further involvement from your physician, physical therapist or athletic trainer. While completing these exercises, remember:   · Muscles can gain both the endurance and the strength needed for everyday activities through controlled exercises.  · Complete these exercises as instructed by your physician, physical therapist or athletic trainer. Progress the resistance and repetitions only as guided.  · You may experience muscle soreness or fatigue, but the pain  or discomfort you are trying to eliminate should never worsen during these exercises. If this pain does worsen, stop and make certain you are following the directions exactly. If the pain is still present after adjustments, discontinue the exercise until you can discuss the trouble with your clinician.  STRENGTH - Hip Extensors, Bridge   · Lie on your back on a firm surface. Bend your knees and place your feet flat on the floor.  · Tighten your buttocks muscles and lift your bottom off the floor until your trunk is level with your thighs. You should feel the muscles in your buttocks and back of your thighs working. If you do not feel these muscles, slide your feet 1-2 inches further away from your buttocks.  · Hold this position for __________ seconds.  · Slowly lower your hips to the starting position and allow your buttock muscles relax completely before beginning the next repetition.  · If this exercise is too easy, you may cross your arms over your chest.  Repeat __________ times. Complete this exercise __________ times per day.   STRENGTH - Hip Abductors, Straight Leg Raises   Be aware of your form throughout the entire exercise so that you exercise the correct muscles. Sloppy form means that you are not strengthening the correct muscles.  · Lie on your side so that your head, shoulders, knee and hip line up. You may bend your lower knee to help maintain your balance. Your right / left leg should be on top.  · Roll your hips slightly forward, so that your hips are stacked directly over each other and your right / left knee is facing forward.  · Lift your top leg up 4-6 inches, leading with your heel. Be sure that your foot does not drift forward or that your knee does not roll toward the ceiling.  · Hold this position for __________ seconds. You should feel the muscles in your outer hip lifting (you may not notice this until your leg begins to tire).  · Slowly lower your leg to the starting position. Allow the  muscles to fully relax before beginning the next repetition.  Repeat __________ times. Complete this exercise __________ times per day.   STRENGTH - Hip Adductors, Straight Leg Raises   · Lie on your side so that your head, shoulders, knee and hip line up. You may place your upper foot in front to help maintain your balance. Your right / left leg should be on the bottom.  · Roll your hips slightly forward, so that your hips are stacked directly over each other and your right / left knee is facing forward.  · Tense the muscles in your inner thigh and lift your bottom leg 4-6 inches. Hold this position for __________ seconds.  · Slowly lower your leg to the starting position. Allow the muscles to fully relax before beginning the next repetition.  Repeat __________ times. Complete this exercise __________ times per day.   STRENGTH - Quadriceps, Straight Leg Raises     Quality counts! Watch for signs that the quadriceps muscle is working to insure you are strengthening the correct muscles and not "cheating" by substituting with healthier muscles.  · Lay on your back with your right / left leg extended and your opposite knee bent.  · Tense the muscles in the front of your right / left thigh. You should see either your knee cap slide up or increased dimpling just above the knee. Your thigh may even quiver.  · Tighten these muscles even more and raise your leg 4 to 6 inches off the floor. Hold for right / left seconds.  · Keeping these muscles tense, lower your leg.  · Relax the muscles slowly and completely in between each repetition.  Repeat __________ times. Complete this exercise __________ times per day.   STRENGTH - Hip Abductors, Standing  · Tie one end of a rubber exercise band/tubing to a secure surface (table, pole) and tie a loop at the other end.  · Place the loop around your right / left ankle. Keeping your ankle with the band directly opposite of the secured end, step away until there is tension in the  tube/band.  · Hold onto a chair as needed for balance.  · Keeping your back upright, your shoulders over your hips, and your toes pointing forward, lift your right / left leg out to your side. Be sure to lift your leg with your hip muscles. Do not "throw" your leg or tip your body to lift your leg.  · Slowly and with control, return to the starting position.  Repeat exercise __________ times. Complete this exercise __________ times per day.   STRENGTH  Quadriceps, Squats  · Stand in a door frame so that your feet and knees are in line with the frame.  · Use your hands for balance, not support, on the frame.  · Slowly lower your weight, bending at the hips and knees. Keep your lower legs upright so that they are parallel with the door frame. Squat only within the range that does not increase your knee pain. Never let your hips drop below your knees.  · Slowly return upright, pushing with your legs, not pulling with your hands.  Document Released: 02/10/2005 Document Revised: 04/17/2011 Document Reviewed: 05/07/2008  [employer]® Patient Information ©2014 [employer].
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
## 2012-07-08 DIAGNOSIS — R490 Dysphonia: Secondary | ICD-10-CM | POA: Diagnosis not present

## 2012-07-08 DIAGNOSIS — E785 Hyperlipidemia, unspecified: Secondary | ICD-10-CM | POA: Diagnosis not present

## 2012-07-08 DIAGNOSIS — D696 Thrombocytopenia, unspecified: Secondary | ICD-10-CM | POA: Diagnosis not present

## 2012-07-08 DIAGNOSIS — I4891 Unspecified atrial fibrillation: Secondary | ICD-10-CM | POA: Diagnosis not present

## 2012-07-08 DIAGNOSIS — I10 Essential (primary) hypertension: Secondary | ICD-10-CM | POA: Diagnosis not present

## 2012-07-08 DIAGNOSIS — I2510 Atherosclerotic heart disease of native coronary artery without angina pectoris: Secondary | ICD-10-CM | POA: Diagnosis not present

## 2012-07-08 DIAGNOSIS — Z7901 Long term (current) use of anticoagulants: Secondary | ICD-10-CM | POA: Diagnosis not present

## 2012-08-06 DIAGNOSIS — J383 Other diseases of vocal cords: Secondary | ICD-10-CM | POA: Diagnosis not present

## 2012-08-06 DIAGNOSIS — J310 Chronic rhinitis: Secondary | ICD-10-CM | POA: Diagnosis not present

## 2012-08-06 DIAGNOSIS — R05 Cough: Secondary | ICD-10-CM | POA: Diagnosis not present

## 2012-08-06 DIAGNOSIS — R490 Dysphonia: Secondary | ICD-10-CM | POA: Diagnosis not present

## 2012-08-12 DIAGNOSIS — J387 Other diseases of larynx: Secondary | ICD-10-CM | POA: Diagnosis not present

## 2012-08-12 DIAGNOSIS — R490 Dysphonia: Secondary | ICD-10-CM | POA: Diagnosis not present

## 2012-08-19 ENCOUNTER — Ambulatory Visit (INDEPENDENT_AMBULATORY_CARE_PROVIDER_SITE_OTHER): Payer: Medicare Other | Admitting: Neurology

## 2012-08-19 ENCOUNTER — Encounter: Payer: Self-pay | Admitting: Neurology

## 2012-08-19 VITALS — BP 134/72 | HR 54 | Temp 97.4°F | Wt 146.0 lb

## 2012-08-19 DIAGNOSIS — G20 Parkinson's disease: Secondary | ICD-10-CM | POA: Diagnosis not present

## 2012-08-19 MED ORDER — CARBIDOPA-LEVODOPA 25-100 MG PO TABS: ORAL_TABLET | ORAL | Status: DC

## 2012-08-19 NOTE — Patient Instructions (Addendum)
I think you have Parkinson's disease.  1.  Start carbidopa (Sinemet) 25/100mg  tablets.  Day 1-3: Take 0.5 tablet in morning and 0.5 tablet in evening/bedtime.  Day 4-7: Take 0.5 tablet in morning, 0.5 tablet in afternoon, and 0.5 tablet in evening/bedtime.  Week 2 & thereafter: Take 1 tablet three times daily.  Take the medication at the same time everyday.  The medication does not get absorbed into your body as well, if you take it with protein-containing foods (meat, dairy, beans).  Try taking the medication about one hour before meals.  If you experience nausea by taking it on an empty stomach, you may take it with carbohydrate-containing food,such as bread.    Side effects to look out for, include dizziness, nausea, vivid dreams and hallucinations.  If you experience any of these symptoms, please call us.  2.  We will refer you to neuro-rehab for physical therapy and speech therapy. They are located at [address] [employer] [address] in [place]. They will call you to make the appointment.   [phone].  3.  You must use your walker for ambulation.  You are a high fall risk and at increase risk for internal bleeding from a fall, due to the warfarin.  4.  Follow up in clinic in 3 months.
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
## 2012-08-20 ENCOUNTER — Other Ambulatory Visit: Payer: Self-pay | Admitting: Neurology

## 2012-08-20 ENCOUNTER — Encounter (HOSPITAL_COMMUNITY): Payer: Self-pay | Admitting: Pharmacy Technician

## 2012-08-20 DIAGNOSIS — G20 Parkinson's disease: Secondary | ICD-10-CM

## 2012-08-20 DIAGNOSIS — R269 Unspecified abnormalities of gait and mobility: Secondary | ICD-10-CM

## 2012-08-23 ENCOUNTER — Encounter (HOSPITAL_COMMUNITY)
Admission: RE | Admit: 2012-08-23 | Discharge: 2012-08-23 | Disposition: A | Discharge status: Home / Self Care | Payer: Medicare Other | Source: Ambulatory Visit | Attending: Otolaryngology | Admitting: Otolaryngology

## 2012-08-23 ENCOUNTER — Other Ambulatory Visit (HOSPITAL_COMMUNITY): Payer: Self-pay | Admitting: Otolaryngology

## 2012-08-23 ENCOUNTER — Encounter (HOSPITAL_COMMUNITY): Payer: Self-pay

## 2012-08-23 ENCOUNTER — Ambulatory Visit (HOSPITAL_COMMUNITY)
Admission: RE | Admit: 2012-08-23 | Discharge: 2012-08-23 | Disposition: A | Discharge status: Home / Self Care | Payer: Medicare Other | Source: Ambulatory Visit | Attending: Anesthesiology | Admitting: Anesthesiology

## 2012-08-23 DIAGNOSIS — Z7901 Long term (current) use of anticoagulants: Secondary | ICD-10-CM | POA: Diagnosis not present

## 2012-08-23 DIAGNOSIS — I10 Essential (primary) hypertension: Secondary | ICD-10-CM | POA: Diagnosis not present

## 2012-08-23 DIAGNOSIS — J45909 Unspecified asthma, uncomplicated: Secondary | ICD-10-CM | POA: Diagnosis not present

## 2012-08-23 DIAGNOSIS — Z01818 Encounter for other preprocedural examination: Secondary | ICD-10-CM | POA: Diagnosis not present

## 2012-08-23 DIAGNOSIS — Z85828 Personal history of other malignant neoplasm of skin: Secondary | ICD-10-CM | POA: Diagnosis not present

## 2012-08-23 DIAGNOSIS — I4891 Unspecified atrial fibrillation: Secondary | ICD-10-CM | POA: Diagnosis not present

## 2012-08-23 DIAGNOSIS — M1710 Unilateral primary osteoarthritis, unspecified knee: Secondary | ICD-10-CM | POA: Diagnosis not present

## 2012-08-23 DIAGNOSIS — Z79899 Other long term (current) drug therapy: Secondary | ICD-10-CM | POA: Diagnosis not present

## 2012-08-23 DIAGNOSIS — I2510 Atherosclerotic heart disease of native coronary artery without angina pectoris: Secondary | ICD-10-CM | POA: Diagnosis not present

## 2012-08-23 DIAGNOSIS — E785 Hyperlipidemia, unspecified: Secondary | ICD-10-CM | POA: Diagnosis not present

## 2012-08-23 DIAGNOSIS — Z9861 Coronary angioplasty status: Secondary | ICD-10-CM | POA: Diagnosis not present

## 2012-08-23 DIAGNOSIS — Z8673 Personal history of transient ischemic attack (TIA), and cerebral infarction without residual deficits: Secondary | ICD-10-CM | POA: Diagnosis not present

## 2012-08-23 DIAGNOSIS — N400 Enlarged prostate without lower urinary tract symptoms: Secondary | ICD-10-CM | POA: Diagnosis not present

## 2012-08-23 DIAGNOSIS — R490 Dysphonia: Secondary | ICD-10-CM | POA: Diagnosis not present

## 2012-08-23 LAB — CBC
HCT: 36 % — ABNORMAL LOW (ref 39.0–52.0)
Hemoglobin: 12.1 g/dL — ABNORMAL LOW (ref 13.0–17.0)
RDW: 13.7 % (ref 11.5–15.5)
WBC: 5.1 10*3/uL (ref 4.0–10.5)

## 2012-08-23 LAB — BASIC METABOLIC PANEL
Chloride: 106 mEq/L (ref 96–112)
GFR calc Af Amer: 71 mL/min — ABNORMAL LOW (ref >90)
Potassium: 4 mEq/L (ref 3.5–5.1)
Sodium: 141 mEq/L (ref 135–145)

## 2012-08-23 LAB — HEPATIC FUNCTION PANEL
ALT: <5 U/L (ref 0–53)
AST: 22 U/L (ref 0–37)
Indirect Bilirubin: 0.5 mg/dL (ref 0.3–0.9)
Total Protein: 6.5 g/dL (ref 6.0–8.3)

## 2012-08-23 LAB — APTT: aPTT: 30 seconds (ref 24–37)

## 2012-08-23 LAB — PROTIME-INR: INR: 1.31 (ref 0.00–1.49)

## 2012-08-23 NOTE — Progress Notes (Signed)
Dr Jenne Pane office made aware that orders need to be signed and released.

## 2012-08-23 NOTE — Progress Notes (Signed)
Talked to nurse at Dr Evlyn Kanner office, Pt to stop Coumadin 3 days prior to surgery.

## 2012-08-23 NOTE — Pre-Procedure Instructions (Signed)
Henry Alvarez  08/23/2012   Your procedure is scheduled on: Monday, July  21st  Report to [facility] Stay [facility] at 5:30AM.  Call this number if you have problems the morning of surgery: [phone]   Remember:   Do not eat food or drink liquids after midnight.   Take these medicines the morning of surgery with A SIP OF WATER: amLODipine (NORVASC), metoprolol (LOPRESSOR),Tamsulosin HCl (FLOMAX).  May take :traMADol-acetaminophen (ULTRACET) if needed for pain and ipratropium (ATROVENT) 0.06 % nasal spray if needed for runny nose.   Do not wear jewelry, make-up or nail polish.  Do not wear lotions, powders, or perfumes. You may wear deodorant.              Men may shave face and neck.  Do not bring valuables to the hospital.  [facility] is not responsible for any belongings or valuables.  Contacts, dentures or bridgework may not be worn into surgery.  Leave suitcase in the car. After surgery it may be brought to your room.  For patients admitted to the hospital, checkout time is 11:00 AM the day of discharge.   Patients discharged the day of surgery will not be allowed to drive home.  Name and phone number of your driver: -   Special Instructions: Shower using CHG 2 nights before surgery and the night before surgery.  If you shower the day of surgery use CHG.  Use special wash - you have one bottle of CHG for all showers.  You should use approximately 1/3 of the bottle for each shower.   Please read over the following fact sheets that you were given: Pain Booklet, Coughing and Deep Breathing and Surgical Site Infection Prevention
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
## 2012-08-26 ENCOUNTER — Encounter (HOSPITAL_COMMUNITY): Payer: Self-pay | Admitting: *Deleted

## 2012-08-26 ENCOUNTER — Encounter (HOSPITAL_COMMUNITY)
Admission: RE | Disposition: A | Discharge status: Home / Self Care | Payer: Self-pay | Source: Ambulatory Visit | Attending: Otolaryngology

## 2012-08-26 ENCOUNTER — Ambulatory Visit (HOSPITAL_COMMUNITY)
Admission: RE | Admit: 2012-08-26 | Discharge: 2012-08-26 | Disposition: A | Discharge status: Home / Self Care | Payer: Medicare Other | Source: Ambulatory Visit | Attending: Otolaryngology | Admitting: Otolaryngology

## 2012-08-26 ENCOUNTER — Ambulatory Visit (HOSPITAL_COMMUNITY): Payer: Medicare Other | Admitting: Anesthesiology

## 2012-08-26 ENCOUNTER — Encounter (HOSPITAL_COMMUNITY): Payer: Self-pay | Admitting: Vascular Surgery

## 2012-08-26 DIAGNOSIS — J45909 Unspecified asthma, uncomplicated: Secondary | ICD-10-CM | POA: Insufficient documentation

## 2012-08-26 DIAGNOSIS — Z85828 Personal history of other malignant neoplasm of skin: Secondary | ICD-10-CM | POA: Insufficient documentation

## 2012-08-26 DIAGNOSIS — E785 Hyperlipidemia, unspecified: Secondary | ICD-10-CM | POA: Insufficient documentation

## 2012-08-26 DIAGNOSIS — N189 Chronic kidney disease, unspecified: Secondary | ICD-10-CM | POA: Diagnosis not present

## 2012-08-26 DIAGNOSIS — I10 Essential (primary) hypertension: Secondary | ICD-10-CM | POA: Insufficient documentation

## 2012-08-26 DIAGNOSIS — J387 Other diseases of larynx: Secondary | ICD-10-CM | POA: Diagnosis not present

## 2012-08-26 DIAGNOSIS — R490 Dysphonia: Secondary | ICD-10-CM | POA: Diagnosis not present

## 2012-08-26 DIAGNOSIS — Z79899 Other long term (current) drug therapy: Secondary | ICD-10-CM | POA: Insufficient documentation

## 2012-08-26 DIAGNOSIS — Z9861 Coronary angioplasty status: Secondary | ICD-10-CM | POA: Insufficient documentation

## 2012-08-26 DIAGNOSIS — I471 Supraventricular tachycardia: Secondary | ICD-10-CM | POA: Diagnosis not present

## 2012-08-26 DIAGNOSIS — I2510 Atherosclerotic heart disease of native coronary artery without angina pectoris: Secondary | ICD-10-CM | POA: Diagnosis not present

## 2012-08-26 DIAGNOSIS — N400 Enlarged prostate without lower urinary tract symptoms: Secondary | ICD-10-CM | POA: Insufficient documentation

## 2012-08-26 DIAGNOSIS — M1710 Unilateral primary osteoarthritis, unspecified knee: Secondary | ICD-10-CM | POA: Insufficient documentation

## 2012-08-26 DIAGNOSIS — Z8673 Personal history of transient ischemic attack (TIA), and cerebral infarction without residual deficits: Secondary | ICD-10-CM | POA: Insufficient documentation

## 2012-08-26 DIAGNOSIS — I4891 Unspecified atrial fibrillation: Secondary | ICD-10-CM | POA: Insufficient documentation

## 2012-08-26 DIAGNOSIS — Z7901 Long term (current) use of anticoagulants: Secondary | ICD-10-CM | POA: Insufficient documentation

## 2012-08-26 HISTORY — PX: LARYNGOSCOPY: SHX5203

## 2012-08-26 SURGERY — LARYNGOSCOPY
Anesthesia: General | Site: Mouth | Wound class: Clean Contaminated

## 2012-08-26 MED ORDER — OXYCODONE HCL 5 MG/5ML PO SOLN: 5.0000 mg | Freq: Once | ORAL | Status: DC | PRN

## 2012-08-26 MED ORDER — SUCCINYLCHOLINE CHLORIDE 20 MG/ML IJ SOLN
250.0000 mg | INTRAVENOUS | Status: DC | PRN
  Administered 2012-08-26: 3 mg/min via INTRAVENOUS

## 2012-08-26 MED ORDER — SUCCINYLCHOLINE CHLORIDE 20 MG/ML IJ SOLN
INTRAMUSCULAR | Status: DC | PRN
  Administered 2012-08-26: 100 mg via INTRAVENOUS

## 2012-08-26 MED ORDER — OXYMETAZOLINE HCL 0.05 % NA SOLN
NASAL | Status: AC
  Filled 2012-08-26: qty 15

## 2012-08-26 MED ORDER — ARTIFICIAL TEARS OP OINT
TOPICAL_OINTMENT | OPHTHALMIC | Status: DC | PRN
  Administered 2012-08-26: 1 via OPHTHALMIC

## 2012-08-26 MED ORDER — DEXAMETHASONE SODIUM PHOSPHATE 4 MG/ML IJ SOLN
INTRAMUSCULAR | Status: DC | PRN
  Administered 2012-08-26: 8 mg via INTRAVENOUS

## 2012-08-26 MED ORDER — PROPOFOL 10 MG/ML IV BOLUS
INTRAVENOUS | Status: DC | PRN
  Administered 2012-08-26: 100 mg via INTRAVENOUS

## 2012-08-26 MED ORDER — PROPOFOL INFUSION 10 MG/ML OPTIME
INTRAVENOUS | Status: DC | PRN
  Administered 2012-08-26: 140 ug/kg/min via INTRAVENOUS

## 2012-08-26 MED ORDER — HYDROMORPHONE HCL PF 1 MG/ML IJ SOLN: 0.2500 mg | INTRAMUSCULAR | Status: DC | PRN

## 2012-08-26 MED ORDER — EPINEPHRINE HCL (NASAL) 0.1 % NA SOLN
NASAL | Status: DC | PRN
  Administered 2012-08-26: 1 [drp] via NASAL

## 2012-08-26 MED ORDER — ONDANSETRON HCL 4 MG/2ML IJ SOLN
INTRAMUSCULAR | Status: DC | PRN
  Administered 2012-08-26: 4 mg via INTRAVENOUS

## 2012-08-26 MED ORDER — PROMETHAZINE HCL 25 MG/ML IJ SOLN: 6.2500 mg | INTRAMUSCULAR | Status: DC | PRN

## 2012-08-26 MED ORDER — OXYCODONE HCL 5 MG PO TABS: 5.0000 mg | ORAL_TABLET | Freq: Once | ORAL | Status: DC | PRN

## 2012-08-26 MED ORDER — FENTANYL CITRATE 0.05 MG/ML IJ SOLN
INTRAMUSCULAR | Status: DC | PRN
  Administered 2012-08-26: 100 ug via INTRAVENOUS

## 2012-08-26 MED ORDER — LACTATED RINGERS IV SOLN
INTRAVENOUS | Status: DC | PRN
  Administered 2012-08-26: 07:00:00 via INTRAVENOUS

## 2012-08-26 MED ORDER — 0.9 % SODIUM CHLORIDE (POUR BTL) OPTIME
TOPICAL | Status: DC | PRN
  Administered 2012-08-26: 1000 mL

## 2012-08-26 MED ORDER — EPINEPHRINE HCL (NASAL) 0.1 % NA SOLN
NASAL | Status: AC
  Filled 2012-08-26: qty 30

## 2012-08-26 SURGICAL SUPPLY — 36 items
BALLN PULM 15 16.5 18X75 (BALLOONS)
BALLOON PULM 15 16.5 18X75 (BALLOONS) IMPLANT
CANISTER SUCTION 2500CC (MISCELLANEOUS) ×2 IMPLANT
CLOTH BEACON ORANGE TIMEOUT ST (SAFETY) ×2 IMPLANT
CONT SPEC 4OZ CLIKSEAL STRL BL (MISCELLANEOUS) IMPLANT
COVER MAYO STAND STRL (DRAPES) ×2 IMPLANT
COVER TABLE BACK 60X90 (DRAPES) ×2 IMPLANT
CRADLE DONUT ADULT HEAD (MISCELLANEOUS) ×2 IMPLANT
DEPRESSOR TONGUE BLADE STERILE (MISCELLANEOUS) IMPLANT
DRAPE PROXIMA HALF (DRAPES) ×2 IMPLANT
GAUZE SPONGE 4X4 16PLY XRAY LF (GAUZE/BANDAGES/DRESSINGS) ×2 IMPLANT
GLOVE BIO SURGEON STRL SZ 6.5 (GLOVE) ×2 IMPLANT
GLOVE BIO SURGEON STRL SZ7.5 (GLOVE) ×2 IMPLANT
GLOVE BIOGEL PI IND STRL 7.0 (GLOVE) ×1 IMPLANT
GLOVE BIOGEL PI INDICATOR 7.0 (GLOVE) ×1
GLOVE ECLIPSE 7.0 STRL STRAW (GLOVE) ×2 IMPLANT
GOWN STRL NON-REIN LRG LVL3 (GOWN DISPOSABLE) ×2 IMPLANT
GUARD TEETH (MISCELLANEOUS) IMPLANT
KIT BASIN OR (CUSTOM PROCEDURE TRAY) ×2 IMPLANT
KIT PROLARN PLUS GEL W/NDL (Prosthesis and Implant ENT) ×2 IMPLANT
KIT ROOM TURNOVER OR (KITS) ×2 IMPLANT
MARKER SKIN DUAL TIP RULER LAB (MISCELLANEOUS) IMPLANT
NEEDLE TRANS ORAL INJECTION (NEEDLE) ×2 IMPLANT
NS IRRIG 1000ML POUR BTL (IV SOLUTION) ×2 IMPLANT
PAD ARMBOARD 7.5X6 YLW CONV (MISCELLANEOUS) ×4 IMPLANT
PAD EYE OVAL STERILE LF (GAUZE/BANDAGES/DRESSINGS) IMPLANT
PATTIES SURGICAL .5 X1 (DISPOSABLE) IMPLANT
PATTIES SURGICAL .5 X3 (DISPOSABLE) ×2 IMPLANT
SOLUTION ANTI FOG 6CC (MISCELLANEOUS) ×2 IMPLANT
SPONGE GAUZE 4X4 12PLY (GAUZE/BANDAGES/DRESSINGS) IMPLANT
STAPLER TA60 3.5 010317 (STAPLE) IMPLANT
SUT SILK 2 0 SH (SUTURE) IMPLANT
SYR INFLATE BILIARY GAUGE (MISCELLANEOUS) IMPLANT
TOWEL OR 17X24 6PK STRL BLUE (TOWEL DISPOSABLE) ×2 IMPLANT
TUBE CONNECTING 12X1/4 (SUCTIONS) ×2 IMPLANT
WATER STERILE IRR 1000ML POUR (IV SOLUTION) IMPLANT

## 2012-08-26 NOTE — Anesthesia Postprocedure Evaluation (Signed)
Anesthesia Post Note  Patient: Henry Alvarez  Procedure(s) Performed: Procedure(s) (LRB): DIRECT LARYNGOSCOPY WITH RADIESSE INJECTIONS  (N/A)  Anesthesia type: general  Patient location: PACU  Post pain: Pain level controlled  Post assessment: Patient's Cardiovascular Status Stable  Last Vitals:  Filed Vitals:   08/26/12 0945  BP: 177/77  Pulse: 48  Temp: 36.2 C  Resp: 13    Post vital signs: Reviewed and stable  Level of consciousness: sedated  Complications: No apparent anesthesia complications

## 2012-08-26 NOTE — Anesthesia Preprocedure Evaluation (Addendum)
Anesthesia Evaluation  Patient identified by MRN, date of birth, ID band Patient awake    Reviewed: Allergy & Precautions, H&P , NPO status , Patient's Chart, lab work & pertinent test results, reviewed documented beta blocker date and time   History of Anesthesia Complications (+) PONV  Airway Mallampati: III TM Distance: >3 FB Neck ROM: Full    Dental  (+) Teeth Intact, Caps and Dental Advisory Given   Pulmonary asthma ,    Pulmonary exam normal       Cardiovascular hypertension, Pt. on medications and Pt. on home beta blockers + CAD and + Cardiac Stents + dysrhythmias Atrial Fibrillation  Nuclear stress test on 07/20/09 showed normal myocardial perfusion, EF 65%.  Overall LV function is normal without regional wall motion abnormalities.    Neuro/Psych PSYCHIATRIC DISORDERS TIA   GI/Hepatic   Endo/Other    Renal/GU      Musculoskeletal   Abdominal   Peds  Hematology   Anesthesia Other Findings   Reproductive/Obstetrics                         Anesthesia Physical Anesthesia Plan  ASA: III  Anesthesia Plan: General   Post-op Pain Management:    Induction: Intravenous  Airway Management Planned: Oral ETT  Additional Equipment:   Intra-op Plan:   Post-operative Plan: Extubation in OR  Informed Consent: I have reviewed the patients History and Physical, chart, labs and discussed the procedure including the risks, benefits and alternatives for the proposed anesthesia with the patient or authorized representative who has indicated his/her understanding and acceptance.   Dental advisory given  Plan Discussed with: CRNA, Anesthesiologist and Surgeon  Anesthesia Plan Comments:         Anesthesia Quick Evaluation

## 2012-08-26 NOTE — Transfer of Care (Signed)
Immediate Anesthesia Transfer of Care Note  Patient: Henry Alvarez  Procedure(s) Performed: Procedure(s) with comments: DIRECT LARYNGOSCOPY WITH RADIESSE INJECTIONS  (N/A) - direct laryngoscopy with radiesse injections  Patient Location: PACU  Anesthesia Type:General  Level of Consciousness: awake and alert   Airway & Oxygen Therapy: Patient Spontanous Breathing and Patient connected to face mask oxygen  Post-op Assessment: Report given to PACU RN, Post -op Vital signs reviewed and stable and Patient moving all extremities  Post vital signs: Reviewed and stable  Complications: No apparent anesthesia complications

## 2012-08-26 NOTE — Brief Op Note (Signed)
08/26/2012  8:18 AM  PATIENT:  Henry Alvarez  79 y.o. male  PRE-OPERATIVE DIAGNOSIS:  hoarseness  POST-OPERATIVE DIAGNOSIS:  hoarseness  PROCEDURE:  Procedure(s) with comments: DIRECT LARYNGOSCOPY WITH RADIESSE INJECTIONS  (N/A) - direct laryngoscopy with radiesse injections  SURGEON:  Surgeon(s) and Role:    * Christia Reading, MD - Primary  PHYSICIAN ASSISTANT:   ASSISTANTS: none   ANESTHESIA:   general  EBL:     BLOOD ADMINISTERED:none  DRAINS: none   LOCAL MEDICATIONS USED:  NONE  SPECIMEN:  No Specimen  DISPOSITION OF SPECIMEN:  N/A  COUNTS:  YES  TOURNIQUET:  * No tourniquets in log *  DICTATION: .Other Dictation: Dictation Number [id number]  PLAN OF CARE: Discharge to home after PACU  PATIENT DISPOSITION:  PACU - hemodynamically stable.   Delay start of Pharmacological VTE agent (>24hrs) due to surgical blood loss or risk of bleeding: no
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
## 2012-08-27 ENCOUNTER — Encounter (HOSPITAL_COMMUNITY): Payer: Self-pay | Admitting: Otolaryngology

## 2012-08-27 NOTE — Op Note (Signed)
NAME:  Henry Alvarez, CABINESS NO.:  [id number]  MEDICAL RECORD NO.:  [id number]  LOCATION:  [facility]                         FACILITY:  [facility]  PHYSICIAN:  Antony Contras, MD     DATE OF BIRTH:  April 17, 1924  DATE OF PROCEDURE:  08/26/2012 DATE OF DISCHARGE:  08/26/2012                              OPERATIVE REPORT   PREOPERATIVE DIAGNOSES:  Hoarseness and presbylaryngis.  POSTOPERATIVE DIAGNOSES:  Hoarseness and presbylaryngis.  PROCEDURE:  Suspended microdirect laryngoscopy with bilateral Radiesse injections to the vocal folds.  SURGEON:  Antony Contras, MD  ANESTHESIA:  General jet Venturi ventilation.  COMPLICATIONS:  None.  INDICATION:  The patient is an 79 year old male who with a several year history of worsening hoarseness that is worse with more talking and is associated with coughing on thin liquids.  He was found to have both vocal folds in the office, and presents to the operating room for surgical management.  FINDINGS:  The vocal folds are without mass or lesion, but are bowed and thin in appearance.  A 0.4 mL of radius was placed in the right vocal fold and 0.3 mL was placed in the left vocal fold.  DESCRIPTION OF PROCEDURE:  The patient was identified in the holding room and informed consent having been obtained including discussion of risks, benefits, and alternatives, the patient was brought to the operative suite, and put on the operating table in supine position. Anesthesia was induced and the patient was maintained via mask ventilation.  The patient was given intravenous steroids during the case and the eyes taped closed.  The bed was turned to 90 degrees from anesthesia and a tooth guard was placed.  A Storz laryngoscope was then placed into the supraglottic position and suspended to the Mayo stand using a Lewy arm.  Jet Venturi ventilation was then initiated and maintained to the case except for pausing at times.  A 0-degree telescope was used  to make a preoperative photograph.  The radius was then opened and primed in the long needle in the transoral needle and was then used to inject 0.4 mL of radius in the right vocal fold and 0.3 mL in the left.  In each side, about 2/3rd of the injection was placed posterior and about 1/3rd anteriorly.  These amounts were chosen in order to visually medialize the vocal folds in a fairly symmetric- appearing fashion.  A little bit of bleeding was encountered on the left side with injection and so an epinephrine-soaked pledget was placed and held against the vocal fold for a few seconds until bleeding stopped. The airway was suctioned and a postoperative photograph was made.  The larynx was sprayed with topical lidocaine using an LTA and the laryngoscope was then taken out of suspension and removed from the patient's mouth while suctioning the airway.  The tooth guard was removed and he was returned to mask ventilation.  He was turned back to anesthesia for wake-up and was moved to the recovery room in stable condition.     Antony Contras, MD     DDB/MEDQ  D:  08/26/2012  T:  08/27/2012  Job:  [id number]

## 2012-09-05 ENCOUNTER — Observation Stay (HOSPITAL_COMMUNITY)
Admission: EM | Admit: 2012-09-05 | Discharge: 2012-09-06 | Disposition: A | Discharge status: Home / Self Care | Payer: Medicare Other | Attending: Cardiology | Admitting: Cardiology

## 2012-09-05 ENCOUNTER — Encounter (HOSPITAL_COMMUNITY): Payer: Self-pay | Admitting: Emergency Medicine

## 2012-09-05 ENCOUNTER — Other Ambulatory Visit: Payer: Self-pay

## 2012-09-05 ENCOUNTER — Emergency Department (HOSPITAL_COMMUNITY): Payer: Medicare Other

## 2012-09-05 DIAGNOSIS — I129 Hypertensive chronic kidney disease with stage 1 through stage 4 chronic kidney disease, or unspecified chronic kidney disease: Secondary | ICD-10-CM | POA: Insufficient documentation

## 2012-09-05 DIAGNOSIS — I495 Sick sinus syndrome: Principal | ICD-10-CM

## 2012-09-05 DIAGNOSIS — N189 Chronic kidney disease, unspecified: Secondary | ICD-10-CM | POA: Diagnosis not present

## 2012-09-05 DIAGNOSIS — I480 Paroxysmal atrial fibrillation: Secondary | ICD-10-CM | POA: Diagnosis present

## 2012-09-05 DIAGNOSIS — I10 Essential (primary) hypertension: Secondary | ICD-10-CM

## 2012-09-05 DIAGNOSIS — R001 Bradycardia, unspecified: Secondary | ICD-10-CM

## 2012-09-05 DIAGNOSIS — E785 Hyperlipidemia, unspecified: Secondary | ICD-10-CM

## 2012-09-05 DIAGNOSIS — I209 Angina pectoris, unspecified: Secondary | ICD-10-CM | POA: Diagnosis not present

## 2012-09-05 DIAGNOSIS — I2510 Atherosclerotic heart disease of native coronary artery without angina pectoris: Secondary | ICD-10-CM | POA: Diagnosis not present

## 2012-09-05 DIAGNOSIS — Z79899 Other long term (current) drug therapy: Secondary | ICD-10-CM | POA: Diagnosis not present

## 2012-09-05 DIAGNOSIS — I498 Other specified cardiac arrhythmias: Secondary | ICD-10-CM | POA: Diagnosis not present

## 2012-09-05 DIAGNOSIS — I4891 Unspecified atrial fibrillation: Secondary | ICD-10-CM

## 2012-09-05 DIAGNOSIS — R002 Palpitations: Secondary | ICD-10-CM | POA: Diagnosis not present

## 2012-09-05 DIAGNOSIS — Z7901 Long term (current) use of anticoagulants: Secondary | ICD-10-CM | POA: Insufficient documentation

## 2012-09-05 DIAGNOSIS — R079 Chest pain, unspecified: Secondary | ICD-10-CM | POA: Diagnosis not present

## 2012-09-05 LAB — BASIC METABOLIC PANEL
BUN: 19 mg/dL (ref 6–23)
Calcium: 9.4 mg/dL (ref 8.4–10.5)
Creatinine, Ser: 0.93 mg/dL (ref 0.50–1.35)
GFR calc Af Amer: 86 mL/min — ABNORMAL LOW (ref >90)
GFR calc non Af Amer: 74 mL/min — ABNORMAL LOW (ref >90)

## 2012-09-05 LAB — CBC WITH DIFFERENTIAL/PLATELET
Basophils Absolute: 0 10*3/uL (ref 0.0–0.1)
Basophils Relative: 1 % (ref 0–1)
Eosinophils Absolute: 0 10*3/uL (ref 0.0–0.7)
HCT: 39.3 % (ref 39.0–52.0)
Hemoglobin: 13.3 g/dL (ref 13.0–17.0)
MCH: 31.7 pg (ref 26.0–34.0)
MCHC: 33.8 g/dL (ref 30.0–36.0)
Monocytes Absolute: 0.6 10*3/uL (ref 0.1–1.0)
Monocytes Relative: 13 % — ABNORMAL HIGH (ref 3–12)
Neutro Abs: 3.5 10*3/uL (ref 1.7–7.7)
RDW: 13.8 % (ref 11.5–15.5)

## 2012-09-05 LAB — TROPONIN I
Troponin I: <0.3 ng/mL (ref <0.30)
Troponin I: <0.3 ng/mL (ref <0.30)

## 2012-09-05 LAB — URINALYSIS, ROUTINE W REFLEX MICROSCOPIC
Bilirubin Urine: NEGATIVE
Hgb urine dipstick: NEGATIVE
Ketones, ur: NEGATIVE mg/dL
Nitrite: NEGATIVE
Protein, ur: NEGATIVE mg/dL
Urobilinogen, UA: 0.2 mg/dL (ref 0.0–1.0)

## 2012-09-05 MED ORDER — SODIUM CHLORIDE 0.9 % IV SOLN: 250.0000 mL | INTRAVENOUS | Status: DC | PRN

## 2012-09-05 MED ORDER — MELATONIN 3 MG PO TABS: 3.0000 mg | ORAL_TABLET | Freq: Every evening | ORAL | Status: DC | PRN

## 2012-09-05 MED ORDER — TRAMADOL-ACETAMINOPHEN 37.5-325 MG PO TABS: 1.0000 | ORAL_TABLET | Freq: Four times a day (QID) | ORAL | Status: DC | PRN

## 2012-09-05 MED ORDER — CELECOXIB 200 MG PO CAPS
200.0000 mg | ORAL_CAPSULE | Freq: Every day | ORAL | Status: DC | PRN
  Filled 2012-09-05: qty 1

## 2012-09-05 MED ORDER — ONDANSETRON HCL 4 MG/2ML IJ SOLN: 4.0000 mg | Freq: Four times a day (QID) | INTRAMUSCULAR | Status: DC | PRN

## 2012-09-05 MED ORDER — SODIUM CHLORIDE 0.9 % IJ SOLN: 3.0000 mL | INTRAMUSCULAR | Status: DC | PRN

## 2012-09-05 MED ORDER — ACETAMINOPHEN 325 MG PO TABS: 650.0000 mg | ORAL_TABLET | ORAL | Status: DC | PRN

## 2012-09-05 MED ORDER — ZOLPIDEM TARTRATE 5 MG PO TABS: 5.0000 mg | ORAL_TABLET | Freq: Every evening | ORAL | Status: DC | PRN

## 2012-09-05 MED ORDER — CARBIDOPA-LEVODOPA 25-100 MG PO TABS
1.0000 | ORAL_TABLET | Freq: Three times a day (TID) | ORAL | Status: DC
  Administered 2012-09-05 – 2012-09-06 (×2): 1 via ORAL
  Filled 2012-09-05 (×4): qty 1

## 2012-09-05 MED ORDER — METOPROLOL TARTRATE 50 MG PO TABS
50.0000 mg | ORAL_TABLET | Freq: Every day | ORAL | Status: DC | PRN
  Filled 2012-09-05: qty 1

## 2012-09-05 MED ORDER — ADULT MULTIVITAMIN W/MINERALS CH
1.0000 | ORAL_TABLET | Freq: Every day | ORAL | Status: DC
  Filled 2012-09-05 (×2): qty 1

## 2012-09-05 MED ORDER — METOPROLOL TARTRATE 25 MG PO TABS
25.0000 mg | ORAL_TABLET | Freq: Once | ORAL | Status: AC
  Administered 2012-09-05: 25 mg via ORAL
  Filled 2012-09-05: qty 1

## 2012-09-05 MED ORDER — ALPRAZOLAM 0.25 MG PO TABS: 0.2500 mg | ORAL_TABLET | Freq: Two times a day (BID) | ORAL | Status: DC | PRN

## 2012-09-05 MED ORDER — NITROGLYCERIN 0.4 MG SL SUBL: 0.4000 mg | SUBLINGUAL_TABLET | SUBLINGUAL | Status: DC | PRN

## 2012-09-05 MED ORDER — SODIUM CHLORIDE 0.9 % IJ SOLN
3.0000 mL | Freq: Two times a day (BID) | INTRAMUSCULAR | Status: DC
  Administered 2012-09-05: 3 mL via INTRAVENOUS

## 2012-09-05 MED ORDER — SODIUM CHLORIDE 0.9 % IV SOLN
INTRAVENOUS | Status: DC
  Administered 2012-09-05: 14:00:00 via INTRAVENOUS

## 2012-09-05 MED ORDER — HEPARIN (PORCINE) IN NACL 100-0.45 UNIT/ML-% IJ SOLN
1000.0000 [IU]/h | INTRAMUSCULAR | Status: DC
  Administered 2012-09-05: 900 [IU]/h via INTRAVENOUS
  Administered 2012-09-06: 1000 [IU]/h via INTRAVENOUS
  Filled 2012-09-05 (×2): qty 250

## 2012-09-05 MED ORDER — TAMSULOSIN HCL 0.4 MG PO CAPS
0.4000 mg | ORAL_CAPSULE | Freq: Every day | ORAL | Status: DC
  Administered 2012-09-05: 0.4 mg via ORAL
  Filled 2012-09-05 (×2): qty 1

## 2012-09-05 MED ORDER — SODIUM CHLORIDE 0.9 % IV BOLUS (SEPSIS)
500.0000 mL | Freq: Once | INTRAVENOUS | Status: AC
  Administered 2012-09-05: 500 mL via INTRAVENOUS

## 2012-09-05 MED ORDER — AMLODIPINE BESYLATE 2.5 MG PO TABS
2.5000 mg | ORAL_TABLET | Freq: Every day | ORAL | Status: DC
  Administered 2012-09-05 – 2012-09-06 (×2): 2.5 mg via ORAL
  Filled 2012-09-05 (×2): qty 1

## 2012-09-05 MED ORDER — IPRATROPIUM BROMIDE 0.06 % NA SOLN
2.0000 | Freq: Every day | NASAL | Status: DC | PRN
  Filled 2012-09-05: qty 15

## 2012-09-05 MED ORDER — HEPARIN BOLUS VIA INFUSION
2000.0000 [IU] | Freq: Once | INTRAVENOUS | Status: AC
  Administered 2012-09-05: 2000 [IU] via INTRAVENOUS
  Filled 2012-09-05: qty 2000

## 2012-09-05 NOTE — ED Notes (Signed)
Cardiology MD at bedside.
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
## 2012-09-05 NOTE — ED Notes (Addendum)
Cardiology PA at bedside. 

## 2012-09-05 NOTE — ED Notes (Signed)
Per EMS pt here with sudden onset non radiating chest pressure 4/10 and SOB. Pt 156 HR in afib on monitor. Pt has hx of afib and takes medication as needed, states this did not feel the same. Pt was given 10mg  cardizem and HR decreased to 90's.
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
## 2012-09-05 NOTE — ED Notes (Signed)
Meal tray ordered 

## 2012-09-05 NOTE — Progress Notes (Signed)

## 2012-09-06 ENCOUNTER — Other Ambulatory Visit: Payer: Self-pay

## 2012-09-06 ENCOUNTER — Telehealth: Payer: Self-pay

## 2012-09-06 DIAGNOSIS — I4891 Unspecified atrial fibrillation: Secondary | ICD-10-CM | POA: Diagnosis not present

## 2012-09-06 DIAGNOSIS — I2510 Atherosclerotic heart disease of native coronary artery without angina pectoris: Secondary | ICD-10-CM

## 2012-09-06 DIAGNOSIS — N189 Chronic kidney disease, unspecified: Secondary | ICD-10-CM | POA: Diagnosis not present

## 2012-09-06 DIAGNOSIS — I129 Hypertensive chronic kidney disease with stage 1 through stage 4 chronic kidney disease, or unspecified chronic kidney disease: Secondary | ICD-10-CM | POA: Diagnosis not present

## 2012-09-06 DIAGNOSIS — I495 Sick sinus syndrome: Secondary | ICD-10-CM | POA: Diagnosis not present

## 2012-09-06 LAB — CBC
HCT: 31.4 % — ABNORMAL LOW (ref 39.0–52.0)
Hemoglobin: 10.5 g/dL — ABNORMAL LOW (ref 13.0–17.0)
MCHC: 33.4 g/dL (ref 30.0–36.0)
MCV: 94.3 fL (ref 78.0–100.0)
RDW: 14 % (ref 11.5–15.5)

## 2012-09-06 LAB — PROTIME-INR: Prothrombin Time: 21.8 seconds — ABNORMAL HIGH (ref 11.6–15.2)

## 2012-09-06 LAB — COMPREHENSIVE METABOLIC PANEL
ALT: <5 U/L (ref 0–53)
BUN: 21 mg/dL (ref 6–23)
Creatinine, Ser: 1.2 mg/dL (ref 0.50–1.35)
GFR calc non Af Amer: 53 mL/min — ABNORMAL LOW (ref >90)
Glucose, Bld: 88 mg/dL (ref 70–99)
Potassium: 4.4 mEq/L (ref 3.5–5.1)
Total Protein: 5.5 g/dL — ABNORMAL LOW (ref 6.0–8.3)

## 2012-09-06 LAB — LIPID PANEL
HDL: 65 mg/dL (ref >39)
LDL Cholesterol: 108 mg/dL — ABNORMAL HIGH (ref 0–99)
Total CHOL/HDL Ratio: 2.8 RATIO
VLDL: 7 mg/dL (ref 0–40)

## 2012-09-06 LAB — HEPARIN LEVEL (UNFRACTIONATED): Heparin Unfractionated: 0.26 IU/mL — ABNORMAL LOW (ref 0.30–0.70)

## 2012-09-06 LAB — TROPONIN I
Troponin I: <0.3 ng/mL (ref <0.30)
Troponin I: <0.3 ng/mL (ref <0.30)

## 2012-09-06 NOTE — Progress Notes (Signed)
ANTICOAGULATION CONSULT NOTE - Follow Up Consult  Pharmacy Consult for heparin Indication: atrial fibrillation  Labs:  Recent Labs  09/05/12 1300 09/05/12 1604 09/05/12 1810 09/06/12 0008 09/06/12 0608  HGB 13.3  --   --   --   --   HCT 39.3  --   --   --   --   PLT 109*  --   --   --   --   LABPROT  --  19.9*  --   --  21.8*  INR  --  1.75*  --   --  1.97*  HEPARINUNFRC  --   --   --   --  0.26*  CREATININE 0.93  --   --   --   --   TROPONINI <0.30  --  <0.30 <0.30  --     Assessment: 79yo male slightly subtherapeutic on heparin with initial dosing for Afib.  Goal of Therapy:  Heparin level 0.3-0.7 units/ml   Plan:  Will increase heparin gtt by ~1 unit/kg/hr to 1000 units/hr and check level in 8hr.  Vernard Gambles, PharmD, BCPS  09/06/2012,7:03 AM

## 2012-09-06 NOTE — Discharge Summary (Signed)
CARDIOLOGY DISCHARGE SUMMARY   Patient ID: Henry Alvarez MRN: [id number] DOB/AGE: 1924-06-10 79 y.o.  Admit date: 09/05/2012 Discharge date: 09/06/2012  Primary Discharge Diagnosis:   Tachy-brady syndrome Secondary Discharge Diagnosis:    Hypertension   Paroxysmal atrial fibrillation   Long-term (current) use of anticoagulants   Angina pectoris  Hospital Course: Henry Alvarez is a 79 y.o. male with a history of CAD and PAF. He had tachycardia associated with palpitations. When his symptoms did not resolve, he came to the emergency room by EMS. EMS gave him Cardizem 10 mg IV. He was then given Lopressor 25 mg. He spontaneously converted to sinus rhythm, but his heart rate was in the 30s. He was admitted for further evaluation and treatment.  In the setting of a heart rate greater than 150, he had some chest pain but his cardiac enzymes were negative. His Coumadin level was monitored by pharmacy while in the hospital. It was subtherapeutic on admission but the patient had recently held his Coumadin for procedure. He was placed on heparin while he was in the hospital and his Coumadin was continued.   Despite his low heart rate, his blood pressure remained elevated so he was continued on his home blood pressure medications. Overnight, he had some episodic atrial fibrillation but would spontaneously convert to sinus rhythm. In sinus rhythm, his heart rate was generally in the 40s. Henry Alvarez admitted that he was slightly symptomatic with a low heart rate but reluctant to consider a pacemaker.  An EP consult was called to further evaluate him and discussed the possible need for pacemaker. He was seen by Dr. Johney Frame on 09/06/2012. All data were reviewed with the patient and a pacemaker was recommended. Because of his significant bradycardia, rate control options for atrial fibrillation are limited. Dr. Johney Frame did not recommend any medication changes or any further inpatient workup. Dr. Johney Frame  recommended continuing as needed metoprolol and a followup with Dr. Sw[place]and. Henry Alvarez is considered stable for discharge, to follow up as an outpatient.  Labs:   Lab Results  Component Value Date   WBC 5.2 09/06/2012   HGB 10.5* 09/06/2012   HCT 31.4* 09/06/2012   MCV 94.3 09/06/2012   PLT 97* 09/06/2012     Recent Labs Lab 09/06/12 0608  NA 142  K 4.4  CL 111  CO2 24  BUN 21  CREATININE 1.20  CALCIUM 8.7  PROT 5.5*  BILITOT 0.4  ALKPHOS 42  ALT <5  AST 18  GLUCOSE 88    Recent Labs  09/05/12 1810 09/06/12 0008 09/06/12 0608  TROPONINI <0.30 <0.30 <0.30    Recent Labs  09/06/12 0608  INR 1.97*      Radiology:  Dg Chest Port 1 View 09/05/2012   *RADIOLOGY REPORT*  Clinical Data: Palpitations.  PORTABLE CHEST - 1 VIEW  Comparison: Chest x-ray 08/23/2012.  Findings: Skin fold artifact projecting over the right mid to lower hemithorax.  No definite pneumothorax appreciated at this time.  No acute consolidative airspace disease.  No pleural effusions.  No evidence of pulmonary edema.  Heart size is borderline enlarged. Atherosclerosis and tortuosity of the thoracic aorta. The patient is rotated to the right on today's exam, resulting in distortion of the mediastinal contours and reduced diagnostic sensitivity and specificity for mediastinal pathology.  Post procedural changes of vertebroplasty are noted in the lower thoracic and upper lumbar spine.  IMPRESSION: 1.  No radiographic evidence of acute cardiopulmonary disease. 2.  Atherosclerosis.  Original Report Authenticated By: Trudie Reed, M.D.   EKG: 09/06/2012 Vent. rate 45 BPM PR interval 158 ms QRS duration 94 ms QT/QTc 472/408 ms P-R-T axes 66 -22 257   FOLLOW UP PLANS AND APPOINTMENTS No Known Allergies   Medication List         amLODipine 2.5 MG tablet  Commonly known as:  NORVASC  Take 2.5 mg by mouth every morning.     carbidopa-levodopa 25-100 MG per tablet  Commonly known as:  SINEMET IR  Take  0.5-1 tablets by mouth See admin instructions. Take 0.5 tablets twice daily for 3 days, then increase to 3 times daily for 4 days, then increase to 1 whole tablet 3 times daily thereafter.     celecoxib 200 MG capsule  Commonly known as:  CELEBREX  Take 200 mg by mouth daily as needed for pain.     FLOMAX 0.4 MG Caps  Generic drug:  tamsulosin  Take 0.4 mg by mouth daily.     ipratropium 0.06 % nasal spray  Commonly known as:  ATROVENT  Place 2 sprays into the nose daily as needed. For runny nose     Melatonin 3 MG Tabs  Take 3 mg by mouth at bedtime as needed (for sleep).     metoprolol 50 MG tablet  Commonly known as:  LOPRESSOR  Take 50 mg by mouth daily as needed (for tachycardia).     multivitamin with minerals Tabs  Take 1 tablet by mouth daily.     traMADol-acetaminophen 37.5-325 MG per tablet  Commonly known as:  ULTRACET  Take 1 tablet by mouth every 6 (six) hours as needed for pain.     warfarin 3 MG tablet  Commonly known as:  COUMADIN  Take 9 mg by mouth daily.        Discharge Orders   Future Appointments Provider Department Dept Phone   11/13/2012 2:00 PM Peter M Swaz[place], MD [facility] Main [facility] [place]) [phone]   11/25/2012 3:00 PM Cira Servant, DO [facility] [place] [phone]   Future Orders Complete By Expires     Diet - low sodium heart healthy  As directed     Increase activity slowly  As directed       Follow-up Information   Follow up with Peter Swaziland, MD On 11/13/2012. (at 2:00 pm)    Contact information:   [address] [place] [address] [phone]       BRING ALL MEDICATIONS WITH YOU TO FOLLOW UP APPOINTMENTS  Time spent with patient to include physician time: 34 min Signed: Theodore Demark, PA-C 09/06/2012, 9:18 AM Co-Sign MD  Hillis Range, MD

## 2012-09-06 NOTE — Progress Notes (Signed)
Pt discharged per MD order. Pt and son verbalized understanding of discharge teaching. Pt was alert and oriented with no complaints of pain at discharge. Pt was discharged with son and guest services volunteer transported him in a wheelchair to private vehicle. Ilean Skill, Aradhya Shellenbarger R, RN

## 2012-09-06 NOTE — Telephone Encounter (Signed)
Received a message patient has appointment with Dr.Jordan 11/13/12.Dr.Jordan advised ok to wait until 11/13/12.Patient called no answer.Left message on personal voice mail ok to wait until 11/13/12 appointment with Dr.Jordan.Advised to call sooner if needed.
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
## 2012-09-30 ENCOUNTER — Other Ambulatory Visit: Payer: Self-pay | Admitting: Neurology

## 2012-09-30 MED ORDER — CARBIDOPA-LEVODOPA 25-100 MG PO TABS: 1.0000 | ORAL_TABLET | Freq: Three times a day (TID) | ORAL | Status: DC

## 2012-11-11 DIAGNOSIS — R490 Dysphonia: Secondary | ICD-10-CM | POA: Diagnosis not present

## 2012-11-11 DIAGNOSIS — D696 Thrombocytopenia, unspecified: Secondary | ICD-10-CM | POA: Diagnosis not present

## 2012-11-11 DIAGNOSIS — M545 Low back pain: Secondary | ICD-10-CM | POA: Diagnosis not present

## 2012-11-11 DIAGNOSIS — Z7901 Long term (current) use of anticoagulants: Secondary | ICD-10-CM | POA: Diagnosis not present

## 2012-11-11 DIAGNOSIS — I4891 Unspecified atrial fibrillation: Secondary | ICD-10-CM | POA: Diagnosis not present

## 2012-11-11 DIAGNOSIS — R634 Abnormal weight loss: Secondary | ICD-10-CM | POA: Diagnosis not present

## 2012-11-11 DIAGNOSIS — Z23 Encounter for immunization: Secondary | ICD-10-CM | POA: Diagnosis not present

## 2012-11-11 DIAGNOSIS — G20 Parkinson's disease: Secondary | ICD-10-CM | POA: Diagnosis not present

## 2012-11-13 ENCOUNTER — Ambulatory Visit (INDEPENDENT_AMBULATORY_CARE_PROVIDER_SITE_OTHER): Payer: Medicare Other | Admitting: Cardiology

## 2012-11-13 ENCOUNTER — Encounter: Payer: Self-pay | Admitting: Cardiology

## 2012-11-13 VITALS — BP 147/66 | HR 55 | Ht 67.0 in | Wt 150.0 lb

## 2012-11-13 DIAGNOSIS — Z7901 Long term (current) use of anticoagulants: Secondary | ICD-10-CM

## 2012-11-13 DIAGNOSIS — I4891 Unspecified atrial fibrillation: Secondary | ICD-10-CM

## 2012-11-13 DIAGNOSIS — I2510 Atherosclerotic heart disease of native coronary artery without angina pectoris: Secondary | ICD-10-CM

## 2012-11-13 DIAGNOSIS — I10 Essential (primary) hypertension: Secondary | ICD-10-CM

## 2012-11-13 DIAGNOSIS — I480 Paroxysmal atrial fibrillation: Secondary | ICD-10-CM

## 2012-11-13 DIAGNOSIS — I495 Sick sinus syndrome: Secondary | ICD-10-CM

## 2012-11-13 NOTE — Patient Instructions (Signed)
Continue your current therapy  Call if you have recurrent problems  I will see you in 6 months.
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
## 2012-11-25 ENCOUNTER — Encounter: Payer: Self-pay | Admitting: Neurology

## 2012-11-25 ENCOUNTER — Ambulatory Visit (INDEPENDENT_AMBULATORY_CARE_PROVIDER_SITE_OTHER): Payer: Medicare Other | Admitting: Neurology

## 2012-11-25 VITALS — BP 166/78 | HR 48 | Temp 97.5°F | Ht 67.2 in | Wt 151.0 lb

## 2012-11-25 DIAGNOSIS — R269 Unspecified abnormalities of gait and mobility: Secondary | ICD-10-CM | POA: Diagnosis not present

## 2012-11-25 DIAGNOSIS — G20 Parkinson's disease: Secondary | ICD-10-CM

## 2012-11-25 DIAGNOSIS — G20A1 Parkinson's disease without dyskinesia, without mention of fluctuations: Secondary | ICD-10-CM

## 2012-11-25 MED ORDER — CARBIDOPA-LEVODOPA 25-100 MG PO TABS: 1.5000 | ORAL_TABLET | Freq: Three times a day (TID) | ORAL | Status: DC

## 2012-11-25 NOTE — Patient Instructions (Addendum)
1.  Increase Sinemet to one and a half tablets three times daily (11am, 6pm and 11pm). 2.  Neuro rehab for walking.  They will call you to schedule the appointment. 3.  Follow up in 4 months.   We will refer [facility] to the [facility] located at [address] in [place] for your walking. They will call you to make the appointment.   [phone].
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
## 2012-12-12 ENCOUNTER — Telehealth: Payer: Self-pay

## 2012-12-12 DIAGNOSIS — Z7901 Long term (current) use of anticoagulants: Secondary | ICD-10-CM | POA: Diagnosis not present

## 2012-12-12 DIAGNOSIS — I4891 Unspecified atrial fibrillation: Secondary | ICD-10-CM | POA: Diagnosis not present

## 2012-12-12 NOTE — Telephone Encounter (Signed)
Pt should really be getting this from his PCP - that way his PCP can know ALL the medications he is on and look for interactions. Also, sometimes it is often safer to have older people on low doses of hydrocodone rather than tramadol which could have more neurologic side effects - especially as he is on neurologic medications for his parkinson's disease.  If they want a refill of just 10 pills to tide him over until he can touch base w/ his PCP - either by phone or at his next appt - that is fine but would be safer to just let PCP refill (or change).

## 2012-12-12 NOTE — Telephone Encounter (Signed)
Pharmacy called to request Rf of Ultracet. Dr Clelia Croft, do you want to RF or need pt to RTC first? I have pended 1 RF for your review.

## 2012-12-13 NOTE — Telephone Encounter (Signed)
[facility] and advised pharmacist that it would be best to contact pt's PCP. She agreed.

## 2012-12-17 ENCOUNTER — Ambulatory Visit: Payer: Medicare Other | Attending: Neurology | Admitting: Physical Therapy

## 2012-12-17 DIAGNOSIS — R269 Unspecified abnormalities of gait and mobility: Secondary | ICD-10-CM | POA: Diagnosis not present

## 2012-12-17 DIAGNOSIS — IMO0001 Reserved for inherently not codable concepts without codable children: Secondary | ICD-10-CM | POA: Diagnosis not present

## 2012-12-24 ENCOUNTER — Ambulatory Visit: Payer: Medicare Other | Admitting: Physical Therapy

## 2012-12-30 ENCOUNTER — Ambulatory Visit: Payer: Medicare Other | Admitting: Physical Therapy

## 2013-01-01 ENCOUNTER — Ambulatory Visit: Payer: Medicare Other | Admitting: Physical Therapy

## 2013-01-07 ENCOUNTER — Ambulatory Visit: Payer: Medicare Other | Admitting: Physical Therapy

## 2013-01-08 ENCOUNTER — Ambulatory Visit: Payer: Medicare Other | Attending: Neurology | Admitting: Physical Therapy

## 2013-01-08 DIAGNOSIS — L570 Actinic keratosis: Secondary | ICD-10-CM | POA: Diagnosis not present

## 2013-01-08 DIAGNOSIS — D485 Neoplasm of uncertain behavior of skin: Secondary | ICD-10-CM | POA: Diagnosis not present

## 2013-01-08 DIAGNOSIS — R269 Unspecified abnormalities of gait and mobility: Secondary | ICD-10-CM | POA: Insufficient documentation

## 2013-01-08 DIAGNOSIS — IMO0001 Reserved for inherently not codable concepts without codable children: Secondary | ICD-10-CM | POA: Insufficient documentation

## 2013-01-09 ENCOUNTER — Ambulatory Visit: Payer: Medicare Other | Admitting: Physical Therapy

## 2013-01-14 ENCOUNTER — Ambulatory Visit: Payer: Medicare Other | Admitting: Physical Therapy

## 2013-01-16 ENCOUNTER — Ambulatory Visit: Payer: Medicare Other | Admitting: Physical Therapy

## 2013-01-16 DIAGNOSIS — IMO0001 Reserved for inherently not codable concepts without codable children: Secondary | ICD-10-CM | POA: Diagnosis not present

## 2013-01-16 DIAGNOSIS — R269 Unspecified abnormalities of gait and mobility: Secondary | ICD-10-CM | POA: Diagnosis not present

## 2013-01-21 ENCOUNTER — Ambulatory Visit: Payer: Medicare Other | Admitting: Physical Therapy

## 2013-01-23 ENCOUNTER — Ambulatory Visit: Payer: Medicare Other | Admitting: Physical Therapy

## 2013-01-28 ENCOUNTER — Ambulatory Visit: Payer: Medicare Other | Admitting: Physical Therapy

## 2013-02-03 ENCOUNTER — Ambulatory Visit: Payer: Medicare Other | Admitting: Physical Therapy

## 2013-02-04 ENCOUNTER — Ambulatory Visit: Payer: Medicare Other | Admitting: Physical Therapy

## 2013-02-05 ENCOUNTER — Ambulatory Visit: Payer: Medicare Other | Admitting: Physical Therapy

## 2013-02-10 ENCOUNTER — Ambulatory Visit: Payer: Medicare Other | Attending: Neurology | Admitting: Physical Therapy

## 2013-02-10 DIAGNOSIS — R269 Unspecified abnormalities of gait and mobility: Secondary | ICD-10-CM | POA: Insufficient documentation

## 2013-02-10 DIAGNOSIS — IMO0001 Reserved for inherently not codable concepts without codable children: Secondary | ICD-10-CM | POA: Insufficient documentation

## 2013-02-13 ENCOUNTER — Ambulatory Visit: Payer: Medicare Other | Admitting: Physical Therapy

## 2013-02-17 ENCOUNTER — Ambulatory Visit: Payer: Medicare Other | Admitting: Physical Therapy

## 2013-02-20 ENCOUNTER — Ambulatory Visit: Payer: Medicare Other | Admitting: Physical Therapy

## 2013-02-25 ENCOUNTER — Ambulatory Visit: Payer: Medicare Other | Admitting: Physical Therapy

## 2013-02-27 ENCOUNTER — Ambulatory Visit: Payer: Medicare Other | Admitting: Occupational Therapy

## 2013-03-04 ENCOUNTER — Ambulatory Visit: Payer: Medicare Other | Admitting: Physical Therapy

## 2013-03-06 ENCOUNTER — Ambulatory Visit: Payer: Medicare Other | Admitting: Physical Therapy

## 2013-03-06 ENCOUNTER — Ambulatory Visit: Payer: Medicare Other | Admitting: Occupational Therapy

## 2013-03-10 ENCOUNTER — Ambulatory Visit: Payer: Medicare Other | Admitting: Physical Therapy

## 2013-03-10 ENCOUNTER — Ambulatory Visit: Payer: Medicare Other | Admitting: Occupational Therapy

## 2013-03-13 ENCOUNTER — Ambulatory Visit: Payer: Medicare Other | Attending: Neurology | Admitting: Physical Therapy

## 2013-03-13 ENCOUNTER — Encounter: Payer: Medicare Other | Admitting: Occupational Therapy

## 2013-03-13 ENCOUNTER — Ambulatory Visit: Payer: Medicare Other | Admitting: Occupational Therapy

## 2013-03-13 DIAGNOSIS — IMO0001 Reserved for inherently not codable concepts without codable children: Secondary | ICD-10-CM | POA: Diagnosis not present

## 2013-03-13 DIAGNOSIS — R269 Unspecified abnormalities of gait and mobility: Secondary | ICD-10-CM | POA: Insufficient documentation

## 2013-03-17 DIAGNOSIS — E785 Hyperlipidemia, unspecified: Secondary | ICD-10-CM | POA: Diagnosis not present

## 2013-03-17 DIAGNOSIS — I4891 Unspecified atrial fibrillation: Secondary | ICD-10-CM | POA: Diagnosis not present

## 2013-03-17 DIAGNOSIS — Z1331 Encounter for screening for depression: Secondary | ICD-10-CM | POA: Diagnosis not present

## 2013-03-17 DIAGNOSIS — R7401 Elevation of levels of liver transaminase levels: Secondary | ICD-10-CM | POA: Diagnosis not present

## 2013-03-17 DIAGNOSIS — I10 Essential (primary) hypertension: Secondary | ICD-10-CM | POA: Diagnosis not present

## 2013-03-17 DIAGNOSIS — I2510 Atherosclerotic heart disease of native coronary artery without angina pectoris: Secondary | ICD-10-CM | POA: Diagnosis not present

## 2013-03-17 DIAGNOSIS — M545 Low back pain, unspecified: Secondary | ICD-10-CM | POA: Diagnosis not present

## 2013-03-17 DIAGNOSIS — D696 Thrombocytopenia, unspecified: Secondary | ICD-10-CM | POA: Diagnosis not present

## 2013-03-17 DIAGNOSIS — G20 Parkinson's disease: Secondary | ICD-10-CM | POA: Diagnosis not present

## 2013-03-17 DIAGNOSIS — R7402 Elevation of levels of lactic acid dehydrogenase (LDH): Secondary | ICD-10-CM | POA: Diagnosis not present

## 2013-03-18 ENCOUNTER — Ambulatory Visit: Payer: Medicare Other | Admitting: Occupational Therapy

## 2013-03-18 ENCOUNTER — Ambulatory Visit: Payer: Medicare Other | Admitting: Physical Therapy

## 2013-03-20 ENCOUNTER — Ambulatory Visit: Payer: Medicare Other | Admitting: Physical Therapy

## 2013-03-20 ENCOUNTER — Ambulatory Visit: Payer: Medicare Other | Admitting: Occupational Therapy

## 2013-03-24 ENCOUNTER — Ambulatory Visit: Payer: Medicare Other | Admitting: Physical Therapy

## 2013-03-24 ENCOUNTER — Ambulatory Visit: Payer: Medicare Other | Admitting: Occupational Therapy

## 2013-03-26 ENCOUNTER — Ambulatory Visit: Payer: Medicare Other | Admitting: Occupational Therapy

## 2013-03-26 ENCOUNTER — Ambulatory Visit: Payer: Medicare Other | Admitting: Physical Therapy

## 2013-03-27 ENCOUNTER — Encounter: Payer: Medicare Other | Admitting: Occupational Therapy

## 2013-03-31 ENCOUNTER — Ambulatory Visit (INDEPENDENT_AMBULATORY_CARE_PROVIDER_SITE_OTHER): Payer: Medicare Other | Admitting: Neurology

## 2013-03-31 ENCOUNTER — Encounter: Payer: Self-pay | Admitting: Neurology

## 2013-03-31 ENCOUNTER — Ambulatory Visit: Payer: Medicare Other | Admitting: Occupational Therapy

## 2013-03-31 VITALS — BP 112/68 | HR 70 | Temp 97.0°F | Resp 18 | Ht 67.0 in | Wt 149.9 lb

## 2013-03-31 DIAGNOSIS — R413 Other amnesia: Secondary | ICD-10-CM

## 2013-03-31 DIAGNOSIS — G20A1 Parkinson's disease without dyskinesia, without mention of fluctuations: Secondary | ICD-10-CM

## 2013-03-31 DIAGNOSIS — G20 Parkinson's disease: Secondary | ICD-10-CM
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
## 2013-03-31 NOTE — Patient Instructions (Signed)
You seem to be doing better since last visit. 1.  Continue the Sinemet 1.5 tablets three times daily.  Take each dose at the same time everyday (11am, 6pm and 11pm).  Take your morning dose at 11am (about 30-26minutes prior to getting out of bed.  Leave your morning pills and glass of water on your night stand). 2.  Continue the occupational therapy.  Perform the exercises you learned at physical therapy daily. 3.  If you wish to start an antidepressant, please let me know. 4.  Follow up in 3 months.

## 2013-04-03 ENCOUNTER — Ambulatory Visit: Payer: Medicare Other | Admitting: Occupational Therapy

## 2013-04-11 ENCOUNTER — Ambulatory Visit: Payer: Medicare Other | Attending: Neurology | Admitting: Occupational Therapy

## 2013-04-11 DIAGNOSIS — R269 Unspecified abnormalities of gait and mobility: Secondary | ICD-10-CM | POA: Diagnosis not present

## 2013-04-11 DIAGNOSIS — IMO0001 Reserved for inherently not codable concepts without codable children: Secondary | ICD-10-CM | POA: Insufficient documentation

## 2013-04-16 ENCOUNTER — Encounter: Payer: Medicare Other | Admitting: Occupational Therapy

## 2013-04-18 ENCOUNTER — Ambulatory Visit: Payer: Medicare Other | Admitting: Occupational Therapy

## 2013-04-22 DIAGNOSIS — M25519 Pain in unspecified shoulder: Secondary | ICD-10-CM | POA: Diagnosis not present

## 2013-04-22 DIAGNOSIS — IMO0002 Reserved for concepts with insufficient information to code with codable children: Secondary | ICD-10-CM | POA: Diagnosis not present

## 2013-04-23 ENCOUNTER — Ambulatory Visit: Payer: Medicare Other | Admitting: Occupational Therapy

## 2013-04-23 DIAGNOSIS — R269 Unspecified abnormalities of gait and mobility: Secondary | ICD-10-CM | POA: Diagnosis not present

## 2013-04-23 DIAGNOSIS — IMO0001 Reserved for inherently not codable concepts without codable children: Secondary | ICD-10-CM | POA: Diagnosis not present

## 2013-04-25 ENCOUNTER — Ambulatory Visit: Payer: Medicare Other | Admitting: Occupational Therapy

## 2013-04-25 DIAGNOSIS — IMO0001 Reserved for inherently not codable concepts without codable children: Secondary | ICD-10-CM | POA: Diagnosis not present

## 2013-04-25 DIAGNOSIS — R269 Unspecified abnormalities of gait and mobility: Secondary | ICD-10-CM | POA: Diagnosis not present

## 2013-04-30 ENCOUNTER — Ambulatory Visit: Payer: Medicare Other | Admitting: Occupational Therapy

## 2013-04-30 DIAGNOSIS — R269 Unspecified abnormalities of gait and mobility: Secondary | ICD-10-CM | POA: Diagnosis not present

## 2013-04-30 DIAGNOSIS — IMO0001 Reserved for inherently not codable concepts without codable children: Secondary | ICD-10-CM | POA: Diagnosis not present

## 2013-05-02 ENCOUNTER — Ambulatory Visit: Payer: Medicare Other | Admitting: Occupational Therapy

## 2013-05-08 ENCOUNTER — Ambulatory Visit: Payer: Medicare Other | Admitting: Occupational Therapy

## 2013-05-11 ENCOUNTER — Ambulatory Visit: Payer: Medicare Other

## 2013-05-11 ENCOUNTER — Ambulatory Visit (INDEPENDENT_AMBULATORY_CARE_PROVIDER_SITE_OTHER): Payer: Medicare Other | Admitting: Emergency Medicine

## 2013-05-11 VITALS — BP 120/76 | HR 60 | Temp 97.5°F | Resp 16 | Ht 63.5 in | Wt 149.0 lb

## 2013-05-11 DIAGNOSIS — M79609 Pain in unspecified limb: Secondary | ICD-10-CM

## 2013-05-11 DIAGNOSIS — S40021A Contusion of right upper arm, initial encounter: Secondary | ICD-10-CM

## 2013-05-11 DIAGNOSIS — M79601 Pain in right arm: Secondary | ICD-10-CM

## 2013-05-11 DIAGNOSIS — S40029A Contusion of unspecified upper arm, initial encounter: Secondary | ICD-10-CM
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
## 2013-05-16 ENCOUNTER — Ambulatory Visit: Payer: Medicare Other | Admitting: Occupational Therapy

## 2013-05-18 ENCOUNTER — Ambulatory Visit (INDEPENDENT_AMBULATORY_CARE_PROVIDER_SITE_OTHER): Payer: Medicare Other | Admitting: Physician Assistant

## 2013-05-18 VITALS — BP 126/66 | HR 54 | Temp 97.7°F | Resp 16 | Ht 63.0 in | Wt 151.0 lb

## 2013-05-18 DIAGNOSIS — M259 Joint disorder, unspecified: Secondary | ICD-10-CM | POA: Diagnosis not present

## 2013-05-18 DIAGNOSIS — M25529 Pain in unspecified elbow: Secondary | ICD-10-CM

## 2013-05-18 DIAGNOSIS — S51009A Unspecified open wound of unspecified elbow, initial encounter: Secondary | ICD-10-CM
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
## 2013-05-23 ENCOUNTER — Encounter: Payer: Medicare Other | Admitting: Occupational Therapy

## 2013-05-28 ENCOUNTER — Encounter: Payer: Medicare Other | Admitting: Occupational Therapy

## 2013-05-30 ENCOUNTER — Ambulatory Visit: Payer: Medicare Other | Attending: Neurology | Admitting: Occupational Therapy

## 2013-05-30 DIAGNOSIS — IMO0001 Reserved for inherently not codable concepts without codable children: Secondary | ICD-10-CM | POA: Diagnosis not present

## 2013-05-30 DIAGNOSIS — R269 Unspecified abnormalities of gait and mobility: Secondary | ICD-10-CM | POA: Diagnosis not present

## 2013-06-16 ENCOUNTER — Ambulatory Visit (INDEPENDENT_AMBULATORY_CARE_PROVIDER_SITE_OTHER): Payer: Medicare Other | Admitting: Neurology

## 2013-06-16 ENCOUNTER — Encounter: Payer: Self-pay | Admitting: Neurology

## 2013-06-16 VITALS — BP 130/68 | HR 54 | Ht 65.35 in | Wt 147.4 lb

## 2013-06-16 DIAGNOSIS — G20 Parkinson's disease: Secondary | ICD-10-CM | POA: Diagnosis not present

## 2013-06-16 MED ORDER — CARBIDOPA-LEVODOPA 25-100 MG PO TABS: ORAL_TABLET | ORAL | Status: DC

## 2013-06-16 MED ORDER — PAROXETINE HCL 10 MG PO TABS: 10.0000 mg | ORAL_TABLET | Freq: Every day | ORAL | Status: DC

## 2013-06-16 NOTE — Patient Instructions (Signed)
1.  Increase Sinemet to 1.5 tablets at 10am, 1 tablet at 2 pm, 1 tablet at 6 pm, and 1 tablet at 10pm.  Call on Wednesday with update and we can increase dose if needed.  2.  We will start paroxetine 10mg  at bedtime for depression.  It is a low dose so we will see how you tolerate.  Side effects may include sleepiness, dizziness or GI problems. 3.  Use cane at all times. 4.  Follow up in 6 weeks.  But call on Wednesday.
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
## 2013-06-19 ENCOUNTER — Telehealth: Payer: Self-pay | Admitting: Neurology

## 2013-06-19 NOTE — Telephone Encounter (Signed)
Patient called to let Dr Tomi Likens he  Is taking  A total of 4 and 1/2 tab of Sinemet IR and he sees some positive in it . He is also taking Paxil 10mg  at bedtime and also see's positive in that . Per DrJaffe I ask him to call me back in 1 week next Thursday to let us know how he is doing or earlier if something changes in how he is feeling

## 2013-06-19 NOTE — Telephone Encounter (Signed)
Pt needs to talk to someone about how he is doing please call [phone]

## 2013-06-21 ENCOUNTER — Telehealth: Payer: Self-pay | Admitting: Neurology

## 2013-06-21 NOTE — Telephone Encounter (Signed)
Mr. Kelley called with a concern.  Since starting the Paxil, he has been very tired during the day.  I recommended that he stop the Paxil and that we will follow up with him on Monday.  If he feels ill, he was instructed to call his son and/or go to the ED.

## 2013-06-23 ENCOUNTER — Ambulatory Visit: Payer: Medicare Other | Admitting: Neurology

## 2013-06-23 ENCOUNTER — Telehealth: Payer: Self-pay | Admitting: *Deleted

## 2013-06-23 NOTE — Telephone Encounter (Signed)
Spoke with patient this am he states he stopped the pail per Dr Tomi Likens and is feeling better . He ask could he take a smaller dose and per Dr Tomi Likens he will take  A 5 mg tablet at bedtime  He will also call me at the end of the week and let me know how he is doing

## 2013-07-15 ENCOUNTER — Telehealth: Payer: Self-pay | Admitting: Neurology

## 2013-07-15 ENCOUNTER — Telehealth: Payer: Self-pay | Admitting: *Deleted

## 2013-07-15 NOTE — Telephone Encounter (Signed)
Pt called requesting to speak to a nurse regarding his meds.

## 2013-07-15 NOTE — Telephone Encounter (Signed)
Does he want to go down on the dose or is he doing okay overall?

## 2013-07-15 NOTE — Telephone Encounter (Signed)
I left a message for patient to return call regarding medication

## 2013-07-15 NOTE — Telephone Encounter (Signed)
Patient called stating the Sinemet IR is making him more sleepy . He wanted to let you know

## 2013-07-16 NOTE — Telephone Encounter (Signed)
Pt needs to talk to someone please call [phone]

## 2013-07-17 ENCOUNTER — Telehealth: Payer: Self-pay | Admitting: Neurology

## 2013-07-17 NOTE — Telephone Encounter (Signed)
Please call patient at [phone] about medication

## 2013-07-17 NOTE — Telephone Encounter (Signed)
Patient wants to know if he can take 1 tablet in am and 1 tablet 3 times daily .( He is on 1.5 in am and 1 tab 3 times   Daily)

## 2013-07-18 NOTE — Telephone Encounter (Signed)
Yes.  He can take 1 tab at 10am, 1 tab at 2pm, 1 tab at 6pm and 1 tab at 10pm.

## 2013-07-28 ENCOUNTER — Ambulatory Visit: Payer: Medicare Other | Admitting: Neurology

## 2013-07-29 ENCOUNTER — Telehealth: Payer: Self-pay | Admitting: Neurology

## 2013-07-29 NOTE — Telephone Encounter (Signed)
Pt no showed 07/28/13 appt w/ Dr. Tomi Likens for follow up. No show letter mailed to pt / Sherri S.

## 2013-08-12 DIAGNOSIS — H6120 Impacted cerumen, unspecified ear: Secondary | ICD-10-CM | POA: Diagnosis not present

## 2013-08-12 DIAGNOSIS — H905 Unspecified sensorineural hearing loss: Secondary | ICD-10-CM | POA: Diagnosis not present

## 2013-08-18 ENCOUNTER — Encounter: Payer: Self-pay | Admitting: Neurology

## 2013-08-18 ENCOUNTER — Ambulatory Visit (INDEPENDENT_AMBULATORY_CARE_PROVIDER_SITE_OTHER): Payer: Medicare Other | Admitting: Neurology

## 2013-08-18 VITALS — BP 130/72 | HR 66 | Temp 98.2°F | Resp 16 | Wt 144.8 lb

## 2013-08-18 DIAGNOSIS — G20 Parkinson's disease: Secondary | ICD-10-CM

## 2013-08-18 MED ORDER — CARBIDOPA-LEVODOPA 25-100 MG PO TABS: 1.0000 | ORAL_TABLET | Freq: Four times a day (QID) | ORAL | Status: DC

## 2013-08-18 MED ORDER — PAROXETINE HCL 10 MG PO TABS: 5.0000 mg | ORAL_TABLET | Freq: Every day | ORAL | Status: DC

## 2013-08-18 NOTE — Patient Instructions (Signed)
1.  Take the Sinemet 1pill at 8am, 1 pill at 12 to 1pm, 1 pill at 5pm and 1 pill at 9pm 2.  Continue paxil 5mg  at bedtime 3.  Use cane at all times 4.  Continue physical therapy. 5.  Follow up in 4 months.
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
## 2013-08-28 ENCOUNTER — Encounter: Payer: Self-pay | Admitting: Cardiology

## 2013-09-15 DIAGNOSIS — E785 Hyperlipidemia, unspecified: Secondary | ICD-10-CM | POA: Diagnosis not present

## 2013-09-15 DIAGNOSIS — M810 Age-related osteoporosis without current pathological fracture: Secondary | ICD-10-CM | POA: Diagnosis not present

## 2013-09-15 DIAGNOSIS — I2510 Atherosclerotic heart disease of native coronary artery without angina pectoris: Secondary | ICD-10-CM | POA: Diagnosis not present

## 2013-09-15 DIAGNOSIS — D696 Thrombocytopenia, unspecified: Secondary | ICD-10-CM | POA: Diagnosis not present

## 2013-09-15 DIAGNOSIS — I10 Essential (primary) hypertension: Secondary | ICD-10-CM | POA: Diagnosis not present

## 2013-09-15 DIAGNOSIS — R7402 Elevation of levels of lactic acid dehydrogenase (LDH): Secondary | ICD-10-CM | POA: Diagnosis not present

## 2013-09-15 DIAGNOSIS — G20 Parkinson's disease: Secondary | ICD-10-CM | POA: Diagnosis not present

## 2013-09-15 DIAGNOSIS — I4891 Unspecified atrial fibrillation: Secondary | ICD-10-CM | POA: Diagnosis not present

## 2013-09-21 ENCOUNTER — Emergency Department (HOSPITAL_COMMUNITY): Payer: Medicare Other

## 2013-09-21 ENCOUNTER — Encounter (HOSPITAL_COMMUNITY): Payer: Self-pay | Admitting: Emergency Medicine

## 2013-09-21 ENCOUNTER — Inpatient Hospital Stay (HOSPITAL_COMMUNITY)
Admission: EM | Admit: 2013-09-21 | Discharge: 2013-09-25 | DRG: 371 | Disposition: A | Discharge status: Skilled Nursing Facility | Payer: Medicare Other | Attending: Endocrinology | Admitting: Endocrinology

## 2013-09-21 DIAGNOSIS — N400 Enlarged prostate without lower urinary tract symptoms: Secondary | ICD-10-CM | POA: Diagnosis present

## 2013-09-21 DIAGNOSIS — A0472 Enterocolitis due to Clostridium difficile, not specified as recurrent: Secondary | ICD-10-CM | POA: Diagnosis not present

## 2013-09-21 DIAGNOSIS — Z7901 Long term (current) use of anticoagulants: Secondary | ICD-10-CM

## 2013-09-21 DIAGNOSIS — D649 Anemia, unspecified: Secondary | ICD-10-CM | POA: Diagnosis present

## 2013-09-21 DIAGNOSIS — R269 Unspecified abnormalities of gait and mobility: Secondary | ICD-10-CM | POA: Diagnosis not present

## 2013-09-21 DIAGNOSIS — Z8673 Personal history of transient ischemic attack (TIA), and cerebral infarction without residual deficits: Secondary | ICD-10-CM

## 2013-09-21 DIAGNOSIS — I10 Essential (primary) hypertension: Secondary | ICD-10-CM | POA: Diagnosis present

## 2013-09-21 DIAGNOSIS — Z96659 Presence of unspecified artificial knee joint: Secondary | ICD-10-CM

## 2013-09-21 DIAGNOSIS — Z85828 Personal history of other malignant neoplasm of skin: Secondary | ICD-10-CM

## 2013-09-21 DIAGNOSIS — G20 Parkinson's disease: Secondary | ICD-10-CM | POA: Diagnosis present

## 2013-09-21 DIAGNOSIS — J189 Pneumonia, unspecified organism: Secondary | ICD-10-CM

## 2013-09-21 DIAGNOSIS — F329 Major depressive disorder, single episode, unspecified: Secondary | ICD-10-CM | POA: Diagnosis present

## 2013-09-21 DIAGNOSIS — R4182 Altered mental status, unspecified: Secondary | ICD-10-CM | POA: Diagnosis not present

## 2013-09-21 DIAGNOSIS — IMO0002 Reserved for concepts with insufficient information to code with codable children: Secondary | ICD-10-CM | POA: Diagnosis not present

## 2013-09-21 DIAGNOSIS — E785 Hyperlipidemia, unspecified: Secondary | ICD-10-CM | POA: Diagnosis not present

## 2013-09-21 DIAGNOSIS — M6281 Muscle weakness (generalized): Secondary | ICD-10-CM | POA: Diagnosis not present

## 2013-09-21 DIAGNOSIS — R197 Diarrhea, unspecified: Secondary | ICD-10-CM | POA: Diagnosis not present

## 2013-09-21 DIAGNOSIS — G20A1 Parkinson's disease without dyskinesia, without mention of fluctuations: Secondary | ICD-10-CM | POA: Diagnosis present

## 2013-09-21 DIAGNOSIS — Z9861 Coronary angioplasty status: Secondary | ICD-10-CM | POA: Diagnosis not present

## 2013-09-21 DIAGNOSIS — E440 Moderate protein-calorie malnutrition: Secondary | ICD-10-CM | POA: Diagnosis present

## 2013-09-21 DIAGNOSIS — R627 Adult failure to thrive: Secondary | ICD-10-CM | POA: Diagnosis present

## 2013-09-21 DIAGNOSIS — F3289 Other specified depressive episodes: Secondary | ICD-10-CM | POA: Diagnosis present

## 2013-09-21 DIAGNOSIS — I2510 Atherosclerotic heart disease of native coronary artery without angina pectoris: Secondary | ICD-10-CM | POA: Diagnosis present

## 2013-09-21 DIAGNOSIS — M159 Polyosteoarthritis, unspecified: Secondary | ICD-10-CM | POA: Diagnosis not present

## 2013-09-21 DIAGNOSIS — I4891 Unspecified atrial fibrillation: Secondary | ICD-10-CM | POA: Diagnosis not present

## 2013-09-21 DIAGNOSIS — R531 Weakness: Secondary | ICD-10-CM

## 2013-09-21 DIAGNOSIS — D509 Iron deficiency anemia, unspecified: Secondary | ICD-10-CM | POA: Diagnosis not present

## 2013-09-21 DIAGNOSIS — E46 Unspecified protein-calorie malnutrition: Secondary | ICD-10-CM | POA: Diagnosis not present

## 2013-09-21 DIAGNOSIS — B999 Unspecified infectious disease: Secondary | ICD-10-CM | POA: Diagnosis not present

## 2013-09-21 DIAGNOSIS — A044 Other intestinal Escherichia coli infections: Secondary | ICD-10-CM | POA: Diagnosis not present

## 2013-09-21 DIAGNOSIS — R5383 Other fatigue: Secondary | ICD-10-CM | POA: Diagnosis not present

## 2013-09-21 DIAGNOSIS — R5381 Other malaise: Secondary | ICD-10-CM | POA: Diagnosis not present

## 2013-09-21 DIAGNOSIS — F32A Depression, unspecified: Secondary | ICD-10-CM | POA: Diagnosis present

## 2013-09-21 DIAGNOSIS — R509 Fever, unspecified: Secondary | ICD-10-CM | POA: Diagnosis not present

## 2013-09-21 LAB — CBC
HEMATOCRIT: 27.4 % — AB (ref 39.0–52.0)
Hemoglobin: 9 g/dL — ABNORMAL LOW (ref 13.0–17.0)
MCH: 30.5 pg (ref 26.0–34.0)
MCHC: 32.8 g/dL (ref 30.0–36.0)
MCV: 92.9 fL (ref 78.0–100.0)
Platelets: 130 10*3/uL — ABNORMAL LOW (ref 150–400)
RBC: 2.95 MIL/uL — ABNORMAL LOW (ref 4.22–5.81)
RDW: 14 % (ref 11.5–15.5)
WBC: 15.4 10*3/uL — AB (ref 4.0–10.5)

## 2013-09-21 LAB — URINE MICROSCOPIC-ADD ON

## 2013-09-21 LAB — COMPREHENSIVE METABOLIC PANEL
ALBUMIN: 3.4 g/dL — AB (ref 3.5–5.2)
ALT: <5 U/L (ref 0–53)
ANION GAP: 13 (ref 5–15)
AST: 17 U/L (ref 0–37)
Alkaline Phosphatase: 59 U/L (ref 39–117)
BUN: 21 mg/dL (ref 6–23)
CALCIUM: 8.7 mg/dL (ref 8.4–10.5)
CHLORIDE: 99 meq/L (ref 96–112)
CO2: 24 mEq/L (ref 19–32)
CREATININE: 1.04 mg/dL (ref 0.50–1.35)
GFR calc Af Amer: 73 mL/min — ABNORMAL LOW (ref >90)
GFR calc non Af Amer: 63 mL/min — ABNORMAL LOW (ref >90)
GLUCOSE: 107 mg/dL — AB (ref 70–99)
POTASSIUM: 3.9 meq/L (ref 3.7–5.3)
SODIUM: 136 meq/L — AB (ref 137–147)
TOTAL PROTEIN: 6.6 g/dL (ref 6.0–8.3)
Total Bilirubin: 0.9 mg/dL (ref 0.3–1.2)

## 2013-09-21 LAB — URINALYSIS, ROUTINE W REFLEX MICROSCOPIC
Bilirubin Urine: NEGATIVE
Glucose, UA: NEGATIVE mg/dL
Hgb urine dipstick: NEGATIVE
Ketones, ur: NEGATIVE mg/dL
LEUKOCYTES UA: NEGATIVE
NITRITE: NEGATIVE
PH: 5 (ref 5.0–8.0)
Protein, ur: 30 mg/dL — AB
Specific Gravity, Urine: 1.02 (ref 1.005–1.030)
Urobilinogen, UA: 1 mg/dL (ref 0.0–1.0)

## 2013-09-21 LAB — I-STAT CG4 LACTIC ACID, ED: Lactic Acid, Venous: 0.77 mmol/L (ref 0.5–2.2)

## 2013-09-21 MED ORDER — SODIUM CHLORIDE 0.9 % IV BOLUS (SEPSIS)
1000.0000 mL | Freq: Once | INTRAVENOUS | Status: AC
  Administered 2013-09-21: 1000 mL via INTRAVENOUS

## 2013-09-21 MED ORDER — DEXTROSE 5 % IV SOLN
1.0000 g | Freq: Once | INTRAVENOUS | Status: AC
  Administered 2013-09-21: 1 g via INTRAVENOUS
  Filled 2013-09-21: qty 10

## 2013-09-21 MED ORDER — DEXTROSE 5 % IV SOLN
500.0000 mg | Freq: Once | INTRAVENOUS | Status: AC
  Administered 2013-09-22: 500 mg via INTRAVENOUS
  Filled 2013-09-21 (×2): qty 500

## 2013-09-21 NOTE — ED Notes (Signed)
Per [facility]- per family decreased mobility x 2 days. Normal cane assisted with stride. Sudden onset with change Generalized weakness. Moderate assistance with ambulation and ADL. Gait unsteady.  Son states diarrhea in AM x 3 days. Disoriented to self and time. Oriented to situation and family. NEG FOR STROKE. Orthostatic changes sitting BP 120 pal HR 68 standing BP 98/78 HR 74. Reassessment of orientation upon arrival. Oriented to self.  PEERL.

## 2013-09-21 NOTE — ED Notes (Signed)
Family at bedside. 
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
## 2013-09-21 NOTE — ED Notes (Signed)
Bed: IN86 Expected date:  Expected time:  Means of arrival:  Comments: EMS confused, ortho static, unable to walk
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
## 2013-09-22 ENCOUNTER — Inpatient Hospital Stay (HOSPITAL_COMMUNITY): Payer: Medicare Other

## 2013-09-22 ENCOUNTER — Telehealth: Payer: Self-pay | Admitting: *Deleted

## 2013-09-22 ENCOUNTER — Encounter (HOSPITAL_COMMUNITY): Payer: Self-pay

## 2013-09-22 DIAGNOSIS — F32A Depression, unspecified: Secondary | ICD-10-CM | POA: Diagnosis present

## 2013-09-22 DIAGNOSIS — A0472 Enterocolitis due to Clostridium difficile, not specified as recurrent: Secondary | ICD-10-CM | POA: Diagnosis present

## 2013-09-22 DIAGNOSIS — F329 Major depressive disorder, single episode, unspecified: Secondary | ICD-10-CM | POA: Diagnosis present

## 2013-09-22 LAB — CBC
HEMATOCRIT: 34.2 % — AB (ref 39.0–52.0)
HEMOGLOBIN: 11.2 g/dL — AB (ref 13.0–17.0)
MCH: 30.4 pg (ref 26.0–34.0)
MCHC: 32.7 g/dL (ref 30.0–36.0)
MCV: 92.7 fL (ref 78.0–100.0)
PLATELETS: 102 10*3/uL — AB (ref 150–400)
RBC: 3.69 MIL/uL — ABNORMAL LOW (ref 4.22–5.81)
RDW: 14 % (ref 11.5–15.5)
WBC: 12.3 10*3/uL — AB (ref 4.0–10.5)

## 2013-09-22 LAB — RETICULOCYTES
RBC.: 3.69 MIL/uL — ABNORMAL LOW (ref 4.22–5.81)
Retic Count, Absolute: 22.1 10*3/uL (ref 19.0–186.0)
Retic Ct Pct: 0.6 % (ref 0.4–3.1)

## 2013-09-22 LAB — BASIC METABOLIC PANEL
Anion gap: 12 (ref 5–15)
BUN: 18 mg/dL (ref 6–23)
CHLORIDE: 101 meq/L (ref 96–112)
CO2: 24 meq/L (ref 19–32)
Calcium: 8.3 mg/dL — ABNORMAL LOW (ref 8.4–10.5)
Creatinine, Ser: 1.01 mg/dL (ref 0.50–1.35)
GFR calc Af Amer: 76 mL/min — ABNORMAL LOW (ref >90)
GFR, EST NON AFRICAN AMERICAN: 65 mL/min — AB (ref >90)
Glucose, Bld: 105 mg/dL — ABNORMAL HIGH (ref 70–99)
POTASSIUM: 3.8 meq/L (ref 3.7–5.3)
SODIUM: 137 meq/L (ref 137–147)

## 2013-09-22 LAB — IRON AND TIBC
IRON: 12 ug/dL — AB (ref 42–135)
Saturation Ratios: 6 % — ABNORMAL LOW (ref 20–55)
TIBC: 202 ug/dL — AB (ref 215–435)
UIBC: 190 ug/dL (ref 125–400)

## 2013-09-22 LAB — CLOSTRIDIUM DIFFICILE BY PCR: Toxigenic C. Difficile by PCR: POSITIVE — AB

## 2013-09-22 LAB — FOLATE: FOLATE: 13.9 ng/mL

## 2013-09-22 LAB — PROTIME-INR
INR: 3.01 — ABNORMAL HIGH (ref 0.00–1.49)
Prothrombin Time: 31.2 seconds — ABNORMAL HIGH (ref 11.6–15.2)

## 2013-09-22 LAB — FERRITIN: FERRITIN: 178 ng/mL (ref 22–322)

## 2013-09-22 LAB — VITAMIN B12: Vitamin B-12: 546 pg/mL (ref 211–911)

## 2013-09-22 MED ORDER — PAROXETINE HCL 10 MG PO TABS
10.0000 mg | ORAL_TABLET | Freq: Every day | ORAL | Status: DC
  Administered 2013-09-23 – 2013-09-25 (×3): 10 mg via ORAL
  Filled 2013-09-22 (×3): qty 1

## 2013-09-22 MED ORDER — ONDANSETRON HCL 4 MG/2ML IJ SOLN: 4.0000 mg | Freq: Four times a day (QID) | INTRAMUSCULAR | Status: DC | PRN

## 2013-09-22 MED ORDER — ADULT MULTIVITAMIN W/MINERALS CH
1.0000 | ORAL_TABLET | Freq: Every day | ORAL | Status: DC
  Administered 2013-09-22 – 2013-09-25 (×4): 1 via ORAL
  Filled 2013-09-22 (×4): qty 1

## 2013-09-22 MED ORDER — DEXTROSE 5 % IV SOLN: 500.0000 mg | INTRAVENOUS | Status: DC

## 2013-09-22 MED ORDER — ACETAMINOPHEN 650 MG RE SUPP: 650.0000 mg | Freq: Four times a day (QID) | RECTAL | Status: DC | PRN

## 2013-09-22 MED ORDER — WARFARIN SODIUM 6 MG PO TABS
9.0000 mg | ORAL_TABLET | Freq: Once | ORAL | Status: AC
  Administered 2013-09-22: 9 mg via ORAL
  Filled 2013-09-22: qty 1

## 2013-09-22 MED ORDER — ZOLPIDEM TARTRATE 5 MG PO TABS: 5.0000 mg | ORAL_TABLET | Freq: Every evening | ORAL | Status: DC | PRN

## 2013-09-22 MED ORDER — POTASSIUM CHLORIDE IN NACL 20-0.9 MEQ/L-% IV SOLN
INTRAVENOUS | Status: DC
  Administered 2013-09-22 – 2013-09-25 (×6): via INTRAVENOUS
  Filled 2013-09-22 (×10): qty 1000

## 2013-09-22 MED ORDER — TRAMADOL-ACETAMINOPHEN 37.5-325 MG PO TABS
1.0000 | ORAL_TABLET | Freq: Four times a day (QID) | ORAL | Status: DC | PRN
  Administered 2013-09-22: 1 via ORAL
  Filled 2013-09-22: qty 1

## 2013-09-22 MED ORDER — DEXTROSE 5 % IV SOLN: 1.0000 g | INTRAVENOUS | Status: DC

## 2013-09-22 MED ORDER — DOCUSATE SODIUM 100 MG PO CAPS
100.0000 mg | ORAL_CAPSULE | Freq: Two times a day (BID) | ORAL | Status: DC
  Administered 2013-09-22: 100 mg via ORAL
  Filled 2013-09-22 (×2): qty 1

## 2013-09-22 MED ORDER — AMLODIPINE BESYLATE 2.5 MG PO TABS
2.5000 mg | ORAL_TABLET | Freq: Every day | ORAL | Status: DC
  Administered 2013-09-22: 2.5 mg via ORAL
  Filled 2013-09-22 (×2): qty 1

## 2013-09-22 MED ORDER — WARFARIN - PHARMACIST DOSING INPATIENT: Freq: Every day | Status: DC

## 2013-09-22 MED ORDER — ENSURE COMPLETE PO LIQD
237.0000 mL | Freq: Three times a day (TID) | ORAL | Status: DC
Start: 2013-09-22 — End: 2013-09-25
  Administered 2013-09-23 – 2013-09-25 (×6): 237 mL via ORAL

## 2013-09-22 MED ORDER — ONDANSETRON HCL 4 MG PO TABS: 4.0000 mg | ORAL_TABLET | Freq: Four times a day (QID) | ORAL | Status: DC | PRN

## 2013-09-22 MED ORDER — WARFARIN SODIUM 6 MG PO TABS: 9.0000 mg | ORAL_TABLET | Freq: Every day | ORAL | Status: DC

## 2013-09-22 MED ORDER — ACETAMINOPHEN 325 MG PO TABS
650.0000 mg | ORAL_TABLET | Freq: Four times a day (QID) | ORAL | Status: DC | PRN
Start: 2013-09-22 — End: 2013-09-25

## 2013-09-22 MED ORDER — METRONIDAZOLE 500 MG PO TABS
500.0000 mg | ORAL_TABLET | Freq: Three times a day (TID) | ORAL | Status: DC
  Administered 2013-09-22 – 2013-09-25 (×9): 500 mg via ORAL
  Filled 2013-09-22 (×12): qty 1

## 2013-09-22 MED ORDER — MELATONIN 3 MG PO TABS: 3.0000 mg | ORAL_TABLET | Freq: Every day | ORAL | Status: DC

## 2013-09-22 MED ORDER — POLYETHYLENE GLYCOL 3350 17 G PO PACK
17.0000 g | PACK | Freq: Every day | ORAL | Status: DC | PRN
  Filled 2013-09-22: qty 1

## 2013-09-22 MED ORDER — ALUM & MAG HYDROXIDE-SIMETH 200-200-20 MG/5ML PO SUSP: 30.0000 mL | Freq: Four times a day (QID) | ORAL | Status: DC | PRN

## 2013-09-22 MED ORDER — PAROXETINE HCL 10 MG PO TABS
5.0000 mg | ORAL_TABLET | Freq: Every day | ORAL | Status: DC
  Administered 2013-09-22: 5 mg via ORAL
  Filled 2013-09-22: qty 0.5

## 2013-09-22 MED ORDER — AZITHROMYCIN 250 MG PO TABS
250.0000 mg | ORAL_TABLET | Freq: Every day | ORAL | Status: DC
  Administered 2013-09-22 – 2013-09-24 (×3): 250 mg via ORAL
  Filled 2013-09-22 (×5): qty 1

## 2013-09-22 MED ORDER — CARBIDOPA-LEVODOPA 25-100 MG PO TABS
1.0000 | ORAL_TABLET | ORAL | Status: DC
  Administered 2013-09-22 – 2013-09-25 (×14): 1 via ORAL
  Filled 2013-09-22 (×18): qty 1

## 2013-09-22 NOTE — Progress Notes (Addendum)
Subjective: Still feels weak. Tmax 101, now defervesced. Not coughing much. 3 loose stools today and Cdiff just returned as positive.  Some abd cramping. No other pain. No cardiac sxs   Objective: Vital signs in last 24 hours: Temp:  [97.9 F (36.6 C)-101.3 F (38.5 C)] 97.9 F (36.6 C) (08/17 0529) Pulse Rate:  [56-64] 59 (08/17 0529) Resp:  [17-22] 22 (08/17 0529) BP: (132-161)/(60-70) 157/69 mmHg (08/17 0529) SpO2:  [95 %-100 %] 100 % (08/17 0529) Weight:  [66.407 kg (146 lb 6.4 oz)] 66.407 kg (146 lb 6.4 oz) (08/17 0044)  Intake/Output from previous day: 08/16 0701 - 08/17 0700 In: -  Out: 1 [Stool:1] Intake/Output this shift:    Frail non toxic, anicteric, neck supple. Lungs sl diminished, no wheeze, ht regular in SR it appear. abd soft NT, sl distended. Hyperactive BS's. extrems no edema. Awake. Clear low volume speech, no tremor, mild cogwheeling  Lab Results   Recent Labs  09/21/13 2116 09/22/13 0059  WBC 15.4* 12.3*  RBC 2.95* 3.69*  3.69*  HGB 9.0* 11.2*  HCT 27.4* 34.2*  MCV 92.9 92.7  MCH 30.5 30.4  RDW 14.0 14.0  PLT 130* 102*    Recent Labs  09/21/13 2116 09/22/13 0059  NA 136* 137  K 3.9 3.8  CL 99 101  CO2 24 24  GLUCOSE 107* 105*  BUN 21 18  CREATININE 1.04 1.01  CALCIUM 8.7 8.3*  Results for PRAJWAL, FELLNER (MRN [id number]) as of 09/22/2013 11:16  Ref. Range 09/22/2013 01:00  Prothrombin Time Latest Range: 11.6-15.2 seconds 31.2 (H)  INR Latest Range: 0.00-1.49  3.01 (H)    Studies/Results: Dg Chest 2 View  09/22/2013   CLINICAL DATA:  weakness, altered mental status  EXAM: CHEST - 2 VIEW  COMPARISON:  09/21/2013  FINDINGS: Mild cardiomegaly without CHF. Aortic is ectatic and atherosclerotic with slight tortuosity. Low lung volumes. Lungs remain clear. No CHF, collapse or consolidation. No edema, effusion or pneumothorax. Prior lower thoracic and lumbar vertebral augmentations noted. Overall stable exam.  IMPRESSION: Stable chronic  findings.  No superimposed acute process.   Electronically Signed   By: Daryll Brod M.D.   On: 09/22/2013 09:22   Dg Chest Port 1 View  09/21/2013   CLINICAL DATA:  Weakness and fever.  EXAM: PORTABLE CHEST - 1 VIEW  COMPARISON:  Chest radiograph September 05, 2012  FINDINGS: Cardiac silhouette appears mildly enlarged. Tortuous calcified aorta. Slightly increased interstitial prominence without pleural effusions or focal consolidations. No pneumothorax.  Osteopenia. Remote right posterior rib fractures. Multilevel thoracolumbar vertebral body cement augmentation. High-riding right shoulder suggest remote rotator cuff injury. Multiple EKG lines overlie the patient and may obscure subtle underlying pathology.  IMPRESSION: Increasing interstitial prominence could reflect atypical infection or less likely pulmonary edema without focal consolidation.  Mild cardiomegaly.   Electronically Signed   By: Elon Alas   On: 09/21/2013 23:15   Sinus rhythm Left ventricular hypertrophy Confirmed by RAY MD, Andee Poles ([id number]) on 09/21/2013 11:36:38 PM Scheduled Meds: . amLODipine  2.5 mg Oral Daily  . [START ON 09/23/2013] azithromycin  500 mg Intravenous Q24H  . carbidopa-levodopa  1 tablet Oral 4 times per day  . cefTRIAXone (ROCEPHIN)  IV  1 g Intravenous Q24H  . docusate sodium  100 mg Oral BID  . metroNIDAZOLE  500 mg Oral 3 times per day  . multivitamin with minerals  1 tablet Oral Daily  . PARoxetine  5 mg Oral Daily  . warfarin  9  mg Oral ONCE-1800  . Warfarin - Pharmacist Dosing Inpatient   Does not apply q1800   Continuous Infusions: . 0.9 % NaCl with KCl 20 mEq / L 75 mL/hr at 09/22/13 0122   PRN Meds:acetaminophen, acetaminophen, alum & mag hydroxide-simeth, ondansetron (ZOFRAN) IV, ondansetron, polyethylene glycol, traMADol-acetaminophen, zolpidem  Assessment/Plan: C DIFF COLITIS: a new issue. May account for fever and leukocytosis. Will reduce abx and stop colace ? PNEUMONIA:  None seen on  XR. Stop rocephin PARKINSONS: doing fair at best. Not much to do different inpatient DEPRESSION: doing fair, having a tough time with wife's health issue and recent transfer to SNF HYPERTENSION: BP OK CAD: doing OK HX AFIB:in SR on exam and by EKG.  On long term coumadin UNSTABLE GAIT: would like to have him get PT but will let Cdiff settle down first ADULT FTT: has gone down a lot in the past year or so HX TIA: stable, on warfarin  ANTICOAGULATION: Sl supratherapeutic, no bleeding noted  LOS: 1 day   Destane Speas ALAN 09/22/2013, 11:08 AM

## 2013-09-22 NOTE — Progress Notes (Signed)
PHARMACIST - PHYSICIAN ORDER COMMUNICATION  CONCERNING: P&T Medication Policy on Herbal Medications  DESCRIPTION:  This patient's order for:  MELATONIN  has been noted.  This product(s) is classified as an "herbal" or natural product. Due to a lack of definitive safety studies or FDA approval, nonstandard manufacturing practices, plus the potential risk of unknown drug-drug interactions while on inpatient medications, the Pharmacy and Therapeutics Committee does not permit the use of "herbal" or natural products of this type within [facility].   ACTION TAKEN: The pharmacy department is unable to verify this order at this time and your patient has been informed of this safety policy. Please reevaluate patient's clinical condition at discharge and address if the herbal or natural product(s) should be resumed at that time.  Thank you, Rogerick Baldwin, PharmD 

## 2013-09-22 NOTE — Telephone Encounter (Signed)
FYI: Patients son called to let you know his father is in the hospital Call back [phone] [name])

## 2013-09-22 NOTE — Evaluation (Signed)
Physical Therapy Evaluation Patient Details Name: Henry Alvarez MRN: [id number] DOB: Nov 07, 1924 Today's Date: 09/22/2013   History of Present Illness  80yo male admitted8/16 after son Noted that on Friday he began having greatly decreased activity and has had difficulty getting around the house. He has had some confusion as well and is not talkative currently. He has not been eating or drinking as well. He has had loose stools each day as well and is thought to be a bit dehydrated as well.    Clinical Impression  Pt mobilized   To room with 1 assist. Pt will benefit from PT to address problems listed in chart. Patient's wife recently moved to SNF.Pt states he  Can go too if needed.    Follow Up Recommendations SNF;Supervision/Assistance - 24 hour    Equipment Recommendations  None recommended by PT    Recommendations for Other Services       Precautions / Restrictions Precautions Precautions: Fall Precaution Comments: incontinent      Mobility  Bed Mobility Overal bed mobility: Needs Assistance Bed Mobility: Supine to Sit     Supine to sit: Min assist     General bed mobility comments: extra time, delayed response to move  Transfers Overall transfer level: Needs assistance Equipment used: Rolling walker (2 wheeled) Transfers: Sit to/from Stand Sit to Stand: Mod assist;From elevated surface         General transfer comment: cues for hand placement, decreased processing to turn,back up, sit down  Ambulation/Gait Ambulation/Gait assistance: Mod assist Ambulation Distance (Feet): 20 Feet (x2) Assistive device: Rolling walker (2 wheeled) Gait Pattern/deviations: Step-through pattern;Shuffle;Decreased stride length;Trunk flexed;Narrow base of support Gait velocity: slow Gait velocity interpretation: <1.8 ft/sec, indicative of risk for recurrent falls General Gait Details: cues for safe use of RW, assist fro turning  Stairs            Wheelchair Mobility     Modified Rankin (Stroke Patients Only)       Balance Overall balance assessment: Needs assistance;History of Falls Sitting-balance support: No upper extremity supported;Feet supported Sitting balance-Leahy Scale: Fair     Standing balance support: Single extremity supported;During functional activity Standing balance-Leahy Scale: Poor                               Pertinent Vitals/Pain Pain Assessment: No/denies pain    Home Living Family/patient expects to be discharged to:: Private residence Living Arrangements:  (wife was just transferred to [facility] last week-) Available Help at Discharge: [facility]             Additional Comments: has 4 hours caregiver., limited driving    Prior Function Level of Independence: Independent with assistive device(s)               Hand Dominance        Extremity/Trunk Assessment   Upper Extremity Assessment: Generalized weakness           Lower Extremity Assessment: Generalized weakness      Cervical / Trunk Assessment: Kyphotic  Communication   Communication: No difficulties  Cognition Arousal/Alertness: Awake/alert Behavior During Therapy: WFL for tasks assessed/performed Overall Cognitive Status: Impaired/Different from baseline Area of Impairment: Orientation;Following commands Orientation Level: Time     Following Commands: Follows one step commands consistently            General Comments      Exercises  Assessment/Plan    PT Assessment Patient needs continued PT services  PT Diagnosis Difficulty walking;Generalized weakness   PT Problem List Decreased strength;Decreased activity tolerance;Decreased mobility;Decreased safety awareness;Decreased knowledge of use of DME;Decreased cognition;Decreased knowledge of precautions  PT Treatment Interventions DME instruction;Gait training;Functional mobility training;Balance training;Therapeutic  exercise;Therapeutic activities;Patient/family education   PT Goals (Current goals can be found in the Care Plan section) Acute Rehab PT Goals Patient Stated Goal: to get up and walk PT Goal Formulation: With patient Time For Goal Achievement: 10/06/13 Potential to Achieve Goals: Good    Frequency Min 3X/week   Barriers to discharge Decreased caregiver support      Co-evaluation               End of Session   Activity Tolerance: Patient tolerated treatment well Patient left: in chair;with call bell/phone within reach;with chair alarm set Nurse Communication: Mobility status         Time: 7408-1448 PT Time Calculation (min): 25 min   Charges:   PT Evaluation $Initial PT Evaluation Tier I: 1 Procedure PT Treatments $Gait Training: 23-37 mins   PT G Codes:          Claretha Cooper 09/22/2013, 11:01 AM

## 2013-09-22 NOTE — Telephone Encounter (Signed)
See below note.
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
## 2013-09-23 LAB — URINE CULTURE
COLONY COUNT: NO GROWTH
CULTURE: NO GROWTH
Special Requests: NORMAL

## 2013-09-23 LAB — COMPREHENSIVE METABOLIC PANEL
ALT: <5 U/L (ref 0–53)
AST: 18 U/L (ref 0–37)
Albumin: 2.6 g/dL — ABNORMAL LOW (ref 3.5–5.2)
Alkaline Phosphatase: 74 U/L (ref 39–117)
Anion gap: 11 (ref 5–15)
BUN: 12 mg/dL (ref 6–23)
CALCIUM: 8.2 mg/dL — AB (ref 8.4–10.5)
CO2: 22 mEq/L (ref 19–32)
CREATININE: 0.97 mg/dL (ref 0.50–1.35)
Chloride: 107 mEq/L (ref 96–112)
GFR calc non Af Amer: 73 mL/min — ABNORMAL LOW (ref >90)
GFR, EST AFRICAN AMERICAN: 84 mL/min — AB (ref >90)
GLUCOSE: 93 mg/dL (ref 70–99)
Potassium: 4.1 mEq/L (ref 3.7–5.3)
Sodium: 140 mEq/L (ref 137–147)
Total Bilirubin: 0.6 mg/dL (ref 0.3–1.2)
Total Protein: 5.6 g/dL — ABNORMAL LOW (ref 6.0–8.3)

## 2013-09-23 LAB — CBC
HCT: 32.1 % — ABNORMAL LOW (ref 39.0–52.0)
Hemoglobin: 10.7 g/dL — ABNORMAL LOW (ref 13.0–17.0)
MCH: 30.8 pg (ref 26.0–34.0)
MCHC: 33.3 g/dL (ref 30.0–36.0)
MCV: 92.5 fL (ref 78.0–100.0)
PLATELETS: 106 10*3/uL — AB (ref 150–400)
RBC: 3.47 MIL/uL — AB (ref 4.22–5.81)
RDW: 14 % (ref 11.5–15.5)
WBC: 10.5 10*3/uL (ref 4.0–10.5)

## 2013-09-23 LAB — PROTIME-INR
INR: 3.56 — ABNORMAL HIGH (ref 0.00–1.49)
Prothrombin Time: 35.6 seconds — ABNORMAL HIGH (ref 11.6–15.2)

## 2013-09-23 MED ORDER — AMLODIPINE BESYLATE 5 MG PO TABS
5.0000 mg | ORAL_TABLET | Freq: Every day | ORAL | Status: DC
  Administered 2013-09-23 – 2013-09-25 (×3): 5 mg via ORAL
  Filled 2013-09-23 (×3): qty 1

## 2013-09-23 NOTE — Progress Notes (Signed)
ANTICOAGULATION CONSULT NOTE - Follow Up  Pharmacy Consult for Warfarin Indication: atrial fibrillation  No Known Allergies  Patient Measurements: TBW: 66.4kg  Vital Signs: Temp: 98.3 F (36.8 C) (08/18 0530) Temp src: Oral (08/18 0530) BP: 166/67 mmHg (08/18 0530) Pulse Rate: 57 (08/18 0530)  Labs:  Recent Labs  09/21/13 2116 09/22/13 0059 09/22/13 0100 09/23/13 0524  HGB 9.0* 11.2*  --  10.7*  HCT 27.4* 34.2*  --  32.1*  PLT 130* 102*  --  106*  LABPROT  --   --  31.2* 35.6*  INR  --   --  3.01* 3.56*  CREATININE 1.04 1.01  --  0.97   Estimated Creatinine Clearance: 51.1 ml/min (by C-G formula based on Cr of 0.97).  Medications:  Scheduled:  . amLODipine  5 mg Oral Daily  . azithromycin  250 mg Oral QHS  . carbidopa-levodopa  1 tablet Oral 4 times per day  . feeding supplement (ENSURE COMPLETE)  237 mL Oral TID BM  . metroNIDAZOLE  500 mg Oral 3 times per day  . multivitamin with minerals  1 tablet Oral Daily  . PARoxetine  10 mg Oral Daily  . Warfarin - Pharmacist Dosing Inpatient   Does not apply q1800   Assessment: 79 yoM with h/o AFib, CAD and parkinson's with complaint of weakness x 2 days. Chest CT with possible pneumonia treated with Rocephin x1 & Azithromycin po. Stools positive for CDiff  PTA on warfarin 9mg  daily - last dose taken 8/16 @ 1pm  INR on admit was 3.01, Warfarin 9mg  given 8/17 pm  Metronidazole begun 8/17, will increase INR  INR today 3.56 is above goal range, regular diet ordered  CBC today low, stable  Goal of Therapy:  INR 2-3   Plan:   No Warfarin today  Daily Protime/INR  Minda Ditto PharmD Pager [phone] 09/23/2013, 8:51 AM

## 2013-09-23 NOTE — Progress Notes (Signed)
Clinical Social Work Department BRIEF PSYCHOSOCIAL ASSESSMENT 09/23/2013  Patient:  Henry Alvarez, Henry Alvarez     Account Number:  [id number]     Admit date:  09/21/2013  Clinical Social Worker:  Renold Genta  Date/Time:  09/23/2013 11:55 AM  Referred by:  Physician  Date Referred:  09/23/2013 Referred for  SNF Placement   Other Referral:   Interview type:  Patient Other interview type:    PSYCHOSOCIAL DATA Living Status:  ALONE Admitted from facility:   Level of care:   Primary support name:  Henry Alvarez (son) ph#: [phone] Primary support relationship to patient:  CHILD, ADULT Degree of support available:   good    CURRENT CONCERNS Current Concerns  Post-Acute Placement   Other Concerns:    SOCIAL WORK ASSESSMENT / PLAN CSW received consult & reviewed PT evaluation recommending SNF placement at discharge.   Assessment/plan status:  Information/Referral to [employer] Other assessment/ plan:   Information/referral to community resources:   CSW completed FL2 and faxed information out to [facility] SNFs - left message for [place] [facility] that patient would prefer to go there.    PATIENT'S/FAMILY'S RESPONSE TO PLAN OF CARE: Patient informed CSW that his wife is currently @ [facility] for rehab, but possibly long term care. Patient agreed with plan for SNF at discharge, stating that Dr. Forde Dandy recommended that he get rehab before returning home.       [name], [facility] Clinical Social Worker cell #: [phone]

## 2013-09-23 NOTE — Progress Notes (Signed)
Subjective: Had a good night. No abd pain  No cough or SOB No other pain. Slept well Objective: Vital signs in last 24 hours: Temp:  [98.3 F (36.8 C)-98.6 F (37 C)] 98.3 F (36.8 C) (08/18 0530) Pulse Rate:  [57-59] 57 (08/18 0530) Resp:  [20-22] 22 (08/18 0530) BP: (134-166)/(67-76) 166/67 mmHg (08/18 0530) SpO2:  [97 %-100 %] 97 % (08/18 0530)  Intake/Output from previous day: 08/17 0701 - 08/18 0700 In: 2707.1 [I.V.:2707.1] Out: 6 [Urine:6] Intake/Output this shift:    Frail, nontoxic, lying nearly flat without dyspnea, oral membranes moist, lungs clear, no wheeze, ht regular, slow, abd soft, non distended. Good BS's. Awake, mentating well. Low volume speech, no tremor  Lab Results   Recent Labs  09/22/13 0059 09/23/13 0524  WBC 12.3* 10.5  RBC 3.69*  3.69* 3.47*  HGB 11.2* 10.7*  HCT 34.2* 32.1*  MCV 92.7 92.5  MCH 30.4 30.8  RDW 14.0 14.0  PLT 102* 106*    Recent Labs  09/22/13 0059 09/23/13 0524  NA 137 140  K 3.8 4.1  CL 101 107  CO2 24 22  GLUCOSE 105* 93  BUN 18 12  CREATININE 1.01 0.97  CALCIUM 8.3* 8.2*    Studies/Results: Dg Chest 2 View  09/22/2013   CLINICAL DATA:  weakness, altered mental status  EXAM: CHEST - 2 VIEW  COMPARISON:  09/21/2013  FINDINGS: Mild cardiomegaly without CHF. Aortic is ectatic and atherosclerotic with slight tortuosity. Low lung volumes. Lungs remain clear. No CHF, collapse or consolidation. No edema, effusion or pneumothorax. Prior lower thoracic and lumbar vertebral augmentations noted. Overall stable exam.  IMPRESSION: Stable chronic findings.  No superimposed acute process.   Electronically Signed   By: Daryll Brod M.D.   On: 09/22/2013 09:22   Dg Chest Port 1 View  09/21/2013   CLINICAL DATA:  Weakness and fever.  EXAM: PORTABLE CHEST - 1 VIEW  COMPARISON:  Chest radiograph September 05, 2012  FINDINGS: Cardiac silhouette appears mildly enlarged. Tortuous calcified aorta. Slightly increased interstitial prominence  without pleural effusions or focal consolidations. No pneumothorax.  Osteopenia. Remote right posterior rib fractures. Multilevel thoracolumbar vertebral body cement augmentation. High-riding right shoulder suggest remote rotator cuff injury. Multiple EKG lines overlie the patient and may obscure subtle underlying pathology.  IMPRESSION: Increasing interstitial prominence could reflect atypical infection or less likely pulmonary edema without focal consolidation.  Mild cardiomegaly.   Electronically Signed   By: Elon Alas   On: 09/21/2013 23:15    Scheduled Meds: . amLODipine  5 mg Oral Daily  . azithromycin  250 mg Oral QHS  . carbidopa-levodopa  1 tablet Oral 4 times per day  . feeding supplement (ENSURE COMPLETE)  237 mL Oral TID BM  . metroNIDAZOLE  500 mg Oral 3 times per day  . multivitamin with minerals  1 tablet Oral Daily  . PARoxetine  10 mg Oral Daily  . Warfarin - Pharmacist Dosing Inpatient   Does not apply q1800   Continuous Infusions: . 0.9 % NaCl with KCl 20 mEq / L 100 mL/hr at 09/23/13 0125   PRN Meds:acetaminophen, acetaminophen, ondansetron (ZOFRAN) IV, ondansetron, polyethylene glycol, traMADol-acetaminophen, zolpidem  Assessment/Plan: C DIFF COLITIS:  Seems to be getting better quickly ? PNEUMONIA: on Zithromax only PARKINSONS: doing fair at best. Not much to do different inpatient  DEPRESSION: doing fair, having a tough time with wife's health issue and recent transfer to SNF  HYPERTENSION: BP up some, increase BP  CAD: doing  OK  HX AFIB:in SR on exam and by EKG. On long term coumadin  UNSTABLE GAIT: would like to have him get PT but will let Cdiff settle down first  ADULT FTT: has gone down a lot in the past year or so  HX TIA: stable, on warfarin  ANTICOAGULATION: Sl supratherapeutic, no bleeding noted     LOS: 2 days   Nilda Keathley ALAN 09/23/2013, 8:29 AM

## 2013-09-23 NOTE — Progress Notes (Signed)
Clinical Social Work Department CLINICAL SOCIAL WORK PLACEMENT NOTE 09/23/2013  Patient:  Henry Alvarez, Henry Alvarez  Account Number:  [id number] Admit date:  09/21/2013  Clinical Social Worker:  Renold Genta  Date/time:  09/23/2013 12:00 N  Clinical Social Work is seeking post-discharge placement for this patient at the following level of care:   SKILLED NURSING   (*CSW will update this form in Epic as items are completed)   09/23/2013  Patient/family provided with [facility] Department of Clinical Social Work's list of facilities offering this level of care within the geographic area requested by the patient (or if unable, by the patient's family).  09/23/2013  Patient/family informed of their freedom to choose among providers that offer the needed level of care, that participate in Medicare, Medicaid or managed care program needed by the patient, have an available bed and are willing to accept the patient.  09/23/2013  Patient/family informed of [facility]' ownership interest in [facility], as well as of the fact that they are under no obligation to receive care at this facility.  PASARR submitted to EDS on 09/23/2013 PASARR number received on 09/23/2013  FL2 transmitted to all facilities in geographic area requested by pt/family on  09/23/2013 FL2 transmitted to all facilities within larger geographic area on   Patient informed that his/her managed care company has contracts with or will negotiate with  certain facilities, including the following:     Patient/family informed of bed offers received:   Patient chooses bed at  Physician recommends and patient chooses bed at    Patient to be transferred to  on   Patient to be transferred to facility by  Patient and family notified of transfer on  Name of family member notified:    The following physician request were entered in Epic:   Additional Comments:   [name], [facility] Social Worker cell #: [phone]

## 2013-09-24 LAB — PROTIME-INR
INR: 3.54 — AB (ref 0.00–1.49)
PROTHROMBIN TIME: 35.4 s — AB (ref 11.6–15.2)

## 2013-09-24 NOTE — Progress Notes (Signed)
Patient has a bed at [facility] when stable. Patient & son, [name] aware. CSW will follow-up in the morning.   [name], [facility] Clinical Social Worker cell #: [phone]

## 2013-09-24 NOTE — Progress Notes (Signed)
Physical Therapy Treatment Patient Details Name: Henry Alvarez MRN: [id number] DOB: 1924-12-12 Today's Date: 09/24/2013    History of Present Illness 80yo male admitted8/16 after son Noted that on Friday he began having greatly decreased activity and has had difficulty getting around the house. He has had some confusion as well and is not talkative currently. He has not been eating or drinking as well. He has had loose stools each day as well and is thought to be a bit dehydrated as well.      PT Comments    Progressing; mental status improved from PT eval  Follow Up Recommendations  SNF;Supervision/Assistance - 24 hour     Equipment Recommendations  None recommended by PT    Recommendations for Other Services       Precautions / Restrictions Precautions Precautions: Fall Precaution Comments: incontinent    Mobility  Bed Mobility Overal bed mobility: Needs Assistance Bed Mobility: Supine to Sit     Supine to sit: Supervision     General bed mobility comments: incr time  Transfers Overall transfer level: Needs assistance Equipment used: Rolling walker (2 wheeled) Transfers: Sit to/from Stand Sit to Stand: Min guard         General transfer comment: cues for hand placement, incr timeto  process to turn,back up, sit down  Ambulation/Gait Ambulation/Gait assistance: Min assist Ambulation Distance (Feet): 160 Feet Assistive device: Rolling walker (2 wheeled) Gait Pattern/deviations: Step-through pattern;Decreased stride length;Trunk flexed Gait velocity: slow   General Gait Details: cues for safe use of RW, assist for turning   Stairs            Wheelchair Mobility    Modified Rankin (Stroke Patients Only)       Balance           Standing balance support: Single extremity supported Standing balance-Leahy Scale: Fair                      Cognition Arousal/Alertness: Awake/alert Behavior During Therapy: WFL for tasks  assessed/performed Overall Cognitive Status: Within Functional Limits for tasks assessed         Following Commands: Follows one step commands consistently;Follows multi-step commands with increased time            Exercises General Exercises - Lower Extremity Long Arc Quad: AROM;Strengthening;Both;15 reps;Seated Other Exercises Other Exercises: bil step-ups with bil UE support x 11 reps    General Comments        Pertinent Vitals/Pain Pain Assessment: No/denies pain    Home Living                      Prior Function            PT Goals (current goals can now be found in the care plan section) Acute Rehab PT Goals Patient Stated Goal: to get up and walk PT Goal Formulation: With patient Time For Goal Achievement: 10/06/13 Potential to Achieve Goals: Good Progress towards PT goals: Progressing toward goals    Frequency  Min 3X/week    PT Plan Current plan remains appropriate    Co-evaluation             End of Session Equipment Utilized During Treatment: Gait belt Activity Tolerance: Patient tolerated treatment well Patient left: in chair;with call bell/phone within reach (bed alarm not on)     Time: 2951-8841 PT Time Calculation (min): 31 min  Charges:  $Gait Training: 23-37 mins  G CodesKenyon Ana 09/24/2013, 2:24 PM

## 2013-09-24 NOTE — Progress Notes (Signed)
Subjective: Only one loose stool overnight. Still very weak. Eating OK. No pulm sxs. Some abd soreness. no cardiac sxs. Has been up to the BR but not out of bed otherwise   Objective: Vital signs in last 24 hours: Temp:  [97.4 F (36.3 C)-97.5 F (36.4 C)] 97.5 F (36.4 C) (08/18 2200) Pulse Rate:  [54-59] 59 (08/18 2200) Resp:  [20] 20 (08/18 2200) BP: (124-146)/(45-60) 146/45 mmHg (08/18 2200) SpO2:  [97 %-100 %] 100 % (08/18 2200)  Intake/Output from previous day: 08/18 0701 - 08/19 0700 In: 1670 [P.O.:470; I.V.:1200] Out: -  Intake/Output this shift:    Frail, non toxic, anicteric. Lungs clear ht regular abd less distended. Better BS's. Awake. mentating well. Very weak globally  Lab Results   Recent Labs  09/22/13 0059 09/23/13 0524  WBC 12.3* 10.5  RBC 3.69*  3.69* 3.47*  HGB 11.2* 10.7*  HCT 34.2* 32.1*  MCV 92.7 92.5  MCH 30.4 30.8  RDW 14.0 14.0  PLT 102* 106*    Recent Labs  09/22/13 0059 09/23/13 0524  NA 137 140  K 3.8 4.1  CL 101 107  CO2 24 22  GLUCOSE 105* 93  BUN 18 12  CREATININE 1.01 0.97  CALCIUM 8.3* 8.2*    Studies/Results: Dg Chest 2 View  09/22/2013   CLINICAL DATA:  weakness, altered mental status  EXAM: CHEST - 2 VIEW  COMPARISON:  09/21/2013  FINDINGS: Mild cardiomegaly without CHF. Aortic is ectatic and atherosclerotic with slight tortuosity. Low lung volumes. Lungs remain clear. No CHF, collapse or consolidation. No edema, effusion or pneumothorax. Prior lower thoracic and lumbar vertebral augmentations noted. Overall stable exam.  IMPRESSION: Stable chronic findings.  No superimposed acute process.   Electronically Signed   By: Daryll Brod M.D.   On: 09/22/2013 09:22    Scheduled Meds: . amLODipine  5 mg Oral Daily  . azithromycin  250 mg Oral QHS  . carbidopa-levodopa  1 tablet Oral 4 times per day  . feeding supplement (ENSURE COMPLETE)  237 mL Oral TID BM  . metroNIDAZOLE  500 mg Oral 3 times per day  . multivitamin  with minerals  1 tablet Oral Daily  . PARoxetine  10 mg Oral Daily  . Warfarin - Pharmacist Dosing Inpatient   Does not apply q1800   Continuous Infusions: . 0.9 % NaCl with KCl 20 mEq / L 100 mL/hr at 09/23/13 1836   PRN Meds:acetaminophen, acetaminophen, ondansetron (ZOFRAN) IV, ondansetron, polyethylene glycol, traMADol-acetaminophen, zolpidem  Assessment/Plan:  C DIFF COLITIS: continues to improve, WBC 10.5 ? PNEUMONIA: on Zithromax only ,nearly finished PARKINSONS: doing fair at best. Not much to do different inpatient  DEPRESSION: doing fair, having a tough time with wife's health issue and recent transfer to SNF  HYPERTENSION: better CAD: doing OK  HX AFIB:in SR on exam and by EKG. On long term coumadin  UNSTABLE GAIT: PT consult today ANEMIA: mild at 10./7 ADULT FTT: has gone down a lot in the past year or so  HX TIA: stable, on warfarin  ANTICOAGULATION: Sl supratherapeuti at 3.54, no bleeding noted DISP: FL-2 completed. To SNF when stable    LOS: 3 days   Lamia Mariner ALAN 09/24/2013, 7:16 AM

## 2013-09-24 NOTE — Progress Notes (Signed)
ANTICOAGULATION CONSULT NOTE - Follow Up  Pharmacy Consult for Warfarin Indication: atrial fibrillation  No Known Allergies  Patient Measurements: TBW: 66.4kg  Vital Signs: Temp: 97.7 F (36.5 C) (08/19 0700) Temp src: Oral (08/19 0700) BP: 167/77 mmHg (08/19 0700) Pulse Rate: 54 (08/19 0700)  Labs:  Recent Labs  09/21/13 2116 09/22/13 0059 09/22/13 0100 09/23/13 0524 09/24/13 0510  HGB 9.0* 11.2*  --  10.7*  --   HCT 27.4* 34.2*  --  32.1*  --   PLT 130* 102*  --  106*  --   LABPROT  --   --  31.2* 35.6* 35.4*  INR  --   --  3.01* 3.56* 3.54*  CREATININE 1.04 1.01  --  0.97  --    Estimated Creatinine Clearance: 51.1 ml/min (by C-G formula based on Cr of 0.97).  Medications:  Scheduled:  . amLODipine  5 mg Oral Daily  . azithromycin  250 mg Oral QHS  . carbidopa-levodopa  1 tablet Oral 4 times per day  . feeding supplement (ENSURE COMPLETE)  237 mL Oral TID BM  . metroNIDAZOLE  500 mg Oral 3 times per day  . multivitamin with minerals  1 tablet Oral Daily  . PARoxetine  10 mg Oral Daily  . Warfarin - Pharmacist Dosing Inpatient   Does not apply q1800   Assessment: 32 yoM with h/o AFib, CAD and parkinson's with complaint of weakness x 2 days. Chest CT with possible pneumonia treated with Rocephin x1 & Azithromycin po. Stools positive for CDiff  PTA on warfarin 9mg  daily - last dose taken 8/16 @ 1pm  INR on admit was 3.01, Warfarin 9mg  given 8/17 pm  Metronidazole begun 8/17, will increase INR  INR continues to be supratherapeutic due to concurrent Flagyl  No reported bleeding per notes  CBC today low, stable  Goal of Therapy:  INR 2-3   Plan:   No Warfarin today  Daily Protime/INR   Adrian Saran, PharmD, BCPS Pager [phone] 09/24/2013 8:41 AM

## 2013-09-25 DIAGNOSIS — M1710 Unilateral primary osteoarthritis, unspecified knee: Secondary | ICD-10-CM | POA: Diagnosis not present

## 2013-09-25 DIAGNOSIS — R0989 Other specified symptoms and signs involving the circulatory and respiratory systems: Secondary | ICD-10-CM | POA: Diagnosis not present

## 2013-09-25 DIAGNOSIS — R0602 Shortness of breath: Secondary | ICD-10-CM | POA: Diagnosis not present

## 2013-09-25 DIAGNOSIS — A044 Other intestinal Escherichia coli infections: Secondary | ICD-10-CM | POA: Diagnosis not present

## 2013-09-25 DIAGNOSIS — G20 Parkinson's disease: Secondary | ICD-10-CM | POA: Diagnosis not present

## 2013-09-25 DIAGNOSIS — I480 Paroxysmal atrial fibrillation: Secondary | ICD-10-CM | POA: Diagnosis not present

## 2013-09-25 DIAGNOSIS — M6281 Muscle weakness (generalized): Secondary | ICD-10-CM | POA: Diagnosis not present

## 2013-09-25 DIAGNOSIS — E785 Hyperlipidemia, unspecified: Secondary | ICD-10-CM | POA: Diagnosis not present

## 2013-09-25 DIAGNOSIS — I2510 Atherosclerotic heart disease of native coronary artery without angina pectoris: Secondary | ICD-10-CM | POA: Diagnosis not present

## 2013-09-25 DIAGNOSIS — I10 Essential (primary) hypertension: Secondary | ICD-10-CM | POA: Diagnosis not present

## 2013-09-25 DIAGNOSIS — F3289 Other specified depressive episodes: Secondary | ICD-10-CM | POA: Diagnosis not present

## 2013-09-25 DIAGNOSIS — M159 Polyosteoarthritis, unspecified: Secondary | ICD-10-CM | POA: Diagnosis not present

## 2013-09-25 DIAGNOSIS — E46 Unspecified protein-calorie malnutrition: Secondary | ICD-10-CM | POA: Diagnosis not present

## 2013-09-25 DIAGNOSIS — Z7901 Long term (current) use of anticoagulants: Secondary | ICD-10-CM | POA: Diagnosis not present

## 2013-09-25 DIAGNOSIS — M150 Primary generalized (osteo)arthritis: Secondary | ICD-10-CM | POA: Diagnosis not present

## 2013-09-25 DIAGNOSIS — I4891 Unspecified atrial fibrillation: Secondary | ICD-10-CM | POA: Diagnosis not present

## 2013-09-25 DIAGNOSIS — R269 Unspecified abnormalities of gait and mobility: Secondary | ICD-10-CM | POA: Diagnosis not present

## 2013-09-25 DIAGNOSIS — J189 Pneumonia, unspecified organism: Secondary | ICD-10-CM | POA: Diagnosis not present

## 2013-09-25 DIAGNOSIS — F329 Major depressive disorder, single episode, unspecified: Secondary | ICD-10-CM | POA: Diagnosis not present

## 2013-09-25 DIAGNOSIS — B999 Unspecified infectious disease: Secondary | ICD-10-CM | POA: Diagnosis not present

## 2013-09-25 DIAGNOSIS — D649 Anemia, unspecified: Secondary | ICD-10-CM | POA: Diagnosis not present

## 2013-09-25 DIAGNOSIS — A0472 Enterocolitis due to Clostridium difficile, not specified as recurrent: Secondary | ICD-10-CM | POA: Diagnosis not present

## 2013-09-25 DIAGNOSIS — R05 Cough: Secondary | ICD-10-CM | POA: Diagnosis not present

## 2013-09-25 DIAGNOSIS — R627 Adult failure to thrive: Secondary | ICD-10-CM | POA: Diagnosis not present

## 2013-09-25 DIAGNOSIS — L84 Corns and callosities: Secondary | ICD-10-CM | POA: Diagnosis not present

## 2013-09-25 DIAGNOSIS — R059 Cough, unspecified: Secondary | ICD-10-CM | POA: Diagnosis not present

## 2013-09-25 LAB — PROTIME-INR
INR: 2.86 — ABNORMAL HIGH (ref 0.00–1.49)
PROTHROMBIN TIME: 30 s — AB (ref 11.6–15.2)

## 2013-09-25 MED ORDER — WARFARIN SODIUM 5 MG PO TABS
5.0000 mg | ORAL_TABLET | Freq: Once | ORAL | Status: DC
  Filled 2013-09-25: qty 1

## 2013-09-25 MED ORDER — METRONIDAZOLE 500 MG PO TABS: 500.0000 mg | ORAL_TABLET | Freq: Three times a day (TID) | ORAL | Status: AC

## 2013-09-25 NOTE — Discharge Summary (Signed)
DISCHARGE SUMMARY  Henry Alvarez  MR#: [id number]  DOB:1924-02-20  Date of Admission: 09/21/2013 Date of Discharge: 09/25/2013  Attending Physician:Ellesse Antenucci ALAN  Patient's QMG:NOIBB,CWUGQBV Henry Haste, MD  Consults:  none  Discharge Diagnoses: Principal Problem:   Enteritis due to Clostridium difficile, clinically improved. To finish out 7 more days of metronidazole Active Problems:   Coronary artery disease, stable   Hypertension, improved   Hyperlipidemia, on medications   Long-term (current) use of anticoagulants, with INR in therapeutic range today   OA (osteoarthritis) of knee, stable   Parkinson's disease, with gait disorder and risk for falls   Pneumonia, has finished antibiotics   Unstable gait, to get physical therapy   FTT (failure to thrive) in adult   Depression, on medications Anemia, mild Protein calorie malnutrition, moderate   Discharge Medications:   Medication List         amLODipine 2.5 MG tablet  Commonly known as:  NORVASC  Take 2.5 mg by mouth every morning.     carbidopa-levodopa 25-100 MG per tablet  Commonly known as:  SINEMET IR  Take 1 tablet by mouth 4 (four) times daily. Take 1.tabs at 18am, 1tab at 12-1pm, 1tab at 5pm and 1tab at 9pm     Melatonin 3 MG Tabs  Take 3-6 mg by mouth at bedtime.     metroNIDAZOLE 500 MG tablet  Commonly known as:  FLAGYL  Take 1 tablet (500 mg total) by mouth every 8 (eight) hours.     multivitamin with minerals Tabs tablet  Take 1 tablet by mouth daily.     PARoxetine 10 MG tablet  Commonly known as:  PAXIL  Take 0.5 tablets (5 mg total) by mouth daily.     traMADol-acetaminophen 37.5-325 MG per tablet  Commonly known as:  ULTRACET  Take 1 tablet by mouth every 6 (six) hours as needed for pain.     warfarin 6 MG tablet  Commonly known as:  COUMADIN  Take 9 mg by mouth daily.        Hospital Procedures: Dg Chest 2 View  09/22/2013   CLINICAL DATA:  weakness, altered mental status   EXAM: CHEST - 2 VIEW  COMPARISON:  09/21/2013  FINDINGS: Mild cardiomegaly without CHF. Aortic is ectatic and atherosclerotic with slight tortuosity. Low lung volumes. Lungs remain clear. No CHF, collapse or consolidation. No edema, effusion or pneumothorax. Prior lower thoracic and lumbar vertebral augmentations noted. Overall stable exam.  IMPRESSION: Stable chronic findings.  No superimposed acute process.   Electronically Signed   By: Daryll Brod M.D.   On: 09/22/2013 09:22   Dg Chest Port 1 View  09/21/2013   CLINICAL DATA:  Weakness and fever.  EXAM: PORTABLE CHEST - 1 VIEW  COMPARISON:  Chest radiograph September 05, 2012  FINDINGS: Cardiac silhouette appears mildly enlarged. Tortuous calcified aorta. Slightly increased interstitial prominence without pleural effusions or focal consolidations. No pneumothorax.  Osteopenia. Remote right posterior rib fractures. Multilevel thoracolumbar vertebral body cement augmentation. High-riding right shoulder suggest remote rotator cuff injury. Multiple EKG lines overlie the patient and may obscure subtle underlying pathology.  IMPRESSION: Increasing interstitial prominence could reflect atypical infection or less likely pulmonary edema without focal consolidation.  Mild cardiomegaly.   Electronically Signed   By: Elon Alas   On: 09/21/2013 23:15    History of Present Illness: Fever and cough  Hospital Course: This is an 43 she'll white male admitted by my partner for fever and what appeared to be an early  pneumonia. Subsequently the x-ray was less remarkable but he finished out antibiotics. He did however have a positive Clostridium difficile titer which became his primary issue while here. Fortunately this is resolved much quicker than usual. He's had no severe leukocytosis and no clinical toxicity. His electrolytes are done well. His bowels have settled down nicely and is going to finish out a week of therapy for this. He's been a general failure to  thrive mode for the last year to 2. His Parkinson's disease has caused him to slow down quite a bit. He is very weak and is now going to skilled care. His wife recently was admitted to the same skilled care. It is likely that they will both remain in long-term care facilities rather than returning home. He did receive some Rocephin followed by azithromycin and has finished his course of biotics. His appetite is improving a bit. He has some knee pain. He has some mild abdominal pain at times but this is improving. He's had no nausea and vomiting. His respiratory status is been stable.  Day of Discharge Exam BP 171/73  Pulse 55  Temp(Src) 97.8 F (36.6 C) (Oral)  Resp 18  Ht 5\' 7"  (1.702 m)  Wt 66.407 kg (146 lb 6.4 oz)  BMI 22.92 kg/m2  SpO2 98%  Physical Exam: General appearance: Frail nontoxic white male sitting up in no distress face is symmetric sclera anicteric no nystagmus is present oral mucous members are moist. Dentition is in fair repair. Neck is supple without bruits.   Resp: Clear with no wheezes rales or rhonchi. No excess her muscles are in use. Cardio: Regular with rare skips. GI: soft, minimal lower quadrant tenderness; bowel sounds normal; no masses,  no organomegaly Extremities: no clubbing, cyanosis or edema, pulses intact Neuro: He is globally weak. His speech is clear and fluent. He has a slight resting tremor. His mentation is intact. He has no significant dysphoria.  Discharge Labs:  Recent Labs  09/23/13 0524  NA 140  K 4.1  CL 107  CO2 22  GLUCOSE 93  BUN 12  CREATININE 0.97  CALCIUM 8.2*    Recent Labs  09/23/13 0524  AST 18  ALT <5  ALKPHOS 74  BILITOT 0.6  PROT 5.6*  ALBUMIN 2.6*    Recent Labs  09/23/13 0524  WBC 10.5  HGB 10.7*  HCT 32.1*  MCV 92.5  PLT 106*   Lab Results  Component Value Date   INR 2.86* 09/25/2013   INR 3.54* 09/24/2013   INR 3.56* 09/23/2013       Discharge instructions: He'll need PT INR followed We'll  be getting physical and occupational therapy   Disposition: To SNF under my care at [facility]: Follow-up with with me a Ritta Slot  Condition on Discharge: Improved  Tests Needing Follow-up: None  Signed: Sheela Stack 09/25/2013, 12:04 PM

## 2013-09-25 NOTE — Progress Notes (Signed)
Report called to Anthonnet, nurse at the SNF. Discharge instructions accompanied pt, left the unit in stable condition, transported to SNF via ambulance.

## 2013-09-25 NOTE — Progress Notes (Signed)
Patient is set discharge to [facility] today. Patient & son, [name] aware. Discharge packet in [place], [employer] aware. [facility] called for transport.   Clinical Social Work Department CLINICAL SOCIAL WORK PLACEMENT NOTE 09/25/2013  Patient:  Henry Alvarez, Henry Alvarez  Account Number:  [id number] Admit date:  09/21/2013  Clinical Social Worker:  Renold Genta  Date/time:  09/23/2013 12:00 N  Clinical Social Work is seeking post-discharge placement for this patient at the following level of care:   SKILLED NURSING   (*CSW will update this form in Epic as items are completed)   09/23/2013  Patient/family provided with [place] Department of Clinical Social Work's list of facilities offering this level of care within the geographic area requested by the patient (or if unable, by the patient's family).  09/23/2013  Patient/family informed of their freedom to choose among providers that offer the needed level of care, that participate in Medicare, Medicaid or managed care program needed by the patient, have an available bed and are willing to accept the patient.  09/23/2013  Patient/family informed of [facility]' ownership interest in [facility], as well as of the fact that they are under no obligation to receive care at this facility.  PASARR submitted to EDS on 09/23/2013 PASARR number received on 09/23/2013  FL2 transmitted to all facilities in geographic area requested by pt/family on  09/23/2013 FL2 transmitted to all facilities within larger geographic area on   Patient informed that his/her managed care company has contracts with or will negotiate with  certain facilities, including the following:     Patient/family informed of bed offers received:  09/25/2013 Patient chooses bed at [place] Physician recommends and patient chooses bed at    Patient to be transferred to [facility] on  09/25/2013 Patient to be  transferred to facility by [facility] Patient and family notified of transfer on 09/25/2013 Name of family member notified:  patient's son, [name] via phone  The following physician request were entered in Epic:   Additional Comments:   [name], [place] Social Worker cell #: [phone]

## 2013-09-25 NOTE — Progress Notes (Signed)
Physical Therapy Treatment Patient Details Name: Henry Alvarez MRN: [id number] DOB: 14-May-1924 Today's Date: 09/25/2013    History of Present Illness 80yo male admitted8/16 after son Noted that on Friday he began having greatly decreased activity and has had difficulty getting around the house. He has had some confusion as well and is not talkative currently. He has not been eating or drinking as well. He has had loose stools each day as well and is thought to be a bit dehydrated as well.      PT Comments    Assisted pt OOb to amb in hallway then assited to BR.  Positioned in recliner.  Follow Up Recommendations  SNF ([facility])     Equipment Recommendations       Recommendations for Other Services       Precautions / Restrictions Precautions Precautions: Fall Precaution Comments: incontinent/wears briefs    Mobility  Bed Mobility Overal bed mobility: Needs Assistance Bed Mobility: Supine to Sit     Supine to sit: Supervision     General bed mobility comments: increased time  Transfers Overall transfer level: Needs assistance Equipment used: Rolling walker (2 wheeled) Transfers: Sit to/from Stand Sit to Stand: Min guard         General transfer comment: cues for hand placement, incr timeto  process to turn,back up, sit down  Ambulation/Gait Ambulation/Gait assistance: Min assist Ambulation Distance (Feet): 175 Feet Assistive device: Rolling walker (2 wheeled) Gait Pattern/deviations: Step-through pattern;Trunk flexed Gait velocity: slow   General Gait Details: cues for safe use of RW, assist for turning   Stairs            Wheelchair Mobility    Modified Rankin (Stroke Patients Only)       Balance                                    Cognition                            Exercises      General Comments        Pertinent Vitals/Pain      Home Living                      Prior Function            PT Goals (current goals can now be found in the care plan section) Progress towards PT goals: Progressing toward goals    Frequency  Min 3X/week    PT Plan      Co-evaluation             End of Session Equipment Utilized During Treatment: Gait belt Activity Tolerance: Patient tolerated treatment well Patient left: in chair;with call bell/phone within reach     Time: 1035-1109 PT Time Calculation (min): 34 min  Charges:  $Gait Training: 8-22 mins $Therapeutic Activity: 8-22 mins                    G Codes:      Rica Koyanagi  PTA [facility]  Acute  Rehab Pager      [phone]

## 2013-09-25 NOTE — Progress Notes (Signed)
ANTICOAGULATION CONSULT NOTE - Follow Up  Pharmacy Consult for Warfarin Indication: atrial fibrillation, hx TIAs  No Known Allergies  Patient Measurements: TBW: 66.4kg on 8/17  Vital Signs: Temp: 97.8 F (36.6 C) (08/20 0512) Temp src: Oral (08/20 0512) BP: 171/73 mmHg (08/20 0512) Pulse Rate: 55 (08/20 0512)  Labs:  Recent Labs  09/23/13 0524 09/24/13 0510 09/25/13 0515  HGB 10.7*  --   --   HCT 32.1*  --   --   PLT 106*  --   --   LABPROT 35.6* 35.4* 30.0*  INR 3.56* 3.54* 2.86*  CREATININE 0.97  --   --    Estimated Creatinine Clearance: 51.1 ml/min (by C-G formula based on Cr of 0.97).  Medications:  Scheduled:  . amLODipine  5 mg Oral Daily  . azithromycin  250 mg Oral QHS  . carbidopa-levodopa  1 tablet Oral 4 times per day  . feeding supplement (ENSURE COMPLETE)  237 mL Oral TID BM  . metroNIDAZOLE  500 mg Oral 3 times per day  . multivitamin with minerals  1 tablet Oral Daily  . PARoxetine  10 mg Oral Daily  . Warfarin - Pharmacist Dosing Inpatient   Does not apply q1800   Inpatient warfarin doses administered this admission: 8/17: 9 mg 8/18: 0 8/19: 0   Assessment: 51 yoM with h/o AFib, TIAs, CAD and Parkinson's disease was admitted with complaint of weakness x 2 days. Chest CT showed possible pneumonia, which was treated with Rocephin x1 & azithromycin po. Frequent stools were noted and stool was positive for C Diff. Metronidazole was started 8/17 with 14-day course planned.  PTA warfarin dosage was 9mg  daily,  last dose taken 8/16 @ 1pm INR on admit was 3.01.  Goal of Therapy:  INR 2-3   8/20:  INR now back within therapeutic range after holding warfarin x 2 days.  Concomitant metronidazole noted - anticipate warfarin dosage requirements will remain lower than usual for patient while on this antibiotic.  Reportedly tolerating regular diet  No bleeding reported.     Plan:   Resume warfarin today with smaller dose:  5 mg x 1.  Will  need close INR follow-up and warfarin titration while on metronidazole and immediately afterward.  Daily Protime/INR while inpatient.  Clayburn Pert, PharmD, BCPS Pager: [phone] 09/25/2013  8:01 AM

## 2013-09-26 DIAGNOSIS — J189 Pneumonia, unspecified organism: Secondary | ICD-10-CM | POA: Diagnosis not present

## 2013-09-26 DIAGNOSIS — F329 Major depressive disorder, single episode, unspecified: Secondary | ICD-10-CM | POA: Diagnosis not present

## 2013-09-26 DIAGNOSIS — R627 Adult failure to thrive: Secondary | ICD-10-CM | POA: Diagnosis not present

## 2013-09-26 DIAGNOSIS — R269 Unspecified abnormalities of gait and mobility: Secondary | ICD-10-CM | POA: Diagnosis not present

## 2013-09-26 DIAGNOSIS — F3289 Other specified depressive episodes: Secondary | ICD-10-CM | POA: Diagnosis not present

## 2013-09-26 DIAGNOSIS — G20 Parkinson's disease: Secondary | ICD-10-CM | POA: Diagnosis not present

## 2013-09-26 DIAGNOSIS — A0472 Enterocolitis due to Clostridium difficile, not specified as recurrent: Secondary | ICD-10-CM | POA: Diagnosis not present

## 2013-09-28 LAB — CULTURE, BLOOD (ROUTINE X 2)
CULTURE: NO GROWTH
Culture: NO GROWTH

## 2013-09-30 DIAGNOSIS — R627 Adult failure to thrive: Secondary | ICD-10-CM | POA: Diagnosis not present

## 2013-09-30 DIAGNOSIS — I2510 Atherosclerotic heart disease of native coronary artery without angina pectoris: Secondary | ICD-10-CM | POA: Diagnosis not present

## 2013-09-30 DIAGNOSIS — I10 Essential (primary) hypertension: Secondary | ICD-10-CM | POA: Diagnosis not present

## 2013-09-30 DIAGNOSIS — A0472 Enterocolitis due to Clostridium difficile, not specified as recurrent: Secondary | ICD-10-CM | POA: Diagnosis not present

## 2013-09-30 DIAGNOSIS — Z7901 Long term (current) use of anticoagulants: Secondary | ICD-10-CM | POA: Diagnosis not present

## 2013-09-30 DIAGNOSIS — G20 Parkinson's disease: Secondary | ICD-10-CM | POA: Diagnosis not present

## 2013-09-30 DIAGNOSIS — E46 Unspecified protein-calorie malnutrition: Secondary | ICD-10-CM | POA: Diagnosis not present

## 2013-09-30 DIAGNOSIS — E785 Hyperlipidemia, unspecified: Secondary | ICD-10-CM | POA: Diagnosis not present

## 2013-10-20 DIAGNOSIS — I4891 Unspecified atrial fibrillation: Secondary | ICD-10-CM | POA: Diagnosis not present

## 2013-10-20 DIAGNOSIS — J189 Pneumonia, unspecified organism: Secondary | ICD-10-CM | POA: Diagnosis not present

## 2013-10-20 DIAGNOSIS — Z7901 Long term (current) use of anticoagulants: Secondary | ICD-10-CM | POA: Diagnosis not present

## 2013-10-23 DIAGNOSIS — I4891 Unspecified atrial fibrillation: Secondary | ICD-10-CM | POA: Diagnosis not present

## 2013-10-23 DIAGNOSIS — R059 Cough, unspecified: Secondary | ICD-10-CM | POA: Diagnosis not present

## 2013-10-23 DIAGNOSIS — J189 Pneumonia, unspecified organism: Secondary | ICD-10-CM | POA: Diagnosis not present

## 2013-10-23 DIAGNOSIS — R05 Cough: Secondary | ICD-10-CM | POA: Diagnosis not present

## 2013-10-28 ENCOUNTER — Ambulatory Visit: Payer: Medicare Other | Admitting: Occupational Therapy

## 2013-10-28 ENCOUNTER — Ambulatory Visit: Payer: Medicare Other | Admitting: Speech Pathology

## 2013-10-30 DIAGNOSIS — I4891 Unspecified atrial fibrillation: Secondary | ICD-10-CM | POA: Diagnosis not present

## 2013-10-30 DIAGNOSIS — Z7901 Long term (current) use of anticoagulants: Secondary | ICD-10-CM | POA: Diagnosis not present

## 2013-11-05 DIAGNOSIS — I4891 Unspecified atrial fibrillation: Secondary | ICD-10-CM | POA: Diagnosis not present

## 2013-11-05 DIAGNOSIS — Z7901 Long term (current) use of anticoagulants: Secondary | ICD-10-CM | POA: Diagnosis not present

## 2013-11-05 DIAGNOSIS — L84 Corns and callosities: Secondary | ICD-10-CM | POA: Diagnosis not present

## 2013-11-10 DIAGNOSIS — Z7901 Long term (current) use of anticoagulants: Secondary | ICD-10-CM | POA: Diagnosis not present

## 2013-11-10 DIAGNOSIS — I480 Paroxysmal atrial fibrillation: Secondary | ICD-10-CM | POA: Diagnosis not present

## 2013-11-17 ENCOUNTER — Ambulatory Visit: Payer: Medicare Other | Admitting: Podiatry

## 2013-11-19 DIAGNOSIS — Z7901 Long term (current) use of anticoagulants: Secondary | ICD-10-CM | POA: Diagnosis not present

## 2013-11-19 DIAGNOSIS — I480 Paroxysmal atrial fibrillation: Secondary | ICD-10-CM | POA: Diagnosis not present

## 2013-12-18 ENCOUNTER — Telehealth: Payer: Self-pay | Admitting: Neurology

## 2013-12-18 NOTE — Telephone Encounter (Signed)
Pt resch his appt from 12-22-13 to 01-05-14 due to another dr appt on Monday that he was worked in on

## 2013-12-22 ENCOUNTER — Ambulatory Visit: Payer: Medicare Other | Admitting: Neurology

## 2013-12-22 DIAGNOSIS — Z7901 Long term (current) use of anticoagulants: Secondary | ICD-10-CM | POA: Diagnosis not present

## 2013-12-22 DIAGNOSIS — I10 Essential (primary) hypertension: Secondary | ICD-10-CM | POA: Diagnosis not present

## 2013-12-22 DIAGNOSIS — I2510 Atherosclerotic heart disease of native coronary artery without angina pectoris: Secondary | ICD-10-CM | POA: Diagnosis not present

## 2013-12-22 DIAGNOSIS — Z23 Encounter for immunization: Secondary | ICD-10-CM | POA: Diagnosis not present

## 2013-12-22 DIAGNOSIS — D696 Thrombocytopenia, unspecified: Secondary | ICD-10-CM | POA: Diagnosis not present

## 2013-12-22 DIAGNOSIS — I480 Paroxysmal atrial fibrillation: Secondary | ICD-10-CM | POA: Diagnosis not present

## 2013-12-22 DIAGNOSIS — G20 Parkinson's disease: Secondary | ICD-10-CM | POA: Diagnosis not present

## 2013-12-22 DIAGNOSIS — L84 Corns and callosities: Secondary | ICD-10-CM | POA: Diagnosis not present

## 2014-01-05 ENCOUNTER — Ambulatory Visit (INDEPENDENT_AMBULATORY_CARE_PROVIDER_SITE_OTHER): Payer: Medicare Other | Admitting: Neurology

## 2014-01-05 ENCOUNTER — Encounter: Payer: Self-pay | Admitting: Neurology

## 2014-01-05 VITALS — BP 128/74 | HR 57 | Ht 66.0 in | Wt 156.0 lb

## 2014-01-05 DIAGNOSIS — F329 Major depressive disorder, single episode, unspecified: Secondary | ICD-10-CM | POA: Diagnosis not present

## 2014-01-05 DIAGNOSIS — G20 Parkinson's disease: Secondary | ICD-10-CM | POA: Diagnosis not present

## 2014-01-05 DIAGNOSIS — F32A Depression, unspecified: Secondary | ICD-10-CM

## 2014-01-05 NOTE — Patient Instructions (Signed)
1.  Continue Sinemet 1 tablet at 8am, 1 tablet between 12 and 1pm, 1 tablet at 5pm and 1 tablet at 9pm 2.  Limit driving to only 3-5 mile radius and only during daylight hours 3.  We will refer you for a swallow evaluation 4.  Continue Paxil 5.  Follow up in 4 months.
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
## 2014-01-08 ENCOUNTER — Other Ambulatory Visit: Payer: Self-pay | Admitting: *Deleted

## 2014-01-08 DIAGNOSIS — G20 Parkinson's disease: Secondary | ICD-10-CM

## 2014-01-08 DIAGNOSIS — I480 Paroxysmal atrial fibrillation: Secondary | ICD-10-CM | POA: Diagnosis not present

## 2014-01-08 DIAGNOSIS — Z7901 Long term (current) use of anticoagulants: Secondary | ICD-10-CM | POA: Diagnosis not present

## 2014-01-21 ENCOUNTER — Other Ambulatory Visit: Payer: Self-pay | Admitting: *Deleted

## 2014-01-21 ENCOUNTER — Telehealth: Payer: Self-pay | Admitting: *Deleted

## 2014-01-21 DIAGNOSIS — G20 Parkinson's disease: Secondary | ICD-10-CM | POA: Diagnosis not present

## 2014-01-21 DIAGNOSIS — E785 Hyperlipidemia, unspecified: Secondary | ICD-10-CM | POA: Diagnosis not present

## 2014-01-21 DIAGNOSIS — I480 Paroxysmal atrial fibrillation: Secondary | ICD-10-CM | POA: Diagnosis not present

## 2014-01-21 DIAGNOSIS — R740 Nonspecific elevation of levels of transaminase and lactic acid dehydrogenase [LDH]: Secondary | ICD-10-CM | POA: Diagnosis not present

## 2014-01-21 DIAGNOSIS — I2510 Atherosclerotic heart disease of native coronary artery without angina pectoris: Secondary | ICD-10-CM | POA: Diagnosis not present

## 2014-01-21 DIAGNOSIS — Z1389 Encounter for screening for other disorder: Secondary | ICD-10-CM | POA: Diagnosis not present

## 2014-01-21 DIAGNOSIS — R634 Abnormal weight loss: Secondary | ICD-10-CM | POA: Diagnosis not present

## 2014-01-21 DIAGNOSIS — D696 Thrombocytopenia, unspecified: Secondary | ICD-10-CM | POA: Diagnosis not present

## 2014-01-21 DIAGNOSIS — J383 Other diseases of vocal cords: Secondary | ICD-10-CM | POA: Diagnosis not present

## 2014-01-21 DIAGNOSIS — M859 Disorder of bone density and structure, unspecified: Secondary | ICD-10-CM | POA: Diagnosis not present

## 2014-01-21 NOTE — Telephone Encounter (Signed)
Patient is aware of speech evuation on 01/28/14 at 11:15am

## 2014-01-28 ENCOUNTER — Other Ambulatory Visit (HOSPITAL_COMMUNITY): Payer: Medicare Other

## 2014-01-28 ENCOUNTER — Ambulatory Visit (HOSPITAL_COMMUNITY): Admission: RE | Admit: 2014-01-28 | Payer: Medicare Other | Source: Ambulatory Visit

## 2014-01-28 ENCOUNTER — Ambulatory Visit (HOSPITAL_COMMUNITY): Payer: Medicare Other

## 2014-02-16 ENCOUNTER — Telehealth: Payer: Self-pay | Admitting: Neurology

## 2014-02-16 NOTE — Telephone Encounter (Signed)
[name], pt's son called wanting to speak to a nurse to see if Dr. Tomi Likens can refer pt to a speech/swallowing therapist. Please call [name] # [phone]

## 2014-02-18 ENCOUNTER — Telehealth: Payer: Self-pay | Admitting: *Deleted

## 2014-02-18 DIAGNOSIS — Z6825 Body mass index (BMI) 25.0-25.9, adult: Secondary | ICD-10-CM | POA: Diagnosis not present

## 2014-02-18 DIAGNOSIS — L84 Corns and callosities: Secondary | ICD-10-CM | POA: Diagnosis not present

## 2014-02-18 DIAGNOSIS — G20 Parkinson's disease: Secondary | ICD-10-CM | POA: Diagnosis not present

## 2014-02-18 DIAGNOSIS — D649 Anemia, unspecified: Secondary | ICD-10-CM | POA: Diagnosis not present

## 2014-02-18 DIAGNOSIS — I10 Essential (primary) hypertension: Secondary | ICD-10-CM | POA: Diagnosis not present

## 2014-02-18 DIAGNOSIS — I480 Paroxysmal atrial fibrillation: Secondary | ICD-10-CM | POA: Diagnosis not present

## 2014-02-18 DIAGNOSIS — M810 Age-related osteoporosis without current pathological fracture: Secondary | ICD-10-CM | POA: Diagnosis not present

## 2014-02-18 DIAGNOSIS — E785 Hyperlipidemia, unspecified: Secondary | ICD-10-CM | POA: Diagnosis not present

## 2014-02-18 DIAGNOSIS — D696 Thrombocytopenia, unspecified: Secondary | ICD-10-CM | POA: Diagnosis not present

## 2014-02-18 DIAGNOSIS — I2510 Atherosclerotic heart disease of native coronary artery without angina pectoris: Secondary | ICD-10-CM | POA: Diagnosis not present

## 2014-02-18 NOTE — Telephone Encounter (Signed)
Son is aware that the patient has an appt for a TEEon 03/02/14 at11 am his booking number is [id number] I scheduled this with Olegario Shearer at [facility]  no instuctions were given

## 2014-02-25 DIAGNOSIS — H40013 Open angle with borderline findings, low risk, bilateral: Secondary | ICD-10-CM | POA: Diagnosis not present

## 2014-02-25 DIAGNOSIS — H04123 Dry eye syndrome of bilateral lacrimal glands: Secondary | ICD-10-CM | POA: Diagnosis not present

## 2014-02-27 ENCOUNTER — Ambulatory Visit: Payer: Medicare Other | Admitting: Podiatry

## 2014-03-02 ENCOUNTER — Ambulatory Visit (HOSPITAL_COMMUNITY): Admission: RE | Admit: 2014-03-02 | Payer: Medicare Other | Source: Ambulatory Visit | Admitting: Cardiovascular Disease

## 2014-03-02 ENCOUNTER — Ambulatory Visit (HOSPITAL_COMMUNITY)
Admission: RE | Admit: 2014-03-02 | Discharge: 2014-03-02 | Disposition: A | Discharge status: Home / Self Care | Payer: Medicare Other | Source: Ambulatory Visit | Attending: Cardiology | Admitting: Cardiology

## 2014-03-02 ENCOUNTER — Encounter (HOSPITAL_COMMUNITY)
Admission: RE | Disposition: A | Discharge status: Home / Self Care | Payer: Self-pay | Source: Ambulatory Visit | Attending: Cardiology

## 2014-03-02 ENCOUNTER — Encounter (HOSPITAL_COMMUNITY): Admission: RE | Payer: Self-pay | Source: Ambulatory Visit

## 2014-03-02 SURGERY — ECHOCARDIOGRAM, TRANSESOPHAGEAL
Anesthesia: Moderate Sedation

## 2014-03-04 DIAGNOSIS — I480 Paroxysmal atrial fibrillation: Secondary | ICD-10-CM | POA: Diagnosis not present

## 2014-03-04 DIAGNOSIS — Z7901 Long term (current) use of anticoagulants: Secondary | ICD-10-CM | POA: Diagnosis not present

## 2014-03-09 DIAGNOSIS — H4011X3 Primary open-angle glaucoma, severe stage: Secondary | ICD-10-CM | POA: Diagnosis not present

## 2014-03-11 ENCOUNTER — Other Ambulatory Visit: Payer: Self-pay

## 2014-03-11 DIAGNOSIS — C4441 Basal cell carcinoma of skin of scalp and neck: Secondary | ICD-10-CM | POA: Diagnosis not present

## 2014-03-11 DIAGNOSIS — L570 Actinic keratosis: Secondary | ICD-10-CM | POA: Diagnosis not present

## 2014-03-11 DIAGNOSIS — L821 Other seborrheic keratosis: Secondary | ICD-10-CM | POA: Diagnosis not present

## 2014-03-11 DIAGNOSIS — L219 Seborrheic dermatitis, unspecified: Secondary | ICD-10-CM | POA: Diagnosis not present

## 2014-03-13 ENCOUNTER — Ambulatory Visit (INDEPENDENT_AMBULATORY_CARE_PROVIDER_SITE_OTHER): Payer: Medicare Other | Admitting: Podiatry

## 2014-03-13 ENCOUNTER — Encounter: Payer: Self-pay | Admitting: Podiatry

## 2014-03-13 VITALS — BP 174/92 | HR 55 | Resp 14 | Ht 67.0 in | Wt 160.0 lb

## 2014-03-13 DIAGNOSIS — B351 Tinea unguium: Secondary | ICD-10-CM

## 2014-03-13 DIAGNOSIS — M79671 Pain in right foot: Secondary | ICD-10-CM

## 2014-03-13 DIAGNOSIS — Q828 Other specified congenital malformations of skin: Secondary | ICD-10-CM

## 2014-03-13 NOTE — Progress Notes (Signed)
   Subjective:    Patient ID: Henry Alvarez, male    DOB: December 01, 1924, 81 y.o.   MRN: [id number]  HPI  81 year old male presents the office today with complaints of a painful callus on the bottom his right foot in the ball of the foot. He states his primary care physician as started trim the area however it reoccurs. He denies any recent redness or drainage from the site denies any open lesions. He states that he recently had his nails cut however it was inquiring about treatment for nail fungus. Denies any redness or drainage from the nail sites. No other complaints at this time.   Review of Systems  HENT: Positive for hearing loss.   Neurological: Positive for tremors.  All other systems reviewed and are negative.      Objective:   Physical Exam  AAO 3, NAD  DP  Pulses decrease, PT pulses intact.  CRT less than 3 seconds  Protective sensation intact with Simms Weinstein monofilament, vibratory sensation intact , Achilles tendon reflex intact.  Right foot submetatarsal 2 hyperkeratotic lesion. Upon debridement no underlying ulceration there is no clinical signs of infection.  Nails are hypertrophic, discolored. No surrounding erythema or drainage from the nail sites.  No other open lesions or pre-ulcerative lesions are identified  No other areas of tenderness to bilateral lower extremity is no overlying edema, erythema, increase in warmth.  No pain with calf compression, swelling, warmth, erythema.        Assessment & Plan:   81 year old male with right submetatarsal 2 hyperkeratotic lesion;  Onychomycosis -Treatment options were discussed with the patient including alternatives, risks, complications. -Hyperkeratotic lesion sharply debrided without complication/bleeding. Dispensed offloading pads to help prevent recurrence as quickly. -Discussed the issue an option onychomycosis if he wish to proceed with treatment. Discussed over-the-counter fungi nail this time. -Discussed  importance of daily foot inspection.  -Follow-up in 3 months or sooner should any problems arise. In the meantime, encouraged to call the office with any questions, concerns, change in symptoms.

## 2014-03-16 ENCOUNTER — Encounter: Payer: Self-pay | Admitting: Podiatry

## 2014-04-07 DIAGNOSIS — Z7901 Long term (current) use of anticoagulants: Secondary | ICD-10-CM | POA: Diagnosis not present

## 2014-04-07 DIAGNOSIS — I480 Paroxysmal atrial fibrillation: Secondary | ICD-10-CM | POA: Diagnosis not present

## 2014-04-10 ENCOUNTER — Encounter: Payer: Self-pay | Admitting: Neurology

## 2014-04-20 DIAGNOSIS — H4011X2 Primary open-angle glaucoma, moderate stage: Secondary | ICD-10-CM | POA: Diagnosis not present

## 2014-04-22 ENCOUNTER — Other Ambulatory Visit: Payer: Self-pay

## 2014-04-22 DIAGNOSIS — L570 Actinic keratosis: Secondary | ICD-10-CM | POA: Diagnosis not present

## 2014-04-22 DIAGNOSIS — Z85828 Personal history of other malignant neoplasm of skin: Secondary | ICD-10-CM | POA: Diagnosis not present

## 2014-04-22 DIAGNOSIS — Z08 Encounter for follow-up examination after completed treatment for malignant neoplasm: Secondary | ICD-10-CM | POA: Diagnosis not present

## 2014-05-04 ENCOUNTER — Ambulatory Visit: Payer: Medicare Other | Admitting: Cardiology

## 2014-05-04 ENCOUNTER — Ambulatory Visit (INDEPENDENT_AMBULATORY_CARE_PROVIDER_SITE_OTHER): Payer: Medicare Other | Admitting: Neurology

## 2014-05-04 ENCOUNTER — Encounter: Payer: Self-pay | Admitting: Neurology

## 2014-05-04 VITALS — BP 122/64 | HR 60 | Temp 98.4°F | Resp 16 | Ht 67.0 in | Wt 163.3 lb

## 2014-05-04 DIAGNOSIS — G20 Parkinson's disease: Secondary | ICD-10-CM

## 2014-05-04 DIAGNOSIS — R1310 Dysphagia, unspecified: Secondary | ICD-10-CM

## 2014-05-04 NOTE — Patient Instructions (Addendum)
1. Continue carbidopa-levodopa 1 tablet at 8am, 1 tablet at 12-1pm, 1 tablet at 5pm and 1 tablet at 9pm 2.  Will refer you for swallow evaluation and PT 3.  Follow up in 5 months.

## 2014-05-04 NOTE — Addendum Note (Signed)
Addended by: Charyl Bigger E on: 05/04/2014 03:11 PM   Modules accepted: Orders
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
## 2014-05-05 ENCOUNTER — Telehealth: Payer: Self-pay

## 2014-05-05 DIAGNOSIS — Z87440 Personal history of urinary (tract) infections: Secondary | ICD-10-CM | POA: Diagnosis not present

## 2014-05-05 DIAGNOSIS — N189 Chronic kidney disease, unspecified: Secondary | ICD-10-CM | POA: Diagnosis not present

## 2014-05-05 DIAGNOSIS — G20 Parkinson's disease: Secondary | ICD-10-CM | POA: Diagnosis not present

## 2014-05-05 DIAGNOSIS — I25119 Atherosclerotic heart disease of native coronary artery with unspecified angina pectoris: Secondary | ICD-10-CM | POA: Diagnosis not present

## 2014-05-05 DIAGNOSIS — Z931 Gastrostomy status: Secondary | ICD-10-CM | POA: Diagnosis not present

## 2014-05-05 DIAGNOSIS — Z8673 Personal history of transient ischemic attack (TIA), and cerebral infarction without residual deficits: Secondary | ICD-10-CM | POA: Diagnosis not present

## 2014-05-05 DIAGNOSIS — F329 Major depressive disorder, single episode, unspecified: Secondary | ICD-10-CM | POA: Diagnosis not present

## 2014-05-05 DIAGNOSIS — M1712 Unilateral primary osteoarthritis, left knee: Secondary | ICD-10-CM | POA: Diagnosis not present

## 2014-05-05 DIAGNOSIS — R1310 Dysphagia, unspecified: Secondary | ICD-10-CM | POA: Diagnosis not present

## 2014-05-05 DIAGNOSIS — I480 Paroxysmal atrial fibrillation: Secondary | ICD-10-CM | POA: Diagnosis not present

## 2014-05-05 DIAGNOSIS — I129 Hypertensive chronic kidney disease with stage 1 through stage 4 chronic kidney disease, or unspecified chronic kidney disease: Secondary | ICD-10-CM | POA: Diagnosis not present

## 2014-05-05 DIAGNOSIS — N400 Enlarged prostate without lower urinary tract symptoms: Secondary | ICD-10-CM | POA: Diagnosis not present

## 2014-05-05 DIAGNOSIS — Z7901 Long term (current) use of anticoagulants: Secondary | ICD-10-CM | POA: Diagnosis not present

## 2014-05-05 DIAGNOSIS — H409 Unspecified glaucoma: Secondary | ICD-10-CM | POA: Diagnosis not present

## 2014-05-05 NOTE — Telephone Encounter (Signed)
[name] Henry Alvarez called a appointment offered with Dr.Jordan 05/07/14 at 9:15 am due to a cancellation.Son stated father will not be able to come that day. He can't come that early.Advised to keep appointment as planned.Advised we will put him back on cancellation list and call back if Dr.Jordan has another cancellation.Advised again to go to ER if needed.

## 2014-05-07 ENCOUNTER — Ambulatory Visit: Payer: Medicare Other | Admitting: Cardiology

## 2014-05-07 DIAGNOSIS — H409 Unspecified glaucoma: Secondary | ICD-10-CM | POA: Diagnosis not present

## 2014-05-07 DIAGNOSIS — I480 Paroxysmal atrial fibrillation: Secondary | ICD-10-CM | POA: Diagnosis not present

## 2014-05-07 DIAGNOSIS — I129 Hypertensive chronic kidney disease with stage 1 through stage 4 chronic kidney disease, or unspecified chronic kidney disease: Secondary | ICD-10-CM | POA: Diagnosis not present

## 2014-05-07 DIAGNOSIS — I25119 Atherosclerotic heart disease of native coronary artery with unspecified angina pectoris: Secondary | ICD-10-CM | POA: Diagnosis not present

## 2014-05-07 DIAGNOSIS — M1712 Unilateral primary osteoarthritis, left knee: Secondary | ICD-10-CM | POA: Diagnosis not present

## 2014-05-07 DIAGNOSIS — G20 Parkinson's disease: Secondary | ICD-10-CM | POA: Diagnosis not present

## 2014-05-12 DIAGNOSIS — G20 Parkinson's disease: Secondary | ICD-10-CM | POA: Diagnosis not present

## 2014-05-12 DIAGNOSIS — I129 Hypertensive chronic kidney disease with stage 1 through stage 4 chronic kidney disease, or unspecified chronic kidney disease: Secondary | ICD-10-CM | POA: Diagnosis not present

## 2014-05-12 DIAGNOSIS — I25119 Atherosclerotic heart disease of native coronary artery with unspecified angina pectoris: Secondary | ICD-10-CM | POA: Diagnosis not present

## 2014-05-12 DIAGNOSIS — Z7901 Long term (current) use of anticoagulants: Secondary | ICD-10-CM | POA: Diagnosis not present

## 2014-05-12 DIAGNOSIS — H409 Unspecified glaucoma: Secondary | ICD-10-CM | POA: Diagnosis not present

## 2014-05-12 DIAGNOSIS — I480 Paroxysmal atrial fibrillation: Secondary | ICD-10-CM | POA: Diagnosis not present

## 2014-05-12 DIAGNOSIS — M1712 Unilateral primary osteoarthritis, left knee: Secondary | ICD-10-CM | POA: Diagnosis not present

## 2014-05-13 ENCOUNTER — Telehealth: Payer: Self-pay | Admitting: *Deleted

## 2014-05-13 NOTE — Telephone Encounter (Signed)
-----   Message from Blenda Peals sent at 05/13/2014  2:08 PM EDT ----- Regarding: Dan Europe from [facility] care  Contact: [phone] Please call Dan Europe, she is a physical therapist and would like for a new order to be in places.  Please call her.  Thanks!

## 2014-05-13 NOTE — Telephone Encounter (Signed)
Spoke with Dan Europe  PT gave approval for OT to for evaluation for [employer]

## 2014-05-14 DIAGNOSIS — I25119 Atherosclerotic heart disease of native coronary artery with unspecified angina pectoris: Secondary | ICD-10-CM | POA: Diagnosis not present

## 2014-05-14 DIAGNOSIS — G20 Parkinson's disease: Secondary | ICD-10-CM | POA: Diagnosis not present

## 2014-05-14 DIAGNOSIS — H409 Unspecified glaucoma: Secondary | ICD-10-CM | POA: Diagnosis not present

## 2014-05-14 DIAGNOSIS — I480 Paroxysmal atrial fibrillation: Secondary | ICD-10-CM | POA: Diagnosis not present

## 2014-05-14 DIAGNOSIS — I129 Hypertensive chronic kidney disease with stage 1 through stage 4 chronic kidney disease, or unspecified chronic kidney disease: Secondary | ICD-10-CM | POA: Diagnosis not present

## 2014-05-14 DIAGNOSIS — M1712 Unilateral primary osteoarthritis, left knee: Secondary | ICD-10-CM | POA: Diagnosis not present

## 2014-05-21 DIAGNOSIS — I25119 Atherosclerotic heart disease of native coronary artery with unspecified angina pectoris: Secondary | ICD-10-CM | POA: Diagnosis not present

## 2014-05-21 DIAGNOSIS — M1712 Unilateral primary osteoarthritis, left knee: Secondary | ICD-10-CM | POA: Diagnosis not present

## 2014-05-21 DIAGNOSIS — I129 Hypertensive chronic kidney disease with stage 1 through stage 4 chronic kidney disease, or unspecified chronic kidney disease: Secondary | ICD-10-CM | POA: Diagnosis not present

## 2014-05-21 DIAGNOSIS — I480 Paroxysmal atrial fibrillation: Secondary | ICD-10-CM | POA: Diagnosis not present

## 2014-05-21 DIAGNOSIS — H409 Unspecified glaucoma: Secondary | ICD-10-CM | POA: Diagnosis not present

## 2014-05-21 DIAGNOSIS — G20 Parkinson's disease: Secondary | ICD-10-CM | POA: Diagnosis not present

## 2014-05-22 DIAGNOSIS — M1712 Unilateral primary osteoarthritis, left knee: Secondary | ICD-10-CM | POA: Diagnosis not present

## 2014-05-22 DIAGNOSIS — G20 Parkinson's disease: Secondary | ICD-10-CM | POA: Diagnosis not present

## 2014-05-22 DIAGNOSIS — I480 Paroxysmal atrial fibrillation: Secondary | ICD-10-CM | POA: Diagnosis not present

## 2014-05-22 DIAGNOSIS — I129 Hypertensive chronic kidney disease with stage 1 through stage 4 chronic kidney disease, or unspecified chronic kidney disease: Secondary | ICD-10-CM | POA: Diagnosis not present

## 2014-05-22 DIAGNOSIS — I25119 Atherosclerotic heart disease of native coronary artery with unspecified angina pectoris: Secondary | ICD-10-CM | POA: Diagnosis not present

## 2014-05-22 DIAGNOSIS — H409 Unspecified glaucoma: Secondary | ICD-10-CM | POA: Diagnosis not present

## 2014-05-26 DIAGNOSIS — I480 Paroxysmal atrial fibrillation: Secondary | ICD-10-CM | POA: Diagnosis not present

## 2014-05-26 DIAGNOSIS — G20 Parkinson's disease: Secondary | ICD-10-CM | POA: Diagnosis not present

## 2014-05-26 DIAGNOSIS — I25119 Atherosclerotic heart disease of native coronary artery with unspecified angina pectoris: Secondary | ICD-10-CM | POA: Diagnosis not present

## 2014-05-26 DIAGNOSIS — I129 Hypertensive chronic kidney disease with stage 1 through stage 4 chronic kidney disease, or unspecified chronic kidney disease: Secondary | ICD-10-CM | POA: Diagnosis not present

## 2014-05-26 DIAGNOSIS — H409 Unspecified glaucoma: Secondary | ICD-10-CM | POA: Diagnosis not present

## 2014-05-26 DIAGNOSIS — M1712 Unilateral primary osteoarthritis, left knee: Secondary | ICD-10-CM | POA: Diagnosis not present

## 2014-05-27 ENCOUNTER — Telehealth: Payer: Self-pay | Admitting: *Deleted

## 2014-05-27 ENCOUNTER — Other Ambulatory Visit (HOSPITAL_COMMUNITY): Payer: Self-pay | Admitting: Neurology

## 2014-05-27 DIAGNOSIS — R1314 Dysphagia, pharyngoesophageal phase: Secondary | ICD-10-CM

## 2014-05-27 NOTE — Telephone Encounter (Signed)
Patient son is aware of MBS with [facility] on 06/02/14 at 11:15 am  Telephone number was given as well as directions

## 2014-05-28 ENCOUNTER — Other Ambulatory Visit: Payer: Self-pay | Admitting: *Deleted

## 2014-05-28 MED ORDER — PAROXETINE HCL 10 MG PO TABS: ORAL_TABLET | ORAL | Status: DC

## 2014-05-29 ENCOUNTER — Telehealth: Payer: Self-pay | Admitting: Neurology

## 2014-05-29 NOTE — Telephone Encounter (Signed)
FYI - pt cancelled today's OT appt so he will be one visit short this week. If you have any questions, please call [place] w/ [facility] # [phone] / Gayleen Orem.

## 2014-06-02 ENCOUNTER — Ambulatory Visit (HOSPITAL_COMMUNITY): Payer: Medicare Other

## 2014-06-02 ENCOUNTER — Other Ambulatory Visit (HOSPITAL_COMMUNITY): Payer: Medicare Other

## 2014-06-02 DIAGNOSIS — I480 Paroxysmal atrial fibrillation: Secondary | ICD-10-CM | POA: Diagnosis not present

## 2014-06-02 DIAGNOSIS — M1712 Unilateral primary osteoarthritis, left knee: Secondary | ICD-10-CM | POA: Diagnosis not present

## 2014-06-02 DIAGNOSIS — G20 Parkinson's disease: Secondary | ICD-10-CM | POA: Diagnosis not present

## 2014-06-02 DIAGNOSIS — H409 Unspecified glaucoma: Secondary | ICD-10-CM | POA: Diagnosis not present

## 2014-06-02 DIAGNOSIS — I129 Hypertensive chronic kidney disease with stage 1 through stage 4 chronic kidney disease, or unspecified chronic kidney disease: Secondary | ICD-10-CM | POA: Diagnosis not present

## 2014-06-02 DIAGNOSIS — I25119 Atherosclerotic heart disease of native coronary artery with unspecified angina pectoris: Secondary | ICD-10-CM | POA: Diagnosis not present
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
## 2014-06-03 ENCOUNTER — Ambulatory Visit (INDEPENDENT_AMBULATORY_CARE_PROVIDER_SITE_OTHER): Payer: Medicare Other | Admitting: Cardiology

## 2014-06-03 ENCOUNTER — Encounter: Payer: Medicare Other | Admitting: Nurse Practitioner

## 2014-06-03 ENCOUNTER — Encounter: Payer: Self-pay | Admitting: Cardiology

## 2014-06-03 VITALS — BP 140/60 | HR 50 | Ht 67.0 in | Wt 166.0 lb

## 2014-06-03 DIAGNOSIS — R0600 Dyspnea, unspecified: Secondary | ICD-10-CM | POA: Diagnosis not present

## 2014-06-03 NOTE — Progress Notes (Signed)
06/03/2014 Henry Alvarez   1924/11/13  [id number]  Primary Physician Sheela Stack, MD Primary Cardiologist: Dr. Martinique  Reason for Visit/CC: Routine F/U for Atrial Fibrillation and CAD  HPI:  The patient is a 81 y/o male followed by Dr. Martinique with a long history of tachybradycardia syndrome with paroxysmal atrial fibrillation. He also has a history of coronary disease and is status post stenting of the LAD in 2009. He was seen in the hospital in August 2015 with an episode of rapid atrial fibrillation with a rate of 150 beats per minute. With medical therapy he converted to sinus rhythm with marked sinus bradycardia. Initially his heart rate dropped down into the 30s but then increased. He was seen by Dr. Rayann Heman. A pacemaker was considered but the patient deferred. His other medical issues include Parkinson's, HTN, HLD, chronic anticoagulation, remote TIA, depression, anemia and OA. His wife recently passed away 02/05/2014.  He resides at home with the assistance of a HH RN, 4 hrs a day/ 7 days a week. He ambulates well with a cane. Last seen by Dr. Martinique in October of 2014. He is followed medically by Dr. Forde Dandy who manages his Coumadin/INR and monitors his lipid profile.   He presents to clinic today for routine f/u. He is accompanied by his [facility] RN. He reports that he has done well since his last visit. He denies any chest pain, dyspnea, orthopnea, PND, LEE, fatigue, palpitations, dizziness, syncope, near syncope. He reports full medication compliance. He denies any abnormal bleeding on Coumadin. He is fairly stable on his feet. He denies any recent falls.   EKG today demonstrates sinus bradycardia with a HR of 50 bpm. BP is stable at 140/60.    Current Outpatient Prescriptions  Medication Sig Dispense Refill  . amLODipine (NORVASC) 2.5 MG tablet Take 2.5 mg by mouth every morning.     . carbidopa-levodopa (SINEMET IR) 25-100 MG per tablet Take 1 tablet by mouth 4 (four) times  daily. Take 1.tabs at 18am, 1tab at 12-1pm, 1tab at 5pm and 1tab at 9pm 120 tablet 6  . hydrocortisone 2.5 % cream     . ketoconazole (NIZORAL) 2 % cream     . latanoprost (XALATAN) 0.005 % ophthalmic solution     . Melatonin 3 MG TABS Take 3-6 mg by mouth at bedtime.     . Multiple Vitamin (MULTIVITAMIN WITH MINERALS) TABS Take 1 tablet by mouth daily.    Marland Kitchen PARoxetine (PAXIL) 10 MG tablet Take 0.5 tablets (5 mg total) by mouth daily. 15 tablet 6  . PARoxetine (PAXIL) 10 MG tablet Take 1/2 tablet daily # 15 with 6 refills 15 tablet 6  . traMADol-acetaminophen (ULTRACET) 37.5-325 MG per tablet Take 1 tablet by mouth every 6 (six) hours as needed for pain. 30 tablet 0  . warfarin (COUMADIN) 6 MG tablet Take 9 mg by mouth daily. 6 mg 5 days a week and 9 mg 2 days a week     No current facility-administered medications for this visit.    No Known Allergies  History   Social History  . Marital Status: Married    Spouse Name: N/A  . Number of Children: 2  . Years of Education: N/A   Occupational History  . Retired Nurse, mental health    Social History Main Topics  . Smoking status: Never Smoker   . Smokeless tobacco: Never Used  . Alcohol Use: No     Comment: 09/05/2012 "rarely; I had a glass  of wine ~ 1 month ago"  . Drug Use: No  . Sexual Activity: No   Other Topics Concern  . Not on file   Social History Narrative    Family History  Problem Relation Age of Onset  . Heart attack Father   . Angina Father   . Heart failure Mother   . Hypertension Sister      Review of Systems: General: negative for chills, fever, night sweats or weight changes.  Cardiovascular: negative for chest pain, dyspnea on exertion, edema, orthopnea, palpitations, paroxysmal nocturnal dyspnea or shortness of breath Dermatological: negative for rash Respiratory: negative for cough or wheezing Urologic: negative for hematuria Abdominal: negative for nausea, vomiting, diarrhea, bright red blood per  rectum, melena, or hematemesis Neurologic: negative for visual changes, syncope, or dizziness All other systems reviewed and are otherwise negative except as noted above.    Blood pressure 140/60, pulse 50, height 5\' 7"  (1.702 m), weight 166 lb (75.297 kg).  General appearance: alert, cooperative and no distress Neck: no carotid bruit and no JVD Lungs: clear to auscultation bilaterally Heart: irregularly irregular rhythm and 1/6 SM at RUSB Extremities: no LEE Pulses: 2+ and symmetric Skin: warm and dry Neurologic: Grossly normal  EKG Sinus bradycardia HR 50 bpm.  ASSESSMENT AND PLAN:   1. PAF: EKG show sinus brady. Denies any symptoms of breakthrough atrial fibrillation. Not on any rate control agents due to h/o severe bradycardia. Continue Coumadin for stoke prophylaxis given elevated CHA2DS2 VASc score which includes prior h/o TIA.   2. CAD: remote LAD PCI in 2009. Stable w/o angina. EKG nonischemic. Continue medical therapy.  3. Chronic Anticoagulation: On coumadin for PAF. Followed by PCP. Reports INRs have been therapeutic. Tolerating well w/o abnormal bleeding or falls.  4. HTN: controlled on current regimen.  5. HLD: LP followed by PCP.  6. Cardiac Murmur: soft 1/6 systolic murmur auscultated on exam at RUSB. Patient notes long h/o murmur. Asymptomatic. No further w/u indicated.   7. Bradycardia: Resting HR 50 bpm. Asymptomatic. Continue to avoid AVN blocking agents.    PLAN  Continue current plan of care. No changes made in medications. Patient instructed to f/u with Dr. Martinique in 6 months or sooner if needed.   SIMMONS, BRITTAINYPA-C 06/03/2014 4:12 PM

## 2014-06-03 NOTE — Patient Instructions (Signed)
Medication Instructions:  Your physician recommends that you continue on your current medications as directed. Please refer to the Current Medication list given to you today.   Labwork: NONE AT THIS TIME   Testing/Procedures: NONE AT THIS TIME  Follow-Up: Your physician wants you to follow-up in:  IN 6 MONTHS WITH DR Martinique  You will receive a reminder letter in the mail two months in advance. If you don't receive a letter, please call our office to schedule the follow-up appointment.'  Any Other Special Instructions Will Be Listed Below (If Applicable).

## 2014-06-04 DIAGNOSIS — G20 Parkinson's disease: Secondary | ICD-10-CM | POA: Diagnosis not present

## 2014-06-04 DIAGNOSIS — I25119 Atherosclerotic heart disease of native coronary artery with unspecified angina pectoris: Secondary | ICD-10-CM | POA: Diagnosis not present

## 2014-06-04 DIAGNOSIS — M1712 Unilateral primary osteoarthritis, left knee: Secondary | ICD-10-CM | POA: Diagnosis not present

## 2014-06-04 DIAGNOSIS — H409 Unspecified glaucoma: Secondary | ICD-10-CM | POA: Diagnosis not present

## 2014-06-04 DIAGNOSIS — I480 Paroxysmal atrial fibrillation: Secondary | ICD-10-CM | POA: Diagnosis not present

## 2014-06-04 DIAGNOSIS — I129 Hypertensive chronic kidney disease with stage 1 through stage 4 chronic kidney disease, or unspecified chronic kidney disease: Secondary | ICD-10-CM | POA: Diagnosis not present

## 2014-06-05 ENCOUNTER — Telehealth: Payer: Self-pay | Admitting: *Deleted

## 2014-06-08 ENCOUNTER — Ambulatory Visit (HOSPITAL_COMMUNITY)
Admission: RE | Admit: 2014-06-08 | Discharge: 2014-06-08 | Disposition: A | Discharge status: Home / Self Care | Payer: Medicare Other | Source: Ambulatory Visit | Attending: Neurology | Admitting: Neurology

## 2014-06-08 ENCOUNTER — Other Ambulatory Visit (HOSPITAL_COMMUNITY): Payer: Self-pay | Admitting: Neurology

## 2014-06-08 ENCOUNTER — Other Ambulatory Visit (HOSPITAL_COMMUNITY): Payer: Medicare Other

## 2014-06-08 DIAGNOSIS — I129 Hypertensive chronic kidney disease with stage 1 through stage 4 chronic kidney disease, or unspecified chronic kidney disease: Secondary | ICD-10-CM | POA: Insufficient documentation

## 2014-06-08 DIAGNOSIS — R1311 Dysphagia, oral phase: Secondary | ICD-10-CM | POA: Insufficient documentation

## 2014-06-08 DIAGNOSIS — R1314 Dysphagia, pharyngoesophageal phase: Secondary | ICD-10-CM

## 2014-06-08 DIAGNOSIS — I2510 Atherosclerotic heart disease of native coronary artery without angina pectoris: Secondary | ICD-10-CM | POA: Diagnosis not present

## 2014-06-08 DIAGNOSIS — J45909 Unspecified asthma, uncomplicated: Secondary | ICD-10-CM | POA: Diagnosis not present

## 2014-06-08 DIAGNOSIS — R1310 Dysphagia, unspecified: Secondary | ICD-10-CM | POA: Diagnosis not present

## 2014-06-08 DIAGNOSIS — G20 Parkinson's disease: Secondary | ICD-10-CM | POA: Diagnosis not present

## 2014-06-08 DIAGNOSIS — Z8673 Personal history of transient ischemic attack (TIA), and cerebral infarction without residual deficits: Secondary | ICD-10-CM | POA: Insufficient documentation

## 2014-06-08 DIAGNOSIS — E785 Hyperlipidemia, unspecified: Secondary | ICD-10-CM | POA: Insufficient documentation

## 2014-06-08 DIAGNOSIS — I480 Paroxysmal atrial fibrillation: Secondary | ICD-10-CM | POA: Insufficient documentation

## 2014-06-08 DIAGNOSIS — H409 Unspecified glaucoma: Secondary | ICD-10-CM | POA: Diagnosis not present

## 2014-06-08 DIAGNOSIS — N400 Enlarged prostate without lower urinary tract symptoms: Secondary | ICD-10-CM | POA: Diagnosis not present

## 2014-06-08 DIAGNOSIS — M1712 Unilateral primary osteoarthritis, left knee: Secondary | ICD-10-CM | POA: Diagnosis not present

## 2014-06-08 DIAGNOSIS — Z7901 Long term (current) use of anticoagulants: Secondary | ICD-10-CM | POA: Diagnosis not present

## 2014-06-08 DIAGNOSIS — N189 Chronic kidney disease, unspecified: Secondary | ICD-10-CM | POA: Diagnosis not present

## 2014-06-08 DIAGNOSIS — I25119 Atherosclerotic heart disease of native coronary artery with unspecified angina pectoris: Secondary | ICD-10-CM | POA: Diagnosis not present

## 2014-06-08 NOTE — Telephone Encounter (Signed)
error 

## 2014-06-09 DIAGNOSIS — H409 Unspecified glaucoma: Secondary | ICD-10-CM | POA: Diagnosis not present

## 2014-06-09 DIAGNOSIS — I480 Paroxysmal atrial fibrillation: Secondary | ICD-10-CM | POA: Diagnosis not present

## 2014-06-09 DIAGNOSIS — I25119 Atherosclerotic heart disease of native coronary artery with unspecified angina pectoris: Secondary | ICD-10-CM | POA: Diagnosis not present

## 2014-06-09 DIAGNOSIS — M1712 Unilateral primary osteoarthritis, left knee: Secondary | ICD-10-CM | POA: Diagnosis not present

## 2014-06-09 DIAGNOSIS — I129 Hypertensive chronic kidney disease with stage 1 through stage 4 chronic kidney disease, or unspecified chronic kidney disease: Secondary | ICD-10-CM | POA: Diagnosis not present

## 2014-06-09 DIAGNOSIS — G20 Parkinson's disease: Secondary | ICD-10-CM | POA: Diagnosis not present

## 2014-06-11 DIAGNOSIS — I25119 Atherosclerotic heart disease of native coronary artery with unspecified angina pectoris: Secondary | ICD-10-CM | POA: Diagnosis not present

## 2014-06-11 DIAGNOSIS — G20 Parkinson's disease: Secondary | ICD-10-CM | POA: Diagnosis not present

## 2014-06-11 DIAGNOSIS — I129 Hypertensive chronic kidney disease with stage 1 through stage 4 chronic kidney disease, or unspecified chronic kidney disease: Secondary | ICD-10-CM | POA: Diagnosis not present

## 2014-06-11 DIAGNOSIS — I480 Paroxysmal atrial fibrillation: Secondary | ICD-10-CM | POA: Diagnosis not present

## 2014-06-11 DIAGNOSIS — M1712 Unilateral primary osteoarthritis, left knee: Secondary | ICD-10-CM | POA: Diagnosis not present

## 2014-06-11 DIAGNOSIS — H409 Unspecified glaucoma: Secondary | ICD-10-CM | POA: Diagnosis not present

## 2014-06-12 ENCOUNTER — Emergency Department (HOSPITAL_COMMUNITY): Payer: Medicare Other

## 2014-06-12 ENCOUNTER — Emergency Department (HOSPITAL_COMMUNITY)
Admission: EM | Admit: 2014-06-12 | Discharge: 2014-06-12 | Disposition: A | Discharge status: Home / Self Care | Payer: Medicare Other | Attending: Emergency Medicine | Admitting: Emergency Medicine

## 2014-06-12 ENCOUNTER — Encounter (HOSPITAL_COMMUNITY): Payer: Self-pay | Admitting: Emergency Medicine

## 2014-06-12 DIAGNOSIS — S79911A Unspecified injury of right hip, initial encounter: Secondary | ICD-10-CM | POA: Diagnosis not present

## 2014-06-12 DIAGNOSIS — W010XXA Fall on same level from slipping, tripping and stumbling without subsequent striking against object, initial encounter: Secondary | ICD-10-CM | POA: Insufficient documentation

## 2014-06-12 DIAGNOSIS — Z7901 Long term (current) use of anticoagulants: Secondary | ICD-10-CM | POA: Diagnosis not present

## 2014-06-12 DIAGNOSIS — Z9861 Coronary angioplasty status: Secondary | ICD-10-CM | POA: Diagnosis not present

## 2014-06-12 DIAGNOSIS — Z79899 Other long term (current) drug therapy: Secondary | ICD-10-CM | POA: Insufficient documentation

## 2014-06-12 DIAGNOSIS — Y9389 Activity, other specified: Secondary | ICD-10-CM | POA: Diagnosis not present

## 2014-06-12 DIAGNOSIS — Z85828 Personal history of other malignant neoplasm of skin: Secondary | ICD-10-CM | POA: Diagnosis not present

## 2014-06-12 DIAGNOSIS — Z87440 Personal history of urinary (tract) infections: Secondary | ICD-10-CM | POA: Diagnosis not present

## 2014-06-12 DIAGNOSIS — Z9889 Other specified postprocedural states: Secondary | ICD-10-CM | POA: Insufficient documentation

## 2014-06-12 DIAGNOSIS — S40011A Contusion of right shoulder, initial encounter: Secondary | ICD-10-CM

## 2014-06-12 DIAGNOSIS — Y9289 Other specified places as the place of occurrence of the external cause: Secondary | ICD-10-CM | POA: Insufficient documentation

## 2014-06-12 DIAGNOSIS — Z8781 Personal history of (healed) traumatic fracture: Secondary | ICD-10-CM | POA: Insufficient documentation

## 2014-06-12 DIAGNOSIS — M25551 Pain in right hip: Secondary | ICD-10-CM | POA: Diagnosis not present

## 2014-06-12 DIAGNOSIS — Y998 Other external cause status: Secondary | ICD-10-CM | POA: Diagnosis not present

## 2014-06-12 DIAGNOSIS — N189 Chronic kidney disease, unspecified: Secondary | ICD-10-CM | POA: Insufficient documentation

## 2014-06-12 DIAGNOSIS — W19XXXA Unspecified fall, initial encounter: Secondary | ICD-10-CM

## 2014-06-12 DIAGNOSIS — M25511 Pain in right shoulder: Secondary | ICD-10-CM | POA: Diagnosis not present

## 2014-06-12 DIAGNOSIS — I25119 Atherosclerotic heart disease of native coronary artery with unspecified angina pectoris: Secondary | ICD-10-CM | POA: Diagnosis not present

## 2014-06-12 DIAGNOSIS — J45909 Unspecified asthma, uncomplicated: Secondary | ICD-10-CM | POA: Diagnosis not present

## 2014-06-12 DIAGNOSIS — I129 Hypertensive chronic kidney disease with stage 1 through stage 4 chronic kidney disease, or unspecified chronic kidney disease: Secondary | ICD-10-CM | POA: Diagnosis not present

## 2014-06-12 DIAGNOSIS — S7011XA Contusion of right thigh, initial encounter: Secondary | ICD-10-CM | POA: Diagnosis not present

## 2014-06-12 DIAGNOSIS — Z8639 Personal history of other endocrine, nutritional and metabolic disease: Secondary | ICD-10-CM | POA: Diagnosis not present

## 2014-06-12 DIAGNOSIS — S4991XA Unspecified injury of right shoulder and upper arm, initial encounter: Secondary | ICD-10-CM | POA: Diagnosis present

## 2014-06-12 DIAGNOSIS — I480 Paroxysmal atrial fibrillation: Secondary | ICD-10-CM | POA: Diagnosis not present

## 2014-06-12 NOTE — Discharge Instructions (Signed)
Tylenol or ultram for pain.  Follow up with your md next week

## 2014-06-12 NOTE — ED Notes (Addendum)
Pt from home c/o fall in which he fell on the side walk.  He did not hit head and NO LOC. He takes coumadin. Pt c/o right hip pain, right shoulder pain, and right thigh pain. He has abrasions to right forearm which are wrapped. Pt took tramadol at 1800.
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
## 2014-06-17 DIAGNOSIS — I129 Hypertensive chronic kidney disease with stage 1 through stage 4 chronic kidney disease, or unspecified chronic kidney disease: Secondary | ICD-10-CM | POA: Diagnosis not present

## 2014-06-17 DIAGNOSIS — I25119 Atherosclerotic heart disease of native coronary artery with unspecified angina pectoris: Secondary | ICD-10-CM | POA: Diagnosis not present

## 2014-06-17 DIAGNOSIS — I480 Paroxysmal atrial fibrillation: Secondary | ICD-10-CM | POA: Diagnosis not present

## 2014-06-17 DIAGNOSIS — M1712 Unilateral primary osteoarthritis, left knee: Secondary | ICD-10-CM | POA: Diagnosis not present

## 2014-06-17 DIAGNOSIS — G20 Parkinson's disease: Secondary | ICD-10-CM | POA: Diagnosis not present

## 2014-06-17 DIAGNOSIS — H409 Unspecified glaucoma: Secondary | ICD-10-CM | POA: Diagnosis not present

## 2014-06-19 ENCOUNTER — Telehealth: Payer: Self-pay | Admitting: *Deleted

## 2014-06-19 DIAGNOSIS — M1712 Unilateral primary osteoarthritis, left knee: Secondary | ICD-10-CM | POA: Diagnosis not present

## 2014-06-19 DIAGNOSIS — G20 Parkinson's disease: Secondary | ICD-10-CM | POA: Diagnosis not present

## 2014-06-19 DIAGNOSIS — H409 Unspecified glaucoma: Secondary | ICD-10-CM | POA: Diagnosis not present

## 2014-06-19 DIAGNOSIS — I129 Hypertensive chronic kidney disease with stage 1 through stage 4 chronic kidney disease, or unspecified chronic kidney disease: Secondary | ICD-10-CM | POA: Diagnosis not present

## 2014-06-19 DIAGNOSIS — I25119 Atherosclerotic heart disease of native coronary artery with unspecified angina pectoris: Secondary | ICD-10-CM | POA: Diagnosis not present

## 2014-06-19 DIAGNOSIS — I480 Paroxysmal atrial fibrillation: Secondary | ICD-10-CM | POA: Diagnosis not present

## 2014-06-19 NOTE — Telephone Encounter (Signed)
[name] OT from [facility] called stating patient had a fall yesterday he was seen at [place]  With no fractures noted  However he is not feeling well so he is going to be seen by PCP or ED which ever will see him first . OT is a missed visit for today

## 2014-06-22 ENCOUNTER — Telehealth: Payer: Self-pay | Admitting: *Deleted

## 2014-06-22 DIAGNOSIS — I2510 Atherosclerotic heart disease of native coronary artery without angina pectoris: Secondary | ICD-10-CM | POA: Diagnosis not present

## 2014-06-22 DIAGNOSIS — E785 Hyperlipidemia, unspecified: Secondary | ICD-10-CM | POA: Diagnosis not present

## 2014-06-22 DIAGNOSIS — L84 Corns and callosities: Secondary | ICD-10-CM | POA: Diagnosis not present

## 2014-06-22 DIAGNOSIS — R269 Unspecified abnormalities of gait and mobility: Secondary | ICD-10-CM | POA: Diagnosis not present

## 2014-06-22 DIAGNOSIS — I10 Essential (primary) hypertension: Secondary | ICD-10-CM | POA: Diagnosis not present

## 2014-06-22 DIAGNOSIS — G20 Parkinson's disease: Secondary | ICD-10-CM | POA: Diagnosis not present

## 2014-06-22 DIAGNOSIS — Z1389 Encounter for screening for other disorder: Secondary | ICD-10-CM | POA: Diagnosis not present

## 2014-06-22 DIAGNOSIS — I480 Paroxysmal atrial fibrillation: Secondary | ICD-10-CM | POA: Diagnosis not present

## 2014-06-22 DIAGNOSIS — Z6826 Body mass index (BMI) 26.0-26.9, adult: Secondary | ICD-10-CM | POA: Diagnosis not present

## 2014-06-22 NOTE — Telephone Encounter (Signed)
Jalene Mullet Sokolsky patients physical therapist called to report a fall for 5/6. Bruising in leg and pain to right leg and shoulder.  [phone] no call back needed reporting only

## 2014-06-22 NOTE — Telephone Encounter (Signed)
[place] OT has made [place] aware of the fall

## 2014-06-23 DIAGNOSIS — H409 Unspecified glaucoma: Secondary | ICD-10-CM | POA: Diagnosis not present

## 2014-06-23 DIAGNOSIS — G20 Parkinson's disease: Secondary | ICD-10-CM | POA: Diagnosis not present

## 2014-06-23 DIAGNOSIS — I25119 Atherosclerotic heart disease of native coronary artery with unspecified angina pectoris: Secondary | ICD-10-CM | POA: Diagnosis not present

## 2014-06-23 DIAGNOSIS — I480 Paroxysmal atrial fibrillation: Secondary | ICD-10-CM | POA: Diagnosis not present

## 2014-06-23 DIAGNOSIS — M1712 Unilateral primary osteoarthritis, left knee: Secondary | ICD-10-CM | POA: Diagnosis not present

## 2014-06-23 DIAGNOSIS — I129 Hypertensive chronic kidney disease with stage 1 through stage 4 chronic kidney disease, or unspecified chronic kidney disease: Secondary | ICD-10-CM | POA: Diagnosis not present

## 2014-06-24 DIAGNOSIS — H409 Unspecified glaucoma: Secondary | ICD-10-CM | POA: Diagnosis not present

## 2014-06-24 DIAGNOSIS — I480 Paroxysmal atrial fibrillation: Secondary | ICD-10-CM | POA: Diagnosis not present

## 2014-06-24 DIAGNOSIS — M1712 Unilateral primary osteoarthritis, left knee: Secondary | ICD-10-CM | POA: Diagnosis not present

## 2014-06-24 DIAGNOSIS — I25119 Atherosclerotic heart disease of native coronary artery with unspecified angina pectoris: Secondary | ICD-10-CM | POA: Diagnosis not present

## 2014-06-24 DIAGNOSIS — G20 Parkinson's disease: Secondary | ICD-10-CM | POA: Diagnosis not present

## 2014-06-24 DIAGNOSIS — I129 Hypertensive chronic kidney disease with stage 1 through stage 4 chronic kidney disease, or unspecified chronic kidney disease: Secondary | ICD-10-CM | POA: Diagnosis not present

## 2014-06-25 ENCOUNTER — Telehealth: Payer: Self-pay | Admitting: Neurology

## 2014-06-25 ENCOUNTER — Telehealth: Payer: Self-pay | Admitting: *Deleted

## 2014-06-25 NOTE — Telephone Encounter (Signed)
Son [name] called to let us know that patient has had a fall on 06/12/14   And he has started to have more memory issues as well as a bit of aggression  They are asking if maybe a increase in paxil would help and does he need to have testing MRI he is worried about alzheimer's disease. His son is asking do they need to come in for a office visit to discuss this ? Please advise

## 2014-06-25 NOTE — Telephone Encounter (Signed)
Pt son [name] called and wants to talk to someone about pt falling and medication dosage change please call [name] at [phone]

## 2014-06-25 NOTE — Telephone Encounter (Signed)
Patient son Henry Alvarez) returning your call  Call back [phone]

## 2014-06-25 NOTE — Telephone Encounter (Signed)
I left a message for patient's son to return my call regarding his message  About falls

## 2014-06-25 NOTE — Telephone Encounter (Signed)
We can increase paxil to 10mg  (I believe he is taking 5mg ).  He can come in for memory assessment.

## 2014-06-25 NOTE — Telephone Encounter (Signed)
I left a message for Henry Alvarez per Dr Tomi Likens         We can increase paxil to 10mg  (I believe he is taking 5mg ). He can come in for memory assessment.

## 2014-06-26 ENCOUNTER — Telehealth: Payer: Self-pay | Admitting: Neurology

## 2014-06-26 DIAGNOSIS — G20 Parkinson's disease: Secondary | ICD-10-CM | POA: Diagnosis not present

## 2014-06-26 DIAGNOSIS — I25119 Atherosclerotic heart disease of native coronary artery with unspecified angina pectoris: Secondary | ICD-10-CM | POA: Diagnosis not present

## 2014-06-26 DIAGNOSIS — M1712 Unilateral primary osteoarthritis, left knee: Secondary | ICD-10-CM | POA: Diagnosis not present

## 2014-06-26 DIAGNOSIS — H409 Unspecified glaucoma: Secondary | ICD-10-CM | POA: Diagnosis not present

## 2014-06-26 DIAGNOSIS — I129 Hypertensive chronic kidney disease with stage 1 through stage 4 chronic kidney disease, or unspecified chronic kidney disease: Secondary | ICD-10-CM | POA: Diagnosis not present

## 2014-06-26 DIAGNOSIS — I480 Paroxysmal atrial fibrillation: Secondary | ICD-10-CM | POA: Diagnosis not present

## 2014-06-26 NOTE — Telephone Encounter (Signed)
Returned call. Gave verbal ok for 4 more OT visits.

## 2014-06-26 NOTE — Telephone Encounter (Signed)
Henry Alvarez with [facility] called in regards to [facility], he is wanting to get an order for four more visits, please [phone]/Dawn

## 2014-06-29 ENCOUNTER — Other Ambulatory Visit: Payer: Self-pay | Admitting: Dermatology

## 2014-06-29 DIAGNOSIS — C4442 Squamous cell carcinoma of skin of scalp and neck: Secondary | ICD-10-CM | POA: Diagnosis not present

## 2014-06-29 DIAGNOSIS — S8001XD Contusion of right knee, subsequent encounter: Secondary | ICD-10-CM | POA: Diagnosis not present

## 2014-06-29 DIAGNOSIS — L570 Actinic keratosis: Secondary | ICD-10-CM | POA: Diagnosis not present

## 2014-06-29 DIAGNOSIS — D044 Carcinoma in situ of skin of scalp and neck: Secondary | ICD-10-CM | POA: Diagnosis not present

## 2014-07-02 DIAGNOSIS — I25119 Atherosclerotic heart disease of native coronary artery with unspecified angina pectoris: Secondary | ICD-10-CM | POA: Diagnosis not present

## 2014-07-02 DIAGNOSIS — I480 Paroxysmal atrial fibrillation: Secondary | ICD-10-CM | POA: Diagnosis not present

## 2014-07-02 DIAGNOSIS — I129 Hypertensive chronic kidney disease with stage 1 through stage 4 chronic kidney disease, or unspecified chronic kidney disease: Secondary | ICD-10-CM | POA: Diagnosis not present

## 2014-07-02 DIAGNOSIS — H409 Unspecified glaucoma: Secondary | ICD-10-CM | POA: Diagnosis not present

## 2014-07-02 DIAGNOSIS — M1712 Unilateral primary osteoarthritis, left knee: Secondary | ICD-10-CM | POA: Diagnosis not present

## 2014-07-02 DIAGNOSIS — G20 Parkinson's disease: Secondary | ICD-10-CM | POA: Diagnosis not present

## 2014-07-04 DIAGNOSIS — I25119 Atherosclerotic heart disease of native coronary artery with unspecified angina pectoris: Secondary | ICD-10-CM | POA: Diagnosis not present

## 2014-07-04 DIAGNOSIS — H409 Unspecified glaucoma: Secondary | ICD-10-CM | POA: Diagnosis not present

## 2014-07-04 DIAGNOSIS — I129 Hypertensive chronic kidney disease with stage 1 through stage 4 chronic kidney disease, or unspecified chronic kidney disease: Secondary | ICD-10-CM | POA: Diagnosis not present

## 2014-07-04 DIAGNOSIS — M1712 Unilateral primary osteoarthritis, left knee: Secondary | ICD-10-CM | POA: Diagnosis not present

## 2014-07-04 DIAGNOSIS — Z8673 Personal history of transient ischemic attack (TIA), and cerebral infarction without residual deficits: Secondary | ICD-10-CM | POA: Diagnosis not present

## 2014-07-04 DIAGNOSIS — R1310 Dysphagia, unspecified: Secondary | ICD-10-CM | POA: Diagnosis not present

## 2014-07-04 DIAGNOSIS — Z931 Gastrostomy status: Secondary | ICD-10-CM | POA: Diagnosis not present

## 2014-07-04 DIAGNOSIS — Z87440 Personal history of urinary (tract) infections: Secondary | ICD-10-CM | POA: Diagnosis not present

## 2014-07-04 DIAGNOSIS — N400 Enlarged prostate without lower urinary tract symptoms: Secondary | ICD-10-CM | POA: Diagnosis not present

## 2014-07-04 DIAGNOSIS — N189 Chronic kidney disease, unspecified: Secondary | ICD-10-CM | POA: Diagnosis not present

## 2014-07-04 DIAGNOSIS — I480 Paroxysmal atrial fibrillation: Secondary | ICD-10-CM | POA: Diagnosis not present

## 2014-07-04 DIAGNOSIS — Z7901 Long term (current) use of anticoagulants: Secondary | ICD-10-CM | POA: Diagnosis not present

## 2014-07-04 DIAGNOSIS — G20 Parkinson's disease: Secondary | ICD-10-CM | POA: Diagnosis not present

## 2014-07-04 DIAGNOSIS — F329 Major depressive disorder, single episode, unspecified: Secondary | ICD-10-CM | POA: Diagnosis not present

## 2014-07-07 ENCOUNTER — Other Ambulatory Visit: Payer: Self-pay | Admitting: *Deleted

## 2014-07-07 MED ORDER — CARBIDOPA-LEVODOPA 25-100 MG PO TABS: 1.0000 | ORAL_TABLET | Freq: Four times a day (QID) | ORAL | Status: DC

## 2014-07-08 ENCOUNTER — Telehealth: Payer: Self-pay | Admitting: *Deleted

## 2014-07-08 ENCOUNTER — Other Ambulatory Visit: Payer: Self-pay | Admitting: *Deleted

## 2014-07-08 DIAGNOSIS — I129 Hypertensive chronic kidney disease with stage 1 through stage 4 chronic kidney disease, or unspecified chronic kidney disease: Secondary | ICD-10-CM | POA: Diagnosis not present

## 2014-07-08 DIAGNOSIS — I480 Paroxysmal atrial fibrillation: Secondary | ICD-10-CM | POA: Diagnosis not present

## 2014-07-08 DIAGNOSIS — G20 Parkinson's disease: Secondary | ICD-10-CM | POA: Diagnosis not present

## 2014-07-08 DIAGNOSIS — H409 Unspecified glaucoma: Secondary | ICD-10-CM | POA: Diagnosis not present

## 2014-07-08 DIAGNOSIS — I25119 Atherosclerotic heart disease of native coronary artery with unspecified angina pectoris: Secondary | ICD-10-CM | POA: Diagnosis not present

## 2014-07-08 DIAGNOSIS — M1712 Unilateral primary osteoarthritis, left knee: Secondary | ICD-10-CM | POA: Diagnosis not present

## 2014-07-08 MED ORDER — PAROXETINE HCL 10 MG PO TABS: 10.0000 mg | ORAL_TABLET | Freq: Every day | ORAL | Status: DC

## 2014-07-08 NOTE — Telephone Encounter (Signed)
Patients son [name] ) called and would like to speak with you about his fathers medication Call back number [phone]

## 2014-07-08 NOTE — Telephone Encounter (Signed)
RX sent to pharmacy for paxil 10 mg 1 po qd 30 with 3 refills

## 2014-07-09 DIAGNOSIS — I129 Hypertensive chronic kidney disease with stage 1 through stage 4 chronic kidney disease, or unspecified chronic kidney disease: Secondary | ICD-10-CM | POA: Diagnosis not present

## 2014-07-09 DIAGNOSIS — H409 Unspecified glaucoma: Secondary | ICD-10-CM | POA: Diagnosis not present

## 2014-07-09 DIAGNOSIS — I25119 Atherosclerotic heart disease of native coronary artery with unspecified angina pectoris: Secondary | ICD-10-CM | POA: Diagnosis not present

## 2014-07-09 DIAGNOSIS — G20 Parkinson's disease: Secondary | ICD-10-CM | POA: Diagnosis not present

## 2014-07-09 DIAGNOSIS — M1712 Unilateral primary osteoarthritis, left knee: Secondary | ICD-10-CM | POA: Diagnosis not present

## 2014-07-09 DIAGNOSIS — I480 Paroxysmal atrial fibrillation: Secondary | ICD-10-CM | POA: Diagnosis not present

## 2014-07-10 DIAGNOSIS — M1712 Unilateral primary osteoarthritis, left knee: Secondary | ICD-10-CM | POA: Diagnosis not present

## 2014-07-10 DIAGNOSIS — I480 Paroxysmal atrial fibrillation: Secondary | ICD-10-CM | POA: Diagnosis not present

## 2014-07-10 DIAGNOSIS — G20 Parkinson's disease: Secondary | ICD-10-CM | POA: Diagnosis not present

## 2014-07-10 DIAGNOSIS — H409 Unspecified glaucoma: Secondary | ICD-10-CM | POA: Diagnosis not present

## 2014-07-10 DIAGNOSIS — I25119 Atherosclerotic heart disease of native coronary artery with unspecified angina pectoris: Secondary | ICD-10-CM | POA: Diagnosis not present

## 2014-07-10 DIAGNOSIS — I129 Hypertensive chronic kidney disease with stage 1 through stage 4 chronic kidney disease, or unspecified chronic kidney disease: Secondary | ICD-10-CM | POA: Diagnosis not present

## 2014-07-13 ENCOUNTER — Ambulatory Visit: Payer: Medicare Other | Admitting: Cardiology

## 2014-07-13 DIAGNOSIS — I129 Hypertensive chronic kidney disease with stage 1 through stage 4 chronic kidney disease, or unspecified chronic kidney disease: Secondary | ICD-10-CM | POA: Diagnosis not present

## 2014-07-13 DIAGNOSIS — I25119 Atherosclerotic heart disease of native coronary artery with unspecified angina pectoris: Secondary | ICD-10-CM | POA: Diagnosis not present

## 2014-07-13 DIAGNOSIS — H409 Unspecified glaucoma: Secondary | ICD-10-CM | POA: Diagnosis not present

## 2014-07-13 DIAGNOSIS — I480 Paroxysmal atrial fibrillation: Secondary | ICD-10-CM | POA: Diagnosis not present

## 2014-07-13 DIAGNOSIS — M1712 Unilateral primary osteoarthritis, left knee: Secondary | ICD-10-CM | POA: Diagnosis not present

## 2014-07-13 DIAGNOSIS — G20 Parkinson's disease: Secondary | ICD-10-CM | POA: Diagnosis not present

## 2014-07-15 DIAGNOSIS — H409 Unspecified glaucoma: Secondary | ICD-10-CM | POA: Diagnosis not present

## 2014-07-15 DIAGNOSIS — I25119 Atherosclerotic heart disease of native coronary artery with unspecified angina pectoris: Secondary | ICD-10-CM | POA: Diagnosis not present

## 2014-07-15 DIAGNOSIS — I480 Paroxysmal atrial fibrillation: Secondary | ICD-10-CM | POA: Diagnosis not present

## 2014-07-15 DIAGNOSIS — G20 Parkinson's disease: Secondary | ICD-10-CM | POA: Diagnosis not present

## 2014-07-15 DIAGNOSIS — M1712 Unilateral primary osteoarthritis, left knee: Secondary | ICD-10-CM | POA: Diagnosis not present

## 2014-07-15 DIAGNOSIS — I129 Hypertensive chronic kidney disease with stage 1 through stage 4 chronic kidney disease, or unspecified chronic kidney disease: Secondary | ICD-10-CM | POA: Diagnosis not present

## 2014-07-16 ENCOUNTER — Ambulatory Visit: Payer: Medicare Other | Admitting: Cardiology

## 2014-07-17 DIAGNOSIS — G20 Parkinson's disease: Secondary | ICD-10-CM | POA: Diagnosis not present

## 2014-07-17 DIAGNOSIS — I480 Paroxysmal atrial fibrillation: Secondary | ICD-10-CM | POA: Diagnosis not present

## 2014-07-17 DIAGNOSIS — M1712 Unilateral primary osteoarthritis, left knee: Secondary | ICD-10-CM | POA: Diagnosis not present

## 2014-07-17 DIAGNOSIS — I25119 Atherosclerotic heart disease of native coronary artery with unspecified angina pectoris: Secondary | ICD-10-CM | POA: Diagnosis not present

## 2014-07-17 DIAGNOSIS — H409 Unspecified glaucoma: Secondary | ICD-10-CM | POA: Diagnosis not present

## 2014-07-17 DIAGNOSIS — I129 Hypertensive chronic kidney disease with stage 1 through stage 4 chronic kidney disease, or unspecified chronic kidney disease: Secondary | ICD-10-CM | POA: Diagnosis not present

## 2014-07-20 DIAGNOSIS — H4011X2 Primary open-angle glaucoma, moderate stage: Secondary | ICD-10-CM | POA: Diagnosis not present

## 2014-07-21 DIAGNOSIS — M1712 Unilateral primary osteoarthritis, left knee: Secondary | ICD-10-CM | POA: Diagnosis not present

## 2014-07-21 DIAGNOSIS — I25119 Atherosclerotic heart disease of native coronary artery with unspecified angina pectoris: Secondary | ICD-10-CM | POA: Diagnosis not present

## 2014-07-21 DIAGNOSIS — G20 Parkinson's disease: Secondary | ICD-10-CM | POA: Diagnosis not present

## 2014-07-21 DIAGNOSIS — H409 Unspecified glaucoma: Secondary | ICD-10-CM | POA: Diagnosis not present

## 2014-07-21 DIAGNOSIS — I129 Hypertensive chronic kidney disease with stage 1 through stage 4 chronic kidney disease, or unspecified chronic kidney disease: Secondary | ICD-10-CM | POA: Diagnosis not present

## 2014-07-21 DIAGNOSIS — I480 Paroxysmal atrial fibrillation: Secondary | ICD-10-CM | POA: Diagnosis not present

## 2014-07-23 DIAGNOSIS — M1712 Unilateral primary osteoarthritis, left knee: Secondary | ICD-10-CM | POA: Diagnosis not present

## 2014-07-23 DIAGNOSIS — G20 Parkinson's disease: Secondary | ICD-10-CM | POA: Diagnosis not present

## 2014-07-23 DIAGNOSIS — I480 Paroxysmal atrial fibrillation: Secondary | ICD-10-CM | POA: Diagnosis not present

## 2014-07-23 DIAGNOSIS — I129 Hypertensive chronic kidney disease with stage 1 through stage 4 chronic kidney disease, or unspecified chronic kidney disease: Secondary | ICD-10-CM | POA: Diagnosis not present

## 2014-07-23 DIAGNOSIS — H409 Unspecified glaucoma: Secondary | ICD-10-CM | POA: Diagnosis not present

## 2014-07-23 DIAGNOSIS — I25119 Atherosclerotic heart disease of native coronary artery with unspecified angina pectoris: Secondary | ICD-10-CM | POA: Diagnosis not present

## 2014-07-24 DIAGNOSIS — I480 Paroxysmal atrial fibrillation: Secondary | ICD-10-CM | POA: Diagnosis not present

## 2014-07-24 DIAGNOSIS — M1712 Unilateral primary osteoarthritis, left knee: Secondary | ICD-10-CM | POA: Diagnosis not present

## 2014-07-24 DIAGNOSIS — I129 Hypertensive chronic kidney disease with stage 1 through stage 4 chronic kidney disease, or unspecified chronic kidney disease: Secondary | ICD-10-CM | POA: Diagnosis not present

## 2014-07-24 DIAGNOSIS — I25119 Atherosclerotic heart disease of native coronary artery with unspecified angina pectoris: Secondary | ICD-10-CM | POA: Diagnosis not present

## 2014-07-24 DIAGNOSIS — H409 Unspecified glaucoma: Secondary | ICD-10-CM | POA: Diagnosis not present

## 2014-07-24 DIAGNOSIS — G20 Parkinson's disease: Secondary | ICD-10-CM | POA: Diagnosis not present

## 2014-07-28 DIAGNOSIS — I25119 Atherosclerotic heart disease of native coronary artery with unspecified angina pectoris: Secondary | ICD-10-CM | POA: Diagnosis not present

## 2014-07-28 DIAGNOSIS — I480 Paroxysmal atrial fibrillation: Secondary | ICD-10-CM | POA: Diagnosis not present

## 2014-07-28 DIAGNOSIS — H409 Unspecified glaucoma: Secondary | ICD-10-CM | POA: Diagnosis not present

## 2014-07-28 DIAGNOSIS — G20 Parkinson's disease: Secondary | ICD-10-CM | POA: Diagnosis not present

## 2014-07-28 DIAGNOSIS — M1712 Unilateral primary osteoarthritis, left knee: Secondary | ICD-10-CM | POA: Diagnosis not present

## 2014-07-28 DIAGNOSIS — I129 Hypertensive chronic kidney disease with stage 1 through stage 4 chronic kidney disease, or unspecified chronic kidney disease: Secondary | ICD-10-CM | POA: Diagnosis not present

## 2014-07-31 DIAGNOSIS — I480 Paroxysmal atrial fibrillation: Secondary | ICD-10-CM | POA: Diagnosis not present

## 2014-07-31 DIAGNOSIS — M1712 Unilateral primary osteoarthritis, left knee: Secondary | ICD-10-CM | POA: Diagnosis not present

## 2014-07-31 DIAGNOSIS — I129 Hypertensive chronic kidney disease with stage 1 through stage 4 chronic kidney disease, or unspecified chronic kidney disease: Secondary | ICD-10-CM | POA: Diagnosis not present

## 2014-07-31 DIAGNOSIS — I25119 Atherosclerotic heart disease of native coronary artery with unspecified angina pectoris: Secondary | ICD-10-CM | POA: Diagnosis not present

## 2014-07-31 DIAGNOSIS — H409 Unspecified glaucoma: Secondary | ICD-10-CM | POA: Diagnosis not present

## 2014-07-31 DIAGNOSIS — G20 Parkinson's disease: Secondary | ICD-10-CM | POA: Diagnosis not present

## 2014-07-31 DIAGNOSIS — Z7901 Long term (current) use of anticoagulants: Secondary | ICD-10-CM | POA: Diagnosis not present

## 2014-08-20 ENCOUNTER — Telehealth: Payer: Self-pay | Admitting: Neurology

## 2014-08-20 NOTE — Telephone Encounter (Signed)
Pt son [name] called and wants to talk to you about his dad's memory please call [phone]

## 2014-08-20 NOTE — Telephone Encounter (Signed)
Left message for patients son [name] to return my call .

## 2014-08-21 DIAGNOSIS — D649 Anemia, unspecified: Secondary | ICD-10-CM | POA: Diagnosis not present

## 2014-08-21 DIAGNOSIS — I10 Essential (primary) hypertension: Secondary | ICD-10-CM | POA: Diagnosis not present

## 2014-09-15 DIAGNOSIS — H35371 Puckering of macula, right eye: Secondary | ICD-10-CM | POA: Diagnosis not present

## 2014-09-15 DIAGNOSIS — H11132 Conjunctival pigmentations, left eye: Secondary | ICD-10-CM | POA: Diagnosis not present

## 2014-09-21 DIAGNOSIS — E785 Hyperlipidemia, unspecified: Secondary | ICD-10-CM | POA: Diagnosis not present

## 2014-09-21 DIAGNOSIS — R269 Unspecified abnormalities of gait and mobility: Secondary | ICD-10-CM | POA: Diagnosis not present

## 2014-09-21 DIAGNOSIS — D696 Thrombocytopenia, unspecified: Secondary | ICD-10-CM | POA: Diagnosis not present

## 2014-09-21 DIAGNOSIS — I10 Essential (primary) hypertension: Secondary | ICD-10-CM | POA: Diagnosis not present

## 2014-09-21 DIAGNOSIS — G20 Parkinson's disease: Secondary | ICD-10-CM | POA: Diagnosis not present

## 2014-09-21 DIAGNOSIS — R740 Nonspecific elevation of levels of transaminase and lactic acid dehydrogenase [LDH]: Secondary | ICD-10-CM | POA: Diagnosis not present

## 2014-09-21 DIAGNOSIS — I2510 Atherosclerotic heart disease of native coronary artery without angina pectoris: Secondary | ICD-10-CM | POA: Diagnosis not present

## 2014-09-21 DIAGNOSIS — I480 Paroxysmal atrial fibrillation: Secondary | ICD-10-CM | POA: Diagnosis not present

## 2014-09-21 DIAGNOSIS — Z6827 Body mass index (BMI) 27.0-27.9, adult: Secondary | ICD-10-CM | POA: Diagnosis not present

## 2014-09-21 DIAGNOSIS — L84 Corns and callosities: Secondary | ICD-10-CM | POA: Diagnosis not present

## 2014-09-21 DIAGNOSIS — D649 Anemia, unspecified: Secondary | ICD-10-CM | POA: Diagnosis not present

## 2014-09-21 DIAGNOSIS — R358 Other polyuria: Secondary | ICD-10-CM | POA: Diagnosis not present

## 2014-10-05 ENCOUNTER — Encounter: Payer: Self-pay | Admitting: Neurology

## 2014-10-05 ENCOUNTER — Ambulatory Visit (INDEPENDENT_AMBULATORY_CARE_PROVIDER_SITE_OTHER): Payer: Medicare Other | Admitting: Neurology

## 2014-10-05 VITALS — BP 114/68 | HR 66 | Resp 16 | Ht 67.0 in | Wt 172.1 lb

## 2014-10-05 DIAGNOSIS — G20 Parkinson's disease: Secondary | ICD-10-CM | POA: Diagnosis not present

## 2014-10-05 DIAGNOSIS — F0280 Dementia in other diseases classified elsewhere without behavioral disturbance: Secondary | ICD-10-CM | POA: Diagnosis not present

## 2014-10-05 MED ORDER — CARBIDOPA-LEVODOPA 25-100 MG PO TABS: 1.5000 | ORAL_TABLET | Freq: Four times a day (QID) | ORAL | Status: DC
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
## 2014-10-05 NOTE — Patient Instructions (Signed)
1.  Increase carbidopa-levodopa to 1 and 1/2 tablets four times daily (8am, around 12 pm, 6pm and 9pm 2.  Follow up in 6-8 weeks to discuss cognition and memory

## 2014-10-30 ENCOUNTER — Encounter (HOSPITAL_COMMUNITY): Payer: Self-pay | Admitting: Emergency Medicine

## 2014-10-30 ENCOUNTER — Inpatient Hospital Stay (HOSPITAL_COMMUNITY)
Admission: EM | Admit: 2014-10-30 | Discharge: 2014-11-03 | DRG: 372 | Disposition: A | Discharge status: Skilled Nursing Facility | Payer: Medicare Other | Attending: Internal Medicine | Admitting: Internal Medicine

## 2014-10-30 DIAGNOSIS — Z8249 Family history of ischemic heart disease and other diseases of the circulatory system: Secondary | ICD-10-CM

## 2014-10-30 DIAGNOSIS — D649 Anemia, unspecified: Secondary | ICD-10-CM | POA: Diagnosis present

## 2014-10-30 DIAGNOSIS — Z6822 Body mass index (BMI) 22.0-22.9, adult: Secondary | ICD-10-CM | POA: Diagnosis not present

## 2014-10-30 DIAGNOSIS — I480 Paroxysmal atrial fibrillation: Secondary | ICD-10-CM | POA: Diagnosis not present

## 2014-10-30 DIAGNOSIS — M6281 Muscle weakness (generalized): Secondary | ICD-10-CM | POA: Diagnosis not present

## 2014-10-30 DIAGNOSIS — R1312 Dysphagia, oropharyngeal phase: Secondary | ICD-10-CM | POA: Diagnosis not present

## 2014-10-30 DIAGNOSIS — G3183 Dementia with Lewy bodies: Secondary | ICD-10-CM | POA: Diagnosis present

## 2014-10-30 DIAGNOSIS — Z96651 Presence of right artificial knee joint: Secondary | ICD-10-CM | POA: Diagnosis present

## 2014-10-30 DIAGNOSIS — Z85828 Personal history of other malignant neoplasm of skin: Secondary | ICD-10-CM

## 2014-10-30 DIAGNOSIS — M1712 Unilateral primary osteoarthritis, left knee: Secondary | ICD-10-CM | POA: Diagnosis present

## 2014-10-30 DIAGNOSIS — Z8673 Personal history of transient ischemic attack (TIA), and cerebral infarction without residual deficits: Secondary | ICD-10-CM | POA: Diagnosis not present

## 2014-10-30 DIAGNOSIS — Z5189 Encounter for other specified aftercare: Secondary | ICD-10-CM | POA: Diagnosis not present

## 2014-10-30 DIAGNOSIS — D696 Thrombocytopenia, unspecified: Secondary | ICD-10-CM | POA: Diagnosis present

## 2014-10-30 DIAGNOSIS — R627 Adult failure to thrive: Secondary | ICD-10-CM | POA: Diagnosis present

## 2014-10-30 DIAGNOSIS — R278 Other lack of coordination: Secondary | ICD-10-CM | POA: Diagnosis not present

## 2014-10-30 DIAGNOSIS — R6889 Other general symptoms and signs: Secondary | ICD-10-CM | POA: Diagnosis not present

## 2014-10-30 DIAGNOSIS — Z7901 Long term (current) use of anticoagulants: Secondary | ICD-10-CM | POA: Diagnosis not present

## 2014-10-30 DIAGNOSIS — I4891 Unspecified atrial fibrillation: Secondary | ICD-10-CM | POA: Diagnosis not present

## 2014-10-30 DIAGNOSIS — D6949 Other primary thrombocytopenia: Secondary | ICD-10-CM | POA: Diagnosis not present

## 2014-10-30 DIAGNOSIS — G20 Parkinson's disease: Secondary | ICD-10-CM | POA: Diagnosis not present

## 2014-10-30 DIAGNOSIS — A047 Enterocolitis due to Clostridium difficile: Principal | ICD-10-CM

## 2014-10-30 DIAGNOSIS — F329 Major depressive disorder, single episode, unspecified: Secondary | ICD-10-CM | POA: Diagnosis present

## 2014-10-30 DIAGNOSIS — I10 Essential (primary) hypertension: Secondary | ICD-10-CM | POA: Diagnosis not present

## 2014-10-30 DIAGNOSIS — E440 Moderate protein-calorie malnutrition: Secondary | ICD-10-CM | POA: Diagnosis present

## 2014-10-30 DIAGNOSIS — I2510 Atherosclerotic heart disease of native coronary artery without angina pectoris: Secondary | ICD-10-CM | POA: Diagnosis present

## 2014-10-30 DIAGNOSIS — R197 Diarrhea, unspecified: Secondary | ICD-10-CM | POA: Diagnosis not present

## 2014-10-30 DIAGNOSIS — G20A1 Parkinson's disease without dyskinesia, without mention of fluctuations: Secondary | ICD-10-CM | POA: Diagnosis present

## 2014-10-30 DIAGNOSIS — A0472 Enterocolitis due to Clostridium difficile, not specified as recurrent: Secondary | ICD-10-CM

## 2014-10-30 DIAGNOSIS — N400 Enlarged prostate without lower urinary tract symptoms: Secondary | ICD-10-CM | POA: Diagnosis present

## 2014-10-30 DIAGNOSIS — E785 Hyperlipidemia, unspecified: Secondary | ICD-10-CM | POA: Diagnosis present

## 2014-10-30 DIAGNOSIS — Z79899 Other long term (current) drug therapy: Secondary | ICD-10-CM | POA: Diagnosis not present

## 2014-10-30 DIAGNOSIS — N040 Nephrotic syndrome with minor glomerular abnormality: Secondary | ICD-10-CM | POA: Diagnosis not present

## 2014-10-30 DIAGNOSIS — R262 Difficulty in walking, not elsewhere classified: Secondary | ICD-10-CM | POA: Diagnosis not present

## 2014-10-30 LAB — COMPREHENSIVE METABOLIC PANEL
ALK PHOS: 58 U/L (ref 38–126)
ALT: 5 U/L — ABNORMAL LOW (ref 17–63)
AST: 29 U/L (ref 15–41)
Albumin: 4.1 g/dL (ref 3.5–5.0)
Anion gap: 9 (ref 5–15)
BUN: 21 mg/dL — ABNORMAL HIGH (ref 6–20)
CALCIUM: 9 mg/dL (ref 8.9–10.3)
CO2: 23 mmol/L (ref 22–32)
Chloride: 104 mmol/L (ref 101–111)
Creatinine, Ser: 1.04 mg/dL (ref 0.61–1.24)
GFR calc non Af Amer: >60 mL/min (ref >60)
Glucose, Bld: 103 mg/dL — ABNORMAL HIGH (ref 65–99)
Potassium: 3.8 mmol/L (ref 3.5–5.1)
Sodium: 136 mmol/L (ref 135–145)
Total Bilirubin: 0.9 mg/dL (ref 0.3–1.2)
Total Protein: 7.5 g/dL (ref 6.5–8.1)

## 2014-10-30 LAB — CBC WITH DIFFERENTIAL/PLATELET
BASOS PCT: 0 %
Basophils Absolute: 0 10*3/uL (ref 0.0–0.1)
EOS ABS: 0 10*3/uL (ref 0.0–0.7)
Eosinophils Relative: 0 %
HEMATOCRIT: 39.2 % (ref 39.0–52.0)
Hemoglobin: 13 g/dL (ref 13.0–17.0)
LYMPHS ABS: 0.7 10*3/uL (ref 0.7–4.0)
Lymphocytes Relative: 4 %
MCH: 30.5 pg (ref 26.0–34.0)
MCHC: 33.2 g/dL (ref 30.0–36.0)
MCV: 92 fL (ref 78.0–100.0)
Monocytes Absolute: 1.5 10*3/uL — ABNORMAL HIGH (ref 0.1–1.0)
Monocytes Relative: 9 %
NEUTROS PCT: 87 %
Neutro Abs: 15.1 10*3/uL — ABNORMAL HIGH (ref 1.7–7.7)
Platelets: 101 10*3/uL — ABNORMAL LOW (ref 150–400)
RBC: 4.26 MIL/uL (ref 4.22–5.81)
RDW: 14.5 % (ref 11.5–15.5)
WBC: 17.4 10*3/uL — AB (ref 4.0–10.5)

## 2014-10-30 LAB — URINALYSIS, ROUTINE W REFLEX MICROSCOPIC
Bilirubin Urine: NEGATIVE
GLUCOSE, UA: NEGATIVE mg/dL
HGB URINE DIPSTICK: NEGATIVE
Ketones, ur: 15 mg/dL — AB
Leukocytes, UA: NEGATIVE
Nitrite: NEGATIVE
Protein, ur: NEGATIVE mg/dL
SPECIFIC GRAVITY, URINE: 1.023 (ref 1.005–1.030)
Urobilinogen, UA: 0.2 mg/dL (ref 0.0–1.0)
pH: 5 (ref 5.0–8.0)

## 2014-10-30 LAB — LIPASE, BLOOD: Lipase: 22 U/L (ref 22–51)

## 2014-10-30 LAB — MAGNESIUM: MAGNESIUM: 1.8 mg/dL (ref 1.7–2.4)

## 2014-10-30 LAB — C DIFFICILE QUICK SCREEN W PCR REFLEX
C DIFFICILE (CDIFF) TOXIN: POSITIVE — AB
C DIFFICLE (CDIFF) ANTIGEN: POSITIVE — AB
C Diff interpretation: POSITIVE

## 2014-10-30 LAB — PROTIME-INR
INR: 1.57 — AB (ref 0.00–1.49)
Prothrombin Time: 18.8 seconds — ABNORMAL HIGH (ref 11.6–15.2)

## 2014-10-30 LAB — POC OCCULT BLOOD, ED: Fecal Occult Bld: NEGATIVE

## 2014-10-30 LAB — I-STAT CG4 LACTIC ACID, ED: Lactic Acid, Venous: 1.05 mmol/L (ref 0.5–2.0)

## 2014-10-30 MED ORDER — VANCOMYCIN 50 MG/ML ORAL SOLUTION
125.0000 mg | Freq: Four times a day (QID) | ORAL | Status: DC
  Administered 2014-10-30 – 2014-10-31 (×9): 125 mg via ORAL
  Filled 2014-10-30 (×13): qty 2.5

## 2014-10-30 MED ORDER — TAMSULOSIN HCL 0.4 MG PO CAPS
0.4000 mg | ORAL_CAPSULE | Freq: Every day | ORAL | Status: DC
  Administered 2014-10-30 – 2014-11-03 (×4): 0.4 mg via ORAL
  Filled 2014-10-30 (×5): qty 1

## 2014-10-30 MED ORDER — ACETAMINOPHEN 650 MG RE SUPP
650.0000 mg | Freq: Once | RECTAL | Status: AC
  Administered 2014-10-30: 650 mg via RECTAL
  Filled 2014-10-30: qty 1

## 2014-10-30 MED ORDER — KETOCONAZOLE 2 % EX CREA
1.0000 "application " | TOPICAL_CREAM | Freq: Every day | CUTANEOUS | Status: DC
  Administered 2014-10-30 – 2014-11-03 (×5): 1 via TOPICAL
  Filled 2014-10-30: qty 15

## 2014-10-30 MED ORDER — LATANOPROST 0.005 % OP SOLN
1.0000 [drp] | Freq: Every day | OPHTHALMIC | Status: DC
  Administered 2014-10-30 – 2014-11-02 (×4): 1 [drp] via OPHTHALMIC
  Filled 2014-10-30: qty 2.5

## 2014-10-30 MED ORDER — HYDROCORTISONE 1 % EX CREA
1.0000 "application " | TOPICAL_CREAM | Freq: Every day | CUTANEOUS | Status: DC
  Administered 2014-10-30 – 2014-11-03 (×5): 1 via TOPICAL
  Filled 2014-10-30: qty 28

## 2014-10-30 MED ORDER — RISAQUAD PO CAPS
1.0000 | ORAL_CAPSULE | Freq: Every day | ORAL | Status: DC
Start: 2014-10-30 — End: 2014-11-03
  Administered 2014-10-30 – 2014-11-03 (×5): 1 via ORAL
  Filled 2014-10-30 (×5): qty 1

## 2014-10-30 MED ORDER — SODIUM CHLORIDE 0.9 % IV SOLN
INTRAVENOUS | Status: DC
  Administered 2014-10-30: 06:00:00 via INTRAVENOUS

## 2014-10-30 MED ORDER — PAROXETINE HCL 10 MG PO TABS
10.0000 mg | ORAL_TABLET | Freq: Every day | ORAL | Status: DC
  Administered 2014-10-30 – 2014-11-03 (×5): 10 mg via ORAL
  Filled 2014-10-30 (×5): qty 1

## 2014-10-30 MED ORDER — AMLODIPINE BESYLATE 2.5 MG PO TABS
2.5000 mg | ORAL_TABLET | Freq: Every day | ORAL | Status: DC
  Administered 2014-10-30 – 2014-11-03 (×5): 2.5 mg via ORAL
  Filled 2014-10-30 (×5): qty 1

## 2014-10-30 MED ORDER — APIXABAN 2.5 MG PO TABS
2.5000 mg | ORAL_TABLET | Freq: Two times a day (BID) | ORAL | Status: DC
  Administered 2014-10-30 – 2014-11-03 (×9): 2.5 mg via ORAL
  Filled 2014-10-30 (×10): qty 1

## 2014-10-30 MED ORDER — POTASSIUM CHLORIDE IN NACL 40-0.9 MEQ/L-% IV SOLN
INTRAVENOUS | Status: DC
  Administered 2014-10-30 – 2014-10-31 (×3): 125 mL/h via INTRAVENOUS
  Administered 2014-10-31 – 2014-11-03 (×4): 100 mL/h via INTRAVENOUS
  Filled 2014-10-30 (×13): qty 1000

## 2014-10-30 MED ORDER — VANCOMYCIN 50 MG/ML ORAL SOLUTION
125.0000 mg | Freq: Four times a day (QID) | ORAL | Status: DC
Start: 2014-10-30 — End: 2014-10-30

## 2014-10-30 MED ORDER — CARBIDOPA-LEVODOPA 25-100 MG PO TABS
1.5000 | ORAL_TABLET | ORAL | Status: DC
  Administered 2014-10-30 – 2014-11-03 (×18): 1.5 via ORAL
  Filled 2014-10-30 (×21): qty 1.5

## 2014-10-30 MED ORDER — ACETAMINOPHEN 325 MG PO TABS: 650.0000 mg | ORAL_TABLET | Freq: Four times a day (QID) | ORAL | Status: DC | PRN
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
## 2014-10-30 NOTE — ED Notes (Signed)
Bed: DY51 Expected date:  Expected time:  Means of arrival:  Comments: 81 yo M  Hematuria, fever

## 2014-10-30 NOTE — ED Notes (Signed)
Critical Report  Patient is positive for toxigenic C Diff.  Primary RN Notified

## 2014-10-30 NOTE — Progress Notes (Signed)
81 year old male with a history of multiple medical problems including the following Coronary artery disease, stable  Hypertension, improved  Hyperlipidemia, on medications  Long-term (current) use of anticoagulants, with INR in therapeutic range today  OA (osteoarthritis) of knee, stable  Parkinson's disease, with gait disorder and risk for falls  Pneumonia, has finished antibiotics  Unstable gait, to get physical therapy  FTT (failure to thrive) in adult  Depression, on medications Anemia, mild Protein calorie malnutrition, moderate  Presents with diarrhea for the last 2 days, stool studies positive for C. difficile, patient started on by mouth vancomycin. History of C. difficile last year in 2015. Continue IV fluids, recheck labs tomorrow, anticipate discharge in one to 2 days

## 2014-10-30 NOTE — ED Notes (Signed)
Pt from home via EMS. Per pt, he has had diarrhea x 2 days and has a history of c diff. Pt states he has been feeling weak x 2 days as well. EMS reported that the pt's son stated the pt had blood in his urine today. However according to the pt there was blood in his stool, not his urine. Pt has a hx of altzheimer's but is a and o x 4 at time of assessment. Pt has complaints of abdominal pain, but can not point to a certain place where he is hurting. He rates his pain at a 3/10.
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
## 2014-10-31 LAB — CBC
HEMATOCRIT: 38.5 % — AB (ref 39.0–52.0)
HEMOGLOBIN: 12.2 g/dL — AB (ref 13.0–17.0)
MCH: 29.7 pg (ref 26.0–34.0)
MCHC: 31.7 g/dL (ref 30.0–36.0)
MCV: 93.7 fL (ref 78.0–100.0)
Platelets: 113 10*3/uL — ABNORMAL LOW (ref 150–400)
RBC: 4.11 MIL/uL — ABNORMAL LOW (ref 4.22–5.81)
RDW: 14.9 % (ref 11.5–15.5)
WBC: 19.2 10*3/uL — ABNORMAL HIGH (ref 4.0–10.5)

## 2014-10-31 LAB — COMPREHENSIVE METABOLIC PANEL
AST: 26 U/L (ref 15–41)
Albumin: 3 g/dL — ABNORMAL LOW (ref 3.5–5.0)
Alkaline Phosphatase: 52 U/L (ref 38–126)
Anion gap: 7 (ref 5–15)
BUN: 16 mg/dL (ref 6–20)
CHLORIDE: 108 mmol/L (ref 101–111)
CO2: 23 mmol/L (ref 22–32)
CREATININE: 1.06 mg/dL (ref 0.61–1.24)
Calcium: 8 mg/dL — ABNORMAL LOW (ref 8.9–10.3)
GFR calc Af Amer: >60 mL/min (ref >60)
GFR calc non Af Amer: >60 mL/min (ref >60)
Glucose, Bld: 104 mg/dL — ABNORMAL HIGH (ref 65–99)
POTASSIUM: 4.1 mmol/L (ref 3.5–5.1)
Sodium: 138 mmol/L (ref 135–145)
Total Bilirubin: 0.9 mg/dL (ref 0.3–1.2)
Total Protein: 5.9 g/dL — ABNORMAL LOW (ref 6.5–8.1)

## 2014-10-31 NOTE — Progress Notes (Signed)
[facility] PROGRESS NOTE  Henry Alvarez ATF:[id number] DOB: 08/25/1924 DOA: 10/30/2014 PCP: Sheela Stack, MD Interim summary: Henry Alvarez is a 81 y.o. male with h/o alzheimer's disease, C.Diff infection 1 year ago. Patient presents to the ED with c/o worsening diarrhea that onset last night. c diff positive. Started on vancomycin.  Assessment/Plan: 1. C diff colitis: - admitted to med surg.  - stool positive for c diff. Started on po vancomycin.   2. Leukocytosis: - normal lactic acid.  - trend wbc count.   3. Thrombocytopenia:  Improved from admission.    Code Status: full  Family Communication: none at bedside Disposition Plan: pending. Possible home in 1 to 2 days.    Consultants:  None   Procedures:  none   Antibiotics:  Oral vancomycin.   HPI/Subjective: nausea improved. Diarrhea .   Objective: Filed Vitals:   10/31/14 0610  BP: 156/75  Pulse: 65  Temp: 98.1 F (36.7 C)  Resp: 16    Intake/Output Summary (Last 24 hours) at 10/31/14 1326 Last data filed at 10/31/14 0850  Gross per 24 hour  Intake 1486.67 ml  Output     77 ml  Net 1409.67 ml   Filed Weights   10/30/14 0533  Weight: 73.6 kg (162 lb 4.1 oz)    Exam:   General:  Alert afebrile comfortable.   Cardiovascular:s1s2,   Respiratory: clear to auscultation, no wheezing or rhonchi.   Abdomen: soft non tender non distended bowel sounds heard.   Musculoskeletal: no pedal edema.   Data Reviewed: Basic Metabolic Panel:  Recent Labs Lab 10/30/14 0111 10/31/14 0500  NA 136 138  K 3.8 4.1  CL 104 108  CO2 23 23  GLUCOSE 103* 104*  BUN 21* 16  CREATININE 1.04 1.06  CALCIUM 9.0 8.0*  MG 1.8  --    Liver Function Tests:  Recent Labs Lab 10/30/14 0111 10/31/14 0500  AST 29 26  ALT 5* <5*  ALKPHOS 58 52  BILITOT 0.9 0.9  PROT 7.5 5.9*  ALBUMIN 4.1 3.0*    Recent Labs Lab 10/30/14 0111  LIPASE 22   No results for input(s): AMMONIA in the last  168 hours. CBC:  Recent Labs Lab 10/30/14 0111 10/31/14 0500  WBC 17.4* 19.2*  NEUTROABS 15.1*  --   HGB 13.0 12.2*  HCT 39.2 38.5*  MCV 92.0 93.7  PLT 101* 113*   Cardiac Enzymes: No results for input(s): CKTOTAL, CKMB, CKMBINDEX, TROPONINI in the last 168 hours. BNP (last 3 results) No results for input(s): BNP in the last 8760 hours.  ProBNP (last 3 results) No results for input(s): PROBNP in the last 8760 hours.  CBG: No results for input(s): GLUCAP in the last 168 hours.  Recent Results (from the past 240 hour(s))  C difficile quick scan w PCR reflex     Status: Abnormal   Collection Time: 10/30/14  2:16 AM  Result Value Ref Range Status   C Diff antigen POSITIVE (A) NEGATIVE Final   C Diff toxin POSITIVE (A) NEGATIVE Final   C Diff interpretation Positive for toxigenic C. difficile  Final    Comment: CRITICAL RESULT CALLED TO, READ BACK BY AND VERIFIED WITH: A WARD RN @ [phone] ON 10/30/14 BY C DAVIS      Studies: No results found.  Scheduled Meds: . acidophilus  1 capsule Oral Daily  . amLODipine  2.5 mg Oral Daily  . apixaban  2.5 mg Oral BID  . carbidopa-levodopa  1.5  tablet Oral 4 times per day  . hydrocortisone cream  1 application Topical Daily  . ketoconazole  1 application Topical Daily  . latanoprost  1 drop Both Eyes QHS  . PARoxetine  10 mg Oral Daily  . tamsulosin  0.4 mg Oral Daily  . vancomycin  125 mg Oral QID   Continuous Infusions: . 0.9 % NaCl with KCl 40 mEq / L 125 mL/hr (10/31/14 0757)    Principal Problem:   C. difficile colitis Active Problems:   Paroxysmal atrial fibrillation   Parkinson's disease    Time spent: 25 min    [facility] Pager [phone]. If 7PM-7AM, please contact night-coverage at [email or web address], password [facility] 10/31/2014, 1:26 PM  LOS: 1 day

## 2014-11-01 LAB — CBC
HEMATOCRIT: 36.6 % — AB (ref 39.0–52.0)
HEMOGLOBIN: 11.8 g/dL — AB (ref 13.0–17.0)
MCH: 30.3 pg (ref 26.0–34.0)
MCHC: 32.2 g/dL (ref 30.0–36.0)
MCV: 93.8 fL (ref 78.0–100.0)
Platelets: 114 10*3/uL — ABNORMAL LOW (ref 150–400)
RBC: 3.9 MIL/uL — ABNORMAL LOW (ref 4.22–5.81)
RDW: 14.9 % (ref 11.5–15.5)
WBC: 13.1 10*3/uL — ABNORMAL HIGH (ref 4.0–10.5)

## 2014-11-01 LAB — BASIC METABOLIC PANEL
Anion gap: 5 (ref 5–15)
BUN: 12 mg/dL (ref 6–20)
CHLORIDE: 109 mmol/L (ref 101–111)
CO2: 22 mmol/L (ref 22–32)
CREATININE: 0.88 mg/dL (ref 0.61–1.24)
Calcium: 8.1 mg/dL — ABNORMAL LOW (ref 8.9–10.3)
GFR calc non Af Amer: >60 mL/min (ref >60)
Glucose, Bld: 91 mg/dL (ref 65–99)
Potassium: 4.5 mmol/L (ref 3.5–5.1)
Sodium: 136 mmol/L (ref 135–145)

## 2014-11-01 MED ORDER — METRONIDAZOLE IN NACL 5-0.79 MG/ML-% IV SOLN
500.0000 mg | Freq: Three times a day (TID) | INTRAVENOUS | Status: DC
  Administered 2014-11-01 – 2014-11-03 (×6): 500 mg via INTRAVENOUS
  Filled 2014-11-01 (×7): qty 100

## 2014-11-01 MED ORDER — VANCOMYCIN 50 MG/ML ORAL SOLUTION
250.0000 mg | Freq: Four times a day (QID) | ORAL | Status: DC
  Administered 2014-11-01 – 2014-11-03 (×10): 250 mg via ORAL
  Filled 2014-11-01 (×12): qty 5

## 2014-11-01 MED ORDER — SACCHAROMYCES BOULARDII 250 MG PO CAPS
250.0000 mg | ORAL_CAPSULE | Freq: Two times a day (BID) | ORAL | Status: DC
  Administered 2014-11-01 – 2014-11-03 (×4): 250 mg via ORAL
  Filled 2014-11-01 (×8): qty 1

## 2014-11-01 NOTE — Progress Notes (Signed)
Rectal tube inadvertently removed while cleaning patient; rectal tube containing some stool but stool not collecting in collection bag. Stool leaking around insertion site. Rectal tube left out and discarded. Ardae Rn

## 2014-11-01 NOTE — Progress Notes (Signed)
[facility] PROGRESS NOTE  Henry Alvarez JSE:[id number] DOB: [birth year] DOA: 10/30/2014 PCP: Henry Stack, MD Interim summary: Henry Alvarez is a 81 y.o. male with h/o alzheimer's disease, C.Diff infection 1 year ago. Patient presents to the ED with c/o worsening diarrhea that onset last night. c diff positive. Started on vancomycin.    Assessment/Plan: 1. Severe C diff colitis: - admitted to med surg.  - stool positive for c diff. Continue by mouth vancomycin, also start the patient on IV Flagyl and florastor, if continues to have  abdominal pain, will obtain CT to rule out toxic megacolon  2. Leukocytosis:-Improving - normal lactic acid.  - trend wbc count.   3. Thrombocytopenia:  Improved from admission.    Code Status: full  Family Communication: none at bedside Disposition Plan: pending. Possible home in 1 to 2 days.    Consultants:  None   Procedures:  none   Antibiotics:  Oral vancomycin.   HPI/Subjective: Patient had 11 bowel movements last night, now has flexiseal  in place  Objective: Filed Vitals:   11/01/14 0500  BP: 173/77  Pulse: 62  Temp: 97.7 F (36.5 C)  Resp: 14    Intake/Output Summary (Last 24 hours) at 11/01/14 0955 Last data filed at 11/01/14 0600  Gross per 24 hour  Intake 3546.67 ml  Output    200 ml  Net 3346.67 ml   Filed Weights   10/30/14 0533  Weight: 73.6 kg (162 lb 4.1 oz)    Exam:   General:  Alert afebrile comfortable.   Cardiovascular:s1s2,   Respiratory: clear to auscultation, no wheezing or rhonchi.   Abdomed Mildly diffusely tendern ,  non distended bowel sounds heard.   Musculoskeletal: no pedal edema.   Data Reviewed: Basic Metabolic Panel:  Recent Labs Lab 10/30/14 0111 10/31/14 0500 11/01/14 0447  NA 136 138 136  K 3.8 4.1 4.5  CL 104 108 109  CO2 23 23 22   GLUCOSE 103* 104* 91  BUN 21* 16 12  CREATININE 1.04 1.06 0.88  CALCIUM 9.0 8.0* 8.1*  MG 1.8  --   --     Liver Function Tests:  Recent Labs Lab 10/30/14 0111 10/31/14 0500  AST 29 26  ALT 5* <5*  ALKPHOS 58 52  BILITOT 0.9 0.9  PROT 7.5 5.9*  ALBUMIN 4.1 3.0*    Recent Labs Lab 10/30/14 0111  LIPASE 22   No results for input(s): AMMONIA in the last 168 hours. CBC:  Recent Labs Lab 10/30/14 0111 10/31/14 0500 11/01/14 0447  WBC 17.4* 19.2* 13.1*  NEUTROABS 15.1*  --   --   HGB 13.0 12.2* 11.8*  HCT 39.2 38.5* 36.6*  MCV 92.0 93.7 93.8  PLT 101* 113* 114*   Cardiac Enzymes: No results for input(s): CKTOTAL, CKMB, CKMBINDEX, TROPONINI in the last 168 hours. BNP (last 3 results) No results for input(s): BNP in the last 8760 hours.  ProBNP (last 3 results) No results for input(s): PROBNP in the last 8760 hours.  CBG: No results for input(s): GLUCAP in the last 168 hours.  Recent Results (from the past 240 hour(s))  C difficile quick scan w PCR reflex     Status: Abnormal   Collection Time: 10/30/14  2:16 AM  Result Value Ref Range Status   C Diff antigen POSITIVE (A) NEGATIVE Final   C Diff toxin POSITIVE (A) NEGATIVE Final   C Diff interpretation Positive for toxigenic C. difficile  Final    Comment: CRITICAL RESULT  CALLED TO, READ BACK BY AND VERIFIED WITH: A WARD RN @ [phone] ON 10/30/14 BY C DAVIS      Studies: No results found.  Scheduled Meds: . acidophilus  1 capsule Oral Daily  . amLODipine  2.5 mg Oral Daily  . apixaban  2.5 mg Oral BID  . carbidopa-levodopa  1.5 tablet Oral 4 times per day  . hydrocortisone cream  1 application Topical Daily  . ketoconazole  1 application Topical Daily  . latanoprost  1 drop Both Eyes QHS  . PARoxetine  10 mg Oral Daily  . saccharomyces boulardii  250 mg Oral BID  . tamsulosin  0.4 mg Oral Daily  . vancomycin  250 mg Oral QID   Continuous Infusions: . 0.9 % NaCl with KCl 40 mEq / L 100 mL/hr (10/31/14 1829)    Principal Problem:   C. difficile colitis Active Problems:   Paroxysmal atrial  fibrillation   Parkinson's disease    Time spent: 25 min    [facility] Pager [phone]. If 7PM-7AM, please contact night-coverage at [email or web address], password [facility] 11/01/2014, 9:55 AM  LOS: 2 days

## 2014-11-01 NOTE — Clinical Social Work Note (Signed)
Clinical Social Work Assessment  Patient Details  Name: Henry Alvarez MRN: [id number] Date of Birth: Mar 04, 1924  Date of referral:  11/01/14               Reason for consult:  Facility Placement                Permission sought to share information with:  Chartered certified accountant granted to share information::  Yes, Verbal Permission Granted  Name::        Agency::     Relationship::     Contact Information:     Housing/Transportation Living arrangements for the past 2 months:  Single Family Home Source of Information:  Adult Children Patient Interpreter Needed:    Criminal Activity/Legal Involvement Pertinent to Current Situation/Hospitalization:    Significant Relationships:  Adult Children Lives with:  Adult Children Do you feel safe going back to the place where you live?    Need for family participation in patient care:  Yes (Comment)  Care giving concerns:  Pt's son has no concerns at this time   Facilities manager / plan:  CSW reviewed consult stating that pt's son would like to speak to CSW about placement. CSW spoke with pt's son regarding and encouraged him to discuss history and needs.  CSW will send pt information to SNF's   Employment status:  Retired Forensic scientist:  Medicare PT Recommendations:  Not assessed at this time Information / Referral to community resources:     Patient/Family's Response to care:  Pt's son discussed pt's previous snf experience and stated that pt would be willing to go to [place] or [place] for rehab.  Pt is living with son and son works all day so pt will require SNF at discharge.  Patient/Family's Understanding of and Emotional Response to Diagnosis, Current Treatment, and Prognosis:  Pt's son missed a call from MD yesterday and will be coming to hospital today to obtain clarity with regards to pt health.  He understands the need for pt rehab and stated that pt understood it as well.    Emotional  Assessment Appearance:    Attitude/Demeanor/Rapport:    Affect (typically observed):    Orientation:    Alcohol / Substance use:    Psych involvement (Current and /or in the community):     Discharge Needs  Concerns to be addressed:    Readmission within the last 30 days:    Current discharge risk:    Barriers to Discharge:  No Barriers Identified   Carlean Jews, LCSW 11/01/2014, 2:57 PM

## 2014-11-02 DIAGNOSIS — Z7901 Long term (current) use of anticoagulants: Secondary | ICD-10-CM

## 2014-11-02 DIAGNOSIS — N400 Enlarged prostate without lower urinary tract symptoms: Secondary | ICD-10-CM

## 2014-11-02 DIAGNOSIS — F0280 Dementia in other diseases classified elsewhere without behavioral disturbance: Secondary | ICD-10-CM

## 2014-11-02 DIAGNOSIS — R627 Adult failure to thrive: Secondary | ICD-10-CM

## 2014-11-02 DIAGNOSIS — I10 Essential (primary) hypertension: Secondary | ICD-10-CM

## 2014-11-02 DIAGNOSIS — D696 Thrombocytopenia, unspecified: Secondary | ICD-10-CM | POA: Diagnosis present

## 2014-11-02 LAB — COMPREHENSIVE METABOLIC PANEL
ALK PHOS: 43 U/L (ref 38–126)
ALT: <5 U/L — ABNORMAL LOW (ref 17–63)
ANION GAP: 4 — AB (ref 5–15)
AST: 25 U/L (ref 15–41)
Albumin: 2.8 g/dL — ABNORMAL LOW (ref 3.5–5.0)
BUN: 11 mg/dL (ref 6–20)
CALCIUM: 8.2 mg/dL — AB (ref 8.9–10.3)
CO2: 24 mmol/L (ref 22–32)
Chloride: 110 mmol/L (ref 101–111)
Creatinine, Ser: 0.82 mg/dL (ref 0.61–1.24)
GFR calc Af Amer: >60 mL/min (ref >60)
Glucose, Bld: 97 mg/dL (ref 65–99)
POTASSIUM: 4.6 mmol/L (ref 3.5–5.1)
Sodium: 138 mmol/L (ref 135–145)
TOTAL PROTEIN: 5.5 g/dL — AB (ref 6.5–8.1)
Total Bilirubin: 0.6 mg/dL (ref 0.3–1.2)

## 2014-11-02 LAB — CBC
HEMATOCRIT: 36.7 % — AB (ref 39.0–52.0)
Hemoglobin: 11.8 g/dL — ABNORMAL LOW (ref 13.0–17.0)
MCH: 29.9 pg (ref 26.0–34.0)
MCHC: 32.2 g/dL (ref 30.0–36.0)
MCV: 93.1 fL (ref 78.0–100.0)
Platelets: 117 10*3/uL — ABNORMAL LOW (ref 150–400)
RBC: 3.94 MIL/uL — ABNORMAL LOW (ref 4.22–5.81)
RDW: 14.6 % (ref 11.5–15.5)
WBC: 9.2 10*3/uL (ref 4.0–10.5)

## 2014-11-02 NOTE — Care Management Important Message (Signed)
Important Message  Patient Details IM Letter given to Kathy/Case Manager to deliver to [place] Message  Patient Details  Name: Henry Alvarez MRN: [id number] Date of Birth: 30-Oct-1924   Medicare Important Message Given:  Yes-second notification given    Camillo Flaming 11/02/2014, 12:49 PM Name: Henry Alvarez MRN: [id number] Date of Birth: 1924-03-13   Medicare Important Message Given:  Yes-second notification given    Camillo Flaming 11/02/2014, 12:49 PM

## 2014-11-02 NOTE — Care Management Note (Signed)
Case Management Note  Patient Details  Name: Henry Alvarez MRN: [id number] Date of Birth: Jun 19, 1924  Subjective/Objective:81 y/o m admitted w/c diff colitis. From home.PT-recc HHC/vs SNF. Will provide [place] agency list as resource, & await patient's choice.                   Action/Plan:d/c plan home vs SNF, monitor progress.   Expected Discharge Date:                 Expected Discharge Plan:  [place]  In-House Referral:  Clinical Social Work  Discharge planning Services  CM Consult  Post Acute Care Choice:    Choice offered to:     DME Arranged:    DME Agency:     HH Arranged:    [place] Agency:     Status of Service:  In process, will continue to follow  Medicare Important Message Given:  Yes-second notification given Date Medicare IM Given:    Medicare IM give by:    Date Additional Medicare IM Given:    Additional Medicare Important Message give by:     If discussed at [place] of Stay Meetings, dates discussed:    Additional Comments:  Dessa Phi, RN 11/02/2014, 8:01 PM

## 2014-11-02 NOTE — Progress Notes (Signed)
Progress Note   Henry Alvarez HQI:[id number] DOB: 10-Apr-1924 DOA: 10/30/2014 PCP: Sheela Stack, MD   Brief Narrative:   Henry Alvarez is an 81 y.o. male with a PMH of Alzheimer's disease, prior Clostridium difficile infection who was admitted 10/30/14 with 3 episodes of diarrhea associated with fever/chills. He denied any recent antibiotics use. C. difficile studies done on admission were positive.  Assessment/Plan:   Principal Problem:   C. difficile colitis with leukocytosis - Stool positive for C. difficile antigen and toxin, consistent with acute infection. - Symptoms improved on empiric oral vancomycin, IV Flagyl and probiotics. - WBC normalized.  Active Problems:    Thrombocytopenia - Mild. No signs of bleeding. Likely a side effect of Eliquis.    Failure to thrive - Discharge to SNF for rehabilitation when medically stable.    BPH - Continue Flomax.    Hypertension - Continue Norvasc.    Paroxysmal atrial fibrillation/long-term current use of anticoagulation - Heart sounds are regular with heart rate in the 60s. - Continue Eliquis.    Parkinson's disease dementia - Stable.    DVT Prophylaxis - Lovenox ordered.  Family Communication: No family at the bedside. Disposition Plan: SNF 11/03/14 if stable. Code Status:     Code Status Orders        Start     Ordered   10/30/14 0528  Full code   Continuous     10/30/14 0528    Advance Directive Documentation        Most Recent Value   Type of Advance Directive  Healthcare Power of Attorney, Living will   Pre-existing out of facility DNR order (yellow form or pink MOST form)     "MOST" Form in Place?          IV Access:    Peripheral IV   Procedures and diagnostic studies:   No results found.   Medical Consultants:    None.  Anti-Infectives:   Anti-infectives    Start     Dose/Rate Route Frequency Ordered Stop   11/01/14 1830  metroNIDAZOLE (FLAGYL) IVPB 500 mg     500  mg 100 mL/hr over 60 Minutes Intravenous 3 times per day 11/01/14 1752     11/01/14 1000  vancomycin (VANCOCIN) 50 mg/mL oral solution 250 mg     250 mg Oral 4 times daily 11/01/14 0835 11/12/14 2159   10/30/14 1000  vancomycin (VANCOCIN) 50 mg/mL oral solution 125 mg  Status:  Discontinued     125 mg Oral 4 times daily 10/30/14 0434 10/30/14 0524   10/30/14 0530  vancomycin (VANCOCIN) 50 mg/mL oral solution 125 mg  Status:  Discontinued     125 mg Oral 4 times daily 10/30/14 0524 11/01/14 1324      Subjective:   Henry Alvarez denies abdominal pain today.  Had one diarrhea stool so far today, but reports it is improving.  Says his appetite is improving.  Objective:    Filed Vitals:   11/01/14 1017 11/01/14 1352 11/01/14 2200 11/02/14 0704  BP: 171/76 150/78 147/76 144/76  Pulse:  60 68 63  Temp:  98.3 F (36.8 C) 98.9 F (37.2 C) 99 F (37.2 C)  TempSrc:  Oral Oral Axillary  Resp:  14 20 20   Height:      Weight:      SpO2:  98% 99% 100%    Intake/Output Summary (Last 24 hours) at 11/02/14 4010 Last data filed at 11/02/14 2725  Gross  per 24 hour  Intake 2860.01 ml  Output    950 ml  Net 1910.01 ml   Filed Weights   10/30/14 0533  Weight: 73.6 kg (162 lb 4.1 oz)    Exam: Gen:  NAD Cardiovascular:  RRR, No M/R/G Respiratory:  Lungs CTAB Gastrointestinal:  Abdomen soft, NT/ND, + BS Extremities:  No C/E/C   Data Reviewed:    Labs: Basic Metabolic Panel:  Recent Labs Lab 10/30/14 0111 10/31/14 0500 11/01/14 0447 11/02/14 0450  NA 136 138 136 138  K 3.8 4.1 4.5 4.6  CL 104 108 109 110  CO2 23 23 22 24   GLUCOSE 103* 104* 91 97  BUN 21* 16 12 11   CREATININE 1.04 1.06 0.88 0.82  CALCIUM 9.0 8.0* 8.1* 8.2*  MG 1.8  --   --   --    GFR Estimated Creatinine Clearance: 64.8 mL/min (by C-G formula based on Cr of 0.82). Liver Function Tests:  Recent Labs Lab 10/30/14 0111 10/31/14 0500 11/02/14 0450  AST 29 26 25   ALT 5* <5* <5*  ALKPHOS 58 52  43  BILITOT 0.9 0.9 0.6  PROT 7.5 5.9* 5.5*  ALBUMIN 4.1 3.0* 2.8*    Recent Labs Lab 10/30/14 0111  LIPASE 22   Coagulation profile  Recent Labs Lab 10/30/14 0111  INR 1.57*    CBC:  Recent Labs Lab 10/30/14 0111 10/31/14 0500 11/01/14 0447 11/02/14 0450  WBC 17.4* 19.2* 13.1* 9.2  NEUTROABS 15.1*  --   --   --   HGB 13.0 12.2* 11.8* 11.8*  HCT 39.2 38.5* 36.6* 36.7*  MCV 92.0 93.7 93.8 93.1  PLT 101* 113* 114* 117*   Sepsis Labs:  Recent Labs Lab 10/30/14 0111 10/30/14 0159 10/31/14 0500 11/01/14 0447 11/02/14 0450  WBC 17.4*  --  19.2* 13.1* 9.2  LATICACIDVEN  --  1.05  --   --   --    Microbiology Recent Results (from the past 240 hour(s))  C difficile quick scan w PCR reflex     Status: Abnormal   Collection Time: 10/30/14  2:16 AM  Result Value Ref Range Status   C Diff antigen POSITIVE (A) NEGATIVE Final   C Diff toxin POSITIVE (A) NEGATIVE Final   C Diff interpretation Positive for toxigenic C. difficile  Final    Comment: CRITICAL RESULT CALLED TO, READ BACK BY AND VERIFIED WITH: A WARD RN @ [phone] ON 10/30/14 BY C DAVIS      Medications:   . acidophilus  1 capsule Oral Daily  . amLODipine  2.5 mg Oral Daily  . apixaban  2.5 mg Oral BID  . carbidopa-levodopa  1.5 tablet Oral 4 times per day  . hydrocortisone cream  1 application Topical Daily  . ketoconazole  1 application Topical Daily  . latanoprost  1 drop Both Eyes QHS  . metronidazole  500 mg Intravenous 3 times per day  . PARoxetine  10 mg Oral Daily  . saccharomyces boulardii  250 mg Oral BID  . tamsulosin  0.4 mg Oral Daily  . vancomycin  250 mg Oral QID   Continuous Infusions: . 0.9 % NaCl with KCl 40 mEq / L 100 mL/hr (11/01/14 1307)    Time spent: 25 minutes.   LOS: 3 days   [facility] Pager [phone]. If unable to reach me by pager, please call my cell phone at [phone].  *Please refer to [email or web address], password [email or web address] to get updated schedule on  who will round  on this patient, as hospitalists switch teams weekly. If 7PM-7AM, please contact night-coverage at [email or web address], password [email or web address] for any overnight needs.  11/02/2014, 8:22 AM

## 2014-11-02 NOTE — Progress Notes (Signed)
CSW spoke with pt's son this am to assist with d/c planning. Pt's son has chosen [facility] [employer]. SNF is unable to admit if pt has a rectal tube. CSW will continue to follow to assist with d/c planning to SNF.  Werner Lean LCSW [phone]

## 2014-11-02 NOTE — Evaluation (Signed)
Physical Therapy Evaluation Patient Details Name: Henry Alvarez MRN: [id number] DOB: 1924/05/20 Today's Date: 11/02/2014   History of Present Illness  81 yo male adm 10/30/14 with C-diff; PMHx: CAD, bil TKAs, hx dementia  Clinical Impression  Pt admitted with above diagnosis. Pt currently with functional limitations due to the deficits listed below (see PT Problem List).  Pt will benefit from skilled PT to increase their independence and safety with mobility to allow discharge to the venue listed below.   Pt motivated to work with PT today, recommending SNF vs HHPT depending on progress; pt does have caregiver 4hrs per day at home but his son works (hours vary)     Follow Up Recommendations Home health PT;SNF    Equipment Recommendations  None recommended by PT    Recommendations for Other Services       Precautions / Restrictions Precautions Precautions: Fall      Mobility  Bed Mobility Overal bed mobility: Needs Assistance Bed Mobility: Supine to Sit     Supine to sit: Min guard     General bed mobility comments: cues for technique and self assist  Transfers Overall transfer level: Needs assistance Equipment used: Rolling walker (2 wheeled) Transfers: Sit to/from Stand           General transfer comment: cues for hand placement  and  safety  Ambulation/Gait Ambulation/Gait assistance: Min assist Ambulation Distance (Feet): 220 Feet Assistive device: Rolling walker (2 wheeled) Gait Pattern/deviations: Step-through pattern;Trunk flexed     General Gait Details: cues for RW distance from self, incr step length  Stairs            Wheelchair Mobility    Modified Rankin (Stroke Patients Only)       Balance Overall balance assessment: Needs assistance;History of Falls   Sitting balance-Leahy Scale: Good     Standing balance support: Bilateral upper extremity supported;During functional activity;No upper extremity supported Standing  balance-Leahy Scale: Fair                               Pertinent Vitals/Pain Pain Assessment: No/denies pain    Home Living Family/patient expects to be discharged to:: Private residence (vs SNF) Living Arrangements: Children (son) Available Help at Discharge: Personal care attendant Type of Home: House       Home Layout: One level Home Equipment: Environmental consultant - 2 wheels;Cane - single point Additional Comments: caregiver 4 hrs per day, assists with meals, light cleaning    Prior Function Level of Independence: Independent with assistive device(s)               Hand Dominance        Extremity/Trunk Assessment   Upper Extremity Assessment: Overall WFL for tasks assessed           Lower Extremity Assessment: Generalized weakness         Communication   Communication: No difficulties  Cognition Arousal/Alertness: Awake/alert Behavior During Therapy: WFL for tasks assessed/performed Overall Cognitive Status: Within Functional Limits for tasks assessed                      General Comments      Exercises        Assessment/Plan    PT Assessment Patient needs continued PT services  PT Diagnosis Difficulty walking   PT Problem List Decreased strength;Decreased range of motion;Decreased activity tolerance;Decreased balance;Decreased mobility  PT Treatment Interventions DME instruction;Gait  training;Functional mobility training;Therapeutic activities;Patient/family education;Therapeutic exercise;Balance training   PT Goals (Current goals can be found in the Care Plan section) Acute Rehab PT Goals Patient Stated Goal: wants to go home PT Goal Formulation: With patient Time For Goal Achievement: 11/09/14 Potential to Achieve Goals: Good    Frequency Min 3X/week   Barriers to discharge        Co-evaluation               End of Session Equipment Utilized During Treatment: Gait belt Activity Tolerance: Patient tolerated  treatment well Patient left: with call bell/phone within reach;in chair;with chair alarm set Nurse Communication: Mobility status         Time: 9357-0177 PT Time Calculation (min) (ACUTE ONLY): 41 min   Charges:   PT Evaluation $Initial PT Evaluation Tier I: 1 Procedure PT Treatments $Gait Training: 23-37 mins   PT G Codes:        WILLIAMS,TARA 11/06/14, 4:08 PM

## 2014-11-03 DIAGNOSIS — I480 Paroxysmal atrial fibrillation: Secondary | ICD-10-CM | POA: Diagnosis not present

## 2014-11-03 DIAGNOSIS — R1312 Dysphagia, oropharyngeal phase: Secondary | ICD-10-CM | POA: Diagnosis not present

## 2014-11-03 DIAGNOSIS — M179 Osteoarthritis of knee, unspecified: Secondary | ICD-10-CM | POA: Diagnosis not present

## 2014-11-03 DIAGNOSIS — F329 Major depressive disorder, single episode, unspecified: Secondary | ICD-10-CM | POA: Diagnosis not present

## 2014-11-03 DIAGNOSIS — N040 Nephrotic syndrome with minor glomerular abnormality: Secondary | ICD-10-CM | POA: Diagnosis not present

## 2014-11-03 DIAGNOSIS — Z5189 Encounter for other specified aftercare: Secondary | ICD-10-CM | POA: Diagnosis not present

## 2014-11-03 DIAGNOSIS — I4891 Unspecified atrial fibrillation: Secondary | ICD-10-CM | POA: Diagnosis not present

## 2014-11-03 DIAGNOSIS — G3183 Dementia with Lewy bodies: Secondary | ICD-10-CM | POA: Diagnosis not present

## 2014-11-03 DIAGNOSIS — R262 Difficulty in walking, not elsewhere classified: Secondary | ICD-10-CM | POA: Diagnosis not present

## 2014-11-03 DIAGNOSIS — R278 Other lack of coordination: Secondary | ICD-10-CM | POA: Diagnosis not present

## 2014-11-03 DIAGNOSIS — G20 Parkinson's disease: Secondary | ICD-10-CM | POA: Diagnosis not present

## 2014-11-03 DIAGNOSIS — N400 Enlarged prostate without lower urinary tract symptoms: Secondary | ICD-10-CM | POA: Diagnosis not present

## 2014-11-03 DIAGNOSIS — R5381 Other malaise: Secondary | ICD-10-CM | POA: Diagnosis not present

## 2014-11-03 DIAGNOSIS — R627 Adult failure to thrive: Secondary | ICD-10-CM | POA: Diagnosis not present

## 2014-11-03 DIAGNOSIS — E46 Unspecified protein-calorie malnutrition: Secondary | ICD-10-CM | POA: Diagnosis not present

## 2014-11-03 DIAGNOSIS — A047 Enterocolitis due to Clostridium difficile: Secondary | ICD-10-CM | POA: Diagnosis not present

## 2014-11-03 DIAGNOSIS — I10 Essential (primary) hypertension: Secondary | ICD-10-CM | POA: Diagnosis not present

## 2014-11-03 DIAGNOSIS — D6949 Other primary thrombocytopenia: Secondary | ICD-10-CM | POA: Diagnosis not present

## 2014-11-03 DIAGNOSIS — R1310 Dysphagia, unspecified: Secondary | ICD-10-CM | POA: Diagnosis not present

## 2014-11-03 DIAGNOSIS — M6281 Muscle weakness (generalized): Secondary | ICD-10-CM | POA: Diagnosis not present

## 2014-11-03 DIAGNOSIS — G4700 Insomnia, unspecified: Secondary | ICD-10-CM | POA: Diagnosis not present

## 2014-11-03 MED ORDER — TRAMADOL-ACETAMINOPHEN 37.5-325 MG PO TABS: 1.0000 | ORAL_TABLET | Freq: Four times a day (QID) | ORAL | Status: DC | PRN

## 2014-11-03 MED ORDER — VANCOMYCIN 50 MG/ML ORAL SOLUTION: 250.0000 mg | Freq: Four times a day (QID) | ORAL | Status: AC

## 2014-11-03 MED ORDER — ACETAMINOPHEN 325 MG PO TABS: 650.0000 mg | ORAL_TABLET | Freq: Four times a day (QID) | ORAL | Status: DC | PRN

## 2014-11-03 NOTE — Progress Notes (Signed)
Attempted to call report to [facility]. Was told patient would be going to Room 407. Call was transferred to nurse who would be accepting patient. Phone rang and rang, then busy signal. Called back and was transferred to supervising nurse, left voice mail.

## 2014-11-03 NOTE — Discharge Instructions (Signed)
Clostridium Difficile Infection °Clostridium difficile (C. difficile) is a bacteria found in the intestinal tract or colon. Under certain conditions, it causes diarrhea and sometimes severe disease. The severe form of the disease is known as pseudomembranous colitis (often called C. difficile colitis). This disease can damage the lining of the colon or cause the colon to become enlarged (toxic megacolon). °CAUSES °Your colon normally contains many different bacteria, including C. difficile. The balance of bacteria in your colon can change during illness. This is especially true when you take antibiotic medicine. Taking antibiotics may allow the C. difficile to grow, multiply excessively, and make a toxin that then causes illness. The elderly and people with certain medical conditions have a greater risk of getting C. difficile infections. °SYMPTOMS °· Watery diarrhea. °· Fever. °· Fatigue. °· Loss of appetite. °· Nausea. °· Abdominal swelling, pain, or tenderness. °· Dehydration. °DIAGNOSIS °Your symptoms may make your caregiver suspect a C. difficile infection, especially if you have used antibiotics in the preceding weeks. However, there are only 2 ways to know for certain whether you have a C. difficile infection: °· A lab test that finds the toxin in your stool. °· The specific appearance of an abnormality (pseudomembrane) in your colon. This can only be seen by doing a sigmoidoscopy or colonoscopy. These procedures involve passing an instrument through your rectum to look at the inside of your colon. °Your caregiver will help determine if these tests are necessary. °TREATMENT °· Most people are successfully treated with one of two specific antibiotics, usually given by mouth. Other antibiotics you are receiving are stopped if possible. °· Intravenous (IV) fluids and correction of electrolyte imbalance may be necessary. °· Rarely, surgery may be needed to remove the infected part of the intestines. °· Careful  hand washing by you and your caregivers is important to prevent the spread of infection. In the hospital, your caregivers may also put on gowns and gloves to prevent the spread of the C. difficile bacteria. Your room is also cleaned regularly with a solution containing bleach or a product that is known to kill C. difficile. °HOME CARE INSTRUCTIONS °· Drink enough fluids to keep your urine clear or pale yellow. Avoid milk, caffeine, and alcohol. °· Ask your caregiver for specific rehydration instructions. °· Try eating small, frequent meals rather than large meals. °· Take your antibiotics as directed. Finish them even if you start to feel better. °· Do not use medicines to slow diarrhea. This could delay healing or cause complications. °· Wash your hands thoroughly after using the bathroom and before preparing food. °· Make sure people who live with you wash their hands often, too. °· Carefully disinfect all surfaces with a product that contains chlorine bleach. °SEEK MEDICAL CARE IF: °· Diarrhea persists longer than expected or recurs after completing your course of antibiotic treatment for the C. difficile infection. °· You have trouble staying hydrated. °SEEK IMMEDIATE MEDICAL CARE IF: °· You develop a new fever. °· You have increasing abdominal pain or tenderness. °· There is blood in your stools, or your stools are dark black and tarry. °· You cannot hold down food or liquids. °MAKE SURE YOU: °· Understand these instructions. °· Will watch your condition. °· Will get help right away if you are not doing well or get worse. °Document Released: 11/02/2004 Document Revised: 06/09/2013 Document Reviewed: 07/01/2010 °ExitCare® Patient Information ©2015 [employer]. This information is not intended to replace advice given to you by your health care provider. Make sure you   discuss any questions you have with your health care provider. ° °

## 2014-11-03 NOTE — Discharge Summary (Signed)
Physician Discharge Summary  Henry Alvarez UUV:[id number] DOB: 1924/11/11 DOA: 10/30/2014  PCP: Sheela Stack, MD  Admit date: 10/30/2014 Discharge date: 11/03/2014   Recommendations for Outpatient Follow-Up:   1. Monitor closely for recurrent or worsening diarrhea.  Check electrolytes twice a week until diarrhea resolved.   Discharge Diagnosis:   Principal Problem:    C. difficile colitis Active Problems:    Hypertension    Paroxysmal atrial fibrillation    Long-term (current) use of anticoagulants    Benign prostate hyperplasia    Parkinson's disease    FTT (failure to thrive) in adult    Parkinson's disease dementia    Thrombocytopenia   Discharge disposition:  SNF: [facility].  Discharge Condition: Improved.  Diet recommendation: Low sodium, heart healthy.    History of Present Illness:   Henry Alvarez is an 81 y.o. male with a PMH of Alzheimer's disease, prior Clostridium difficile infection who was admitted 10/30/14 with 3 episodes of diarrhea associated with fever/chills. He denied any recent antibiotics use. C. difficile studies done on admission were positive.  Hospital Course by Problem:   Principal Problem:  C. difficile colitis with leukocytosis - Stool positive for C. difficile antigen and toxin, consistent with acute infection. - Symptoms improved on empiric oral vancomycin, IV Flagyl and probiotics. - WBC normalized. Diarrhea improved at D/C. - D/C on oral vancomycin (2 week course total) and probiotics.  Active Problems:   Thrombocytopenia - Mild. No signs of bleeding. Likely a side effect of Eliquis.   Failure to thrive - Discharge to SNF for rehabilitation.   BPH - Continue Flomax.   Hypertension - Continue Norvasc.   Paroxysmal atrial fibrillation/long-term current use of anticoagulation - Heart sounds are regular with heart rate in the 60s. - Continue Eliquis.   Parkinson's disease dementia -  Stable.    Medical Consultants:    None.   Discharge Exam:   Filed Vitals:   11/03/14 0534  BP: 162/72  Pulse: 89  Temp: 98.7 F (37.1 C)  Resp: 18   Filed Vitals:   11/02/14 0704 11/02/14 1412 11/02/14 1956 11/03/14 0534  BP: 144/76 131/76 173/76 162/72  Pulse: 63 74 74 89  Temp: 99 F (37.2 C) 98.8 F (37.1 C) 98.6 F (37 C) 98.7 F (37.1 C)  TempSrc: Axillary  Oral Oral  Resp: 20 16 18 18   Height:      Weight:      SpO2: 100% 100% 100% 100%    Gen:  NAD Cardiovascular:  RRR, No M/R/G Respiratory: Lungs CTAB Gastrointestinal: Abdomen soft, NT/ND with normal active bowel sounds. Extremities: No C/E/C   The results of significant diagnostics from this hospitalization (including imaging, microbiology, ancillary and laboratory) are listed below for reference.     Procedures and Diagnostic Studies:      Labs:   Basic Metabolic Panel:  Recent Labs Lab 10/30/14 0111 10/31/14 0500 11/01/14 0447 11/02/14 0450  NA 136 138 136 138  K 3.8 4.1 4.5 4.6  CL 104 108 109 110  CO2 23 23 22 24   GLUCOSE 103* 104* 91 97  BUN 21* 16 12 11   CREATININE 1.04 1.06 0.88 0.82  CALCIUM 9.0 8.0* 8.1* 8.2*  MG 1.8  --   --   --    GFR Estimated Creatinine Clearance: 64.8 mL/min (by C-G formula based on Cr of 0.82). Liver Function Tests:  Recent Labs Lab 10/30/14 0111 10/31/14 0500 11/02/14 0450  AST 29 26 25   ALT 5* <  5* <5*  ALKPHOS 58 52 43  BILITOT 0.9 0.9 0.6  PROT 7.5 5.9* 5.5*  ALBUMIN 4.1 3.0* 2.8*    Recent Labs Lab 10/30/14 0111  LIPASE 22   Coagulation profile  Recent Labs Lab 10/30/14 0111  INR 1.57*    CBC:  Recent Labs Lab 10/30/14 0111 10/31/14 0500 11/01/14 0447 11/02/14 0450  WBC 17.4* 19.2* 13.1* 9.2  NEUTROABS 15.1*  --   --   --   HGB 13.0 12.2* 11.8* 11.8*  HCT 39.2 38.5* 36.6* 36.7*  MCV 92.0 93.7 93.8 93.1  PLT 101* 113* 114* 117*   Microbiology Recent Results (from the past 240 hour(s))  C difficile quick  scan w PCR reflex     Status: Abnormal   Collection Time: 10/30/14  2:16 AM  Result Value Ref Range Status   C Diff antigen POSITIVE (A) NEGATIVE Final   C Diff toxin POSITIVE (A) NEGATIVE Final   C Diff interpretation Positive for toxigenic C. difficile  Final    Comment: CRITICAL RESULT CALLED TO, READ BACK BY AND VERIFIED WITH: A WARD RN @ [phone] ON 10/30/14 BY C DAVIS      Discharge Instructions:   Discharge Instructions    Call MD for:  extreme fatigue    Complete by:  As directed      Call MD for:  severe uncontrolled pain    Complete by:  As directed      Call MD for:  temperature >100.4    Complete by:  As directed      Call MD for:    Complete by:  As directed   Worsening diarrhea or abdominal pain.     Diet - low sodium heart healthy    Complete by:  As directed      Increase activity slowly    Complete by:  As directed      Walk with assistance    Complete by:  As directed      Walker     Complete by:  As directed             Medication List    TAKE these medications        acetaminophen 325 MG tablet  Commonly known as:  TYLENOL  Take 2 tablets (650 mg total) by mouth every 6 (six) hours as needed for fever.     acidophilus Caps capsule  Take 1 capsule by mouth daily.     amLODipine 2.5 MG tablet  Commonly known as:  NORVASC  Take 2.5 mg by mouth every morning.     carbidopa-levodopa 25-100 MG per tablet  Commonly known as:  SINEMET IR  Take 1.5 tablets by mouth 4 (four) times daily. Take 1.tabs at 18am, 1tab at 12-1pm, 1tab at 5pm and 1tab at 9pm     ELIQUIS 2.5 MG Tabs tablet  Generic drug:  apixaban  Take 2.5 mg by mouth 2 (two) times daily.     hydrocortisone 2.5 % cream  Apply 1 application topically daily. To face and nose     ketoconazole 2 % cream  Commonly known as:  NIZORAL  Apply 1 application topically daily. Face and nose     latanoprost 0.005 % ophthalmic solution  Commonly known as:  XALATAN  Place 1 drop into both eyes at  bedtime.     Melatonin 3 MG Tabs  Take 3-6 mg by mouth at bedtime.     multivitamin with minerals Tabs tablet  Take 1  tablet by mouth daily.     PARoxetine 10 MG tablet  Commonly known as:  PAXIL  Take 1 tablet (10 mg total) by mouth daily.     tamsulosin 0.4 MG Caps capsule  Commonly known as:  FLOMAX  Take 0.4 mg by mouth daily.     traMADol-acetaminophen 37.5-325 MG per tablet  Commonly known as:  ULTRACET  Take 1 tablet by mouth every 6 (six) hours as needed.     vancomycin 50 mg/mL oral solution  Commonly known as:  VANCOCIN  Take 5 mLs (250 mg total) by mouth 4 (four) times daily.           Follow-up Information    Schedule an appointment as soon as possible for a visit with Sheela Stack, MD.   Specialty:  Endocrinology   Why:  After you are released from rehab for post discharge follow up.   Contact information:   [address] [place] [address] [phone]        Time coordinating discharge: 35 minutes.  Signed:  RAMA,CHRISTINA  Pager [phone] [facility] 11/03/2014, 9:58 AM

## 2014-11-03 NOTE — Clinical Social Work Placement (Signed)
   CLINICAL SOCIAL WORK PLACEMENT  NOTE  Date:  11/03/2014  Patient Details  Name: Henry Alvarez MRN: [id number] Date of Birth: 1924/07/12  Clinical Social Work is seeking post-discharge placement for this patient at the [place] level of care (*CSW will initial, date and re-position this form in  chart as items are completed):  Yes   Patient/family provided with [place] Work Department's list of facilities offering this level of care within the geographic area requested by the patient (or if unable, by the patient's family).  Yes   Patient/family informed of their freedom to choose among providers that offer the needed level of care, that participate in Medicare, Medicaid or managed care program needed by the patient, have an available bed and are willing to accept the patient.  Yes   Patient/family informed of [facility]'s ownership interest in [facility] and [facility], as well as of the fact that they are under no obligation to receive care at these facilities.  PASRR submitted to EDS on       PASRR number received on       Existing PASRR number confirmed on 11/01/14     FL2 transmitted to all facilities in geographic area requested by pt/family on 11/01/14     FL2 transmitted to all facilities within larger geographic area on       Patient informed that his/her managed care company has contracts with or will negotiate with certain facilities, including the following:        Yes   Patient/family informed of bed offers received.  Patient chooses bed at [facility]     Physician recommends and patient chooses bed at      Patient to be transferred to [facility] on 11/03/14.  Patient to be transferred to facility by [facility]     Patient family notified on 11/03/14 of transfer.  Name of family member notified:  SON / DAUGHTER     PHYSICIAN       Additional Comment: Pt / family are in agreement with d/c to [facility]  today. [facility] transport is required. Pt / family are aware out of pocket costs may be associated with [facility] transport. NSG reviewed d/c summary, scripts, avs. Scripts included in d/c packet. D/C Summary sent to SNF for review prior to d/c.    _______________________________________________ Luretha Rued, LCSW  [phone] 11/03/2014, 3:58 PM

## 2014-11-04 ENCOUNTER — Non-Acute Institutional Stay (SKILLED_NURSING_FACILITY): Payer: Medicare Other | Admitting: Adult Health

## 2014-11-04 DIAGNOSIS — I10 Essential (primary) hypertension: Secondary | ICD-10-CM

## 2014-11-04 DIAGNOSIS — A0472 Enterocolitis due to Clostridium difficile, not specified as recurrent: Secondary | ICD-10-CM

## 2014-11-04 DIAGNOSIS — N400 Enlarged prostate without lower urinary tract symptoms: Secondary | ICD-10-CM

## 2014-11-04 DIAGNOSIS — M1710 Unilateral primary osteoarthritis, unspecified knee: Secondary | ICD-10-CM

## 2014-11-04 DIAGNOSIS — G20 Parkinson's disease: Secondary | ICD-10-CM

## 2014-11-04 DIAGNOSIS — R5381 Other malaise: Secondary | ICD-10-CM

## 2014-11-04 DIAGNOSIS — F329 Major depressive disorder, single episode, unspecified: Secondary | ICD-10-CM

## 2014-11-04 DIAGNOSIS — F32A Depression, unspecified: Secondary | ICD-10-CM

## 2014-11-04 DIAGNOSIS — M179 Osteoarthritis of knee, unspecified: Secondary | ICD-10-CM | POA: Diagnosis not present

## 2014-11-04 DIAGNOSIS — G20A1 Parkinson's disease without dyskinesia, without mention of fluctuations: Secondary | ICD-10-CM

## 2014-11-04 DIAGNOSIS — G4700 Insomnia, unspecified: Secondary | ICD-10-CM | POA: Diagnosis not present

## 2014-11-04 DIAGNOSIS — A047 Enterocolitis due to Clostridium difficile: Secondary | ICD-10-CM | POA: Diagnosis not present

## 2014-11-04 DIAGNOSIS — I480 Paroxysmal atrial fibrillation: Secondary | ICD-10-CM

## 2014-11-09 ENCOUNTER — Non-Acute Institutional Stay (SKILLED_NURSING_FACILITY): Payer: Medicare Other | Admitting: Internal Medicine

## 2014-11-09 DIAGNOSIS — E46 Unspecified protein-calorie malnutrition: Secondary | ICD-10-CM | POA: Diagnosis not present

## 2014-11-09 DIAGNOSIS — A047 Enterocolitis due to Clostridium difficile: Secondary | ICD-10-CM | POA: Diagnosis not present

## 2014-11-09 DIAGNOSIS — M1710 Unilateral primary osteoarthritis, unspecified knee: Secondary | ICD-10-CM

## 2014-11-09 DIAGNOSIS — R1310 Dysphagia, unspecified: Secondary | ICD-10-CM | POA: Diagnosis not present

## 2014-11-09 DIAGNOSIS — F329 Major depressive disorder, single episode, unspecified: Secondary | ICD-10-CM

## 2014-11-09 DIAGNOSIS — A0472 Enterocolitis due to Clostridium difficile, not specified as recurrent: Secondary | ICD-10-CM

## 2014-11-09 DIAGNOSIS — I10 Essential (primary) hypertension: Secondary | ICD-10-CM

## 2014-11-09 DIAGNOSIS — G20 Parkinson's disease: Secondary | ICD-10-CM

## 2014-11-09 DIAGNOSIS — M179 Osteoarthritis of knee, unspecified: Secondary | ICD-10-CM

## 2014-11-09 DIAGNOSIS — N400 Enlarged prostate without lower urinary tract symptoms: Secondary | ICD-10-CM

## 2014-11-09 DIAGNOSIS — F32A Depression, unspecified: Secondary | ICD-10-CM

## 2014-11-09 DIAGNOSIS — I480 Paroxysmal atrial fibrillation: Secondary | ICD-10-CM | POA: Diagnosis not present

## 2014-11-09 DIAGNOSIS — R5381 Other malaise: Secondary | ICD-10-CM

## 2014-11-09 DIAGNOSIS — G20A1 Parkinson's disease without dyskinesia, without mention of fluctuations: Secondary | ICD-10-CM
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
## 2014-11-16 LAB — BASIC METABOLIC PANEL
Creatinine: 1.1 mg/dL (ref 0.6–1.3)
Glucose: 85 mg/dL
Potassium: 4.6 mmol/L (ref 3.4–5.3)
SODIUM: 141 mmol/L (ref 137–147)

## 2014-11-16 LAB — HEPATIC FUNCTION PANEL
ALK PHOS: 50 U/L (ref 25–125)
ALT: 12 U/L (ref 10–40)
AST: 25 U/L (ref 14–40)
BILIRUBIN, TOTAL: 0.5 mg/dL

## 2014-11-16 LAB — CBC AND DIFFERENTIAL
HEMATOCRIT: 35 % — AB (ref 41–53)
HEMOGLOBIN: 11 g/dL — AB (ref 13.5–17.5)
Neutrophils Absolute: 3 /uL
Platelets: 162 10*3/uL (ref 150–399)
WBC: 5 10^3/mL

## 2014-11-19 ENCOUNTER — Non-Acute Institutional Stay (SKILLED_NURSING_FACILITY): Payer: Medicare Other | Admitting: Adult Health

## 2014-11-19 ENCOUNTER — Encounter: Payer: Self-pay | Admitting: Adult Health

## 2014-11-19 DIAGNOSIS — G4700 Insomnia, unspecified: Secondary | ICD-10-CM

## 2014-11-19 DIAGNOSIS — G20 Parkinson's disease: Secondary | ICD-10-CM

## 2014-11-19 DIAGNOSIS — F329 Major depressive disorder, single episode, unspecified: Secondary | ICD-10-CM

## 2014-11-19 DIAGNOSIS — F32A Depression, unspecified: Secondary | ICD-10-CM

## 2014-11-19 DIAGNOSIS — R1310 Dysphagia, unspecified: Secondary | ICD-10-CM | POA: Diagnosis not present

## 2014-11-19 DIAGNOSIS — I480 Paroxysmal atrial fibrillation: Secondary | ICD-10-CM

## 2014-11-19 DIAGNOSIS — M1710 Unilateral primary osteoarthritis, unspecified knee: Secondary | ICD-10-CM

## 2014-11-19 DIAGNOSIS — N400 Enlarged prostate without lower urinary tract symptoms: Secondary | ICD-10-CM | POA: Diagnosis not present

## 2014-11-19 DIAGNOSIS — M179 Osteoarthritis of knee, unspecified: Secondary | ICD-10-CM | POA: Diagnosis not present
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
## 2014-11-20 ENCOUNTER — Telehealth: Payer: Self-pay | Admitting: Neurology

## 2014-11-20 NOTE — Telephone Encounter (Signed)
Tanya from [facility] called in regards to speech therapy for PT/Dawn [phone]

## 2014-11-20 NOTE — Telephone Encounter (Signed)
Attempted to call Tanya.  No answer and no voicemail.

## 2014-11-23 ENCOUNTER — Ambulatory Visit (INDEPENDENT_AMBULATORY_CARE_PROVIDER_SITE_OTHER): Payer: Medicare Other | Admitting: Neurology

## 2014-11-23 ENCOUNTER — Telehealth: Payer: Self-pay | Admitting: Neurology

## 2014-11-23 ENCOUNTER — Encounter: Payer: Self-pay | Admitting: Neurology

## 2014-11-23 VITALS — BP 122/72 | HR 62 | Ht 67.0 in | Wt 172.0 lb

## 2014-11-23 DIAGNOSIS — F0280 Dementia in other diseases classified elsewhere without behavioral disturbance: Secondary | ICD-10-CM

## 2014-11-23 DIAGNOSIS — G20 Parkinson's disease: Secondary | ICD-10-CM

## 2014-11-23 MED ORDER — DONEPEZIL HCL 5 MG PO TABS: 5.0000 mg | ORAL_TABLET | Freq: Every day | ORAL | Status: DC

## 2014-11-23 NOTE — Telephone Encounter (Signed)
Called Tanya back and she was checking to make sure that Dr. Tomi Likens would be following patient for his ST for Parkinson's.  V/O given.
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
## 2014-11-23 NOTE — Telephone Encounter (Signed)
Tanya/ from [facility] concerning pt's Speech therapy appt

## 2014-11-23 NOTE — Progress Notes (Signed)
Note routed

## 2014-11-23 NOTE — Telephone Encounter (Signed)
See next note

## 2014-11-23 NOTE — Patient Instructions (Signed)
1.  Continue Sinemet 1.5 tablets at 8am, 1 tablet at 12 to 1 pm, 1 tablet at 6 pm and 1 tablet at bedtime 2.  We will start donepezil (Aricept) 5mg  daily for four weeks.  If you are tolerating the medication, then after four weeks, we will increase the dose to 10mg  daily.  Side effects include nausea, vomiting, diarrhea, vivid dreams, and muscle cramps.  Please call the clinic if you experience any of these symptoms. 3.  Follow up in 3 months.

## 2014-12-01 DIAGNOSIS — M1712 Unilateral primary osteoarthritis, left knee: Secondary | ICD-10-CM | POA: Diagnosis not present

## 2014-12-01 DIAGNOSIS — E46 Unspecified protein-calorie malnutrition: Secondary | ICD-10-CM | POA: Diagnosis not present

## 2014-12-01 DIAGNOSIS — Z7901 Long term (current) use of anticoagulants: Secondary | ICD-10-CM | POA: Diagnosis not present

## 2014-12-01 DIAGNOSIS — R1310 Dysphagia, unspecified: Secondary | ICD-10-CM | POA: Diagnosis not present

## 2014-12-01 DIAGNOSIS — D696 Thrombocytopenia, unspecified: Secondary | ICD-10-CM | POA: Diagnosis not present

## 2014-12-01 DIAGNOSIS — N189 Chronic kidney disease, unspecified: Secondary | ICD-10-CM | POA: Diagnosis not present

## 2014-12-01 DIAGNOSIS — G20 Parkinson's disease: Secondary | ICD-10-CM | POA: Diagnosis not present

## 2014-12-01 DIAGNOSIS — Z9181 History of falling: Secondary | ICD-10-CM | POA: Diagnosis not present

## 2014-12-01 DIAGNOSIS — G309 Alzheimer's disease, unspecified: Secondary | ICD-10-CM | POA: Diagnosis not present

## 2014-12-01 DIAGNOSIS — H409 Unspecified glaucoma: Secondary | ICD-10-CM | POA: Diagnosis not present

## 2014-12-01 DIAGNOSIS — Z87440 Personal history of urinary (tract) infections: Secondary | ICD-10-CM | POA: Diagnosis not present

## 2014-12-01 DIAGNOSIS — F329 Major depressive disorder, single episode, unspecified: Secondary | ICD-10-CM | POA: Diagnosis not present

## 2014-12-01 DIAGNOSIS — I480 Paroxysmal atrial fibrillation: Secondary | ICD-10-CM | POA: Diagnosis not present

## 2014-12-01 DIAGNOSIS — I129 Hypertensive chronic kidney disease with stage 1 through stage 4 chronic kidney disease, or unspecified chronic kidney disease: Secondary | ICD-10-CM | POA: Diagnosis not present

## 2014-12-01 DIAGNOSIS — I2510 Atherosclerotic heart disease of native coronary artery without angina pectoris: Secondary | ICD-10-CM | POA: Diagnosis not present

## 2014-12-01 DIAGNOSIS — Z8781 Personal history of (healed) traumatic fracture: Secondary | ICD-10-CM | POA: Diagnosis not present

## 2014-12-06 ENCOUNTER — Emergency Department (HOSPITAL_COMMUNITY)
Admission: EM | Admit: 2014-12-06 | Discharge: 2014-12-07 | Disposition: A | Discharge status: Home / Self Care | Payer: Medicare Other | Attending: Emergency Medicine | Admitting: Emergency Medicine

## 2014-12-06 ENCOUNTER — Encounter (HOSPITAL_COMMUNITY): Payer: Self-pay

## 2014-12-06 ENCOUNTER — Ambulatory Visit (INDEPENDENT_AMBULATORY_CARE_PROVIDER_SITE_OTHER): Payer: Medicare Other | Admitting: Urgent Care

## 2014-12-06 VITALS — BP 130/70 | HR 57 | Temp 97.4°F | Resp 18 | Ht 64.5 in | Wt 169.4 lb

## 2014-12-06 DIAGNOSIS — R001 Bradycardia, unspecified: Secondary | ICD-10-CM | POA: Diagnosis not present

## 2014-12-06 DIAGNOSIS — R197 Diarrhea, unspecified: Secondary | ICD-10-CM

## 2014-12-06 DIAGNOSIS — N189 Chronic kidney disease, unspecified: Secondary | ICD-10-CM | POA: Insufficient documentation

## 2014-12-06 DIAGNOSIS — A0472 Enterocolitis due to Clostridium difficile, not specified as recurrent: Secondary | ICD-10-CM

## 2014-12-06 DIAGNOSIS — J45909 Unspecified asthma, uncomplicated: Secondary | ICD-10-CM | POA: Insufficient documentation

## 2014-12-06 DIAGNOSIS — Z8619 Personal history of other infectious and parasitic diseases: Secondary | ICD-10-CM | POA: Diagnosis not present

## 2014-12-06 DIAGNOSIS — Z87440 Personal history of urinary (tract) infections: Secondary | ICD-10-CM | POA: Insufficient documentation

## 2014-12-06 DIAGNOSIS — I2510 Atherosclerotic heart disease of native coronary artery without angina pectoris: Secondary | ICD-10-CM | POA: Diagnosis not present

## 2014-12-06 DIAGNOSIS — I129 Hypertensive chronic kidney disease with stage 1 through stage 4 chronic kidney disease, or unspecified chronic kidney disease: Secondary | ICD-10-CM | POA: Insufficient documentation

## 2014-12-06 DIAGNOSIS — Z79899 Other long term (current) drug therapy: Secondary | ICD-10-CM | POA: Diagnosis not present

## 2014-12-06 DIAGNOSIS — A047 Enterocolitis due to Clostridium difficile: Secondary | ICD-10-CM | POA: Insufficient documentation

## 2014-12-06 DIAGNOSIS — Z8781 Personal history of (healed) traumatic fracture: Secondary | ICD-10-CM | POA: Diagnosis not present

## 2014-12-06 DIAGNOSIS — M1990 Unspecified osteoarthritis, unspecified site: Secondary | ICD-10-CM | POA: Diagnosis not present

## 2014-12-06 DIAGNOSIS — Z7901 Long term (current) use of anticoagulants: Secondary | ICD-10-CM | POA: Diagnosis not present

## 2014-12-06 DIAGNOSIS — I10 Essential (primary) hypertension: Secondary | ICD-10-CM | POA: Diagnosis not present

## 2014-12-06 DIAGNOSIS — Z8639 Personal history of other endocrine, nutritional and metabolic disease: Secondary | ICD-10-CM | POA: Diagnosis not present

## 2014-12-06 DIAGNOSIS — Z9889 Other specified postprocedural states: Secondary | ICD-10-CM | POA: Insufficient documentation

## 2014-12-06 DIAGNOSIS — R195 Other fecal abnormalities: Secondary | ICD-10-CM

## 2014-12-06 DIAGNOSIS — N400 Enlarged prostate without lower urinary tract symptoms: Secondary | ICD-10-CM | POA: Diagnosis not present

## 2014-12-06 DIAGNOSIS — Z7902 Long term (current) use of antithrombotics/antiplatelets: Secondary | ICD-10-CM | POA: Diagnosis not present

## 2014-12-06 DIAGNOSIS — Z8673 Personal history of transient ischemic attack (TIA), and cerebral infarction without residual deficits: Secondary | ICD-10-CM | POA: Diagnosis not present

## 2014-12-06 DIAGNOSIS — Z85828 Personal history of other malignant neoplasm of skin: Secondary | ICD-10-CM | POA: Diagnosis not present

## 2014-12-06 DIAGNOSIS — Z9861 Coronary angioplasty status: Secondary | ICD-10-CM | POA: Diagnosis not present

## 2014-12-06 LAB — COMPREHENSIVE METABOLIC PANEL
ALBUMIN: 4.2 g/dL (ref 3.5–5.0)
ALT: <5 U/L — ABNORMAL LOW (ref 17–63)
ANION GAP: 6 (ref 5–15)
AST: 26 U/L (ref 15–41)
Alkaline Phosphatase: 53 U/L (ref 38–126)
BUN: 25 mg/dL — ABNORMAL HIGH (ref 6–20)
CHLORIDE: 107 mmol/L (ref 101–111)
CO2: 26 mmol/L (ref 22–32)
Calcium: 9.2 mg/dL (ref 8.9–10.3)
Creatinine, Ser: 0.96 mg/dL (ref 0.61–1.24)
GFR calc Af Amer: >60 mL/min (ref >60)
GLUCOSE: 90 mg/dL (ref 65–99)
POTASSIUM: 4.3 mmol/L (ref 3.5–5.1)
Sodium: 139 mmol/L (ref 135–145)
TOTAL PROTEIN: 7.3 g/dL (ref 6.5–8.1)
Total Bilirubin: 0.6 mg/dL (ref 0.3–1.2)

## 2014-12-06 LAB — CBC
HEMATOCRIT: 33.1 % — AB (ref 39.0–52.0)
HEMOGLOBIN: 10.6 g/dL — AB (ref 13.0–17.0)
MCH: 30.3 pg (ref 26.0–34.0)
MCHC: 32 g/dL (ref 30.0–36.0)
MCV: 94.6 fL (ref 78.0–100.0)
Platelets: 123 10*3/uL — ABNORMAL LOW (ref 150–400)
RBC: 3.5 MIL/uL — ABNORMAL LOW (ref 4.22–5.81)
RDW: 14.9 % (ref 11.5–15.5)
WBC: 8 10*3/uL (ref 4.0–10.5)

## 2014-12-06 LAB — LIPASE, BLOOD: LIPASE: 43 U/L (ref 11–51)

## 2014-12-06 LAB — URINALYSIS, ROUTINE W REFLEX MICROSCOPIC
Bilirubin Urine: NEGATIVE
Glucose, UA: NEGATIVE mg/dL
Hgb urine dipstick: NEGATIVE
Ketones, ur: NEGATIVE mg/dL
Leukocytes, UA: NEGATIVE
Nitrite: NEGATIVE
Protein, ur: NEGATIVE mg/dL
SPECIFIC GRAVITY, URINE: 1.026 (ref 1.005–1.030)
UROBILINOGEN UA: 0.2 mg/dL (ref 0.0–1.0)
pH: 5.5 (ref 5.0–8.0)

## 2014-12-06 NOTE — ED Notes (Signed)
Patient was recently diagnosed with c.diff  In September and was instructed by the physician if diarrhea reoccurs he would need to be tested for c.diff.  [name] reports  2 episodes of diarrhea today. No blood in stool.
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
## 2014-12-06 NOTE — ED Notes (Signed)
Nurse starting IV 

## 2014-12-06 NOTE — Patient Instructions (Signed)

## 2014-12-07 LAB — C DIFFICILE QUICK SCREEN W PCR REFLEX
C DIFFICILE (CDIFF) INTERP: POSITIVE
C Diff antigen: POSITIVE — AB
C Diff toxin: POSITIVE — AB

## 2014-12-07 MED ORDER — VANCOMYCIN HCL 125 MG PO CAPS: 125.0000 mg | ORAL_CAPSULE | Freq: Four times a day (QID) | ORAL | Status: DC

## 2014-12-07 MED ORDER — VANCOMYCIN 50 MG/ML ORAL SOLUTION: 250.0000 mg | Freq: Four times a day (QID) | ORAL | Status: DC

## 2014-12-07 NOTE — Discharge Instructions (Signed)
1. Medications: vancomycin, usual home medications 2. Treatment: rest, drink plenty of fluids 3. Follow Up: please followup with your primary doctor for discussion of your diagnoses and further evaluation after today's visit; please return to the ER for high fever, severe abdominal pain, persistent diarrhea, new or worsening symptoms   Clostridium Difficile Infection Clostridium difficile (C. difficile or C. diff) is a bacterium normally found in the intestinal tract or colon. C. difficile infection causes diarrhea and sometimes a severe disease called pseudomembranous colitis (C. difficile colitis). C. difficile colitis can damage the lining of the colon or cause the colon to become very large (toxic megacolon). Older adults and people with certain medical conditions have a greater risk of getting C. difficile infections. CAUSES The balance of bacteria in your colon can change when you are sick, especially when taking antibiotic medicine. Taking antibiotics may allow the C. difficile to grow, multiply, and make a toxin that causes C. difficile infection.  SYMPTOMS  Diarrhea.  Fever.  Fatigue.  Loss of appetite.  Nausea.  Abdominal swelling, pain, or tenderness.  Dehydration. DIAGNOSIS Your health care provider may suspect C. difficile infection based on your symptoms and if you have taken antibiotics recently. Your health care provider may also order:  A lab test that can detect the toxin in your stool.  A sigmoidoscopy or colonoscopy to look at the appearance of your colon. These procedures involve passing an instrument through your rectum to look at the inside of your colon. Your health care provider will help determine if these tests are necessary. TREATMENT Treatment may include:  Taking antibiotics that keep C. difficile from growing.  Stopping the antibiotics you were on before the C. difficile infection began. Only do this if instructed to do so by your health care  provider.  IV fluids and correction of electrolyte imbalance.  Surgery to remove the infected part of the intestines. This is rare. HOME CARE INSTRUCTIONS  Drink enough fluids to keep your urine clear or pale yellow. Avoid milk, caffeine, and alcohol.  Ask your health care provider for specific rehydration instructions.  Eat small, frequent meals rather than large meals.  Take your antibiotics as directed. Finish them even if you start to feel better.  Do not use medicines to slow diarrhea. This could delay healing or cause problems.  Wash your hands thoroughly after using the bathroom and before preparing food. Make sure people who live with you wash their hands often, too.  Clean all surfaces with a product that contains chlorine bleach. SEEK MEDICAL CARE IF:  Your diarrhea lasts longer than expected or comes back after you finish your antibiotic medicine for the C. difficile infection.  You have trouble staying hydrated.  You have a fever. SEEK IMMEDIATE MEDICAL CARE IF:  You have increasing abdominal pain or tenderness.  You have blood in your stools, or your stools look dark black and tarry.  You cannot eat or drink without vomiting.   This information is not intended to replace advice given to you by your health care provider. Make sure you discuss any questions you have with your health care provider.   Document Released: 11/02/2004 Document Revised: 02/13/2014 Document Reviewed: 07/27/2014 Elsevier Interactive Patient Education [employer].

## 2014-12-11 ENCOUNTER — Telehealth: Payer: Self-pay | Admitting: Gastroenterology

## 2014-12-11 NOTE — Telephone Encounter (Signed)
Reviewed with Dr Silverio Decamp. Patient is scheduled with Cecille Rubin in the first available 12/23/14. 3rd recurrence of the C-diff infection. He is currently on Vancomycin 4 times daily. Notified the patient's household. Left a message for the referral coordinator at [facility].

## 2014-12-14 ENCOUNTER — Telehealth: Payer: Self-pay | Admitting: Urgent Care

## 2014-12-14 NOTE — Telephone Encounter (Signed)
Patient was unable to provide stool culture for [place]. His son took patient to [facility] ED and was tested the same for C. Diff which came back positive. He is currently undergoing antibiotic therapy and is doing well. Follow up as needed.

## 2014-12-18 ENCOUNTER — Telehealth: Payer: Self-pay | Admitting: Neurology

## 2014-12-18 DIAGNOSIS — G20 Parkinson's disease: Secondary | ICD-10-CM

## 2014-12-18 MED ORDER — DONEPEZIL HCL 10 MG PO TABS: 10.0000 mg | ORAL_TABLET | Freq: Every day | ORAL | Status: DC

## 2014-12-18 NOTE — Telephone Encounter (Signed)
Spoke to son, [name]. Father is doing well on the Aricept 5 mg. Will be needing a refill. Would like to go ahead and bump dosage up to the 10 mg that had been discussed at previous OV. Please advise.

## 2014-12-18 NOTE — Telephone Encounter (Signed)
PT's Son [name] called in regards to his medication Aricept/Dawn CB# [phone]

## 2014-12-18 NOTE — Telephone Encounter (Signed)
Okay to refill Aricept to 10mg  daily

## 2014-12-19 DIAGNOSIS — G20 Parkinson's disease: Secondary | ICD-10-CM | POA: Diagnosis not present

## 2014-12-19 DIAGNOSIS — I480 Paroxysmal atrial fibrillation: Secondary | ICD-10-CM | POA: Diagnosis not present

## 2014-12-19 DIAGNOSIS — R1310 Dysphagia, unspecified: Secondary | ICD-10-CM | POA: Diagnosis not present

## 2014-12-19 DIAGNOSIS — K219 Gastro-esophageal reflux disease without esophagitis: Secondary | ICD-10-CM | POA: Diagnosis not present

## 2014-12-19 DIAGNOSIS — I10 Essential (primary) hypertension: Secondary | ICD-10-CM | POA: Diagnosis not present

## 2014-12-19 DIAGNOSIS — B9689 Other specified bacterial agents as the cause of diseases classified elsewhere: Secondary | ICD-10-CM | POA: Diagnosis not present

## 2014-12-19 DIAGNOSIS — Z6826 Body mass index (BMI) 26.0-26.9, adult: Secondary | ICD-10-CM | POA: Diagnosis not present

## 2014-12-22 ENCOUNTER — Ambulatory Visit (INDEPENDENT_AMBULATORY_CARE_PROVIDER_SITE_OTHER): Payer: Medicare Other | Admitting: Physician Assistant

## 2014-12-22 ENCOUNTER — Encounter: Payer: Self-pay | Admitting: Physician Assistant

## 2014-12-22 VITALS — BP 130/60 | HR 64 | Ht 64.5 in | Wt 171.6 lb

## 2014-12-22 DIAGNOSIS — A047 Enterocolitis due to Clostridium difficile: Secondary | ICD-10-CM

## 2014-12-22 DIAGNOSIS — A0472 Enterocolitis due to Clostridium difficile, not specified as recurrent: Secondary | ICD-10-CM

## 2014-12-22 NOTE — Progress Notes (Signed)
Reviewed and agree with management plan.  Asier Desroches T. Johndavid Geralds, MD FACG 

## 2014-12-22 NOTE — Patient Instructions (Signed)
Please continue the Florastor max sachet twice a day for 6 weeks OR you can use Florastor 250mg  capsules. 2 a day twice a day for 6 weeks.  Please call the office if diarrhea returns. [phone]
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
## 2014-12-23 ENCOUNTER — Ambulatory Visit: Payer: Medicare Other | Admitting: Physician Assistant

## 2014-12-25 DIAGNOSIS — N189 Chronic kidney disease, unspecified: Secondary | ICD-10-CM | POA: Diagnosis not present

## 2014-12-25 DIAGNOSIS — I129 Hypertensive chronic kidney disease with stage 1 through stage 4 chronic kidney disease, or unspecified chronic kidney disease: Secondary | ICD-10-CM | POA: Diagnosis not present

## 2014-12-25 DIAGNOSIS — E46 Unspecified protein-calorie malnutrition: Secondary | ICD-10-CM | POA: Diagnosis not present

## 2014-12-25 DIAGNOSIS — R1310 Dysphagia, unspecified: Secondary | ICD-10-CM | POA: Diagnosis not present

## 2014-12-25 DIAGNOSIS — I480 Paroxysmal atrial fibrillation: Secondary | ICD-10-CM | POA: Diagnosis not present

## 2014-12-25 DIAGNOSIS — D696 Thrombocytopenia, unspecified: Secondary | ICD-10-CM | POA: Diagnosis not present

## 2014-12-29 ENCOUNTER — Other Ambulatory Visit: Payer: Self-pay | Admitting: Adult Health

## 2015-01-02 ENCOUNTER — Telehealth: Payer: Self-pay | Admitting: Physician Assistant

## 2015-01-02 NOTE — Telephone Encounter (Signed)
Pt called and sain he started having watery diarrhea this morning. Has had 6 BMs so far. No abd pain, N/V or fever, no BRBPR or melena. Pt has hx recurrent c diff. Vancomycin taper called in to [facility]. Pt advised to go to ER if he  Has fever, pain, weakness etc. Otherwise, he is to take vancomycin as directed along with florastor 250 mg 2 caps bid. Office will call h8im Monday to sched f/u. Have called script for vancomycin 125 mg/28ml-- 125 mg qid x 14 days, Then 125 mg bid x 7 days, Then 125 mg qd x 7 day, then 125 mg qod x 7 days, then 125 mg every 3rd day x 14 days then stop. Pt instructed to continue florastor as well.

## 2015-01-04 ENCOUNTER — Telehealth: Payer: Self-pay | Admitting: Physician Assistant

## 2015-01-04 NOTE — Telephone Encounter (Signed)
Spoke with EC and scheduled patient tomorrow at 8:15 AM with [employer], PA-C.

## 2015-01-05 ENCOUNTER — Encounter: Payer: Self-pay | Admitting: Physician Assistant

## 2015-01-05 ENCOUNTER — Ambulatory Visit (INDEPENDENT_AMBULATORY_CARE_PROVIDER_SITE_OTHER): Payer: Medicare Other | Admitting: Physician Assistant

## 2015-01-05 ENCOUNTER — Ambulatory Visit: Payer: Medicare Other | Admitting: Physician Assistant

## 2015-01-05 VITALS — BP 110/74 | HR 62 | Ht 64.5 in | Wt 168.8 lb

## 2015-01-05 DIAGNOSIS — B9689 Other specified bacterial agents as the cause of diseases classified elsewhere: Secondary | ICD-10-CM

## 2015-01-05 DIAGNOSIS — H401132 Primary open-angle glaucoma, bilateral, moderate stage: Secondary | ICD-10-CM | POA: Diagnosis not present

## 2015-01-05 DIAGNOSIS — A498 Other bacterial infections of unspecified site: Secondary | ICD-10-CM

## 2015-01-05 NOTE — Patient Instructions (Signed)
Finish 6 week taper of Vancomycin. Take Florastor 1 tablet by mouth twice daily for 6 weeks.   You may take over the counter Imodium AD as needed for severe diarrhea.  Call us if diarrhea returns.   Make follow up with Nicoletta Ba PA if needed.

## 2015-01-05 NOTE — Progress Notes (Signed)
Reviewed and agree with management plan.  Elisabetta Mishra T. Amedee Cerrone, MD FACG 

## 2015-01-05 NOTE — Progress Notes (Signed)
Patient ID: ZANI KONOW, male   DOB: [birth year], 81 y.o.   MRN: [id number]   Subjective:    Patient ID: SHAWNTAY WHITTET, male    DOB: 05-27-1924, 81 y.o.   MRN: [id number]  HPI Gurinder is a very nice 81 year old white male recently new to GI and established with Dr. Fuller Plan. He was seen in the office on 12/22/2014 after he was diagnosed with C. difficile colitis on October 30 with an ER visit. He had completed a course of vancomycin 10 days and at that point was feeling much better and not having any diarrhea. He comes back in today after developing recurrence of diarrhea over this past weekend. On says he had a bad day on Saturday and Sunday with multiple episodes of diarrhea leading nocturnal episodes. They had called the office and were restarted on a long tapered course of vancomycin 2 days ago. He says he has not had a bowel movement since last night. He has no complaints of abdominal pain or cramping no nausea or vomiting no fever or chills. He has been eating fairly well though bland since recurrence of the diarrhea. Says he feels a bit weak but not bad overall.   Review of Systems Pertinent positive and negative review of systems were noted in the above HPI section.  All other review of systems was otherwise negative.  Outpatient Encounter Prescriptions as of 01/05/2015  Medication Sig  . acetaminophen (TYLENOL) 325 MG tablet Take 2 tablets (650 mg total) by mouth every 6 (six) hours as needed for fever.  Marland Kitchen amLODipine (NORVASC) 2.5 MG tablet Take 2.5 mg by mouth every morning.   . carbidopa-levodopa (SINEMET IR) 25-100 MG per tablet Take 1.5 tablets by mouth 4 (four) times daily. Take 1.tabs at 18am, 1tab at 12-1pm, 1tab at 5pm and 1tab at 9pm  . donepezil (ARICEPT) 10 MG tablet Take 1 tablet (10 mg total) by mouth at bedtime.  Marland Kitchen ELIQUIS 2.5 MG TABS tablet Take 2.5 mg by mouth 2 (two) times daily.   . hydrocortisone 2.5 % cream Apply 1 application topically daily as needed  (irritation/dryness). To face and nose  . ketoconazole (NIZORAL) 2 % cream Apply 1 application topically daily as needed for irritation. Face and nose  . latanoprost (XALATAN) 0.005 % ophthalmic solution Place 1 drop into both eyes at bedtime.   . Melatonin 3 MG TABS Take 3-6 mg by mouth at bedtime as needed (sleep).   . Multiple Vitamin (MULTIVITAMIN WITH MINERALS) TABS Take 1 tablet by mouth daily.  Marland Kitchen PARoxetine (PAXIL) 10 MG tablet Take 1 tablet (10 mg total) by mouth daily.  . Saccharomyces boulardii (FLORASTOR PO) Take by mouth.  . tamsulosin (FLOMAX) 0.4 MG CAPS capsule Take 0.4 mg by mouth daily.   . traMADol-acetaminophen (ULTRACET) 37.5-325 MG per tablet Take 1 tablet by mouth every 6 (six) hours as needed. (Patient taking differently: Take 1 tablet by mouth every 6 (six) hours as needed for moderate pain. )  . vancomycin (VANCOCIN) 250 MG capsule Take 250 mg by mouth 4 (four) times daily.  . [DISCONTINUED] VANCOMYCIN HCL PO Take by mouth. Four times a day   No facility-administered encounter medications on file as of 01/05/2015.   Allergies  Allergen Reactions  . Ciprocin-Fluocin-Procin [Fluocinolone]     Past reactions to "quinolones"   Patient Active Problem List   Diagnosis Date Noted  . Thrombocytopenia ([place]) 11/02/2014  . C. difficile colitis 10/30/2014  . Parkinson's disease dementia ([place]) 10/05/2014  .  Enteritis due to Clostridium difficile 09/22/2013  . Unstable gait 09/22/2013  . FTT (failure to thrive) in adult 09/22/2013  . Depression 09/22/2013  . Tachy-brady syndrome ([place]) 09/05/2012  . Angina pectoris ([place]) 09/05/2012  . Parkinson's disease ([place]) 08/20/2012  . OA (osteoarthritis) of knee 08/07/2011  . Benign prostate hyperplasia 12/21/2010  . Coronary artery disease   . Hypertension   . Hyperlipidemia   . Paroxysmal atrial fibrillation (HCC)   . Long-term (current) use of anticoagulants   . Vertebral compression fracture [facility])    Social History    Social History  . Marital Status: Married    Spouse Name: N/A  . Number of Children: 2  . Years of Education: N/A   Occupational History  . Retired Nurse, mental health    Social History Main Topics  . Smoking status: Never Smoker   . Smokeless tobacco: Never Used  . Alcohol Use: No     Comment: 09/05/2012 "rarely; I had a glass of wine ~ 1 month ago"  . Drug Use: No  . Sexual Activity: No   Other Topics Concern  . Not on file   Social History Narrative    Mr. Richie family history includes Angina in his father; Heart attack in his father; Heart disease in an other family member; Heart failure in his mother and mother; Hypertension in his sister. There is no history of Colon cancer, Esophageal cancer, Kidney disease, or Liver disease.      Objective:    Filed Vitals:   01/05/15 1404  BP: 110/74  Pulse: 62    Physical Exam  well-developed elderly white male in no acute distress, accompanied by his son blood pressure 110/74 pulse 62 height 5 foot 4 weight 168. ENT; nontraumatic normocephalic EOMI PERRLA sclera anicteric, Cardiovascular; regular rate and rhythm with S1-S2 no murmur rub or gallop, Pulmonary; clear bilaterally, Abdomen ;soft nondistended nontender bowel sounds are active no palpable mass or hepatosplenomegaly, Rectal exam not done, Extremities; no clubbing cyanosis or edema skin warm and dry, Neuropsych; mood and affect appropriate       Assessment & Plan:   #1 81 yo male with relapsed cdiff colitis. Initial treatment  10/30 -12/17/2014, and sxs recurred 2 weeks later.  He is already improving on vancomycin #2 parkinsons/dementia-mild #3CAD #4 afib-on eliquis  Plan; Pt will complete a 6 week tapered course of vancomycin- 250 mg QID x 2 weeks, then 250 TID x 7 days, then BID x 7 days then QD x 7 days, then QOD x 7 days  Florastor BID x 4-6 weeks Pt and son will call for any recurrence of diarrhea - during or after completion of above    Alfredia Ferguson PA-C 01/05/2015   Cc: Reynold Bowen, MD

## 2015-02-09 DIAGNOSIS — M1612 Unilateral primary osteoarthritis, left hip: Secondary | ICD-10-CM | POA: Diagnosis not present

## 2015-02-09 DIAGNOSIS — S8001XD Contusion of right knee, subsequent encounter: Secondary | ICD-10-CM | POA: Diagnosis not present

## 2015-02-17 DIAGNOSIS — M1612 Unilateral primary osteoarthritis, left hip: Secondary | ICD-10-CM | POA: Diagnosis not present

## 2015-02-21 ENCOUNTER — Emergency Department (HOSPITAL_COMMUNITY): Payer: Medicare Other

## 2015-02-21 ENCOUNTER — Inpatient Hospital Stay (HOSPITAL_COMMUNITY)
Admission: EM | Admit: 2015-02-21 | Discharge: 2015-02-28 | DRG: 604 | Disposition: A | Discharge status: Skilled Nursing Facility | Payer: Medicare Other | Attending: Internal Medicine | Admitting: Internal Medicine

## 2015-02-21 ENCOUNTER — Encounter (HOSPITAL_COMMUNITY): Payer: Self-pay

## 2015-02-21 DIAGNOSIS — R52 Pain, unspecified: Secondary | ICD-10-CM | POA: Diagnosis not present

## 2015-02-21 DIAGNOSIS — M7981 Nontraumatic hematoma of soft tissue: Secondary | ICD-10-CM | POA: Diagnosis not present

## 2015-02-21 DIAGNOSIS — S301XXA Contusion of abdominal wall, initial encounter: Principal | ICD-10-CM | POA: Diagnosis present

## 2015-02-21 DIAGNOSIS — G9340 Encephalopathy, unspecified: Secondary | ICD-10-CM | POA: Diagnosis not present

## 2015-02-21 DIAGNOSIS — I480 Paroxysmal atrial fibrillation: Secondary | ICD-10-CM | POA: Diagnosis not present

## 2015-02-21 DIAGNOSIS — Z7901 Long term (current) use of anticoagulants: Secondary | ICD-10-CM

## 2015-02-21 DIAGNOSIS — Z8673 Personal history of transient ischemic attack (TIA), and cerebral infarction without residual deficits: Secondary | ICD-10-CM

## 2015-02-21 DIAGNOSIS — Z955 Presence of coronary angioplasty implant and graft: Secondary | ICD-10-CM

## 2015-02-21 DIAGNOSIS — Z85828 Personal history of other malignant neoplasm of skin: Secondary | ICD-10-CM

## 2015-02-21 DIAGNOSIS — M25551 Pain in right hip: Secondary | ICD-10-CM | POA: Diagnosis not present

## 2015-02-21 DIAGNOSIS — X58XXXA Exposure to other specified factors, initial encounter: Secondary | ICD-10-CM | POA: Diagnosis present

## 2015-02-21 DIAGNOSIS — M4850XA Collapsed vertebra, not elsewhere classified, site unspecified, initial encounter for fracture: Secondary | ICD-10-CM | POA: Diagnosis not present

## 2015-02-21 DIAGNOSIS — M1712 Unilateral primary osteoarthritis, left knee: Secondary | ICD-10-CM | POA: Diagnosis present

## 2015-02-21 DIAGNOSIS — Z72820 Sleep deprivation: Secondary | ICD-10-CM

## 2015-02-21 DIAGNOSIS — N400 Enlarged prostate without lower urinary tract symptoms: Secondary | ICD-10-CM | POA: Diagnosis present

## 2015-02-21 DIAGNOSIS — I2510 Atherosclerotic heart disease of native coronary artery without angina pectoris: Secondary | ICD-10-CM | POA: Diagnosis present

## 2015-02-21 DIAGNOSIS — M25562 Pain in left knee: Secondary | ICD-10-CM | POA: Diagnosis not present

## 2015-02-21 DIAGNOSIS — N179 Acute kidney failure, unspecified: Secondary | ICD-10-CM | POA: Diagnosis not present

## 2015-02-21 DIAGNOSIS — M25552 Pain in left hip: Secondary | ICD-10-CM | POA: Diagnosis present

## 2015-02-21 DIAGNOSIS — I129 Hypertensive chronic kidney disease with stage 1 through stage 4 chronic kidney disease, or unspecified chronic kidney disease: Secondary | ICD-10-CM | POA: Diagnosis present

## 2015-02-21 DIAGNOSIS — G20 Parkinson's disease: Secondary | ICD-10-CM | POA: Diagnosis present

## 2015-02-21 DIAGNOSIS — E785 Hyperlipidemia, unspecified: Secondary | ICD-10-CM | POA: Diagnosis present

## 2015-02-21 DIAGNOSIS — Z96651 Presence of right artificial knee joint: Secondary | ICD-10-CM | POA: Diagnosis present

## 2015-02-21 DIAGNOSIS — Z888 Allergy status to other drugs, medicaments and biological substances status: Secondary | ICD-10-CM

## 2015-02-21 DIAGNOSIS — Z79899 Other long term (current) drug therapy: Secondary | ICD-10-CM

## 2015-02-21 DIAGNOSIS — N189 Chronic kidney disease, unspecified: Secondary | ICD-10-CM | POA: Diagnosis present

## 2015-02-21 DIAGNOSIS — J45909 Unspecified asthma, uncomplicated: Secondary | ICD-10-CM | POA: Diagnosis present

## 2015-02-21 DIAGNOSIS — Z96642 Presence of left artificial hip joint: Secondary | ICD-10-CM | POA: Diagnosis not present

## 2015-02-21 DIAGNOSIS — D62 Acute posthemorrhagic anemia: Secondary | ICD-10-CM | POA: Diagnosis present

## 2015-02-21 DIAGNOSIS — Z8249 Family history of ischemic heart disease and other diseases of the circulatory system: Secondary | ICD-10-CM

## 2015-02-21 DIAGNOSIS — R9401 Abnormal electroencephalogram [EEG]: Secondary | ICD-10-CM | POA: Diagnosis present

## 2015-02-21 LAB — PROTIME-INR
INR: 1.34 (ref 0.00–1.49)
PROTHROMBIN TIME: 16.7 s — AB (ref 11.6–15.2)

## 2015-02-21 LAB — CBC WITH DIFFERENTIAL/PLATELET
BASOS ABS: 0 10*3/uL (ref 0.0–0.1)
Basophils Relative: 0 %
Eosinophils Absolute: 0 10*3/uL (ref 0.0–0.7)
Eosinophils Relative: 0 %
HEMATOCRIT: 35.8 % — AB (ref 39.0–52.0)
Hemoglobin: 11.4 g/dL — ABNORMAL LOW (ref 13.0–17.0)
LYMPHS ABS: 0.6 10*3/uL — AB (ref 0.7–4.0)
LYMPHS PCT: 5 %
MCH: 30 pg (ref 26.0–34.0)
MCHC: 31.8 g/dL (ref 30.0–36.0)
MCV: 94.2 fL (ref 78.0–100.0)
MONO ABS: 0.9 10*3/uL (ref 0.1–1.0)
Monocytes Relative: 8 %
NEUTROS ABS: 9.5 10*3/uL — AB (ref 1.7–7.7)
Neutrophils Relative %: 86 %
PLATELETS: 118 10*3/uL — AB (ref 150–400)
RBC: 3.8 MIL/uL — AB (ref 4.22–5.81)
RDW: 14.1 % (ref 11.5–15.5)
WBC: 11 10*3/uL — AB (ref 4.0–10.5)

## 2015-02-21 LAB — BASIC METABOLIC PANEL
Anion gap: 8 (ref 5–15)
BUN: 22 mg/dL — AB (ref 6–20)
CHLORIDE: 102 mmol/L (ref 101–111)
CO2: 25 mmol/L (ref 22–32)
CREATININE: 0.76 mg/dL (ref 0.61–1.24)
Calcium: 8.8 mg/dL — ABNORMAL LOW (ref 8.9–10.3)
GFR calc Af Amer: >60 mL/min (ref >60)
GLUCOSE: 111 mg/dL — AB (ref 65–99)
POTASSIUM: 4 mmol/L (ref 3.5–5.1)
SODIUM: 135 mmol/L (ref 135–145)

## 2015-02-21 LAB — HEMOGLOBIN AND HEMATOCRIT, BLOOD
HEMATOCRIT: 35.5 % — AB (ref 39.0–52.0)
Hemoglobin: 11.4 g/dL — ABNORMAL LOW (ref 13.0–17.0)

## 2015-02-21 LAB — APTT: aPTT: 32 seconds (ref 24–37)

## 2015-02-21 MED ORDER — TAMSULOSIN HCL 0.4 MG PO CAPS
0.4000 mg | ORAL_CAPSULE | Freq: Every day | ORAL | Status: DC
  Administered 2015-02-21 – 2015-02-28 (×8): 0.4 mg via ORAL
  Filled 2015-02-21 (×8): qty 1

## 2015-02-21 MED ORDER — SODIUM CHLORIDE 0.9 % IJ SOLN
3.0000 mL | Freq: Two times a day (BID) | INTRAMUSCULAR | Status: DC
  Administered 2015-02-21 – 2015-02-27 (×9): 3 mL via INTRAVENOUS

## 2015-02-21 MED ORDER — CARBIDOPA-LEVODOPA 25-100 MG PO TABS
1.5000 | ORAL_TABLET | Freq: Four times a day (QID) | ORAL | Status: DC
  Administered 2015-02-21 – 2015-02-25 (×15): 1.5 via ORAL
  Filled 2015-02-21 (×4): qty 2
  Filled 2015-02-21: qty 1
  Filled 2015-02-21: qty 2
  Filled 2015-02-21 (×4): qty 1
  Filled 2015-02-21 (×3): qty 2
  Filled 2015-02-21 (×3): qty 1

## 2015-02-21 MED ORDER — HYDROCORTISONE 2.5 % EX CREA
1.0000 "application " | TOPICAL_CREAM | Freq: Every day | CUTANEOUS | Status: DC | PRN
  Filled 2015-02-21: qty 30

## 2015-02-21 MED ORDER — MELATONIN 5 MG PO CAPS: 5.0000 mg | ORAL_CAPSULE | Freq: Every day | ORAL | Status: DC

## 2015-02-21 MED ORDER — HYDROCORTISONE 1 % EX CREA
TOPICAL_CREAM | Freq: Every day | CUTANEOUS | Status: DC | PRN
  Filled 2015-02-21: qty 28

## 2015-02-21 MED ORDER — ACETAMINOPHEN 325 MG PO TABS
650.0000 mg | ORAL_TABLET | ORAL | Status: DC | PRN
  Administered 2015-02-24: 650 mg via ORAL
  Filled 2015-02-21 (×2): qty 2

## 2015-02-21 MED ORDER — ALUM & MAG HYDROXIDE-SIMETH 200-200-20 MG/5ML PO SUSP: 30.0000 mL | Freq: Four times a day (QID) | ORAL | Status: DC | PRN

## 2015-02-21 MED ORDER — POLYETHYLENE GLYCOL 3350 17 G PO PACK: 17.0000 g | PACK | Freq: Every day | ORAL | Status: DC | PRN

## 2015-02-21 MED ORDER — FENTANYL CITRATE (PF) 100 MCG/2ML IJ SOLN
12.5000 ug | Freq: Once | INTRAMUSCULAR | Status: AC
  Administered 2015-02-21: 12.5 ug via INTRAVENOUS
  Filled 2015-02-21: qty 2

## 2015-02-21 MED ORDER — KETOCONAZOLE 2 % EX CREA
1.0000 "application " | TOPICAL_CREAM | Freq: Every day | CUTANEOUS | Status: DC | PRN
  Administered 2015-02-21: 1 via TOPICAL
  Filled 2015-02-21: qty 15

## 2015-02-21 MED ORDER — PAROXETINE HCL 20 MG PO TABS
10.0000 mg | ORAL_TABLET | Freq: Every day | ORAL | Status: DC
  Administered 2015-02-21 – 2015-02-23 (×3): 10 mg via ORAL
  Filled 2015-02-21 (×3): qty 1

## 2015-02-21 MED ORDER — LATANOPROST 0.005 % OP SOLN
1.0000 [drp] | Freq: Every day | OPHTHALMIC | Status: DC
  Administered 2015-02-21 – 2015-02-27 (×7): 1 [drp] via OPHTHALMIC
  Filled 2015-02-21: qty 2.5

## 2015-02-21 MED ORDER — OXYCODONE-ACETAMINOPHEN 5-325 MG PO TABS
1.0000 | ORAL_TABLET | Freq: Once | ORAL | Status: AC
  Administered 2015-02-21: 1 via ORAL
  Filled 2015-02-21: qty 1

## 2015-02-21 MED ORDER — AMLODIPINE BESYLATE 5 MG PO TABS
2.5000 mg | ORAL_TABLET | Freq: Every day | ORAL | Status: DC
  Administered 2015-02-22 – 2015-02-25 (×4): 2.5 mg via ORAL
  Filled 2015-02-21 (×4): qty 1

## 2015-02-21 MED ORDER — FENTANYL CITRATE (PF) 100 MCG/2ML IJ SOLN
12.5000 ug | Freq: Once | INTRAMUSCULAR | Status: DC
  Filled 2015-02-21: qty 2

## 2015-02-21 MED ORDER — ONDANSETRON HCL 4 MG PO TABS: 4.0000 mg | ORAL_TABLET | Freq: Four times a day (QID) | ORAL | Status: DC | PRN

## 2015-02-21 MED ORDER — SACCHAROMYCES BOULARDII 250 MG PO CAPS
250.0000 mg | ORAL_CAPSULE | Freq: Every day | ORAL | Status: DC
  Administered 2015-02-21 – 2015-02-27 (×7): 250 mg via ORAL
  Filled 2015-02-21 (×7): qty 1

## 2015-02-21 MED ORDER — ADULT MULTIVITAMIN W/MINERALS CH
1.0000 | ORAL_TABLET | Freq: Every day | ORAL | Status: DC
  Administered 2015-02-21 – 2015-02-27 (×7): 1 via ORAL
  Filled 2015-02-21 (×7): qty 1

## 2015-02-21 MED ORDER — HYDROMORPHONE HCL 1 MG/ML IJ SOLN
0.5000 mg | INTRAMUSCULAR | Status: DC | PRN
  Administered 2015-02-21 – 2015-02-22 (×2): 0.5 mg via INTRAVENOUS
  Filled 2015-02-21 (×2): qty 1

## 2015-02-21 MED ORDER — DOCUSATE SODIUM 100 MG PO CAPS
100.0000 mg | ORAL_CAPSULE | Freq: Two times a day (BID) | ORAL | Status: DC
Start: 2015-02-21 — End: 2015-02-28
  Administered 2015-02-21 – 2015-02-28 (×13): 100 mg via ORAL
  Filled 2015-02-21 (×14): qty 1

## 2015-02-21 MED ORDER — DONEPEZIL HCL 5 MG PO TABS
10.0000 mg | ORAL_TABLET | Freq: Every day | ORAL | Status: DC
  Administered 2015-02-21 – 2015-02-23 (×3): 10 mg via ORAL
  Filled 2015-02-21 (×3): qty 2

## 2015-02-21 MED ORDER — ONDANSETRON HCL 4 MG/2ML IJ SOLN: 4.0000 mg | Freq: Four times a day (QID) | INTRAMUSCULAR | Status: DC | PRN

## 2015-02-21 MED ORDER — OXYCODONE HCL 5 MG/5ML PO SOLN
5.0000 mg | ORAL | Status: DC | PRN
  Administered 2015-02-21 – 2015-02-24 (×5): 5 mg via ORAL
  Filled 2015-02-21 (×5): qty 5

## 2015-02-21 MED ORDER — OXYCODONE-ACETAMINOPHEN 5-325 MG PO TABS
2.0000 | ORAL_TABLET | Freq: Once | ORAL | Status: DC
Start: 2015-02-21 — End: 2015-02-21

## 2015-02-21 NOTE — ED Notes (Signed)
ED PA at bedside

## 2015-02-21 NOTE — ED Notes (Signed)
Patient transported to X-ray 

## 2015-02-21 NOTE — ED Provider Notes (Signed)
Patient signed out to me by Garnette Czech, PA-C at shift change.  82 year old male presents with worsening left hip pain x 3 days. Was told he has arthritis, however no significant arthritis on XR in the ED (imaging negative for fracture, dislocation, arthropathy, focal bone abnormality). Of note, patient was seen by ortho on Tuesday, at which time he had a cortisone injection. Given worsening pain today, CT ordered and pending. Plan for possible admission for pain control.   Called by radiologist, who advised patient has left iliopsoas hematoma.  On reassessment of patient, he reports continued pain. He has decreased range of motion of his left lower extremity due to pain and tenderness to palpation of his left anterior hip. No significant erythema or edema. Hospitalist consulted for admission given persistent pain. Spoke with Merrilyn Puma, who advised triad will admit the patient for further evaluation and management.   BP 153/76 mmHg  Pulse 63  Temp(Src) 97.8 F (36.6 C) (Oral)  Resp 27  SpO2 96%   Marella Chimes, [place] 02/21/15 1924

## 2015-02-21 NOTE — ED Notes (Signed)
Pt states chronic pain to left hip. Rec'd cortisol shot to left hip however gave no relief. Pt has increased pain this AM. Pt denies injury.

## 2015-02-21 NOTE — ED Notes (Signed)
MD at bedside.EDP LIU PRESENT TO SPEAK TO DAUGHTER AND PT

## 2015-02-21 NOTE — Progress Notes (Signed)
PHARMACIST - PHYSICIAN ORDER COMMUNICATION  CONCERNING: P&T Medication Policy on Herbal Medications  DESCRIPTION:  This patient's order for: melatonin  has been noted.  This product(s) is classified as an "herbal" or natural product. Due to a lack of definitive safety studies or FDA approval, nonstandard manufacturing practices, plus the potential risk of unknown drug-drug interactions while on inpatient medications, the Pharmacy and Therapeutics Committee does not permit the use of "herbal" or natural products of this type within [facility].   ACTION TAKEN: The pharmacy department is unable to verify this order at this time. Please reevaluate patient's clinical condition at discharge and address if the herbal or natural product(s) should be resumed at that time.   Royetta Asal, PharmD, BCPS Pager [phone] 02/21/2015 9:08 PM

## 2015-02-21 NOTE — ED Notes (Signed)
Attempted to call report to [place] is in report and is unable to take report.

## 2015-02-21 NOTE — ED Notes (Signed)
Bed: WA08 Expected date:  Expected time:  Means of arrival:  Comments: Ems- hip pain

## 2015-02-21 NOTE — ED Notes (Signed)
Pt resides at home. Pt c/o of left hip pain. Chronic condition. Saw PCP 3 days ago. Rec'd cortisol shot for pain control. Pt went walking last night with minimal difficulty however this AM pt was unable to ambulate. Pt reports he is able to stand and bear weight. Pt denies numbness and tingling. Pulses presents good CMS. No other complaints
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
## 2015-02-22 ENCOUNTER — Encounter (HOSPITAL_COMMUNITY): Payer: Self-pay

## 2015-02-22 DIAGNOSIS — I10 Essential (primary) hypertension: Secondary | ICD-10-CM

## 2015-02-22 DIAGNOSIS — M7981 Nontraumatic hematoma of soft tissue: Secondary | ICD-10-CM | POA: Diagnosis not present

## 2015-02-22 DIAGNOSIS — M25552 Pain in left hip: Secondary | ICD-10-CM

## 2015-02-22 DIAGNOSIS — I480 Paroxysmal atrial fibrillation: Secondary | ICD-10-CM

## 2015-02-22 LAB — CBC
HEMATOCRIT: 33.1 % — AB (ref 39.0–52.0)
HEMOGLOBIN: 10.7 g/dL — AB (ref 13.0–17.0)
MCH: 30.5 pg (ref 26.0–34.0)
MCHC: 32.3 g/dL (ref 30.0–36.0)
MCV: 94.3 fL (ref 78.0–100.0)
Platelets: 115 10*3/uL — ABNORMAL LOW (ref 150–400)
RBC: 3.51 MIL/uL — AB (ref 4.22–5.81)
RDW: 14.2 % (ref 11.5–15.5)
WBC: 9 10*3/uL (ref 4.0–10.5)

## 2015-02-22 LAB — BASIC METABOLIC PANEL
ANION GAP: 9 (ref 5–15)
BUN: 25 mg/dL — ABNORMAL HIGH (ref 6–20)
CALCIUM: 8.7 mg/dL — AB (ref 8.9–10.3)
CHLORIDE: 99 mmol/L — AB (ref 101–111)
CO2: 27 mmol/L (ref 22–32)
CREATININE: 0.99 mg/dL (ref 0.61–1.24)
GFR calc Af Amer: >60 mL/min (ref >60)
GFR calc non Af Amer: >60 mL/min (ref >60)
GLUCOSE: 104 mg/dL — AB (ref 65–99)
Potassium: 3.8 mmol/L (ref 3.5–5.1)
Sodium: 135 mmol/L (ref 135–145)

## 2015-02-22 NOTE — Progress Notes (Signed)
CSW spoke with patient's son, [name] at bedside re: discharge planning. Per PT evaluation - SNF/24 hour care recommended. Awaiting call back from [facility] re: private pay rate (as Medicare would not cover SNF stay due to observation status). RNCM, Cookie spoke with son re: home health & private duty caregiver options. Patient currently has a private duty caregiver that comes out for about 3 hours a day. Son to decide whether to increase caregiver hours or pay privately for SNF.    [name], [facility] Clinical Social Worker cell #: [phone]

## 2015-02-22 NOTE — Progress Notes (Signed)
PT Cancellation Note  Patient Details Name: Henry Alvarez MRN: [id number] DOB: Aug 17, 1924   Cancelled Treatment:    Reason Eval/Treat Not Completed: Attempted PT evaluation-unable to perform evaluation (assess gait and ability to negotiate stairs)-pt refused to wear hospital gowns and pt has no other suitable clothing available to wear (shorts are soiled). Will check back as schedule allows.    Weston Anna, MPT Pager: [phone]
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
## 2015-02-22 NOTE — [place] FL2 (Signed)
[place] MEDICAID FL2 LEVEL OF CARE SCREENING TOOL     IDENTIFICATION  Patient Name: Henry Alvarez Birthdate: 1924-11-08 Sex: male Admission Date (Current Location): 02/21/2015  [facility] and [place] Number:  Herbalist and Address:  [facility],  [place] [address] [place]      Provider Number: [id number]  Attending Physician Name and Address:  Bonnielee Haff, MD  Relative Name and Phone Number:       Current Level of Care: Hospital Recommended Level of Care: [place] Prior Approval Number:    Date Approved/Denied:   PASRR Number: [id number]  Discharge Plan: SNF    Current Diagnoses: Patient Active Problem List   Diagnosis Date Noted  . Left hip pain 02/21/2015  . Intractable pain 02/21/2015  . Nontraumatic psoas hematoma 02/21/2015  . Debility 02/21/2015  . Thrombocytopenia ([place]) 11/02/2014  . C. difficile colitis 10/30/2014  . Parkinson's disease dementia ([place]) 10/05/2014  . Enteritis due to Clostridium difficile 09/22/2013  . Unstable gait 09/22/2013  . FTT (failure to thrive) in adult 09/22/2013  . Depression 09/22/2013  . Tachy-brady syndrome ([place]) 09/05/2012  . Angina pectoris ([place]) 09/05/2012  . Parkinson's disease ([place]) 08/20/2012  . OA (osteoarthritis) of knee 08/07/2011  . Benign prostate hyperplasia 12/21/2010  . Coronary artery disease   . Hypertension   . Hyperlipidemia   . Paroxysmal atrial fibrillation (HCC)   . Long-term (current) use of anticoagulants   . Vertebral compression fracture (HCC)     Orientation RESPIRATION BLADDER Height & Weight    Self, Time, Situation, Place  Normal Continent 5\' 4"  (162.6 cm) 161 lbs.  BEHAVIORAL SYMPTOMS/MOOD NEUROLOGICAL BOWEL NUTRITION STATUS      Continent Diet (heart)  AMBULATORY STATUS COMMUNICATION OF NEEDS Skin   Limited Assist Verbally Normal                       Personal Care Assistance Level of Assistance  Bathing, Feeding,  Dressing Bathing Assistance: Limited assistance Feeding assistance: Limited assistance Dressing Assistance: Limited assistance     Functional Limitations [place]  PT (By licensed PT), OT (By licensed OT)     PT Frequency: 5 OT Frequency: 5            Contractures      Additional Factors Info  Code Status, Allergies Code Status Info: fullcode Allergies Info: Ciprocin-fluocin-procin           Current Medications (02/22/2015):  This is the current hospital active medication list Current Facility-Administered Medications  Medication Dose Route Frequency Provider Last Rate Last Dose  . acetaminophen (TYLENOL) tablet 650 mg  650 mg Oral Q4H PRN [name], MD      . alum & mag hydroxide-simeth (MAALOX/MYLANTA) 200-200-20 MG/5ML suspension 30 mL  30 mL Oral Q6H PRN [name], MD      . amLODipine (NORVASC) tablet 2.5 mg  2.5 mg Oral Daily [name], MD   2.5 mg at 02/22/15 0910  . carbidopa-levodopa (SINEMET IR) 25-100 MG per tablet immediate release 1.5 tablet  1.5 tablet Oral QID [name], MD   1.5 tablet at 02/22/15 1506  . docusate sodium (COLACE) capsule 100 mg  100 mg Oral BID [name], MD   100 mg at 02/21/15 2255  . donepezil (ARICEPT) tablet 10 mg  10 mg Oral QHS [name], MD   10 mg  at 02/21/15 2255  . hydrocortisone cream 1 %   Topical Daily PRN Royetta Asal, RPH      . HYDROmorphone (DILAUDID) injection 0.5 mg  0.5 mg Intravenous Q4H PRN [name], MD   0.5 mg at 02/22/15 0511  . ketoconazole (NIZORAL) 2 % cream 1 application  1 application Topical Daily PRN [name], MD   1 application at [phone] [id number]  . latanoprost (XALATAN) 0.005 % ophthalmic solution 1 drop  1 drop Both Eyes QHS [name], MD   1 drop at 02/21/15 2308  . multivitamin with minerals tablet 1 tablet  1 tablet Oral Daily [name], MD   1 tablet at 02/22/15 0910  . ondansetron (ZOFRAN) tablet 4 mg  4 mg Oral Q6H PRN [name], MD       Or  . ondansetron [facility]) injection 4 mg  4 mg Intravenous Q6H PRN [name], MD      . oxyCODONE (ROXICODONE) 5 MG/5ML solution 5 mg  5 mg Oral Q4H PRN [name], MD   5 mg at 02/22/15 0909  . PARoxetine (PAXIL) tablet 10 mg  10 mg Oral QHS [name], MD   10 mg at 02/21/15 2254  . polyethylene glycol (MIRALAX / GLYCOLAX) packet 17 g  17 g Oral Daily PRN [name], MD      . saccharomyces boulardii (FLORASTOR) capsule 250 mg  250 mg Oral QHS [name], MD   250 mg at 02/21/15 2255  . sodium chloride 0.9 % injection 3 mL  3 mL Intravenous Q12H [name], MD   3 mL at 02/22/15 1000  . tamsulosin (FLOMAX) capsule 0.4 mg  0.4 mg Oral Daily [name], MD   0.4 mg at 02/22/15 [id number]     Discharge Medications: Please see discharge summary for a list of discharge medications.  Relevant Imaging Results:  Relevant Lab Results:   Additional Information SSN: [id number]  [place] Brooking, LCSW

## 2015-02-22 NOTE — Care Management Obs Status (Signed)
[place] NOTIFICATION   Patient Details  Name: Henry Alvarez MRN: [id number] Date of Birth: [birth year]   Medicare Observation Status Notification Given:       Purcell Mouton, RN 02/22/2015, 4:47 PM

## 2015-02-22 NOTE — Evaluation (Signed)
Physical Therapy Evaluation Patient Details Name: Henry Alvarez MRN: [id number] DOB: 1924-11-08 Today's Date: 02/22/2015   History of Present Illness  82 yo male admitted with L hip pain/hematoma. hx of Parkinson's, CAD, PAfib, HTN, TIA  Clinical Impression  On eval, pt required Min-Mod assist for mobility-walked ~135 feet with RW. Pain rated 4/10 with activity. Pt tolerated session fairly well. Noted impaired balance especially while donning clothing. Son present during session-interested in pt going to rehab and requested to speak with SW. Explained to son that 24 hour supervision/assist is recommended at this time for safety. Do not feel pt is safe to be home alone at this time.     Follow Up Recommendations SNF (HHPT and 24 hour supervision/assist if SNF is not an option)    Equipment Recommendations       Recommendations for Other Services       Precautions / Restrictions Precautions Precautions: Fall Restrictions Weight Bearing Restrictions: No      Mobility  Bed Mobility Overal bed mobility: Needs Assistance Bed Mobility: Supine to Sit     Supine to sit: Mod assist     General bed mobility comments: Assist for trunk and to scoot to EOB. Increased time. Utilized bedpad for scooting, positioning.   Transfers Overall transfer level: Needs assistance Equipment used: Rolling walker (2 wheeled) Transfers: Sit to/from Stand Sit to Stand: Min assist;From elevated surface         General transfer comment: Assist to rise, stabilize, control descent. VCs safety, hand placement.   Ambulation/Gait Ambulation/Gait assistance: Min assist Ambulation Distance (Feet): 135 Feet Assistive device: Rolling walker (2 wheeled) Gait Pattern/deviations: Step-through pattern;Decreased stride length;Trunk flexed     General Gait Details: Assist to stabilize pt and maneuver safely with walker.   Stairs            Wheelchair Mobility    Modified Rankin (Stroke Patients  Only)       Balance Overall balance assessment: Needs assistance         Standing balance support: During functional activity;Bilateral upper extremity supported;No upper extremity supported Standing balance-Leahy Scale: Poor Standing balance comment: MOD assist for static standing while donning shorts                             Pertinent Vitals/Pain Pain Assessment: 0-10 Pain Score: 4  Pain Location: L hip/LE Pain Descriptors / Indicators: Sore Pain Intervention(s): Monitored during session;Repositioned    Home Living                        Prior Function                 Hand Dominance        Extremity/Trunk Assessment   Upper Extremity Assessment: Generalized weakness           Lower Extremity Assessment: Generalized weakness      Cervical / Trunk Assessment: Kyphotic  Communication      Cognition Arousal/Alertness: Awake/alert Behavior During Therapy: WFL for tasks assessed/performed Overall Cognitive Status: Within Functional Limits for tasks assessed                      General Comments      Exercises        Assessment/Plan    PT Assessment Patient needs continued PT services  PT Diagnosis Difficulty walking;Generalized weakness;Acute pain   PT Problem List Decreased  strength;Decreased activity tolerance;Decreased balance;Pain;Decreased mobility  PT Treatment Interventions DME instruction;Gait training;Functional mobility training;Therapeutic activities;Patient/family education;Balance training;Therapeutic exercise   PT Goals (Current goals can be found in the Care Plan section) Acute Rehab PT Goals Patient Stated Goal: per son-rehab.  PT Goal Formulation: With patient/family Time For Goal Achievement: 03/08/15 Potential to Achieve Goals: Good    Frequency Min 3X/week   Barriers to discharge        Co-evaluation               End of Session Equipment Utilized During Treatment: Gait  belt Activity Tolerance: Patient tolerated treatment well Patient left: in chair;with call bell/phone within reach;with family/visitor present;with chair alarm set      Functional Assessment Tool Used: clinical judgement Functional Limitation: Mobility: Walking and moving around Mobility: Walking and Moving Around Current Status [phone]): At least 20 percent but less than 40 percent impaired, limited or restricted Mobility: Walking and Moving Around Goal Status [phone]): At least 1 percent but less than 20 percent impaired, limited or restricted    Time: 1420-1440 PT Time Calculation (min) (ACUTE ONLY): 20 min   Charges:   PT Evaluation $PT Eval Moderate Complexity: 1 Procedure     PT G Codes:   PT G-Codes **NOT FOR INPATIENT CLASS** Functional Assessment Tool Used: clinical judgement Functional Limitation: Mobility: Walking and moving around Mobility: Walking and Moving Around Current Status [id number] At least 20 percent but less than 40 percent impaired, limited or restricted Mobility: Walking and Moving Around Goal Status [phone]): At least 1 percent but less than 20 percent impaired, limited or restricted    Weston Anna, MPT Pager: [phone]

## 2015-02-23 DIAGNOSIS — N179 Acute kidney failure, unspecified: Secondary | ICD-10-CM | POA: Diagnosis not present

## 2015-02-23 DIAGNOSIS — D62 Acute posthemorrhagic anemia: Secondary | ICD-10-CM

## 2015-02-23 DIAGNOSIS — M7981 Nontraumatic hematoma of soft tissue: Secondary | ICD-10-CM | POA: Diagnosis not present

## 2015-02-23 DIAGNOSIS — M25552 Pain in left hip: Secondary | ICD-10-CM | POA: Diagnosis not present

## 2015-02-23 LAB — CBC
HCT: 30.5 % — ABNORMAL LOW (ref 39.0–52.0)
HEMATOCRIT: 30.5 % — AB (ref 39.0–52.0)
HEMOGLOBIN: 9.8 g/dL — AB (ref 13.0–17.0)
HEMOGLOBIN: 9.9 g/dL — AB (ref 13.0–17.0)
MCH: 30.6 pg (ref 26.0–34.0)
MCH: 30.8 pg (ref 26.0–34.0)
MCHC: 32.1 g/dL (ref 30.0–36.0)
MCHC: 32.5 g/dL (ref 30.0–36.0)
MCV: 95.3 fL (ref 78.0–100.0)
MCV: 95 fL (ref 78.0–100.0)
PLATELETS: 105 10*3/uL — AB (ref 150–400)
Platelets: 108 10*3/uL — ABNORMAL LOW (ref 150–400)
RBC: 3.2 MIL/uL — ABNORMAL LOW (ref 4.22–5.81)
RBC: 3.21 MIL/uL — AB (ref 4.22–5.81)
RDW: 14.2 % (ref 11.5–15.5)
RDW: 14.4 % (ref 11.5–15.5)
WBC: 7.4 10*3/uL (ref 4.0–10.5)
WBC: 7.2 10*3/uL (ref 4.0–10.5)

## 2015-02-23 LAB — BASIC METABOLIC PANEL
ANION GAP: 8 (ref 5–15)
BUN: 41 mg/dL — ABNORMAL HIGH (ref 6–20)
CALCIUM: 9 mg/dL (ref 8.9–10.3)
CO2: 26 mmol/L (ref 22–32)
CREATININE: 1.39 mg/dL — AB (ref 0.61–1.24)
Chloride: 105 mmol/L (ref 101–111)
GFR calc non Af Amer: 44 mL/min — ABNORMAL LOW (ref >60)
GFR, EST AFRICAN AMERICAN: 51 mL/min — AB (ref >60)
Glucose, Bld: 100 mg/dL — ABNORMAL HIGH (ref 65–99)
Potassium: 4.4 mmol/L (ref 3.5–5.1)
SODIUM: 139 mmol/L (ref 135–145)

## 2015-02-23 MED ORDER — SODIUM CHLORIDE 0.9 % IV SOLN
INTRAVENOUS | Status: DC
  Administered 2015-02-23 (×2): via INTRAVENOUS

## 2015-02-23 NOTE — Progress Notes (Signed)
CSW received call from patient's son, [name] informing CSW that he and his sister have decided to take patient home at discharge rather than SNF. RNCM, Cookie aware & will make arrangements for home care. Patient will still need non-emergency ambulance for transport home when ready.    [name], [facility] Clinical Social Worker cell #: [phone]
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
## 2015-02-23 NOTE — Progress Notes (Signed)
OT Cancellation Note  Patient Details Name: Henry Alvarez MRN: [id number] DOB: May 10, 1924   Cancelled Treatment:    Reason Eval/Treat Not Completed: Other (comment) .  Pt very sleepy. When I introduced myself and asked if I could work with him, he state "not now". Will check later today or tomorrow.  Breeonna Mone 02/23/2015, 11:45 AM  Lesle Chris, OTR/L [phone] 02/23/2015

## 2015-02-24 ENCOUNTER — Observation Stay (HOSPITAL_COMMUNITY): Payer: Medicare Other

## 2015-02-24 DIAGNOSIS — N400 Enlarged prostate without lower urinary tract symptoms: Secondary | ICD-10-CM | POA: Diagnosis present

## 2015-02-24 DIAGNOSIS — M4850XA Collapsed vertebra, not elsewhere classified, site unspecified, initial encounter for fracture: Secondary | ICD-10-CM | POA: Diagnosis present

## 2015-02-24 DIAGNOSIS — I4891 Unspecified atrial fibrillation: Secondary | ICD-10-CM | POA: Diagnosis not present

## 2015-02-24 DIAGNOSIS — I480 Paroxysmal atrial fibrillation: Secondary | ICD-10-CM | POA: Diagnosis present

## 2015-02-24 DIAGNOSIS — N179 Acute kidney failure, unspecified: Secondary | ICD-10-CM | POA: Diagnosis present

## 2015-02-24 DIAGNOSIS — D649 Anemia, unspecified: Secondary | ICD-10-CM | POA: Diagnosis not present

## 2015-02-24 DIAGNOSIS — N189 Chronic kidney disease, unspecified: Secondary | ICD-10-CM | POA: Diagnosis present

## 2015-02-24 DIAGNOSIS — Z96651 Presence of right artificial knee joint: Secondary | ICD-10-CM | POA: Diagnosis present

## 2015-02-24 DIAGNOSIS — R4182 Altered mental status, unspecified: Secondary | ICD-10-CM | POA: Diagnosis not present

## 2015-02-24 DIAGNOSIS — M1712 Unilateral primary osteoarthritis, left knee: Secondary | ICD-10-CM | POA: Diagnosis present

## 2015-02-24 DIAGNOSIS — Z8249 Family history of ischemic heart disease and other diseases of the circulatory system: Secondary | ICD-10-CM | POA: Diagnosis not present

## 2015-02-24 DIAGNOSIS — R5381 Other malaise: Secondary | ICD-10-CM

## 2015-02-24 DIAGNOSIS — Z8673 Personal history of transient ischemic attack (TIA), and cerebral infarction without residual deficits: Secondary | ICD-10-CM | POA: Diagnosis not present

## 2015-02-24 DIAGNOSIS — Z79899 Other long term (current) drug therapy: Secondary | ICD-10-CM | POA: Diagnosis not present

## 2015-02-24 DIAGNOSIS — E785 Hyperlipidemia, unspecified: Secondary | ICD-10-CM | POA: Diagnosis present

## 2015-02-24 DIAGNOSIS — Z888 Allergy status to other drugs, medicaments and biological substances status: Secondary | ICD-10-CM | POA: Diagnosis not present

## 2015-02-24 DIAGNOSIS — R5383 Other fatigue: Secondary | ICD-10-CM | POA: Diagnosis not present

## 2015-02-24 DIAGNOSIS — S301XXA Contusion of abdominal wall, initial encounter: Secondary | ICD-10-CM | POA: Diagnosis present

## 2015-02-24 DIAGNOSIS — G9340 Encephalopathy, unspecified: Secondary | ICD-10-CM | POA: Diagnosis not present

## 2015-02-24 DIAGNOSIS — G3183 Dementia with Lewy bodies: Secondary | ICD-10-CM | POA: Diagnosis not present

## 2015-02-24 DIAGNOSIS — I10 Essential (primary) hypertension: Secondary | ICD-10-CM | POA: Diagnosis not present

## 2015-02-24 DIAGNOSIS — Z955 Presence of coronary angioplasty implant and graft: Secondary | ICD-10-CM | POA: Diagnosis not present

## 2015-02-24 DIAGNOSIS — I2510 Atherosclerotic heart disease of native coronary artery without angina pectoris: Secondary | ICD-10-CM | POA: Diagnosis present

## 2015-02-24 DIAGNOSIS — R262 Difficulty in walking, not elsewhere classified: Secondary | ICD-10-CM | POA: Diagnosis not present

## 2015-02-24 DIAGNOSIS — Z5189 Encounter for other specified aftercare: Secondary | ICD-10-CM | POA: Diagnosis not present

## 2015-02-24 DIAGNOSIS — R9401 Abnormal electroencephalogram [EEG]: Secondary | ICD-10-CM | POA: Diagnosis present

## 2015-02-24 DIAGNOSIS — M7981 Nontraumatic hematoma of soft tissue: Secondary | ICD-10-CM | POA: Diagnosis not present

## 2015-02-24 DIAGNOSIS — F329 Major depressive disorder, single episode, unspecified: Secondary | ICD-10-CM | POA: Diagnosis not present

## 2015-02-24 DIAGNOSIS — D62 Acute posthemorrhagic anemia: Secondary | ICD-10-CM | POA: Diagnosis present

## 2015-02-24 DIAGNOSIS — Z72820 Sleep deprivation: Secondary | ICD-10-CM | POA: Diagnosis not present

## 2015-02-24 DIAGNOSIS — M6281 Muscle weakness (generalized): Secondary | ICD-10-CM | POA: Diagnosis not present

## 2015-02-24 DIAGNOSIS — M25552 Pain in left hip: Secondary | ICD-10-CM | POA: Diagnosis not present

## 2015-02-24 DIAGNOSIS — R52 Pain, unspecified: Secondary | ICD-10-CM | POA: Diagnosis not present

## 2015-02-24 DIAGNOSIS — J45909 Unspecified asthma, uncomplicated: Secondary | ICD-10-CM | POA: Diagnosis present

## 2015-02-24 DIAGNOSIS — Z85828 Personal history of other malignant neoplasm of skin: Secondary | ICD-10-CM | POA: Diagnosis not present

## 2015-02-24 DIAGNOSIS — G20 Parkinson's disease: Secondary | ICD-10-CM | POA: Diagnosis not present

## 2015-02-24 DIAGNOSIS — I129 Hypertensive chronic kidney disease with stage 1 through stage 4 chronic kidney disease, or unspecified chronic kidney disease: Secondary | ICD-10-CM | POA: Diagnosis present

## 2015-02-24 DIAGNOSIS — Z7901 Long term (current) use of anticoagulants: Secondary | ICD-10-CM | POA: Diagnosis not present

## 2015-02-24 DIAGNOSIS — X58XXXA Exposure to other specified factors, initial encounter: Secondary | ICD-10-CM | POA: Diagnosis present

## 2015-02-24 LAB — BASIC METABOLIC PANEL
ANION GAP: 10 (ref 5–15)
BUN: 25 mg/dL — ABNORMAL HIGH (ref 6–20)
CALCIUM: 8.7 mg/dL — AB (ref 8.9–10.3)
CO2: 25 mmol/L (ref 22–32)
CREATININE: 0.86 mg/dL (ref 0.61–1.24)
Chloride: 103 mmol/L (ref 101–111)
Glucose, Bld: 87 mg/dL (ref 65–99)
Potassium: 4.1 mmol/L (ref 3.5–5.1)
SODIUM: 138 mmol/L (ref 135–145)

## 2015-02-24 LAB — CBC
HEMATOCRIT: 33.9 % — AB (ref 39.0–52.0)
HEMOGLOBIN: 10.8 g/dL — AB (ref 13.0–17.0)
MCH: 30.5 pg (ref 26.0–34.0)
MCHC: 31.9 g/dL (ref 30.0–36.0)
MCV: 95.8 fL (ref 78.0–100.0)
Platelets: 105 10*3/uL — ABNORMAL LOW (ref 150–400)
RBC: 3.54 MIL/uL — AB (ref 4.22–5.81)
RDW: 14.2 % (ref 11.5–15.5)
WBC: 6.4 10*3/uL (ref 4.0–10.5)

## 2015-02-24 LAB — GLUCOSE, CAPILLARY: Glucose-Capillary: 129 mg/dL — ABNORMAL HIGH (ref 65–99)

## 2015-02-24 MED ORDER — OXYCODONE HCL 5 MG/5ML PO SOLN: 2.5000 mg | ORAL | Status: DC | PRN

## 2015-02-24 MED ORDER — SODIUM CHLORIDE 0.9 % IV SOLN
INTRAVENOUS | Status: DC
  Administered 2015-02-24 – 2015-02-27 (×4): via INTRAVENOUS

## 2015-02-24 MED ORDER — POLYETHYLENE GLYCOL 3350 17 G PO PACK
17.0000 g | PACK | Freq: Every day | ORAL | Status: DC
Start: 2015-02-24 — End: 2015-02-28
  Administered 2015-02-24 – 2015-02-28 (×5): 17 g via ORAL
  Filled 2015-02-24 (×5): qty 1

## 2015-02-24 MED ORDER — PNEUMOCOCCAL VAC POLYVALENT 25 MCG/0.5ML IJ INJ
0.5000 mL | INJECTION | INTRAMUSCULAR | Status: DC
  Filled 2015-02-24 (×3): qty 0.5
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
## 2015-02-24 NOTE — Progress Notes (Signed)
OT Cancellation Note  Patient Details Name: Henry Alvarez MRN: [id number] DOB: 24-Feb-1924   Cancelled Treatment:    Reason Eval/Treat Not Completed: Other (comment).  Pt very sleepy and reports he hardly slept last night. Did not feel up to moving.  Will reattempt this pm  Lindalee Huizinga 02/24/2015, 11:42 AM  Lesle Chris, OTR/L [phone] 02/24/2015

## 2015-02-25 ENCOUNTER — Inpatient Hospital Stay (HOSPITAL_COMMUNITY): Payer: Medicare Other

## 2015-02-25 DIAGNOSIS — G20 Parkinson's disease: Secondary | ICD-10-CM

## 2015-02-25 DIAGNOSIS — R5383 Other fatigue: Secondary | ICD-10-CM

## 2015-02-25 LAB — BASIC METABOLIC PANEL
ANION GAP: 6 (ref 5–15)
BUN: 19 mg/dL (ref 6–20)
CALCIUM: 8.4 mg/dL — AB (ref 8.9–10.3)
CO2: 27 mmol/L (ref 22–32)
CREATININE: 0.82 mg/dL (ref 0.61–1.24)
Chloride: 103 mmol/L (ref 101–111)
Glucose, Bld: 103 mg/dL — ABNORMAL HIGH (ref 65–99)
Potassium: 4.2 mmol/L (ref 3.5–5.1)
SODIUM: 136 mmol/L (ref 135–145)

## 2015-02-25 LAB — URINALYSIS, ROUTINE W REFLEX MICROSCOPIC
BILIRUBIN URINE: NEGATIVE
Glucose, UA: NEGATIVE mg/dL
HGB URINE DIPSTICK: NEGATIVE
Ketones, ur: NEGATIVE mg/dL
Leukocytes, UA: NEGATIVE
Nitrite: NEGATIVE
PH: 6 (ref 5.0–8.0)
Protein, ur: NEGATIVE mg/dL
SPECIFIC GRAVITY, URINE: 1.02 (ref 1.005–1.030)

## 2015-02-25 LAB — BLOOD GAS, ARTERIAL
Acid-Base Excess: 1 mmol/L (ref 0.0–2.0)
Bicarbonate: 24.8 mEq/L — ABNORMAL HIGH (ref 20.0–24.0)
DRAWN BY: 276051
FIO2: 0.21
O2 SAT: 96.3 %
PATIENT TEMPERATURE: 98.6
TCO2: 22.7 mmol/L (ref 0–100)
pCO2 arterial: 38.5 mmHg (ref 35.0–45.0)
pH, Arterial: 7.424 (ref 7.350–7.450)
pO2, Arterial: 85.5 mmHg (ref 80.0–100.0)

## 2015-02-25 LAB — HEMOGLOBIN AND HEMATOCRIT, BLOOD
HCT: 31.2 % — ABNORMAL LOW (ref 39.0–52.0)
HEMOGLOBIN: 10.1 g/dL — AB (ref 13.0–17.0)

## 2015-02-25 LAB — AMMONIA: Ammonia: <9 umol/L — ABNORMAL LOW (ref 9–35)

## 2015-02-25 MED ORDER — CARBIDOPA-LEVODOPA 25-100 MG PO TABS
1.5000 | ORAL_TABLET | Freq: Four times a day (QID) | ORAL | Status: DC
  Administered 2015-02-25 – 2015-02-28 (×11): 1.5 via ORAL
  Filled 2015-02-25: qty 1
  Filled 2015-02-25 (×4): qty 2
  Filled 2015-02-25: qty 1
  Filled 2015-02-25 (×4): qty 2
  Filled 2015-02-25: qty 1

## 2015-02-25 MED ORDER — QUETIAPINE FUMARATE 25 MG PO TABS: 25.0000 mg | ORAL_TABLET | Freq: Every evening | ORAL | Status: DC | PRN

## 2015-02-25 MED ORDER — DONEPEZIL HCL 5 MG PO TABS
10.0000 mg | ORAL_TABLET | Freq: Every day | ORAL | Status: DC
  Administered 2015-02-25 – 2015-02-27 (×3): 10 mg via ORAL
  Filled 2015-02-25 (×3): qty 2

## 2015-02-25 MED ORDER — PAROXETINE HCL 20 MG PO TABS
10.0000 mg | ORAL_TABLET | Freq: Every day | ORAL | Status: DC
  Administered 2015-02-26 – 2015-02-28 (×3): 10 mg via ORAL
  Filled 2015-02-25 (×3): qty 1

## 2015-02-25 NOTE — Progress Notes (Signed)
OT Cancellation Note  Patient Details Name: Henry Alvarez MRN: [id number] DOB: [birth year]   Cancelled Treatment:    Reason Eval/Treat Not Completed: Medical issues which prohibited therapy   Pt more somnolent; ABG and neurology consults pending.  Will likely check back tomorrow.  Caeleigh Prohaska 02/25/2015, 11:52 AM  Lesle Chris, OTR/L [phone] 02/25/2015

## 2015-02-25 NOTE — Clinical Documentation Improvement (Signed)
Internal Medicine  Can the diagnosis of CKD be further specified?   CKD Stage I - GFR greater than or equal to 90  CKD Stage II - GFR 60-89  CKD Stage III - GFR 30-59  CKD Stage IV - GFR 15-29  CKD Stage V - GFR < 15  ESRD (End Stage Renal Disease)  Other condition  Unable to clinically determine   Supporting Information: : (risk factors, signs and symptoms, diagnostics, treatment) 02/23/15: GFR= 44  Please exercise your independent, professional judgment when responding. A specific answer is not anticipated or expected.   Thank You, [place] [phone]
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
## 2015-02-25 NOTE — Care Management Important Message (Signed)
Important Message  Patient Details  Name: [name] MRN: [id number] Date of Birth: April 10, 1924   Medicare Important Message Given:  Yes    Camillo Flaming 02/25/2015, 11:44 AMImportant Message  Patient Details  Name: [name] MRN: [id number] Date of Birth: 01-24-1925   Medicare Important Message Given:  Yes    Camillo Flaming 02/25/2015, 11:44 AM

## 2015-02-25 NOTE — Clinical Social Work Placement (Signed)
Patient now qualifies to meet "inpatient" status (rather than "observation"). CSW spoke with patient's son, [name] at bedside who is accepting bed at [facility] given that he is able to stay in the hospital & continue to meet criteria until Sunday (for the 3 night stay). Ivin Booty at [facility] aware.     [name], [facility] Clinical Social Worker cell #: [phone]    CLINICAL SOCIAL WORK PLACEMENT  NOTE  Date:  02/25/2015  Patient Details  Name: Henry Alvarez MRN: [id number] Date of Birth: 08/23/1924  Clinical Social Work is seeking post-discharge placement for this patient at the [place] level of care (*CSW will initial, date and re-position this form in  chart as items are completed):  Yes   Patient/family provided with [place] Work Department's list of facilities offering this level of care within the geographic area requested by the patient (or if unable, by the patient's family).  Yes   Patient/family informed of their freedom to choose among providers that offer the needed level of care, that participate in Medicare, Medicaid or managed care program needed by the patient, have an available bed and are willing to accept the patient.  Yes   Patient/family informed of 's ownership interest in [facility] and [facility], as well as of the fact that they are under no obligation to receive care at these facilities.  PASRR submitted to EDS on 02/25/15     PASRR number received on 02/25/15     Existing PASRR number confirmed on       FL2 transmitted to all facilities in geographic area requested by pt/family on 02/25/15     FL2 transmitted to all facilities within larger geographic area on       Patient informed that his/her managed care company has contracts with or will negotiate with certain facilities, including the following:        Yes   Patient/family informed of bed offers  received.  Patient chooses bed at [facility]     Physician recommends and patient chooses bed at      Patient to be transferred to [facility] on  .  Patient to be transferred to facility by       Patient family notified on   of transfer.  Name of family member notified:        PHYSICIAN       Additional Comment:    _______________________________________________ Standley Brooking, LCSW 02/25/2015, 11:59 AM

## 2015-02-25 NOTE — Progress Notes (Signed)
PT Cancellation Note  Patient Details Name: Henry Alvarez MRN: [id number] DOB: [birth year]   Cancelled Treatment:    Reason Eval/Treat Not Completed: Fatigue/lethargy limiting ability to participate (pt has been more somnolent, MD ordering ABG to check for hypercapnia and consulting neurology)   [place] Ram E 02/25/2015, 11:40 AM Carmelia Bake, PT, DPT 02/25/2015 Pager: [phone]

## 2015-02-25 NOTE — Care Management Important Message (Signed)
Important Message  Patient Details  Name: Henry Alvarez MRN: [id number] Date of Birth: [birth year]   Medicare Important Message Given:  Yes    Camillo Flaming 02/25/2015, 11:41 AMImportant Message  Patient Details  Name: Henry Alvarez MRN: [id number] Date of Birth: [birth year]   Medicare Important Message Given:  Yes    Camillo Flaming 02/25/2015, 11:41 AM

## 2015-02-25 NOTE — Progress Notes (Signed)
[facility] PROGRESS NOTE  Henry Alvarez [id number] DOB: 11-08-1924 DOA: 02/21/2015  PCP: Sheela Stack, MD  Brief HPI: 82 year old Caucasian male with a past medical history of Parkinson's disease, coronary artery disease with a history of bare-metal stent placement, paroxysmal atrial fibrillation on anticoagulation, hypertension, presented with intractable left hip pain and inability to walk and climb stairs in his home. He was evaluated by his orthopedic surgeon recently and had an injection into the left hip joint on Tuesday. He has had worsening pain since then. Evaluation in the emergency department was concerning for asymmetrical enlargement of the left iliopsoas muscle, consistent with hematoma. He was brought into the hospital for further management.  Consultants: Neurology  Procedures: None  Antibiotics: None  Subjective: Sleepy, keep eyes close. Oriented to place and situation.  This is not usual per son.  Yesterday afternoon when son came back patient was more alert.   Objective: Vital Signs  Filed Vitals:   02/23/15 2305 02/24/15 0542 02/24/15 2139 02/25/15 0651  BP: 169/73 135/83 176/82 187/85  Pulse: 58 59 58 55  Temp: 98.4 F (36.9 C) 98.6 F (37 C) 97.7 F (36.5 C) 97.6 F (36.4 C)  TempSrc: Oral Oral Oral Oral  Resp: 18 18 18 18   Height:      Weight:      SpO2: 98% 99% 100% 100%   No intake or output data in the 24 hours ending 02/25/15 1123 Filed Weights   02/21/15 2031  Weight: 73.074 kg (161 lb 1.6 oz)    General appearance: Sleepy, open eyes follows some command.  Resp: clear to auscultation bilaterally Cardio: S1, S2, appears to be normal. Regular. No S3, S4. Systolic murmur appreciated over the precordium. No pedal edema. GI: soft, non-tender; bowel sounds normal; no masses,  no organomegaly Extremities: Able to lift his left leg off the bed. No limitation of range of motion in the left hip. Neurologic: Lethargic, open eyes  to voice, follows command. Left LE with limitation to movement due to pain. Right upper extremity with chronic weakness per patient and son.   Lab Results:  Basic Metabolic Panel:  Recent Labs Lab 02/21/15 1410 02/22/15 0446 02/23/15 0500 02/24/15 0635  NA 135 135 139 138  K 4.0 3.8 4.4 4.1  CL 102 99* 105 103  CO2 25 27 26 25   GLUCOSE 111* 104* 100* 87  BUN 22* 25* 41* 25*  CREATININE 0.76 0.99 1.39* 0.86  CALCIUM 8.8* 8.7* 9.0 8.7*   CBC:  Recent Labs Lab 02/21/15 1410  02/22/15 0446 02/23/15 0500 02/23/15 1220 02/24/15 0635 02/25/15 0450  WBC 11.0*  --  9.0 7.4 7.2 6.4  --   NEUTROABS 9.5*  --   --   --   --   --   --   HGB 11.4*  < > 10.7* 9.8* 9.9* 10.8* 10.1*  HCT 35.8*  < > 33.1* 30.5* 30.5* 33.9* 31.2*  MCV 94.2  --  94.3 95.3 95.0 95.8  --   PLT 118*  --  115* 108* 105* 105*  --   < > = values in this interval not displayed.   Studies/Results: Ct Head Wo Contrast  02/24/2015  CLINICAL DATA:  Left hip pain with inability to walk. History of Parkinson's disease, coronary artery disease, atrial fibrillation and encephalopathy. EXAM: CT HEAD WITHOUT CONTRAST TECHNIQUE: Contiguous axial images were obtained from the base of the skull through the vertex without intravenous contrast. COMPARISON:  03/16/2012 CT. FINDINGS: There is no  evidence of acute intracranial hemorrhage, mass lesion, brain edema or extra-axial fluid collection. The ventricles and subarachnoid spaces are diffusely prominent but stable. There is no CT evidence of acute cortical infarction. Chronic small vessel ischemic changes are present within the periventricular and subcortical white matter. There are diffuse intracranial vascular calcifications. Mild right maxillary sinus mucosal thickening is present. The visualized paranasal sinuses, mastoid air cells and middle ears are otherwise clear. The calvarium is intact. IMPRESSION: No acute intracranial findings. Stable atrophy and chronic small vessel  ischemic changes. Electronically Signed   By: Richardean Sale M.D.   On: 02/24/2015 16:27    Medications:  Scheduled: . amLODipine  2.5 mg Oral Daily  . carbidopa-levodopa  1.5 tablet Oral QID  . docusate sodium  100 mg Oral BID  . latanoprost  1 drop Both Eyes QHS  . multivitamin with minerals  1 tablet Oral Daily  . pneumococcal 23 valent vaccine  0.5 mL Intramuscular Tomorrow-1000  . polyethylene glycol  17 g Oral Daily  . saccharomyces boulardii  250 mg Oral QHS  . sodium chloride  3 mL Intravenous Q12H  . tamsulosin  0.4 mg Oral Daily   Continuous: . sodium chloride 75 mL/hr at 02/24/15 2338   [id number], alum & mag hydroxide-simeth, hydrocortisone cream, ketoconazole, ondansetron **OR** ondansetron (ZOFRAN) IV  Assessment/Plan:  Active Problems:   Left hip pain   Intractable pain   Nontraumatic psoas hematoma   Debility    Intractable left hip pain secondary to left iliopsoas hematoma (possibly secondary to oral anticoagulant use) Patient's symptoms appear to be improved. The fact that he was taking anticoagulation, likely is partially responsible for this bleeding episode. Holding his anticoagulation for now. Dr [place] Pink Discussed with Dr. Maureen Ralphs. No indication for surgical intervention. He states that anticoagulation can be resumed a few days after his hemoglobin has been stable. Pain medications as needed. PT and OT evaluation. He might need to go to rehabilitation for short-term as he predominantly lives by himself and is unable to climb stairs.  Acute encephalopathy; This could be related to medications, sedatives like opioids. .  Hold donepezil and paxil.  CT head negative for bleeding.  Patient was more alert yesterday afternoon. opiod were discontinue. He was able to eat breakfast this m per nurse. Patient continue to keep eyes close, follows command. Unclear if he was able to sleep last night.  Will do work up for infection. Will order ABG to check for  hypercapnia, will consult neurology.   Anemia due to acute blood loss He remains hemodynamically stable.  No need for transfusion at this time.  Hb has remain stable , today at 10.   Mild acute renal failure Elevation in BUN and creatinine noted. Continue with IV fluids.   History of PAF Holding Eliquis due to acute bleeding episode. Heart rate is stable.  History of Parkinson's disease Continue home dose of Sinemet  History of essential HTN Continue home dose of amlodipine for now  DVT Prophylaxis: SCDs    Code Status: Full code  Family Communication: Discussed with the patient and his son  Disposition Plan: PT recommends SNF. Keep in patient for Work up of AMS, encephalopathy.     LOS: 4 days   Niel Hummer A  [facility] Pager [phone] 02/25/2015, 11:23 AM  If 7PM-7AM, please contact night-coverage at [email or web address], password [facility]

## 2015-02-26 ENCOUNTER — Inpatient Hospital Stay (HOSPITAL_COMMUNITY)
Admit: 2015-02-26 | Discharge: 2015-02-26 | Disposition: A | Discharge status: Home / Self Care | Payer: Medicare Other | Attending: Internal Medicine | Admitting: Internal Medicine

## 2015-02-26 DIAGNOSIS — R4182 Altered mental status, unspecified: Secondary | ICD-10-CM

## 2015-02-26 LAB — BASIC METABOLIC PANEL
ANION GAP: 7 (ref 5–15)
BUN: 20 mg/dL (ref 6–20)
CALCIUM: 8.8 mg/dL — AB (ref 8.9–10.3)
CO2: 25 mmol/L (ref 22–32)
CREATININE: 0.97 mg/dL (ref 0.61–1.24)
Chloride: 106 mmol/L (ref 101–111)
Glucose, Bld: 94 mg/dL (ref 65–99)
Potassium: 4 mmol/L (ref 3.5–5.1)
SODIUM: 138 mmol/L (ref 135–145)

## 2015-02-26 LAB — CBC
HCT: 31.2 % — ABNORMAL LOW (ref 39.0–52.0)
Hemoglobin: 9.9 g/dL — ABNORMAL LOW (ref 13.0–17.0)
MCH: 30.6 pg (ref 26.0–34.0)
MCHC: 31.7 g/dL (ref 30.0–36.0)
MCV: 96.3 fL (ref 78.0–100.0)
PLATELETS: 106 10*3/uL — AB (ref 150–400)
RBC: 3.24 MIL/uL — AB (ref 4.22–5.81)
RDW: 14.4 % (ref 11.5–15.5)
WBC: 7.3 10*3/uL (ref 4.0–10.5)

## 2015-02-26 MED ORDER — AMLODIPINE BESYLATE 5 MG PO TABS
5.0000 mg | ORAL_TABLET | Freq: Every day | ORAL | Status: DC
  Administered 2015-02-26 – 2015-02-28 (×3): 5 mg via ORAL
  Filled 2015-02-26 (×3): qty 1

## 2015-02-26 MED ORDER — POLYSACCHARIDE IRON COMPLEX 150 MG PO CAPS
150.0000 mg | ORAL_CAPSULE | Freq: Every day | ORAL | Status: DC
  Administered 2015-02-26 – 2015-02-28 (×3): 150 mg via ORAL
  Filled 2015-02-26 (×3): qty 1

## 2015-02-26 NOTE — Procedures (Signed)
ELECTROENCEPHALOGRAM REPORT  Date of Study: 02/26/2015  Patient's Name: Henry Alvarez MRN: [id number] Date of Birth: [birth year]  Referring Provider: Dr. Niel Hummer  Clinical History: This is an 82 year old man with altered mental status.  Medications: amLODipine 5 mg Oral Daily  carbidopa-levodopa 1.5 tablet Oral QID docusate sodium 100 mg Oral BID donepezil 10 mg Oral QHS PARoxetine 10 mg Oral Daily tamsulosin     Technical Summary: A multichannel digital EEG recording measured by the international 10-20 system with electrodes applied with paste and impedances below 5000 ohms performed as portable with EKG monitoring in an awake and asleep patient.  Hyperventilation and photic stimulation were not performed.  The digital EEG was referentially recorded, reformatted, and digitally filtered in a variety of bipolar and referential montages for optimal display.   Description: The patient is awake and asleep during the recording.  During maximal wakefulness, there is a symmetric, medium voltage 8 Hz posterior dominant rhythm that attenuates with eye opening. This is admixed with a small amount of diffuse 5-6 Hz theta slowing of the waking background.  During drowsiness and sleep, there is an increase in theta and delta slowing of the background.  Vertex waves and symmetric sleep spindles were seen.  Hyperventilation and photic stimulation were not performed.  There were no epileptiform discharges or electrographic seizures seen.    EKG lead showed occasional extrasystolic beats.  Impression: This awake and asleep EEG is abnormal due to mild diffuse slowing of the waking background.  Clinical Correlation of the above findings indicates diffuse cerebral dysfunction that is non-specific in etiology and can be seen with hypoxic/ischemic injury, toxic/metabolic encephalopathies, neurodegenerative disorders, or medication effect.  The absence of epileptiform  discharges does not rule out a clinical diagnosis of epilepsy.  Clinical correlation is advised.   Ellouise Newer, M.D.

## 2015-02-26 NOTE — Progress Notes (Signed)
Offsite EEG completed at [facility]. Results pending. 

## 2015-02-26 NOTE — Progress Notes (Signed)
Fourth attempt to see pt has been made today.  Pt did not want to get up stating he was too tired.  Son was present and asked if we could come back at a later time. Pt usually gets up at home around 11.  Attempts have been made at different times of day.  It is 10 am now.  Discussed with son and pt the need to get up and out of bed but pt and son stated "not now."  Will sign off of OT at this time as 4 attempts have been made to complete this evaluation. Jinger Neighbors, [place] [id number]

## 2015-02-26 NOTE — Progress Notes (Signed)
[facility] PROGRESS NOTE  Henry Alvarez [id number] DOB: 13-Dec-1924 DOA: 02/21/2015  PCP: Sheela Stack, MD  Brief HPI: 82 year old Caucasian male with a past medical history of Parkinson's disease, coronary artery disease with a history of bare-metal stent placement, paroxysmal atrial fibrillation on anticoagulation, hypertension, presented with intractable left hip pain and inability to walk and climb stairs in his home. He was evaluated by his orthopedic surgeon recently and had an injection into the left hip joint on Tuesday. He has had worsening pain since then. Evaluation in the emergency department was concerning for asymmetrical enlargement of the left iliopsoas muscle, consistent with hematoma. He was brought into the hospital for further management.  Consultants: Neurology  Procedures: None  Antibiotics: None  Subjective: Sleepy again this AM. Didn't eat breakfast this am. Son very concern with patient change in habit and sleepiness.   Objective: Vital Signs  Filed Vitals:   02/25/15 1100 02/25/15 1415 02/25/15 2100 02/26/15 0626  BP: 168/66 151/75 172/67 176/63  Pulse:  53 53 54  Temp:  97.8 F (36.6 C) 98 F (36.7 C) 97.9 F (36.6 C)  TempSrc:  Oral Oral Oral  Resp:  20 16 16   Height:      Weight:      SpO2:  100% 100% 96%    Intake/Output Summary (Last 24 hours) at 02/26/15 1406 Last data filed at 02/26/15 1043  Gross per 24 hour  Intake    903 ml  Output    425 ml  Net    478 ml   Filed Weights   02/21/15 2031  Weight: 73.074 kg (161 lb 1.6 oz)    General appearance: Sleepy, open eyes follows some command.  Resp: clear to auscultation bilaterally Cardio: S1, S2, appears to be normal. Regular. No S3, S4. Systolic murmur appreciated over the precordium. No pedal edema. GI: soft, non-tender; bowel sounds normal; no masses,  no organomegaly Extremities: Able to lift his left leg off the bed. No limitation of range of motion in the  left hip. Neurologic: Lethargic, open eyes to voice, follows command.   Lab Results:  Basic Metabolic Panel:  Recent Labs Lab 02/22/15 0446 02/23/15 0500 02/24/15 0635 02/25/15 1220 02/26/15 0355  NA 135 139 138 136 138  K 3.8 4.4 4.1 4.2 4.0  CL 99* 105 103 103 106  CO2 27 26 25 27 25   GLUCOSE 104* 100* 87 103* 94  BUN 25* 41* 25* 19 20  CREATININE 0.99 1.39* 0.86 0.82 0.97  CALCIUM 8.7* 9.0 8.7* 8.4* 8.8*   CBC:  Recent Labs Lab 02/21/15 1410  02/22/15 0446 02/23/15 0500 02/23/15 1220 02/24/15 0635 02/25/15 0450 02/26/15 0355  WBC 11.0*  --  9.0 7.4 7.2 6.4  --  7.3  NEUTROABS 9.5*  --   --   --   --   --   --   --   HGB 11.4*  < > 10.7* 9.8* 9.9* 10.8* 10.1* 9.9*  HCT 35.8*  < > 33.1* 30.5* 30.5* 33.9* 31.2* 31.2*  MCV 94.2  --  94.3 95.3 95.0 95.8  --  96.3  PLT 118*  --  115* 108* 105* 105*  --  106*  < > = values in this interval not displayed.   Studies/Results: Dg Chest 2 View  02/25/2015  CLINICAL DATA:  Intractable left hip pain. No reported chest symptoms. EXAM: CHEST  2 VIEW COMPARISON:  09/22/2013. FINDINGS: Stable enlarged cardiac silhouette and tortuous and partially calcified thoracic aorta.  Clear lungs with normal vascularity. Thoracic spine degenerative changes. Lower thoracic and upper lumbar spine compression deformities with kyphoplasty material. IMPRESSION: No acute abnormality. Electronically Signed   By: Claudie Revering M.D.   On: 02/25/2015 13:56   Ct Head Wo Contrast  02/24/2015  CLINICAL DATA:  Left hip pain with inability to walk. History of Parkinson's disease, coronary artery disease, atrial fibrillation and encephalopathy. EXAM: CT HEAD WITHOUT CONTRAST TECHNIQUE: Contiguous axial images were obtained from the base of the skull through the vertex without intravenous contrast. COMPARISON:  03/16/2012 CT. FINDINGS: There is no evidence of acute intracranial hemorrhage, mass lesion, brain edema or extra-axial fluid collection. The ventricles  and subarachnoid spaces are diffusely prominent but stable. There is no CT evidence of acute cortical infarction. Chronic small vessel ischemic changes are present within the periventricular and subcortical white matter. There are diffuse intracranial vascular calcifications. Mild right maxillary sinus mucosal thickening is present. The visualized paranasal sinuses, mastoid air cells and middle ears are otherwise clear. The calvarium is intact. IMPRESSION: No acute intracranial findings. Stable atrophy and chronic small vessel ischemic changes. Electronically Signed   By: Richardean Sale M.D.   On: 02/24/2015 16:27    Medications:  Scheduled: . amLODipine  5 mg Oral Daily  . carbidopa-levodopa  1.5 tablet Oral QID  . docusate sodium  100 mg Oral BID  . donepezil  10 mg Oral QHS  . latanoprost  1 drop Both Eyes QHS  . multivitamin with minerals  1 tablet Oral Daily  . PARoxetine  10 mg Oral Daily  . pneumococcal 23 valent vaccine  0.5 mL Intramuscular Tomorrow-1000  . polyethylene glycol  17 g Oral Daily  . saccharomyces boulardii  250 mg Oral QHS  . sodium chloride  3 mL Intravenous Q12H  . tamsulosin  0.4 mg Oral Daily   Continuous: . sodium chloride 75 mL/hr at 02/24/15 2338   [id number], alum & mag hydroxide-simeth, hydrocortisone cream, ketoconazole, ondansetron **OR** ondansetron (ZOFRAN) IV, QUEtiapine  Assessment/Plan:  Active Problems:   Left hip pain   Intractable pain   Nontraumatic psoas hematoma   Debility    Intractable left hip pain secondary to left iliopsoas hematoma (possibly secondary to oral anticoagulant use) Patient's symptoms appear to be improved. The fact that he was taking anticoagulation, likely is partially responsible for this bleeding episode. Holding his anticoagulation for now. Dr [place] Pink Discussed with Dr. Maureen Ralphs. No indication for surgical intervention. He states that anticoagulation can be resumed a few days after his hemoglobin has been  stable. Pain medications as needed. PT and OT evaluation. He might need to go to rehabilitation for short-term as he predominantly lives by himself and is unable to climb stairs.  Acute encephalopathy;  This could be related to medications, sedatives like opioids. .  Continue with  donepezil and paxil.  CT head negative for bleeding.  Patient was more alert yesterday afternoon. opiod were discontinue. He was able to eat breakfast this m per nurse. Patient continue to keep eyes close, follows command.  Neurology re consulted, son would like to speak with neurology  Will check EEG.   Anemia due to acute blood loss He remains hemodynamically stable.  No need for transfusion at this time.  Hb at 9.9   Mild acute renal failure Elevation in BUN and creatinine noted. Continue with IV fluids.   History of PAF Holding Eliquis due to acute bleeding episode. Heart rate is stable.  History of Parkinson's disease Continue home dose of  Sinemet  History of essential HTN Continue home dose of amlodipine for now  DVT Prophylaxis: SCDs    Code Status: Full code  Family Communication: Discussed with the patient and his son  Disposition Plan: PT recommends SNF. Keep in patient for Work up of AMS, encephalopathy.     LOS: 5 days   Niel Hummer A  [facility] Pager [phone] 02/26/2015, 2:06 PM  If 7PM-7AM, please contact night-coverage at [email or web address], password [facility]

## 2015-02-26 NOTE — Progress Notes (Signed)
PT Cancellation Note  Patient Details Name: Henry Alvarez MRN: [id number] DOB: 1924/09/09   Cancelled Treatment:    Reason Eval/Treat Not Completed: Patient at procedure or test/unavailable (pt eating and then in EEG procedure)   Libbie Bartley,KATHrine E 02/26/2015, 3:14 PM Carmelia Bake, PT, DPT 02/26/2015 Pager: [phone]

## 2015-02-27 MED ORDER — ZOLPIDEM TARTRATE 5 MG PO TABS
2.5000 mg | ORAL_TABLET | Freq: Every evening | ORAL | Status: DC | PRN
  Administered 2015-02-27: 2.5 mg via ORAL
  Filled 2015-02-27: qty 1

## 2015-02-27 MED ORDER — HYDRALAZINE HCL 20 MG/ML IJ SOLN
10.0000 mg | Freq: Four times a day (QID) | INTRAMUSCULAR | Status: DC | PRN
  Administered 2015-02-27: 10 mg via INTRAVENOUS
  Filled 2015-02-27: qty 1

## 2015-02-27 NOTE — Progress Notes (Signed)
[facility] PROGRESS NOTE  Henry Alvarez [id number] DOB: April 23, 1924 DOA: 02/21/2015  PCP: Sheela Stack, MD  Brief HPI: 82 year old Caucasian male with a past medical history of Parkinson's disease, coronary artery disease with a history of bare-metal stent placement, paroxysmal atrial fibrillation on anticoagulation, hypertension, presented with intractable left hip pain and inability to walk and climb stairs in his home. He was evaluated by his orthopedic surgeon recently and had an injection into the left hip joint on Tuesday. He has had worsening pain since then. Evaluation in the emergency department was concerning for asymmetrical enlargement of the left iliopsoas muscle, consistent with hematoma. He was brought into the hospital for further management.  Consultants: Neurology  Procedures: None  Antibiotics: None  Subjective: Sleepy again this AM. Didn't eat breakfast this am. Son very concern with patient change in habit and sleepiness.   Objective: Vital Signs  Filed Vitals:   02/26/15 1541 02/26/15 2306 02/27/15 0702 02/27/15 0824  BP: 161/93 178/71 202/75 135/56  Pulse: 57 63 57 56  Temp: 97.4 F (36.3 C) 98.3 F (36.8 C) 97.8 F (36.6 C)   TempSrc: Oral Oral Oral   Resp: 18 16 16    Height:      Weight:      SpO2: 99% 100% 99%     Intake/Output Summary (Last 24 hours) at 02/27/15 1253 Last data filed at 02/27/15 1205  Gross per 24 hour  Intake    120 ml  Output      0 ml  Net    120 ml   Filed Weights   02/21/15 2031  Weight: 73.074 kg (161 lb 1.6 oz)    General appearance: Sleepy, open eyes follows some command.  Resp: clear to auscultation bilaterally Cardio: S1, S2, appears to be normal. Regular. No S3, S4. Systolic murmur appreciated over the precordium. No pedal edema. GI: soft, non-tender; bowel sounds normal; no masses,  no organomegaly Extremities: Able to lift his left leg off the bed. No limitation of range of motion in the  left hip. Neurologic: Lethargic, open eyes to voice, follows command.   Lab Results:  Basic Metabolic Panel:  Recent Labs Lab 02/22/15 0446 02/23/15 0500 02/24/15 0635 02/25/15 1220 02/26/15 0355  NA 135 139 138 136 138  K 3.8 4.4 4.1 4.2 4.0  CL 99* 105 103 103 106  CO2 27 26 25 27 25   GLUCOSE 104* 100* 87 103* 94  BUN 25* 41* 25* 19 20  CREATININE 0.99 1.39* 0.86 0.82 0.97  CALCIUM 8.7* 9.0 8.7* 8.4* 8.8*   CBC:  Recent Labs Lab 02/21/15 1410  02/22/15 0446 02/23/15 0500 02/23/15 1220 02/24/15 0635 02/25/15 0450 02/26/15 0355  WBC 11.0*  --  9.0 7.4 7.2 6.4  --  7.3  NEUTROABS 9.5*  --   --   --   --   --   --   --   HGB 11.4*  < > 10.7* 9.8* 9.9* 10.8* 10.1* 9.9*  HCT 35.8*  < > 33.1* 30.5* 30.5* 33.9* 31.2* 31.2*  MCV 94.2  --  94.3 95.3 95.0 95.8  --  96.3  PLT 118*  --  115* 108* 105* 105*  --  106*  < > = values in this interval not displayed.   Studies/Results: No results found.  Medications:  Scheduled: . amLODipine  5 mg Oral Daily  . carbidopa-levodopa  1.5 tablet Oral QID  . docusate sodium  100 mg Oral BID  . donepezil  10  mg Oral QHS  . iron polysaccharides  150 mg Oral Daily  . latanoprost  1 drop Both Eyes QHS  . multivitamin with minerals  1 tablet Oral Daily  . PARoxetine  10 mg Oral Daily  . pneumococcal 23 valent vaccine  0.5 mL Intramuscular Tomorrow-1000  . polyethylene glycol  17 g Oral Daily  . saccharomyces boulardii  250 mg Oral QHS  . sodium chloride  3 mL Intravenous Q12H  . tamsulosin  0.4 mg Oral Daily   Continuous:   [id number], alum & mag hydroxide-simeth, hydrALAZINE, hydrocortisone cream, ketoconazole, ondansetron **OR** ondansetron (ZOFRAN) IV, zolpidem  Assessment/Plan:  Active Problems:   Left hip pain   Intractable pain   Nontraumatic psoas hematoma   Debility    Intractable left hip pain secondary to left iliopsoas hematoma (possibly secondary to oral anticoagulant use) Patient's symptoms appear  to be improved. The fact that he was taking anticoagulation, likely is partially responsible for this bleeding episode. Holding his anticoagulation for now. Dr [place] Pink Discussed with Dr. Maureen Ralphs. No indication for surgical intervention. He states that anticoagulation can be resumed a few days after his hemoglobin has been stable. Pain medications as needed. PT and OT evaluation. He might need to go to rehabilitation for short-term as he predominantly lives by himself and is unable to climb stairs.  Acute encephalopathy; might just be sleep deprivation This could be related to medications, sedatives like opioids. .  Continue with  donepezil and paxil.  CT head negative for bleeding.  Neurology re consulted, son would like to speak with neurology  EEG negative for seizure, mild diffuse slowing of the waking background. He was not able to sleep last night. Suspect this is the cause of lethargic during the day, but he initially had some confusion also. He is doing better today. Reassurance provide to son.  Will order Very low dose PRN ambien.   Anemia due to acute blood loss He remains hemodynamically stable.  No need for transfusion at this time.  Hb at 9.9 -- repeat in am.   Mild acute renal failure Elevation in BUN and creatinine noted. Hold  IV fluids.   History of PAF Holding Eliquis due to acute bleeding episode. Heart rate is stable.  History of Parkinson's disease Continue home dose of Sinemet  History of essential HTN Elevated BP today this am. received Hydralazine PRN.  Will continue with Norvasc. Will need to monitor BP   DVT Prophylaxis: SCDs    Code Status: Full code  Family Communication: Discussed with the patient and his son  Disposition Plan: PT recommends SNF. Monitor BP , just received IV hydralazine. BP fluctuates ... Discharge tomorrow if stable.     LOS: 6 days   Elmarie Shiley  [facility] Pager [phone] 02/27/2015, 12:53 PM  If 7PM-7AM, please  contact night-coverage at [email or web address], password [facility]

## 2015-02-28 DIAGNOSIS — F0280 Dementia in other diseases classified elsewhere without behavioral disturbance: Secondary | ICD-10-CM | POA: Diagnosis not present

## 2015-02-28 DIAGNOSIS — M25552 Pain in left hip: Secondary | ICD-10-CM | POA: Diagnosis not present

## 2015-02-28 DIAGNOSIS — N400 Enlarged prostate without lower urinary tract symptoms: Secondary | ICD-10-CM | POA: Diagnosis not present

## 2015-02-28 DIAGNOSIS — R5381 Other malaise: Secondary | ICD-10-CM | POA: Diagnosis not present

## 2015-02-28 DIAGNOSIS — I2510 Atherosclerotic heart disease of native coronary artery without angina pectoris: Secondary | ICD-10-CM | POA: Diagnosis not present

## 2015-02-28 DIAGNOSIS — M1712 Unilateral primary osteoarthritis, left knee: Secondary | ICD-10-CM | POA: Diagnosis not present

## 2015-02-28 DIAGNOSIS — M6281 Muscle weakness (generalized): Secondary | ICD-10-CM | POA: Diagnosis not present

## 2015-02-28 DIAGNOSIS — G9340 Encephalopathy, unspecified: Secondary | ICD-10-CM | POA: Diagnosis not present

## 2015-02-28 DIAGNOSIS — R52 Pain, unspecified: Secondary | ICD-10-CM | POA: Diagnosis not present

## 2015-02-28 DIAGNOSIS — G20 Parkinson's disease: Secondary | ICD-10-CM | POA: Diagnosis not present

## 2015-02-28 DIAGNOSIS — M7981 Nontraumatic hematoma of soft tissue: Secondary | ICD-10-CM | POA: Diagnosis not present

## 2015-02-28 DIAGNOSIS — I10 Essential (primary) hypertension: Secondary | ICD-10-CM | POA: Diagnosis not present

## 2015-02-28 DIAGNOSIS — D649 Anemia, unspecified: Secondary | ICD-10-CM | POA: Diagnosis not present

## 2015-02-28 DIAGNOSIS — D62 Acute posthemorrhagic anemia: Secondary | ICD-10-CM | POA: Diagnosis not present

## 2015-02-28 DIAGNOSIS — F329 Major depressive disorder, single episode, unspecified: Secondary | ICD-10-CM | POA: Diagnosis not present

## 2015-02-28 DIAGNOSIS — Z5189 Encounter for other specified aftercare: Secondary | ICD-10-CM | POA: Diagnosis not present

## 2015-02-28 DIAGNOSIS — R262 Difficulty in walking, not elsewhere classified: Secondary | ICD-10-CM | POA: Diagnosis not present

## 2015-02-28 DIAGNOSIS — I480 Paroxysmal atrial fibrillation: Secondary | ICD-10-CM | POA: Diagnosis not present

## 2015-02-28 DIAGNOSIS — R531 Weakness: Secondary | ICD-10-CM | POA: Diagnosis not present

## 2015-02-28 DIAGNOSIS — K5900 Constipation, unspecified: Secondary | ICD-10-CM | POA: Diagnosis not present

## 2015-02-28 DIAGNOSIS — G3183 Dementia with Lewy bodies: Secondary | ICD-10-CM | POA: Diagnosis not present

## 2015-02-28 DIAGNOSIS — E785 Hyperlipidemia, unspecified: Secondary | ICD-10-CM | POA: Diagnosis not present

## 2015-02-28 DIAGNOSIS — I4891 Unspecified atrial fibrillation: Secondary | ICD-10-CM | POA: Diagnosis not present

## 2015-02-28 LAB — HEMOGLOBIN AND HEMATOCRIT, BLOOD
HEMATOCRIT: 33.4 % — AB (ref 39.0–52.0)
HEMOGLOBIN: 10.8 g/dL — AB (ref 13.0–17.0)

## 2015-02-28 MED ORDER — AMLODIPINE BESYLATE 5 MG PO TABS: 5.0000 mg | ORAL_TABLET | Freq: Every day | ORAL | Status: DC

## 2015-02-28 MED ORDER — POLYETHYLENE GLYCOL 3350 17 G PO PACK: 17.0000 g | PACK | Freq: Every day | ORAL | Status: DC

## 2015-02-28 MED ORDER — POLYSACCHARIDE IRON COMPLEX 150 MG PO CAPS: 150.0000 mg | ORAL_CAPSULE | Freq: Every day | ORAL | Status: DC

## 2015-02-28 MED ORDER — ACETAMINOPHEN 325 MG PO TABS: 650.0000 mg | ORAL_TABLET | ORAL | Status: DC | PRN

## 2015-02-28 NOTE — Discharge Summary (Signed)
Physician Discharge Summary  DEMARRI WEIDEMAN [id number] DOB: 11/26/1924 DOA: 02/21/2015  PCP: Sheela Stack, MD  Admit date: 02/21/2015 Discharge date: 02/28/2015  Time spent: 35 minutes  Recommendations for Outpatient Follow-up:  Please repeat Hb 1-23, if stable consider resuming eliquis.  Needs to follow up with primary cardiologist   Discharge Diagnoses:    Left hip pain   Acute blood loss anemia secondary to psoas hematoma   Intractable pain   Nontraumatic psoas hematoma   Debility   Encephalopathy vs sleep deprivation   Discharge Condition: stable  Diet recommendation: heart healthy  Filed Weights   02/21/15 2031  Weight: 73.074 kg (161 lb 1.6 oz)    History of present illness:  HPI: Henry Alvarez is a 82 y.o. gentleman with a history of Parkinson's disease, CAD with a history of bare metal stent placement, paroxysmal atrial fibrillation (anticoagulated with Eliquis), HTN, HLD, and TIA who is accompanied by his daughter. The patient recently recovered from C diff colitis (treated with vancomycin in late 2016) and feels that he was in his baseline until approximately two weeks ago. He has had progressive pain in the left hip and knee. He denies any history of trauma. He was evaluated by his orthopedic surgeon (his daughter reports that xrays were done) and referred for a cortisol injection, because his pain was attributed to arthritis at that time. His injection was on Tuesday, and the patient has actually had worsening pain since then, particularly in the past 48 hours. He is practically unable to walk at this point due to pain. Imaging in the emergency room tonight concerning for an asymmetrical enlargement of the left iliopsoas muscle, most consistent with hematoma. Hospitalist asked to admit for pain control and further management.  The patient denies fever. He has had upper respiratory symptoms including cough productive of clear sputum. No hemoptysis.  He takes a flu shot every year but he cannot tell me if he up-to-date on his pneumonia vaccine. No headache, chest pain, shortness of breath, palpitations ("I haven't had problems with my a fib in a long time."), nausea, vomiting, or evidence of blood loss in his urine or stool. His diarrhea has resolved. No light-headedness or LOC.  Hospital Course:  Intractable left hip pain secondary to left iliopsoas hematoma (possibly secondary to oral anticoagulant use) Patient's symptoms appear to be improved. The fact that he was taking anticoagulation, likely is partially responsible for this bleeding episode. Holding his anticoagulation for now. Dr [place] Pink Discussed with Dr. Maureen Ralphs. No indication for surgical intervention. He states that anticoagulation can be resumed a few days after his hemoglobin has been stable. Pain medications as needed. PT and OT evaluation. He might need to go to rehabilitation for short-term as he predominantly lives by himself and is unable to climb stairs.  Hb at 10 today. Need to repeat Hb tomorrow, if stable consider resuming eliquis. He will need very close follow up of hb level.   Acute encephalopathy; Might just be sleep deprivation This could be related to medications, sedatives like opioids. . vs sleep deprivation Continue with donepezil and paxil.  CT head negative for bleeding.  Neurology re consulted EEG negative for seizure, mild diffuse slowing of the waking background. He was not able to sleep overnight night. Suspect this is the cause of lethargic during the day, but he initially had some confusion also. He is doing better today. Reassurance provide to son.  Will order Very low dose PRN Ambien, patient did ok with 2.5  mg. Would resume his melatonin at the facility .   Anemia due to acute blood loss He remains hemodynamically stable. No need for transfusion at this time.  Hb at 9.9 -- repeat in am today at 10.    Mild acute renal failure Elevation in  BUN and creatinine noted. Hold IV fluids.   History of PAF Holding Eliquis due to acute bleeding episode. Heart rate is stable. Consider resuming eliquis in 24 hours if hb remain stable.   History of Parkinson's disease Continue home dose of Sinemet  History of essential HTN Elevated BP today this am. received Hydralazine PRN.  Norvasc increase to 5 mg during this admission/   Procedures:  EEG; This awake and asleep EEG is abnormal due to mild diffuse slowing of the waking background.  Consultations:  Neurology  Discharge Exam: Filed Vitals:   02/27/15 2122 02/28/15 0518  BP: 151/98 166/66  Pulse: 56 55  Temp: 98 F (36.7 C) 97.9 F (36.6 C)  Resp: 16 16    General: NAD Cardiovascular: S 1, S 2 RRR Respiratory: CTA  Discharge Instructions   Discharge Instructions    Diet - low sodium heart healthy    Complete by:  As directed      Increase activity slowly    Complete by:  As directed           Current Discharge Medication List    START taking these medications   Details  acetaminophen (TYLENOL) 325 MG tablet Take 2 tablets (650 mg total) by mouth every 4 (four) hours as needed for mild pain, fever or headache. Qty: 30 tablet, Refills: 0    iron polysaccharides (NIFEREX) 150 MG capsule Take 1 capsule (150 mg total) by mouth daily. Qty: 30 capsule, Refills: 0    polyethylene glycol (MIRALAX / GLYCOLAX) packet Take 17 g by mouth daily. Qty: 14 each, Refills: 0      CONTINUE these medications which have CHANGED   Details  amLODipine (NORVASC) 5 MG tablet Take 1 tablet (5 mg total) by mouth daily. Qty: 30 tablet, Refills: 0      CONTINUE these medications which have NOT CHANGED   Details  carbidopa-levodopa (SINEMET IR) 25-100 MG per tablet Take 1.5 tablets by mouth 4 (four) times daily. Take 1.tabs at 18am, 1tab at 12-1pm, 1tab at 5pm and 1tab at 9pm Qty: 280 tablet, Refills: 6    donepezil (ARICEPT) 10 MG tablet Take 1 tablet (10 mg total) by  mouth at bedtime. Qty: 30 tablet, Refills: 3   Associated Diagnoses: Parkinson's disease ([place])    hydrocortisone 2.5 % cream Apply 1 application topically daily as needed (irritation/dryness). To face and nose    ketoconazole (NIZORAL) 2 % cream Apply 1 application topically daily as needed for irritation. Face and nose    latanoprost (XALATAN) 0.005 % ophthalmic solution Place 1 drop into both eyes at bedtime.     Melatonin 5 MG CAPS Take 5 mg by mouth at bedtime.    Multiple Vitamin (MULTIVITAMIN WITH MINERALS) TABS Take 1 tablet by mouth daily.    PARoxetine (PAXIL) 10 MG tablet Take 1 tablet (10 mg total) by mouth daily. Qty: 30 tablet, Refills: 3    Saccharomyces boulardii (FLORASTOR PO) Take 1 capsule by mouth daily.     tamsulosin (FLOMAX) 0.4 MG CAPS capsule Take 0.4 mg by mouth daily.       STOP taking these medications     ELIQUIS 2.5 MG TABS tablet  ibuprofen (ADVIL,MOTRIN) 200 MG tablet      traMADol-acetaminophen (ULTRACET) 37.5-325 MG per tablet        Allergies  Allergen Reactions  . Ciprocin-Fluocin-Procin [Fluocinolone]     Past reactions to "quinolones"-just not to have it per Family member.    Follow-up Information    Follow up with Sheela Stack, MD In 1 week.   Specialty:  Endocrinology   Contact information:   [place] [address] [phone]        The results of significant diagnostics from this hospitalization (including imaging, microbiology, ancillary and laboratory) are listed below for reference.    Significant Diagnostic Studies: Dg Chest 2 View  02/25/2015  CLINICAL DATA:  Intractable left hip pain. No reported chest symptoms. EXAM: CHEST  2 VIEW COMPARISON:  09/22/2013. FINDINGS: Stable enlarged cardiac silhouette and tortuous and partially calcified thoracic aorta. Clear lungs with normal vascularity. Thoracic spine degenerative changes. Lower thoracic and upper lumbar spine compression deformities with  kyphoplasty material. IMPRESSION: No acute abnormality. Electronically Signed   By: Claudie Revering M.D.   On: 02/25/2015 13:56   Ct Head Wo Contrast  02/24/2015  CLINICAL DATA:  Left hip pain with inability to walk. History of Parkinson's disease, coronary artery disease, atrial fibrillation and encephalopathy. EXAM: CT HEAD WITHOUT CONTRAST TECHNIQUE: Contiguous axial images were obtained from the base of the skull through the vertex without intravenous contrast. COMPARISON:  03/16/2012 CT. FINDINGS: There is no evidence of acute intracranial hemorrhage, mass lesion, brain edema or extra-axial fluid collection. The ventricles and subarachnoid spaces are diffusely prominent but stable. There is no CT evidence of acute cortical infarction. Chronic small vessel ischemic changes are present within the periventricular and subcortical white matter. There are diffuse intracranial vascular calcifications. Mild right maxillary sinus mucosal thickening is present. The visualized paranasal sinuses, mastoid air cells and middle ears are otherwise clear. The calvarium is intact. IMPRESSION: No acute intracranial findings. Stable atrophy and chronic small vessel ischemic changes. Electronically Signed   By: Richardean Sale M.D.   On: 02/24/2015 16:27   Ct Pelvis Wo Contrast  02/21/2015  CLINICAL DATA:  Chronic left hip pain worsening after walking last night. No reported acute injury. Unable to bear weight. EXAM: CT PELVIS WITHOUT CONTRAST TECHNIQUE: Multidetector CT imaging of the pelvis was performed following the standard protocol without intravenous contrast. COMPARISON:  Radiographs today and 06/27/2012. FINDINGS: No evidence of acute fracture, dislocation or femoral head avascular necrosis. There are mild symmetric degenerative changes of both hips. There are also mild degenerative changes of the sacroiliac joints. Disc degeneration, vacuum phenomenon and facet disease are present at the L4-5 and L5-S1 disc space  levels. No significant hip joint effusion. There is marked asymmetric enlargement of the left psoas and iliacus muscles within the pelvis, extending into the anterior aspect of the left proximal thigh. There are no focal hyperdense components to confirm acute hematoma. No focal low-density components are seen within the pelvis. There is minimal low-density in the proximal left thigh on image 69, likely fluid in the iliopsoas bursa. There is no rectus sheath enlargement. Underlying aortoiliac atherosclerosis noted. The prostate gland appears unremarkable. IMPRESSION: 1. Marked asymmetric enlargement of the left iliopsoas musculature, most likely secondary to a subacute hematoma. Correlate clinically. Infection should be excluded clinically, although is not favored by the CT appearance. 2. No evidence of acute fracture or dislocation. 3. Mild degenerative changes within the hips, sacroiliac joints and lumbar spine. No evidence of sacroiliitis. Electronically Signed  By: Richardean Sale M.D.   On: 02/21/2015 17:55   Dg Knee Complete 4 Views Left  02/21/2015  CLINICAL DATA:  Pain EXAM: LEFT KNEE - COMPLETE 4+ VIEW COMPARISON:  None. FINDINGS: Frontal, lateral, and bilateral oblique views were obtained. The patient is status post total knee replacement with prosthetic components appearing well seated. No acute fracture or dislocation is evident. There is a small joint effusion. No erosive change. IMPRESSION: Status post total knee replacement with prosthetic components well-seated. Small joint effusion. No fracture or dislocation. No erosive change. Electronically Signed   By: Lowella Grip III M.D.   On: 02/21/2015 14:51   Dg Hips Bilat With Pelvis Min 5 Views  02/21/2015  CLINICAL DATA:  Left greater than right hip pain.  No known injury. EXAM: DG HIP (WITH OR WITHOUT PELVIS) 5+V BILAT COMPARISON:  None. FINDINGS: There is no evidence of hip fracture or dislocation. There is no evidence of arthropathy or  other focal bone abnormality. No pelvic fracture or significant bone abnormality identified. IMPRESSION: Negative. Electronically Signed   By: Earle Gell M.D.   On: 02/21/2015 14:51    Microbiology: No results found for this or any previous visit (from the past 240 hour(s)).   Labs: Basic Metabolic Panel:  Recent Labs Lab 02/22/15 0446 02/23/15 0500 02/24/15 0635 02/25/15 1220 02/26/15 0355  NA 135 139 138 136 138  K 3.8 4.4 4.1 4.2 4.0  CL 99* 105 103 103 106  CO2 27 26 25 27 25   GLUCOSE 104* 100* 87 103* 94  BUN 25* 41* 25* 19 20  CREATININE 0.99 1.39* 0.86 0.82 0.97  CALCIUM 8.7* 9.0 8.7* 8.4* 8.8*   Liver Function Tests: No results for input(s): AST, ALT, ALKPHOS, BILITOT, PROT, ALBUMIN in the last 168 hours. No results for input(s): LIPASE, AMYLASE in the last 168 hours.  Recent Labs Lab 02/25/15 1442  AMMONIA <9*   CBC:  Recent Labs Lab 02/21/15 1410  02/22/15 0446 02/23/15 0500 02/23/15 1220 02/24/15 0635 02/25/15 0450 02/26/15 0355 02/28/15 0511  WBC 11.0*  --  9.0 7.4 7.2 6.4  --  7.3  --   NEUTROABS 9.5*  --   --   --   --   --   --   --   --   HGB 11.4*  < > 10.7* 9.8* 9.9* 10.8* 10.1* 9.9* 10.8*  HCT 35.8*  < > 33.1* 30.5* 30.5* 33.9* 31.2* 31.2* 33.4*  MCV 94.2  --  94.3 95.3 95.0 95.8  --  96.3  --   PLT 118*  --  115* 108* 105* 105*  --  106*  --   < > = values in this interval not displayed. Cardiac Enzymes: No results for input(s): CKTOTAL, CKMB, CKMBINDEX, TROPONINI in the last 168 hours. BNP: BNP (last 3 results) No results for input(s): BNP in the last 8760 hours.  ProBNP (last 3 results) No results for input(s): PROBNP in the last 8760 hours.  CBG:  Recent Labs Lab 02/24/15 1209  GLUCAP 129*       Signed:  Niel Hummer A MD.  [facility] 02/28/2015, 10:49 AM

## 2015-02-28 NOTE — Clinical Social Work Placement (Signed)
   CLINICAL SOCIAL WORK PLACEMENT  NOTE  Date:  02/28/2015  Patient Details  Name: Henry Alvarez MRN: [id number] Date of Birth: 23-Feb-1924  Clinical Social Work is seeking post-discharge placement for this patient at the [place] level of care (*CSW will initial, date and re-position this form in  chart as items are completed):  Yes   Patient/family provided with [facility] Work Department's list of facilities offering this level of care within the geographic area requested by the patient (or if unable, by the patient's family).  Yes   Patient/family informed of their freedom to choose among providers that offer the needed level of care, that participate in Medicare, Medicaid or managed care program needed by the patient, have an available bed and are willing to accept the patient.  Yes   Patient/family informed of [facility]'s ownership interest in [facility] and [facility], as well as of the fact that they are under no obligation to receive care at these facilities.  PASRR submitted to EDS on 02/25/15     PASRR number received on 02/25/15     Existing PASRR number confirmed on       FL2 transmitted to all facilities in geographic area requested by pt/family on 02/25/15     FL2 transmitted to all facilities within larger geographic area on       Patient informed that his/her managed care company has contracts with or will negotiate with certain facilities, including the following:        Yes   Patient/family informed of bed offers received.  Patient chooses bed at [facility]     Physician recommends and patient chooses bed at      Patient to be transferred to [facility] on 02/28/15.  Patient to be transferred to facility by Ambulance [facility])     Patient family notified on 02/28/15 of transfer.  Name of family member notified:  Son- [name] Solan     PHYSICIAN Please prepare priority discharge summary, including  medications, Please prepare prescriptions, Please sign FL2     Additional Comment:    _______________________________________________ Williemae Area, LCSW 02/28/2015, 12:23 PM

## 2015-02-28 NOTE — Progress Notes (Addendum)
Pt transferred to: [facility] Anticipated date of transfer: 02/28/15 Transported by: Ambulance ([facility]) Time Tentatively Scheduled for: 1:30 PM Family notified: Fabiola Mckethan Report #: [phone]   RN Supervisor Nursing notified to call report; DC summary sent to facility for review. Son is very pleased with d/c plan; patient is alert but confused.  No further CSW needs identified.   CSW signing off.  Kendell Bane, LCSW [phone] (weekend coverage)

## 2015-03-01 ENCOUNTER — Non-Acute Institutional Stay (SKILLED_NURSING_FACILITY): Payer: Medicare Other | Admitting: Internal Medicine

## 2015-03-01 DIAGNOSIS — R531 Weakness: Secondary | ICD-10-CM

## 2015-03-01 DIAGNOSIS — I10 Essential (primary) hypertension: Secondary | ICD-10-CM | POA: Diagnosis not present

## 2015-03-01 DIAGNOSIS — F0280 Dementia in other diseases classified elsewhere without behavioral disturbance: Secondary | ICD-10-CM

## 2015-03-01 DIAGNOSIS — N400 Enlarged prostate without lower urinary tract symptoms: Secondary | ICD-10-CM

## 2015-03-01 DIAGNOSIS — M7981 Nontraumatic hematoma of soft tissue: Secondary | ICD-10-CM

## 2015-03-01 DIAGNOSIS — K5900 Constipation, unspecified: Secondary | ICD-10-CM | POA: Diagnosis not present

## 2015-03-01 DIAGNOSIS — D62 Acute posthemorrhagic anemia: Secondary | ICD-10-CM | POA: Diagnosis not present

## 2015-03-01 DIAGNOSIS — I480 Paroxysmal atrial fibrillation: Secondary | ICD-10-CM | POA: Diagnosis not present

## 2015-03-01 DIAGNOSIS — G20 Parkinson's disease: Secondary | ICD-10-CM | POA: Diagnosis not present

## 2015-03-01 DIAGNOSIS — F329 Major depressive disorder, single episode, unspecified: Secondary | ICD-10-CM | POA: Diagnosis not present

## 2015-03-01 DIAGNOSIS — F32A Depression, unspecified: Secondary | ICD-10-CM

## 2015-03-01 DIAGNOSIS — K5909 Other constipation: Secondary | ICD-10-CM
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
## 2015-03-02 LAB — CBC AND DIFFERENTIAL
HEMATOCRIT: 32 % — AB (ref 41–53)
HEMOGLOBIN: 10.2 g/dL — AB (ref 13.5–17.5)
Neutrophils Absolute: 7 /uL
PLATELETS: 120 10*3/uL — AB (ref 150–399)
WBC: 8.9 10*3/mL

## 2015-03-02 LAB — HEPATIC FUNCTION PANEL
ALT: 4 U/L — AB (ref 10–40)
AST: 17 U/L (ref 14–40)
Alkaline Phosphatase: 56 U/L (ref 25–125)
BILIRUBIN, TOTAL: 0.7 mg/dL

## 2015-03-02 LAB — BASIC METABOLIC PANEL
BUN: 26 mg/dL — AB (ref 4–21)
Creatinine: 1 mg/dL (ref 0.6–1.3)
Glucose: 89 mg/dL
Potassium: 4.1 mmol/L (ref 3.4–5.3)
Sodium: 137 mmol/L (ref 137–147)

## 2015-03-10 DIAGNOSIS — I480 Paroxysmal atrial fibrillation: Secondary | ICD-10-CM | POA: Diagnosis not present

## 2015-03-10 DIAGNOSIS — Z5189 Encounter for other specified aftercare: Secondary | ICD-10-CM | POA: Diagnosis not present

## 2015-03-10 DIAGNOSIS — G3183 Dementia with Lewy bodies: Secondary | ICD-10-CM | POA: Diagnosis not present

## 2015-03-10 DIAGNOSIS — K5900 Constipation, unspecified: Secondary | ICD-10-CM | POA: Diagnosis not present

## 2015-03-10 DIAGNOSIS — G9340 Encephalopathy, unspecified: Secondary | ICD-10-CM | POA: Diagnosis not present

## 2015-03-10 DIAGNOSIS — N400 Enlarged prostate without lower urinary tract symptoms: Secondary | ICD-10-CM | POA: Diagnosis not present

## 2015-03-10 DIAGNOSIS — R531 Weakness: Secondary | ICD-10-CM | POA: Diagnosis not present

## 2015-03-10 DIAGNOSIS — M7981 Nontraumatic hematoma of soft tissue: Secondary | ICD-10-CM | POA: Diagnosis not present

## 2015-03-10 DIAGNOSIS — M1712 Unilateral primary osteoarthritis, left knee: Secondary | ICD-10-CM | POA: Diagnosis not present

## 2015-03-10 DIAGNOSIS — D649 Anemia, unspecified: Secondary | ICD-10-CM | POA: Diagnosis not present

## 2015-03-10 DIAGNOSIS — E785 Hyperlipidemia, unspecified: Secondary | ICD-10-CM | POA: Diagnosis not present

## 2015-03-10 DIAGNOSIS — I2510 Atherosclerotic heart disease of native coronary artery without angina pectoris: Secondary | ICD-10-CM | POA: Diagnosis not present

## 2015-03-10 DIAGNOSIS — M6281 Muscle weakness (generalized): Secondary | ICD-10-CM | POA: Diagnosis not present

## 2015-03-10 DIAGNOSIS — F0280 Dementia in other diseases classified elsewhere without behavioral disturbance: Secondary | ICD-10-CM | POA: Diagnosis not present

## 2015-03-10 DIAGNOSIS — I10 Essential (primary) hypertension: Secondary | ICD-10-CM | POA: Diagnosis not present

## 2015-03-10 DIAGNOSIS — G20 Parkinson's disease: Secondary | ICD-10-CM | POA: Diagnosis not present

## 2015-03-10 DIAGNOSIS — M25552 Pain in left hip: Secondary | ICD-10-CM | POA: Diagnosis not present

## 2015-03-10 DIAGNOSIS — I4891 Unspecified atrial fibrillation: Secondary | ICD-10-CM | POA: Diagnosis not present

## 2015-03-10 DIAGNOSIS — R262 Difficulty in walking, not elsewhere classified: Secondary | ICD-10-CM | POA: Diagnosis not present

## 2015-03-10 DIAGNOSIS — F329 Major depressive disorder, single episode, unspecified: Secondary | ICD-10-CM | POA: Diagnosis not present

## 2015-03-10 DIAGNOSIS — D62 Acute posthemorrhagic anemia: Secondary | ICD-10-CM | POA: Diagnosis not present

## 2015-03-12 ENCOUNTER — Non-Acute Institutional Stay (SKILLED_NURSING_FACILITY): Payer: Medicare Other | Admitting: Adult Health

## 2015-03-12 ENCOUNTER — Encounter: Payer: Self-pay | Admitting: Adult Health

## 2015-03-12 DIAGNOSIS — K5900 Constipation, unspecified: Secondary | ICD-10-CM

## 2015-03-12 DIAGNOSIS — D62 Acute posthemorrhagic anemia: Secondary | ICD-10-CM | POA: Diagnosis not present

## 2015-03-12 DIAGNOSIS — I10 Essential (primary) hypertension: Secondary | ICD-10-CM | POA: Diagnosis not present

## 2015-03-12 DIAGNOSIS — F329 Major depressive disorder, single episode, unspecified: Secondary | ICD-10-CM

## 2015-03-12 DIAGNOSIS — N400 Enlarged prostate without lower urinary tract symptoms: Secondary | ICD-10-CM

## 2015-03-12 DIAGNOSIS — F32A Depression, unspecified: Secondary | ICD-10-CM

## 2015-03-12 DIAGNOSIS — M7981 Nontraumatic hematoma of soft tissue: Secondary | ICD-10-CM

## 2015-03-12 DIAGNOSIS — K5909 Other constipation: Secondary | ICD-10-CM

## 2015-03-12 DIAGNOSIS — I480 Paroxysmal atrial fibrillation: Secondary | ICD-10-CM | POA: Diagnosis not present

## 2015-03-12 DIAGNOSIS — G20 Parkinson's disease: Secondary | ICD-10-CM

## 2015-03-12 DIAGNOSIS — F0280 Dementia in other diseases classified elsewhere without behavioral disturbance: Secondary | ICD-10-CM

## 2015-03-12 DIAGNOSIS — R531 Weakness: Secondary | ICD-10-CM | POA: Diagnosis not present

## 2015-03-12 NOTE — Progress Notes (Signed)
Patient ID: Henry Alvarez, male   DOB: 1924-06-07, 82 y.o.   MRN: [id number]    DATE:  03/12/15  MRN:  [id number]  BIRTHDAY: 09-Dec-1924  Facility:  [facility] Location:  [place] [facility] [place] Room Number: [id number]  LEVEL OF CARE:  SNF (31)  Contact Information    Name Relation Home Work Henry Alvarez Son   [phone]   Tlusty,Henry Alvarez Daughter [phone]         Code Status History    Date Active Date Inactive Code Status Order ID Comments User Context   03/26/2015  4:41 PM 03/29/2015  7:09 PM Full Code [id number]  [name], MD Inpatient   02/21/2015  8:53 PM 02/28/2015  5:21 PM Full Code [id number]  [name], MD Inpatient   10/30/2014  5:28 AM 11/03/2014  5:22 PM Full Code [id number]  [name], DO Inpatient   09/22/2013 12:37 AM 09/25/2013  5:32 PM Full Code [id number]  [name], MD Inpatient   08/07/2011 11:18 AM 08/10/2011  2:27 PM Full Code [id number]  Denton Meek, RN Inpatient       Chief Complaint  Patient presents with  . Discharge Note    HISTORY OF PRESENT ILLNESS:  This is an 82 year old male who is for discharge home  with home health PT for endurance. He has been admitted to [facility] on 02/28/15 from [facility] with intractable left hip pain secondary to left iliopsoas hematoma thought to be from oral anticoagulation. His anticoagulation was held and conservative management with pain medication was recommended. He was noted to have acute encephalopathy. Ct head was negative for acute findings and EEG was negative for seizure activities. It was thought to be secondary to sleep deprivation and side effect of sedative medication. He had blood loss anemia but did not require blood transfusion.  He has PMH of parkinson's disease, CAD with bare metal stent, afib, HTN, HLD   Patient was admitted to this facility for short-term rehabilitation after the patient's recent hospitalization.  Patient has completed SNF  rehabilitation and therapy has cleared the patient for discharge.   PAST MEDICAL HISTORY:  Past Medical History  Diagnosis Date  . Arthritis   . Atrial fibrillation ([place])   . Parkinson disease ([place])   . Depression   . Anemia   . Iron deficiency anemia   . BPH (benign prostatic hyperplasia)   . Dementia   . Hypertension   . Thrombocytopenia ([place])   . C. difficile colitis     hx of cdiff last fall 2016      CURRENT MEDICATIONS: Reviewed  Patient's Medications                Previous Medications   *ACETAMINOPHEN (TYLENOL) 325 MG TABLET    Take 2 tablets (650 mg total) by mouth every 4 (four) hours as needed for mild pain, fever or headache.           *DONEPEZIL (ARICEPT) 10 MG TABLET    Take 1 tablet (10 mg total) by mouth at bedtime.       HYDROCORTISONE 2.5 % CREAM    Apply 1 application topically daily as needed (irritation/dryness). To face and nose       KETOCONAZOLE (NIZORAL) 2 % CREAM    Apply 1 application topically daily as needed for irritation. Face and nose   *LATANOPROST (XALATAN) 0.005 % OPHTHALMIC SOLUTION    Place 1 drop into both eyes  at bedtime.    *MELATONIN 5 MG CAPS    Take 5 mg by mouth at bedtime.       PAROXETINE (PAXIL) 10 MG TABLET    Take 10 mg by mouth daily.       *SACCHAROMYCES BOULARDII (FLORASTOR PO)    Take 1 capsule by mouth daily.    *TAMSULOSIN (FLOMAX) 0.4 MG CAPS CAPSULE    Take 0.4 mg by mouth daily.    UNABLE TO FIND    Take 120 mLs by mouth 2 (two) times daily. Med Name: [employer]  Modified Medications        *AMLODIPINE (NORVASC) 5 MG TABLET    Take 1 tablet (5 mg total) by mouth daily.   *CARBIDOPA-LEVODOPA (SINEMET IR) 25-100 MG PER TABLET    Take 1.5 tablets by mouth 4 (four) times daily. Take 1.tabs at 18am, 1tab at 12-1pm, 1tab at 5pm and 1tab at 9pm   *IRON POLYSACCHARIDES (NIFEREX) 150 MG CAPSULE    Take 1 capsule (150 mg total) by mouth daily.   *MULTIPLE VITAMIN (MULTIVITAMIN WITH MINERALS) TABS    Take 1 tablet by mouth  daily.       *POLYETHYLENE GLYCOL (MIRALAX / GLYCOLAX) PACKET    Take 17 g by mouth daily.    Allergies  Allergen Reactions  . Ciprocin-Fluocin-Procin [Fluocinolone]     Past reactions to "quinolones"-just not to have it per Family member.      REVIEW OF SYSTEMS:  GENERAL: no change in appetite, no fatigue, no weight changes, no fever, chills or weakness EYES: Denies change in vision, dry eyes, eye pain, itching or discharge EARS: Denies change in hearing, ringing in ears, or earache NOSE: Denies nasal congestion or epistaxis MOUTH and THROAT: Denies oral discomfort, gingival pain or bleeding, pain from teeth or hoarseness   RESPIRATORY: no cough, SOB, DOE, wheezing, hemoptysis CARDIAC: no chest pain, edema or palpitations GI: no abdominal pain, diarrhea, constipation, heart burn, nausea or vomiting GU: Denies dysuria, frequency, hematuria, incontinence, or discharge PSYCHIATRIC: Denies feeling of depression or anxiety. No report of hallucinations, insomnia, paranoia, or agitation   PHYSICAL EXAMINATION  GENERAL APPEARANCE: Well nourished. In no acute distress. Normal body habitus SKIN:  Left side of abdomen with slight bruising HEAD: Normal in size and contour. No evidence of trauma EYES: Lids open and close normally. No blepharitis, entropion or ectropion. PERRL. Conjunctivae are clear and sclerae are white. Lenses are without opacity EARS: Pinnae are normal. Patient hears normal voice tunes of the examiner MOUTH and THROAT: Lips are without lesions. Oral mucosa is moist and without lesions. Tongue is normal in shape, size, and color and without lesions NECK: supple, trachea midline, no neck masses, no thyroid tenderness, no thyromegaly LYMPHATICS: no LAN in the neck, no supraclavicular LAN RESPIRATORY: breathing is even & unlabored, BS CTAB CARDIAC: Irregular heart rate, no murmur,no extra heart sounds, no edema GI: abdomen soft, normal BS, no masses, no tenderness, no  hepatomegaly, no splenomegaly EXTREMITIES:  Able to move 4 extremities PSYCHIATRIC: Alert and oriented X 3. Affect and behavior are appropriate    LABS/RADIOLOGY: Labs reviewed:  Basic Metabolic Panel: [id number]  sodium 137 potassium 4.1 glucose 89 BUN 26 creatinine 0.98 calcium 8.3 total protein 5.6 albumin 3.30 globulin 2.3 total bilirubin 0.72 alkaline phosphatase 56 SGOT 17 SGPT 4 WBC 8.9 hemoglobin 10.2 hematocrit 31.8 MCV 95.2 platelet 120 Recent Labs  10/30/14 0111      NA 136  < >     K 3.8  < >  CL 104  < >     CO2 23  < >     GLUCOSE 103*  < >     BUN 21*  < >     CREATININE 1.04  < >     CALCIUM 9.0  < >     MG 1.8  --       Liver Function Tests:  Recent Labs  10/31/14 0500 11/02/14 0450 11/16/14 12/06/14 1940 03/02/15  AST 26 25 25 26 17   ALT <5* <5* 12 <5* 4*  ALKPHOS 52 43 50 53 56  BILITOT 0.9 0.6  --  0.6  --   PROT 5.9* 5.5*  --  7.3  --   ALBUMIN 3.0* 2.8*  --  4.2  --     Recent Labs  10/30/14 0111 12/06/14 1940  LIPASE 22 43    Recent Labs  02/25/15 1442  AMMONIA <9*   CBC:  Recent Labs  02/21/15 1410  03/02/15     WBC 11.0*  < > 8.9     NEUTROABS 9.5*  --  7     HGB 11.4*  < > 10.2*     HCT 35.8*  < > 32*     MCV 94.2  < >  --      PLT 118*  < > 120*     < > = values in this interval not displayed.    Dg Chest 2 View  03/26/2015  CLINICAL DATA:  Fever. EXAM: CHEST  2 VIEW COMPARISON:  February 25, 2015. FINDINGS: Stable cardiomediastinal silhouette. No pneumothorax or pleural effusion is noted. No acute pulmonary disease is noted. Status post kyphoplasty of lower thoracic vertebral body. IMPRESSION: No active cardiopulmonary disease. Electronically Signed   By: Marijo Conception, M.D.   On: 03/26/2015 08:50   Ct Head Wo Contrast  03/26/2015  CLINICAL DATA:  Altered loss of consciousness.  Eliquis use EXAM: CT HEAD WITHOUT CONTRAST TECHNIQUE: Contiguous axial images were obtained from the base of the skull through the vertex  without intravenous contrast. COMPARISON:  02/24/2015 FINDINGS: Skull and Sinuses:Negative for fracture or destructive process. The visualized mastoids, middle ears, and imaged paranasal sinuses are clear. Visualized orbits: Negative. Brain: No evidence of acute infarction, hemorrhage, hydrocephalus, or mass lesion/mass effect. Generalized cerebral volume loss, mild for age. Chronic small-vessel disease with confluent ischemic gliosis in the deep cerebral white matter. Calcified intracranial atherosclerosis, extensive in the vertebral arteries. IMPRESSION: 1. No acute finding. 2. Moderate chronic small vessel disease. Electronically Signed   By: Monte Fantasia M.D.   On: 03/26/2015 10:32    ASSESSMENT/PLAN:  Generalized weakness - will have home health PT; fall precautions  Left hip iliopsoas hematoma - weakness on hold; pulse progression  Blood loss anemia - hemoglobin 10.2; continue Niferex 150 mg daily  Hypertension - continue Norvasc 5 mg 1 tab by mouth daily  Atrial fibrillation - rate controlled; Eliquis on hold due to drop in Hgb and hematoma to left hip.  Parkinson's disease -  Stable; continue Sinemet 25-100 mg 1 1/2 tab PO QID  Dementia - continue Aricept 10 mg 1 tab PO Q HS  Chronic depression - mood this is stable; continue Paxil 10 mg 1 tab by mouth daily  BPH - continue Flomax 0.4 mg 1 capsule daily  Chronic constipation - continue Miralax 17 gm PO daily      I have filled out patient's discharge paperwork and written prescriptions.  Patient will receive home  health PT.  Total discharge time: Less than 30 minutes  Discharge time involved coordination of the discharge process with Education officer, museum, nursing staff and therapy department. Medical justification for home health services verified.     [facility], NP [employer] [phone]

## 2015-03-13 DIAGNOSIS — F0280 Dementia in other diseases classified elsewhere without behavioral disturbance: Secondary | ICD-10-CM | POA: Diagnosis not present

## 2015-03-13 DIAGNOSIS — I10 Essential (primary) hypertension: Secondary | ICD-10-CM | POA: Diagnosis not present

## 2015-03-13 DIAGNOSIS — M7981 Nontraumatic hematoma of soft tissue: Secondary | ICD-10-CM | POA: Diagnosis not present

## 2015-03-13 DIAGNOSIS — G20 Parkinson's disease: Secondary | ICD-10-CM | POA: Diagnosis not present

## 2015-03-15 ENCOUNTER — Encounter: Payer: Self-pay | Admitting: Neurology

## 2015-03-15 ENCOUNTER — Ambulatory Visit (INDEPENDENT_AMBULATORY_CARE_PROVIDER_SITE_OTHER): Payer: Medicare Other | Admitting: Neurology

## 2015-03-15 VITALS — BP 104/54 | HR 61 | Wt 163.0 lb

## 2015-03-15 DIAGNOSIS — F329 Major depressive disorder, single episode, unspecified: Secondary | ICD-10-CM | POA: Diagnosis not present

## 2015-03-15 DIAGNOSIS — G20 Parkinson's disease: Secondary | ICD-10-CM

## 2015-03-15 DIAGNOSIS — G319 Degenerative disease of nervous system, unspecified: Secondary | ICD-10-CM | POA: Diagnosis not present

## 2015-03-15 DIAGNOSIS — G20A1 Parkinson's disease without dyskinesia, without mention of fluctuations: Secondary | ICD-10-CM

## 2015-03-15 DIAGNOSIS — F32A Depression, unspecified: Secondary | ICD-10-CM

## 2015-03-15 NOTE — Patient Instructions (Signed)
Continue carbidopa-levodopa 1 1/2 tablets 4 times daily Continue Aricept 10mg  daily Continue Paxil daily Follow up in 6 months.

## 2015-03-15 NOTE — Progress Notes (Signed)
Chart forwarded.  
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
## 2015-03-16 DIAGNOSIS — G20 Parkinson's disease: Secondary | ICD-10-CM | POA: Diagnosis not present

## 2015-03-16 DIAGNOSIS — M7981 Nontraumatic hematoma of soft tissue: Secondary | ICD-10-CM | POA: Diagnosis not present

## 2015-03-18 DIAGNOSIS — G20 Parkinson's disease: Secondary | ICD-10-CM | POA: Diagnosis not present

## 2015-03-18 DIAGNOSIS — M7981 Nontraumatic hematoma of soft tissue: Secondary | ICD-10-CM | POA: Diagnosis not present

## 2015-03-22 DIAGNOSIS — G20 Parkinson's disease: Secondary | ICD-10-CM | POA: Diagnosis not present

## 2015-03-22 DIAGNOSIS — E784 Other hyperlipidemia: Secondary | ICD-10-CM | POA: Diagnosis not present

## 2015-03-22 DIAGNOSIS — M810 Age-related osteoporosis without current pathological fracture: Secondary | ICD-10-CM | POA: Diagnosis not present

## 2015-03-22 DIAGNOSIS — I10 Essential (primary) hypertension: Secondary | ICD-10-CM | POA: Diagnosis not present

## 2015-03-22 DIAGNOSIS — M7981 Nontraumatic hematoma of soft tissue: Secondary | ICD-10-CM | POA: Diagnosis not present

## 2015-03-22 DIAGNOSIS — D6489 Other specified anemias: Secondary | ICD-10-CM | POA: Diagnosis not present

## 2015-03-26 ENCOUNTER — Encounter (HOSPITAL_COMMUNITY): Payer: Self-pay | Admitting: *Deleted

## 2015-03-26 ENCOUNTER — Emergency Department (HOSPITAL_COMMUNITY): Payer: Medicare Other

## 2015-03-26 ENCOUNTER — Inpatient Hospital Stay (HOSPITAL_COMMUNITY)
Admission: EM | Admit: 2015-03-26 | Discharge: 2015-03-29 | DRG: 193 | Disposition: A | Discharge status: Skilled Nursing Facility | Payer: Medicare Other | Attending: Internal Medicine | Admitting: Internal Medicine

## 2015-03-26 DIAGNOSIS — J9601 Acute respiratory failure with hypoxia: Secondary | ICD-10-CM | POA: Diagnosis present

## 2015-03-26 DIAGNOSIS — J181 Lobar pneumonia, unspecified organism: Secondary | ICD-10-CM | POA: Diagnosis present

## 2015-03-26 DIAGNOSIS — I2510 Atherosclerotic heart disease of native coronary artery without angina pectoris: Secondary | ICD-10-CM | POA: Diagnosis present

## 2015-03-26 DIAGNOSIS — Z79899 Other long term (current) drug therapy: Secondary | ICD-10-CM

## 2015-03-26 DIAGNOSIS — Z8673 Personal history of transient ischemic attack (TIA), and cerebral infarction without residual deficits: Secondary | ICD-10-CM | POA: Diagnosis not present

## 2015-03-26 DIAGNOSIS — Z8249 Family history of ischemic heart disease and other diseases of the circulatory system: Secondary | ICD-10-CM | POA: Diagnosis not present

## 2015-03-26 DIAGNOSIS — J111 Influenza due to unidentified influenza virus with other respiratory manifestations: Secondary | ICD-10-CM | POA: Diagnosis not present

## 2015-03-26 DIAGNOSIS — F0280 Dementia in other diseases classified elsewhere without behavioral disturbance: Secondary | ICD-10-CM | POA: Diagnosis present

## 2015-03-26 DIAGNOSIS — F32A Depression, unspecified: Secondary | ICD-10-CM | POA: Diagnosis present

## 2015-03-26 DIAGNOSIS — D696 Thrombocytopenia, unspecified: Secondary | ICD-10-CM | POA: Diagnosis present

## 2015-03-26 DIAGNOSIS — R509 Fever, unspecified: Secondary | ICD-10-CM

## 2015-03-26 DIAGNOSIS — N189 Chronic kidney disease, unspecified: Secondary | ICD-10-CM | POA: Diagnosis present

## 2015-03-26 DIAGNOSIS — J1108 Influenza due to unidentified influenza virus with specified pneumonia: Secondary | ICD-10-CM | POA: Diagnosis not present

## 2015-03-26 DIAGNOSIS — A047 Enterocolitis due to Clostridium difficile: Secondary | ICD-10-CM | POA: Diagnosis present

## 2015-03-26 DIAGNOSIS — A0472 Enterocolitis due to Clostridium difficile, not specified as recurrent: Secondary | ICD-10-CM | POA: Diagnosis present

## 2015-03-26 DIAGNOSIS — M1712 Unilateral primary osteoarthritis, left knee: Secondary | ICD-10-CM | POA: Diagnosis present

## 2015-03-26 DIAGNOSIS — R001 Bradycardia, unspecified: Secondary | ICD-10-CM | POA: Diagnosis present

## 2015-03-26 DIAGNOSIS — Z5189 Encounter for other specified aftercare: Secondary | ICD-10-CM | POA: Diagnosis not present

## 2015-03-26 DIAGNOSIS — R2681 Unsteadiness on feet: Secondary | ICD-10-CM | POA: Diagnosis not present

## 2015-03-26 DIAGNOSIS — I10 Essential (primary) hypertension: Secondary | ICD-10-CM | POA: Diagnosis present

## 2015-03-26 DIAGNOSIS — I129 Hypertensive chronic kidney disease with stage 1 through stage 4 chronic kidney disease, or unspecified chronic kidney disease: Secondary | ICD-10-CM | POA: Diagnosis present

## 2015-03-26 DIAGNOSIS — G20 Parkinson's disease: Secondary | ICD-10-CM | POA: Diagnosis present

## 2015-03-26 DIAGNOSIS — Z881 Allergy status to other antibiotic agents status: Secondary | ICD-10-CM | POA: Diagnosis not present

## 2015-03-26 DIAGNOSIS — E785 Hyperlipidemia, unspecified: Secondary | ICD-10-CM | POA: Diagnosis present

## 2015-03-26 DIAGNOSIS — Z85828 Personal history of other malignant neoplasm of skin: Secondary | ICD-10-CM

## 2015-03-26 DIAGNOSIS — Z7901 Long term (current) use of anticoagulants: Secondary | ICD-10-CM

## 2015-03-26 DIAGNOSIS — J209 Acute bronchitis, unspecified: Secondary | ICD-10-CM | POA: Diagnosis present

## 2015-03-26 DIAGNOSIS — I480 Paroxysmal atrial fibrillation: Secondary | ICD-10-CM | POA: Diagnosis not present

## 2015-03-26 DIAGNOSIS — N400 Enlarged prostate without lower urinary tract symptoms: Secondary | ICD-10-CM | POA: Diagnosis not present

## 2015-03-26 DIAGNOSIS — J45909 Unspecified asthma, uncomplicated: Secondary | ICD-10-CM | POA: Diagnosis present

## 2015-03-26 DIAGNOSIS — F329 Major depressive disorder, single episode, unspecified: Secondary | ICD-10-CM | POA: Diagnosis not present

## 2015-03-26 DIAGNOSIS — Z955 Presence of coronary angioplasty implant and graft: Secondary | ICD-10-CM | POA: Diagnosis not present

## 2015-03-26 DIAGNOSIS — J9600 Acute respiratory failure, unspecified whether with hypoxia or hypercapnia: Secondary | ICD-10-CM | POA: Diagnosis not present

## 2015-03-26 DIAGNOSIS — D509 Iron deficiency anemia, unspecified: Secondary | ICD-10-CM | POA: Diagnosis present

## 2015-03-26 DIAGNOSIS — R6889 Other general symptoms and signs: Secondary | ICD-10-CM | POA: Diagnosis not present

## 2015-03-26 DIAGNOSIS — R404 Transient alteration of awareness: Secondary | ICD-10-CM | POA: Diagnosis not present

## 2015-03-26 DIAGNOSIS — G3183 Dementia with Lewy bodies: Secondary | ICD-10-CM | POA: Diagnosis not present

## 2015-03-26 DIAGNOSIS — I4891 Unspecified atrial fibrillation: Secondary | ICD-10-CM | POA: Diagnosis not present

## 2015-03-26 DIAGNOSIS — G9340 Encephalopathy, unspecified: Secondary | ICD-10-CM | POA: Diagnosis not present

## 2015-03-26 DIAGNOSIS — R531 Weakness: Secondary | ICD-10-CM | POA: Diagnosis not present

## 2015-03-26 DIAGNOSIS — M6281 Muscle weakness (generalized): Secondary | ICD-10-CM | POA: Diagnosis not present

## 2015-03-26 HISTORY — DX: Anemia, unspecified: D64.9

## 2015-03-26 HISTORY — DX: Parkinson's disease: G20

## 2015-03-26 HISTORY — DX: Thrombocytopenia, unspecified: D69.6

## 2015-03-26 HISTORY — DX: Unspecified atrial fibrillation: I48.91

## 2015-03-26 HISTORY — DX: Parkinson's disease without dyskinesia, without mention of fluctuations: G20.A1

## 2015-03-26 HISTORY — DX: Benign prostatic hyperplasia without lower urinary tract symptoms: N40.0

## 2015-03-26 HISTORY — DX: Unspecified dementia, unspecified severity, without behavioral disturbance, psychotic disturbance, mood disturbance, and anxiety: F03.90

## 2015-03-26 HISTORY — DX: Iron deficiency anemia, unspecified: D50.9

## 2015-03-26 HISTORY — DX: Major depressive disorder, single episode, unspecified: F32.9

## 2015-03-26 HISTORY — DX: Enterocolitis due to Clostridium difficile, not specified as recurrent: A04.72

## 2015-03-26 HISTORY — DX: Depression, unspecified: F32.A

## 2015-03-26 LAB — CBC WITH DIFFERENTIAL/PLATELET
BASOS ABS: 0 10*3/uL (ref 0.0–0.1)
BASOS PCT: 0 %
EOS ABS: 0 10*3/uL (ref 0.0–0.7)
Eosinophils Relative: 0 %
HCT: 37.5 % — ABNORMAL LOW (ref 39.0–52.0)
HEMOGLOBIN: 12.1 g/dL — AB (ref 13.0–17.0)
Lymphocytes Relative: 5 %
Lymphs Abs: 0.4 10*3/uL — ABNORMAL LOW (ref 0.7–4.0)
MCH: 30.5 pg (ref 26.0–34.0)
MCHC: 32.3 g/dL (ref 30.0–36.0)
MCV: 94.5 fL (ref 78.0–100.0)
Monocytes Absolute: 1 10*3/uL (ref 0.1–1.0)
Monocytes Relative: 10 %
NEUTROS PCT: 85 %
Neutro Abs: 8.1 10*3/uL — ABNORMAL HIGH (ref 1.7–7.7)
Platelets: 98 10*3/uL — ABNORMAL LOW (ref 150–400)
RBC: 3.97 MIL/uL — AB (ref 4.22–5.81)
RDW: 15 % (ref 11.5–15.5)
WBC: 9.5 10*3/uL (ref 4.0–10.5)

## 2015-03-26 LAB — URINE MICROSCOPIC-ADD ON: WBC, UA: NONE SEEN WBC/hpf (ref 0–5)

## 2015-03-26 LAB — URINALYSIS, ROUTINE W REFLEX MICROSCOPIC
Bilirubin Urine: NEGATIVE
Glucose, UA: NEGATIVE mg/dL
KETONES UR: NEGATIVE mg/dL
LEUKOCYTES UA: NEGATIVE
NITRITE: NEGATIVE
PROTEIN: NEGATIVE mg/dL
Specific Gravity, Urine: 1.006 (ref 1.005–1.030)
pH: 5.5 (ref 5.0–8.0)

## 2015-03-26 LAB — BASIC METABOLIC PANEL
Anion gap: 12 (ref 5–15)
BUN: 17 mg/dL (ref 6–20)
CALCIUM: 8.9 mg/dL (ref 8.9–10.3)
CHLORIDE: 96 mmol/L — AB (ref 101–111)
CO2: 22 mmol/L (ref 22–32)
CREATININE: 1.03 mg/dL (ref 0.61–1.24)
GFR calc non Af Amer: >60 mL/min (ref >60)
Glucose, Bld: 99 mg/dL (ref 65–99)
Potassium: 4.1 mmol/L (ref 3.5–5.1)
SODIUM: 130 mmol/L — AB (ref 135–145)

## 2015-03-26 LAB — STREP PNEUMONIAE URINARY ANTIGEN: STREP PNEUMO URINARY ANTIGEN: NEGATIVE

## 2015-03-26 LAB — INFLUENZA PANEL BY PCR (TYPE A & B)
H1N1FLUPCR: NOT DETECTED
INFLBPCR: NEGATIVE
Influenza A By PCR: POSITIVE — AB

## 2015-03-26 MED ORDER — DEXTROSE 5 % IV SOLN
500.0000 mg | INTRAVENOUS | Status: DC
  Administered 2015-03-26 – 2015-03-28 (×3): 500 mg via INTRAVENOUS
  Filled 2015-03-26 (×4): qty 500

## 2015-03-26 MED ORDER — ONDANSETRON HCL 4 MG/2ML IJ SOLN: 4.0000 mg | Freq: Four times a day (QID) | INTRAMUSCULAR | Status: DC | PRN

## 2015-03-26 MED ORDER — PAROXETINE HCL 20 MG PO TABS
10.0000 mg | ORAL_TABLET | Freq: Every day | ORAL | Status: DC
Start: 2015-03-26 — End: 2015-03-29
  Administered 2015-03-26 – 2015-03-29 (×4): 10 mg via ORAL
  Filled 2015-03-26 (×4): qty 1

## 2015-03-26 MED ORDER — IPRATROPIUM-ALBUTEROL 0.5-2.5 (3) MG/3ML IN SOLN
3.0000 mL | Freq: Four times a day (QID) | RESPIRATORY_TRACT | Status: DC | PRN
  Administered 2015-03-26 – 2015-03-28 (×3): 3 mL via RESPIRATORY_TRACT
  Filled 2015-03-26 (×2): qty 3

## 2015-03-26 MED ORDER — OSELTAMIVIR PHOSPHATE 75 MG PO CAPS
75.0000 mg | ORAL_CAPSULE | Freq: Two times a day (BID) | ORAL | Status: DC
  Administered 2015-03-26 – 2015-03-27 (×2): 75 mg via ORAL
  Filled 2015-03-26 (×3): qty 1

## 2015-03-26 MED ORDER — DONEPEZIL HCL 10 MG PO TABS
10.0000 mg | ORAL_TABLET | Freq: Every day | ORAL | Status: DC
  Administered 2015-03-26 – 2015-03-28 (×3): 10 mg via ORAL
  Filled 2015-03-26 (×3): qty 1

## 2015-03-26 MED ORDER — AMLODIPINE BESYLATE 5 MG PO TABS
2.5000 mg | ORAL_TABLET | Freq: Every day | ORAL | Status: DC
  Administered 2015-03-26 – 2015-03-29 (×4): 2.5 mg via ORAL
  Filled 2015-03-26 (×4): qty 1

## 2015-03-26 MED ORDER — APIXABAN 2.5 MG PO TABS
2.5000 mg | ORAL_TABLET | Freq: Two times a day (BID) | ORAL | Status: DC
  Administered 2015-03-26 – 2015-03-29 (×6): 2.5 mg via ORAL
  Filled 2015-03-26 (×6): qty 1

## 2015-03-26 MED ORDER — IPRATROPIUM-ALBUTEROL 0.5-2.5 (3) MG/3ML IN SOLN
3.0000 mL | RESPIRATORY_TRACT | Status: DC | PRN
  Filled 2015-03-26: qty 3

## 2015-03-26 MED ORDER — SODIUM CHLORIDE 0.9 % IV SOLN
Freq: Once | INTRAVENOUS | Status: AC
  Administered 2015-03-26: 11:00:00 via INTRAVENOUS

## 2015-03-26 MED ORDER — ACETAMINOPHEN 650 MG RE SUPP
650.0000 mg | Freq: Once | RECTAL | Status: AC
  Administered 2015-03-26: 650 mg via RECTAL
  Filled 2015-03-26: qty 1

## 2015-03-26 MED ORDER — DEXTROSE 5 % IV SOLN
1.0000 g | INTRAVENOUS | Status: DC
  Administered 2015-03-26: 1 g via INTRAVENOUS
  Filled 2015-03-26 (×2): qty 10

## 2015-03-26 MED ORDER — ALBUTEROL SULFATE (2.5 MG/3ML) 0.083% IN NEBU
2.5000 mg | INHALATION_SOLUTION | Freq: Two times a day (BID) | RESPIRATORY_TRACT | Status: DC
  Administered 2015-03-27 (×2): 2.5 mg via RESPIRATORY_TRACT
  Filled 2015-03-26 (×2): qty 3

## 2015-03-26 MED ORDER — TAMSULOSIN HCL 0.4 MG PO CAPS
0.4000 mg | ORAL_CAPSULE | Freq: Every day | ORAL | Status: DC
  Administered 2015-03-26 – 2015-03-29 (×4): 0.4 mg via ORAL
  Filled 2015-03-26 (×4): qty 1

## 2015-03-26 MED ORDER — CARBIDOPA-LEVODOPA 25-100 MG PO TABS
1.5000 | ORAL_TABLET | Freq: Four times a day (QID) | ORAL | Status: DC
  Administered 2015-03-26 – 2015-03-29 (×11): 1.5 via ORAL
  Filled 2015-03-26 (×14): qty 1.5

## 2015-03-26 MED ORDER — ADULT MULTIVITAMIN W/MINERALS CH
1.0000 | ORAL_TABLET | Freq: Every day | ORAL | Status: DC
  Administered 2015-03-26 – 2015-03-29 (×4): 1 via ORAL
  Filled 2015-03-26 (×7): qty 1

## 2015-03-26 MED ORDER — ACETAMINOPHEN 325 MG PO TABS
650.0000 mg | ORAL_TABLET | Freq: Once | ORAL | Status: DC
  Filled 2015-03-26: qty 2

## 2015-03-26 MED ORDER — ONDANSETRON HCL 4 MG PO TABS: 4.0000 mg | ORAL_TABLET | Freq: Four times a day (QID) | ORAL | Status: DC | PRN

## 2015-03-26 MED ORDER — SACCHAROMYCES BOULARDII 250 MG PO CAPS
250.0000 mg | ORAL_CAPSULE | Freq: Two times a day (BID) | ORAL | Status: DC
  Administered 2015-03-26 – 2015-03-29 (×6): 250 mg via ORAL
  Filled 2015-03-26 (×6): qty 1

## 2015-03-26 MED ORDER — ACETAMINOPHEN 325 MG PO TABS: 650.0000 mg | ORAL_TABLET | ORAL | Status: DC | PRN

## 2015-03-26 MED ORDER — LATANOPROST 0.005 % OP SOLN
1.0000 [drp] | Freq: Every day | OPHTHALMIC | Status: DC
  Administered 2015-03-26 – 2015-03-28 (×3): 1 [drp] via OPHTHALMIC
  Filled 2015-03-26: qty 2.5

## 2015-03-26 MED ORDER — SODIUM CHLORIDE 0.9 % IV SOLN
INTRAVENOUS | Status: DC
  Administered 2015-03-26: 18:00:00 via INTRAVENOUS
  Administered 2015-03-27: 1000 mL via INTRAVENOUS
  Administered 2015-03-28: 15:00:00 via INTRAVENOUS

## 2015-03-26 NOTE — Progress Notes (Signed)
ASPIRATION PRECAUTIONS 

## 2015-03-26 NOTE — ED Notes (Addendum)
Patient is from home with a complaint of increased symptoms of cold. Patient called primary on yesterday with complaint of fever and congestion. PCP called in for z-pak, but today the symptoms persist. Patient has history of parkinsons with what may be increased tremors on today and that his hip hurts. He denies injury and that the pain in his hip started a week ago with history of arthritis. Patient is stable at this time.

## 2015-03-26 NOTE — Progress Notes (Signed)
Utilization Review completed.  Josue Kass RN CM  
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
## 2015-03-26 NOTE — Progress Notes (Signed)
Pharmacy Antibiotic Follow-up Note  Henry Alvarez is a 82 y.o. year-old male admitted on 03/26/2015.  The patient is currently on day 1/7 of Azithromycin and Rocephin for PNA.  Assessment/Plan: This patient's current antibiotics will be continued without adjustments.  Temp (24hrs), Avg:99.4 F (37.4 C), Min:98.6 F (37 C), Max:100.2 F (37.9 C)   Recent Labs Lab 03/26/15 0801  WBC 9.5    Recent Labs Lab 03/26/15 0801  CREATININE 1.03   Estimated Creatinine Clearance: 45.6 mL/min (by C-G formula based on Cr of 1.03).    Allergies  Allergen Reactions  . Ciprocin-Fluocin-Procin [Fluocinolone]     Past reactions to "quinolones"-just not to have it per Family member.     Antimicrobials this admission: 2/17 Azithromycin  >> x 7 2/17 Rocephin >> x7  Pharmacy will sign off protocol, neither abx is renally excreted and needs no adjustment for renal function  Thank you for allowing pharmacy to be a part of this patient's care.  Minda Ditto PharmD 03/26/2015 2:51 PM

## 2015-03-27 ENCOUNTER — Encounter (HOSPITAL_COMMUNITY): Payer: Self-pay | Admitting: *Deleted

## 2015-03-27 DIAGNOSIS — A047 Enterocolitis due to Clostridium difficile: Secondary | ICD-10-CM

## 2015-03-27 DIAGNOSIS — G20 Parkinson's disease: Secondary | ICD-10-CM

## 2015-03-27 DIAGNOSIS — J9601 Acute respiratory failure with hypoxia: Secondary | ICD-10-CM

## 2015-03-27 DIAGNOSIS — I10 Essential (primary) hypertension: Secondary | ICD-10-CM

## 2015-03-27 DIAGNOSIS — I480 Paroxysmal atrial fibrillation: Secondary | ICD-10-CM

## 2015-03-27 DIAGNOSIS — D696 Thrombocytopenia, unspecified: Secondary | ICD-10-CM

## 2015-03-27 DIAGNOSIS — J1189 Influenza due to unidentified influenza virus with other manifestations: Secondary | ICD-10-CM

## 2015-03-27 DIAGNOSIS — D509 Iron deficiency anemia, unspecified: Secondary | ICD-10-CM

## 2015-03-27 LAB — CBC
HEMATOCRIT: 35.8 % — AB (ref 39.0–52.0)
HEMOGLOBIN: 11.5 g/dL — AB (ref 13.0–17.0)
MCH: 30.7 pg (ref 26.0–34.0)
MCHC: 32.1 g/dL (ref 30.0–36.0)
MCV: 95.7 fL (ref 78.0–100.0)
Platelets: 103 10*3/uL — ABNORMAL LOW (ref 150–400)
RBC: 3.74 MIL/uL — AB (ref 4.22–5.81)
RDW: 15.4 % (ref 11.5–15.5)
WBC: 10.2 10*3/uL (ref 4.0–10.5)

## 2015-03-27 LAB — BASIC METABOLIC PANEL
ANION GAP: 10 (ref 5–15)
BUN: 18 mg/dL (ref 6–20)
CALCIUM: 8.2 mg/dL — AB (ref 8.9–10.3)
CHLORIDE: 100 mmol/L — AB (ref 101–111)
CO2: 24 mmol/L (ref 22–32)
Creatinine, Ser: 1.14 mg/dL (ref 0.61–1.24)
GFR, EST NON AFRICAN AMERICAN: 55 mL/min — AB (ref >60)
Glucose, Bld: 105 mg/dL — ABNORMAL HIGH (ref 65–99)
Potassium: 3.6 mmol/L (ref 3.5–5.1)
SODIUM: 134 mmol/L — AB (ref 135–145)

## 2015-03-27 LAB — TSH: TSH: 1.865 u[IU]/mL (ref 0.350–4.500)

## 2015-03-27 LAB — GLUCOSE, CAPILLARY: GLUCOSE-CAPILLARY: 86 mg/dL (ref 65–99)

## 2015-03-27 MED ORDER — OSELTAMIVIR PHOSPHATE 30 MG PO CAPS
30.0000 mg | ORAL_CAPSULE | Freq: Two times a day (BID) | ORAL | Status: DC
  Administered 2015-03-27 – 2015-03-29 (×4): 30 mg via ORAL
  Filled 2015-03-27 (×5): qty 1

## 2015-03-27 MED ORDER — GUAIFENESIN ER 600 MG PO TB12
1200.0000 mg | ORAL_TABLET | Freq: Two times a day (BID) | ORAL | Status: DC
  Administered 2015-03-27 – 2015-03-29 (×4): 1200 mg via ORAL
  Filled 2015-03-27 (×4): qty 2

## 2015-03-27 NOTE — Progress Notes (Signed)
PT Cancellation Note  Patient Details Name: Henry Alvarez MRN: [id number] DOB: December 04, 1924   Cancelled Treatment:    Reason Eval/Treat Not Completed: Patient declined, no reason specified. Pt requested PT check back another day.    Weston Anna, MPT Pager: [phone]

## 2015-03-27 NOTE — Progress Notes (Signed)
Influenza positive, added tamiflu  Henry Alvarez

## 2015-03-27 NOTE — Progress Notes (Signed)
Patient Demographics  Henry Alvarez, is a 82 y.o. male, DOB - 01/08/1925, [id number]  Admit date - 03/26/2015   Admitting Physician [name], MD  Outpatient Primary MD for the patient is Henry Stack, MD  LOS - 1   Chief Complaint  Patient presents with  . Fever  . Nasal Congestion       Admission HPI/Brief narrative: 82 year old male with history of hypertension, Parkinson disease, dementia, A. fib on Eliquis, presents from SNF for congestion, cough, chest x-ray with no acute cardiopulmonary finding, low-grade temperature 100.2, workup significant for influenza, and bronchitis. Subjective:   [name] today has, No headache, No chest pain, complains of cough, nonproductive . Assessment & Plan    Principal Problem:   Acute respiratory failure with hypoxia (HCC) Active Problems:   Paroxysmal atrial fibrillation (HCC)   Long-term (current) use of anticoagulants   Parkinson's disease (HCC)   Enteritis due to Clostridium difficile   Depression   Thrombocytopenia (HCC)   Lobar pneumonia, unspecified organism (HCC)   Benign essential HTN   BPH (benign prostatic hyperplasia)   IDA (iron deficiency anemia)  Acute respiratory failure with hypoxia (HCC) - No acute cardiopulmonary finding on CXR , most likely related to acute bronchitis and influenza. supposed to be on azithromycin through 2/20 will start empiric rocephin and Zithromax  - Follow up blood cultures,legionella, strep pneumonia results - Patient with significant congestion, will start flutter valves, Mucinex and pulmonary toilet  Influenza - Started on Tamiflu  Acute bronchitis - Continue that azithromycin  Paroxysmal atrial fibrillation (HCC) / Long-term (current) use of anticoagulants - CHADS vasc score 2-3,  On AC with Apixaban - Bradycardic in 55 HR range and not on BB at hoem   Parkinson's disease  ([place]) - Continue sinemet   Enteritis due to Clostridium difficile - Continue florastor   Depression - Continue paxil   Thrombocytopenia (HCC) - Likely from eliquis - He hs chronic thrombocytopenia and in past moth or so his platelets in 110's   Benign essential HTN - Continue Norvasc   BPH (benign prostatic hyperplasia) - Continue Flmax   IDA (iron deficiency anemia) - Continue ferrous sulfate supplementation   Code Status: Full  Family Communication: None at bedside  Disposition Plan: Back to SNF once stable   Procedures  None   Consults   None   Medications  Scheduled Meds: . albuterol  2.5 mg Nebulization BID  . amLODipine  2.5 mg Oral Daily  . apixaban  2.5 mg Oral BID  . azithromycin  500 mg Intravenous Q24H  . carbidopa-levodopa  1.5 tablet Oral QID  . donepezil  10 mg Oral QHS  . guaiFENesin  1,200 mg Oral BID  . latanoprost  1 drop Both Eyes QHS  . multivitamin with minerals  1 tablet Oral Daily  . oseltamivir  75 mg Oral BID  . PARoxetine  10 mg Oral Daily  . saccharomyces boulardii  250 mg Oral BID  . tamsulosin  0.4 mg Oral Daily   Continuous Infusions: . sodium chloride 1,000 mL (03/27/15 0625)   PRN Meds:.acetaminophen, ipratropium-albuterol, ondansetron **OR** ondansetron (ZOFRAN) IV  DVT Prophylaxis on Eliquis  Lab Results  Component Value Date   PLT 103* 03/27/2015  Antibiotics   Anti-infectives    Start     Dose/Rate Route Frequency Ordered Stop   03/26/15 2200  oseltamivir (TAMIFLU) capsule 75 mg     75 mg Oral 2 times daily 03/26/15 1804 03/31/15 2159   03/26/15 1445  cefTRIAXone (ROCEPHIN) 1 g in dextrose 5 % 50 mL IVPB  Status:  Discontinued     1 g 100 mL/hr over 30 Minutes Intravenous Every 24 hours 03/26/15 1433 03/27/15 0735   03/26/15 1445  azithromycin (ZITHROMAX) 500 mg in dextrose 5 % 250 mL IVPB     500 mg 250 mL/hr over 60 Minutes Intravenous Every 24 hours 03/26/15 1433 04/02/15 1444           Objective:   Filed Vitals:   03/26/15 2238 03/27/15 0200 03/27/15 0300 03/27/15 0741  BP:   122/60   Pulse:   61 68  Temp: 98.4 F (36.9 C) 99 F (37.2 C) 97.6 F (36.4 C)   TempSrc: Oral Oral Oral   Resp:   18 17  Height:      Weight:   74.5 kg (164 lb 3.9 oz)   SpO2:   93% 95%    Wt Readings from Last 3 Encounters:  03/27/15 74.5 kg (164 lb 3.9 oz)  03/15/15 73.936 kg (163 lb)  03/12/15 72.122 kg (159 lb)     Intake/Output Summary (Last 24 hours) at 03/27/15 1310 Last data filed at 03/27/15 0626  Gross per 24 hour  Intake   1554 ml  Output      0 ml  Net   1554 ml     Physical Exam  Awake Alert,frail  [place].AT,PERRAL Supple Neck,No JVD,  Symmetrical Chest wall movement, Good air movement bilaterally,upper airway congestion  No Gallops,Rubs or new Murmurs, No Parasternal Heave +ve B.Sounds, Abd Soft, No tenderness, No organomegaly appriciated, No rebound - guarding or rigidity. No Cyanosis, Clubbing or edema, No new Rash or bruise     Data Review   Micro Results No results found for this or any previous visit (from the past 240 hour(s)).  Radiology Reports Dg Chest 2 View  03/26/2015  CLINICAL DATA:  Fever. EXAM: CHEST  2 VIEW COMPARISON:  February 25, 2015. FINDINGS: Stable cardiomediastinal silhouette. No pneumothorax or pleural effusion is noted. No acute pulmonary disease is noted. Status post kyphoplasty of lower thoracic vertebral body. IMPRESSION: No active cardiopulmonary disease. Electronically Signed   By: Marijo Conception, M.D.   On: 03/26/2015 08:50   Ct Head Wo Contrast  03/26/2015  CLINICAL DATA:  Altered loss of consciousness.  Eliquis use EXAM: CT HEAD WITHOUT CONTRAST TECHNIQUE: Contiguous axial images were obtained from the base of the skull through the vertex without intravenous contrast. COMPARISON:  02/24/2015 FINDINGS: Skull and Sinuses:Negative for fracture or destructive process. The visualized mastoids, middle ears, and imaged paranasal  sinuses are clear. Visualized orbits: Negative. Brain: No evidence of acute infarction, hemorrhage, hydrocephalus, or mass lesion/mass effect. Generalized cerebral volume loss, mild for age. Chronic small-vessel disease with confluent ischemic gliosis in the deep cerebral white matter. Calcified intracranial atherosclerosis, extensive in the vertebral arteries. IMPRESSION: 1. No acute finding. 2. Moderate chronic small vessel disease. Electronically Signed   By: Monte Fantasia M.D.   On: 03/26/2015 10:32     CBC  Recent Labs Lab 03/26/15 0801 03/27/15 0358  WBC 9.5 10.2  HGB 12.1* 11.5*  HCT 37.5* 35.8*  PLT 98* 103*  MCV 94.5 95.7  MCH 30.5 30.7  MCHC  32.3 32.1  RDW 15.0 15.4  LYMPHSABS 0.4*  --   MONOABS 1.0  --   EOSABS 0.0  --   BASOSABS 0.0  --     Chemistries   Recent Labs Lab 03/26/15 0801 03/27/15 0358  NA 130* 134*  K 4.1 3.6  CL 96* 100*  CO2 22 24  GLUCOSE 99 105*  BUN 17 18  CREATININE 1.03 1.14  CALCIUM 8.9 8.2*   ------------------------------------------------------------------------------------------------------------------ estimated creatinine clearance is 41.9 mL/min (by C-G formula based on Cr of 1.14). ------------------------------------------------------------------------------------------------------------------ No results for input(s): HGBA1C in the last 72 hours. ------------------------------------------------------------------------------------------------------------------ No results for input(s): CHOL, HDL, LDLCALC, TRIG, CHOLHDL, LDLDIRECT in the last 72 hours. ------------------------------------------------------------------------------------------------------------------  Recent Labs  03/27/15 0358  TSH 1.865   ------------------------------------------------------------------------------------------------------------------ No results for input(s): VITAMINB12, FOLATE, FERRITIN, TIBC, IRON, RETICCTPCT in the last 72  hours.  Coagulation profile No results for input(s): INR, PROTIME in the last 168 hours.  No results for input(s): DDIMER in the last 72 hours.  Cardiac Enzymes No results for input(s): CKMB, TROPONINI, MYOGLOBIN in the last 168 hours.  Invalid input(s): CK ------------------------------------------------------------------------------------------------------------------ Invalid input(s): POCBNP     Time Spent in minutes   30 minutes   Nayelis Bonito M.D on 03/27/2015 at 1:10 PM  Between 7am to 7pm - Pager - [phone]  After 7pm go to [email or web address] - password [email or web address]  [facility]   Office  [phone]

## 2015-03-28 DIAGNOSIS — J111 Influenza due to unidentified influenza virus with other respiratory manifestations: Secondary | ICD-10-CM

## 2015-03-28 LAB — CBC
HCT: 33.6 % — ABNORMAL LOW (ref 39.0–52.0)
HEMOGLOBIN: 10.8 g/dL — AB (ref 13.0–17.0)
MCH: 30.6 pg (ref 26.0–34.0)
MCHC: 32.1 g/dL (ref 30.0–36.0)
MCV: 95.2 fL (ref 78.0–100.0)
Platelets: 91 10*3/uL — ABNORMAL LOW (ref 150–400)
RBC: 3.53 MIL/uL — AB (ref 4.22–5.81)
RDW: 15.3 % (ref 11.5–15.5)
WBC: 6.2 10*3/uL (ref 4.0–10.5)

## 2015-03-28 LAB — BASIC METABOLIC PANEL
Anion gap: 8 (ref 5–15)
BUN: 20 mg/dL (ref 6–20)
CO2: 27 mmol/L (ref 22–32)
CREATININE: 1.09 mg/dL (ref 0.61–1.24)
Calcium: 8.1 mg/dL — ABNORMAL LOW (ref 8.9–10.3)
Chloride: 101 mmol/L (ref 101–111)
GFR calc Af Amer: >60 mL/min (ref >60)
GFR calc non Af Amer: 59 mL/min — ABNORMAL LOW (ref >60)
GLUCOSE: 94 mg/dL (ref 65–99)
Potassium: 3.4 mmol/L — ABNORMAL LOW (ref 3.5–5.1)
Sodium: 136 mmol/L (ref 135–145)

## 2015-03-28 LAB — URINE CULTURE

## 2015-03-28 LAB — GLUCOSE, CAPILLARY: Glucose-Capillary: 97 mg/dL (ref 65–99)

## 2015-03-28 MED ORDER — ENSURE ENLIVE PO LIQD
237.0000 mL | Freq: Two times a day (BID) | ORAL | Status: DC
  Administered 2015-03-28 – 2015-03-29 (×2): 237 mL via ORAL

## 2015-03-28 NOTE — Clinical Social Work Placement (Signed)
   CLINICAL SOCIAL WORK PLACEMENT  NOTE  Date:  03/28/2015  Patient Details  Name: CREDENCE GIANNOTTI MRN: [id number] Date of Birth: 09-02-1924  Clinical Social Work is seeking post-discharge placement for this patient at the [place] level of care (*CSW will initial, date and re-position this form in  chart as items are completed):  Yes   Patient/family provided with [place] Work Department's list of facilities offering this level of care within the geographic area requested by the patient (or if unable, by the patient's family).  Yes   Patient/family informed of their freedom to choose among providers that offer the needed level of care, that participate in Medicare, Medicaid or managed care program needed by the patient, have an available bed and are willing to accept the patient.  Yes   Patient/family informed of [facility]'s ownership interest in [facility] and [facility], as well as of the fact that they are under no obligation to receive care at these facilities.  PASRR submitted to EDS on       PASRR number received on       Existing PASRR number confirmed on 03/28/15     FL2 transmitted to all facilities in geographic area requested by pt/family on 03/28/15     FL2 transmitted to all facilities within larger geographic area on       Patient informed that his/her managed care company has contracts with or will negotiate with certain facilities, including the following:            Patient/family informed of bed offers received.  Patient chooses bed at       Physician recommends and patient chooses bed at      Patient to be transferred to   on  .  Patient to be transferred to facility by       Patient family notified on   of transfer.  Name of family member notified:        PHYSICIAN Please prepare priority discharge summary, including medications, Please prepare prescriptions, Please sign FL2     Additional Comment:     _______________________________________________ Rigoberto Noel, LCSW 03/28/2015, 2:20 PM

## 2015-03-28 NOTE — Progress Notes (Signed)
Flutter initiated. Patient has good effort and tolerated well. Non productive, congested cough.

## 2015-03-28 NOTE — Progress Notes (Addendum)
Patient Demographics  Kyheem Boulos, is a 82 y.o. male, DOB - 1925-01-21, [id number]  Admit date - 03/26/2015   Admitting Physician [name], MD  Outpatient Primary MD for the patient is Sheela Stack, MD  LOS - 2   Chief Complaint  Patient presents with  . Fever  . Nasal Congestion       Admission HPI/Brief narrative: 82 year old male with history of hypertension, Parkinson disease, dementia, A. fib on Eliquis, presents from SNF for congestion, cough, chest x-ray with no acute cardiopulmonary finding, low-grade temperature 100.2, workup significant for influenza, and bronchitis. Subjective:   [name] today has, No headache, No chest pain, complains of cough, nonproductive . Assessment & Plan    Principal Problem:   Acute respiratory failure with hypoxia (HCC) Active Problems:   Paroxysmal atrial fibrillation (HCC)   Long-term (current) use of anticoagulants   Parkinson's disease (HCC)   Enteritis due to Clostridium difficile   Depression   Thrombocytopenia (HCC)   Benign essential HTN   BPH (benign prostatic hyperplasia)   IDA (iron deficiency anemia)  Acute respiratory failure with hypoxia (HCC) - No acute cardiopulmonary finding on CXR , most likely related to acute bronchitis and influenza. supposed to be on azithromycin through 2/20 ccontinue with azithromycin, discontinue Rocephin as no evidence of pneumonia especially in the setting of ST of C. difficile - legionella pending, strep pneumonia negative - Patient with significant congestion,continue with  flutter valves, Mucinex and pulmonary toilet  Influenza - on Tamiflu  Acute bronchitis - Continue that azithromycin  Paroxysmal atrial fibrillation (HCC) / Long-term (current) use of anticoagulants - CHADS vasc score 2-3,  On AC with Apixaban - Bradycardic in 55 HR range and not on BB at hoem    Parkinson's disease ([place]) - Continue sinemet   Enteritis due to Clostridium difficile - Continue florastor   Depression - Continue paxil   Thrombocytopenia (HCC) - Likely from eliquis - chronic thrombocytopenia and in past moth or so his platelets in 110's   Benign essential HTN - Continue Norvasc   BPH (benign prostatic hyperplasia) - Continue Flmax   IDA (iron deficiency anemia) - Continue ferrous sulfate supplementation   Code Status: Full  Family Communication: None at bedside  Disposition Plan: SNF tomorrow when bed is available   Procedures  None   Consults   None   Medications  Scheduled Meds: . amLODipine  2.5 mg Oral Daily  . apixaban  2.5 mg Oral BID  . azithromycin  500 mg Intravenous Q24H  . carbidopa-levodopa  1.5 tablet Oral QID  . donepezil  10 mg Oral QHS  . feeding supplement (ENSURE ENLIVE)  237 mL Oral BID BM  . guaiFENesin  1,200 mg Oral BID  . latanoprost  1 drop Both Eyes QHS  . multivitamin with minerals  1 tablet Oral Daily  . oseltamivir  30 mg Oral BID  . PARoxetine  10 mg Oral Daily  . saccharomyces boulardii  250 mg Oral BID  . tamsulosin  0.4 mg Oral Daily   Continuous Infusions: . sodium chloride 50 mL/hr at 03/28/15 1515   PRN Meds:.acetaminophen, ipratropium-albuterol, ondansetron **OR** ondansetron (ZOFRAN) IV  DVT Prophylaxis on Eliquis  Lab Results  Component Value Date  PLT 91* 03/28/2015    Antibiotics   Anti-infectives    Start     Dose/Rate Route Frequency Ordered Stop   03/27/15 2200  oseltamivir (TAMIFLU) capsule 30 mg     30 mg Oral 2 times daily 03/27/15 1646 03/31/15 2159   03/26/15 2200  oseltamivir (TAMIFLU) capsule 75 mg  Status:  Discontinued     75 mg Oral 2 times daily 03/26/15 1804 03/27/15 1646   03/26/15 1445  cefTRIAXone (ROCEPHIN) 1 g in dextrose 5 % 50 mL IVPB  Status:  Discontinued     1 g 100 mL/hr over 30 Minutes Intravenous Every 24 hours 03/26/15 1433 03/27/15 0735    03/26/15 1445  azithromycin (ZITHROMAX) 500 mg in dextrose 5 % 250 mL IVPB     500 mg 250 mL/hr over 60 Minutes Intravenous Every 24 hours 03/26/15 1433 04/02/15 1444          Objective:   Filed Vitals:   03/27/15 0741 03/27/15 1500 03/27/15 2116 03/28/15 0532  BP:  132/74 126/65 169/67  Pulse: 68 60 57 56  Temp:  98.5 F (36.9 C) 97.8 F (36.6 C) 97.8 F (36.6 C)  TempSrc:  Oral Oral Oral  Resp: 17 18 28    Height:      Weight:      SpO2: 95% 98% 96% 95%    Wt Readings from Last 3 Encounters:  03/27/15 74.5 kg (164 lb 3.9 oz)  03/15/15 73.936 kg (163 lb)  03/12/15 72.122 kg (159 lb)     Intake/Output Summary (Last 24 hours) at 03/28/15 1529 Last data filed at 03/28/15 0900  Gross per 24 hour  Intake    220 ml  Output      0 ml  Net    220 ml     Physical Exam  Awake Alert,frail  [place].AT,PERRAL Supple Neck,No JVD,  Symmetrical Chest wall movement, Good air movement bilaterally,upper airway congestion  No Gallops,Rubs or new Murmurs, No Parasternal Heave +ve B.Sounds, Abd Soft, No tenderness, No organomegaly appriciated, No rebound - guarding or rigidity. No Cyanosis, Clubbing or edema, No new Rash or bruise     Data Review   Micro Results Recent Results (from the past 240 hour(s))  Urine culture     Status: None   Collection Time: 03/26/15 10:56 PM  Result Value Ref Range Status   Specimen Description URINE, CLEAN CATCH  Final   Special Requests NONE  Final   Culture   Final    MULTIPLE SPECIES PRESENT, SUGGEST RECOLLECTION Performed at [facility]    Report Status 03/28/2015 FINAL  Final    Radiology Reports Dg Chest 2 View  03/26/2015  CLINICAL DATA:  Fever. EXAM: CHEST  2 VIEW COMPARISON:  February 25, 2015. FINDINGS: Stable cardiomediastinal silhouette. No pneumothorax or pleural effusion is noted. No acute pulmonary disease is noted. Status post kyphoplasty of lower thoracic vertebral body. IMPRESSION: No active cardiopulmonary disease.  Electronically Signed   By: Marijo Conception, M.D.   On: 03/26/2015 08:50   Ct Head Wo Contrast  03/26/2015  CLINICAL DATA:  Altered loss of consciousness.  Eliquis use EXAM: CT HEAD WITHOUT CONTRAST TECHNIQUE: Contiguous axial images were obtained from the base of the skull through the vertex without intravenous contrast. COMPARISON:  02/24/2015 FINDINGS: Skull and Sinuses:Negative for fracture or destructive process. The visualized mastoids, middle ears, and imaged paranasal sinuses are clear. Visualized orbits: Negative. Brain: No evidence of acute infarction, hemorrhage, hydrocephalus, or mass lesion/mass effect.  Generalized cerebral volume loss, mild for age. Chronic small-vessel disease with confluent ischemic gliosis in the deep cerebral white matter. Calcified intracranial atherosclerosis, extensive in the vertebral arteries. IMPRESSION: 1. No acute finding. 2. Moderate chronic small vessel disease. Electronically Signed   By: Monte Fantasia M.D.   On: 03/26/2015 10:32     CBC  Recent Labs Lab 03/26/15 0801 03/27/15 0358 03/28/15 0421  WBC 9.5 10.2 6.2  HGB 12.1* 11.5* 10.8*  HCT 37.5* 35.8* 33.6*  PLT 98* 103* 91*  MCV 94.5 95.7 95.2  MCH 30.5 30.7 30.6  MCHC 32.3 32.1 32.1  RDW 15.0 15.4 15.3  LYMPHSABS 0.4*  --   --   MONOABS 1.0  --   --   EOSABS 0.0  --   --   BASOSABS 0.0  --   --     Chemistries   Recent Labs Lab 03/26/15 0801 03/27/15 0358 03/28/15 0421  NA 130* 134* 136  K 4.1 3.6 3.4*  CL 96* 100* 101  CO2 22 24 27   GLUCOSE 99 105* 94  BUN 17 18 20   CREATININE 1.03 1.14 1.09  CALCIUM 8.9 8.2* 8.1*   ------------------------------------------------------------------------------------------------------------------ estimated creatinine clearance is 43.8 mL/min (by C-G formula based on Cr of 1.09). ------------------------------------------------------------------------------------------------------------------ No results for input(s): HGBA1C in the last 72  hours. ------------------------------------------------------------------------------------------------------------------ No results for input(s): CHOL, HDL, LDLCALC, TRIG, CHOLHDL, LDLDIRECT in the last 72 hours. ------------------------------------------------------------------------------------------------------------------  Recent Labs  03/27/15 0358  TSH 1.865   ------------------------------------------------------------------------------------------------------------------ No results for input(s): VITAMINB12, FOLATE, FERRITIN, TIBC, IRON, RETICCTPCT in the last 72 hours.  Coagulation profile No results for input(s): INR, PROTIME in the last 168 hours.  No results for input(s): DDIMER in the last 72 hours.  Cardiac Enzymes No results for input(s): CKMB, TROPONINI, MYOGLOBIN in the last 168 hours.  Invalid input(s): CK ------------------------------------------------------------------------------------------------------------------ Invalid input(s): POCBNP     Time Spent in minutes   20 minutes   David Towson M.D on 03/28/2015 at 3:29 PM  Between 7am to 7pm - Pager - [phone]  After 7pm go to [email or web address] - password [email or web address]  [facility]   Office  [phone]

## 2015-03-28 NOTE — Clinical Social Work Note (Signed)
Clinical Social Work Assessment  Patient Details  Name: Henry Alvarez MRN: [id number] Date of Birth: [birth year]  Date of referral:  03/28/15               Reason for consult:  Facility Placement, Discharge Planning                Permission sought to share information with:  Facility Sport and exercise psychologist, Family Supports Permission granted to share information::  Yes, Verbal Permission Granted  Name::     Investment banker, corporate::  SNFs  Relationship::     Contact Information:     Housing/Transportation Living arrangements for the past 2 months:  [place] of Information:  Patient, Adult Children Patient Interpreter Needed:  None Criminal Activity/Legal Involvement Pertinent to Current Situation/Hospitalization:  No - Comment as needed Significant Relationships:  Adult Children Lives with:  Self, Adult Children Do you feel safe going back to the place where you live?  Yes Need for family participation in patient care:  Yes (Comment)  Care giving concerns:  Patient and family do not report any care giving concerns at this time.    Social Worker assessment / plan: CSW met with patient at bedside to complete assessment. Patient and patient's family plan for the patient to [facility] to [facility] at discharge. The patient was discharged home from [facility] on 03/12/15. CSW explained SNF search/placement process and answered the patient and son's questions. Son [name] has been including per patient's request. CSW will assist with DC.   Employment status:  Retired Forensic scientist:  Medicare PT Recommendations:  [place] / Referral to community resources:  [place]  Patient/Family's Response to care:  Patient and family appear happy with the care the patient has been receiving and are appreciative of CSW assistance.   Patient/Family's Understanding of and Emotional Response to Diagnosis, Current Treatment, and Prognosis:   Patient and family appear to have a good understanding of the reason for the patient's admission and post DC needs.   Emotional Assessment Appearance:  Appears stated age Attitude/Demeanor/Rapport:  Other (Patient is appropriate and welcoming of CSW.) Affect (typically observed):  Accepting, Appropriate, Calm, Pleasant Orientation:  Oriented to Self, Oriented to Place, Oriented to  Time Alcohol / Substance use:  Never Used Psych involvement (Current and /or in the community):  No (Comment)  Discharge Needs  Concerns to be addressed:  Discharge Planning Concerns Readmission within the last 30 days:  No Current discharge risk:  Chronically ill, Physical Impairment Barriers to Discharge:  Continued Medical Work up   Rigoberto Noel, LCSW 03/28/2015, 1:36 PM

## 2015-03-28 NOTE — NC FL2 (Signed)
[place] MEDICAID FL2 LEVEL OF CARE SCREENING TOOL     IDENTIFICATION  Patient Name: Henry Alvarez Birthdate: 1924/10/13 Sex: male Admission Date (Current Location): 03/26/2015  [facility] and [place] Number:  Herbalist and Address:  [facility],  [place] [address] [place]      Provider Number: [id number]  Attending Physician Name and Address:  Albertine Patricia, MD  Relative Name and Phone Number:       Current Level of Care: Hospital Recommended Level of Care: [place] Prior Approval Number:    Date Approved/Denied:   PASRR Number: [id number]  Discharge Plan: SNF    Current Diagnoses: Patient Active Problem List   Diagnosis Date Noted  . Benign essential HTN 03/26/2015  . BPH (benign prostatic hyperplasia) 03/26/2015  . IDA (iron deficiency anemia) 03/26/2015  . Acute respiratory failure with hypoxia ([place]) 03/26/2015  . Thrombocytopenia ([place]) 11/02/2014  . Enteritis due to Clostridium difficile 09/22/2013  . Depression 09/22/2013  . Parkinson's disease ([place]) 08/20/2012  . Paroxysmal atrial fibrillation (HCC)   . Long-term (current) use of anticoagulants     Orientation RESPIRATION BLADDER Height & Weight     Self, Time, Situation  Normal Incontinent Weight: 74.5 kg (164 lb 3.9 oz) Height:  5\' 7"  (170.2 cm)  BEHAVIORAL SYMPTOMS/MOOD NEUROLOGICAL BOWEL NUTRITION STATUS   (NONE)  (NONE) Continent Diet (Regular)  AMBULATORY STATUS COMMUNICATION OF NEEDS Skin   Extensive Assist Verbally Normal                       Personal Care Assistance Level of Assistance  Bathing, Feeding, Dressing Bathing Assistance: Limited assistance Feeding assistance: Limited assistance Dressing Assistance: Limited assistance     Functional Limitations Info  Sight, Hearing, Speech Sight Info: Adequate Hearing Info: Adequate Speech Info: Adequate    SPECIAL CARE FACTORS FREQUENCY  PT (By licensed PT), OT (By licensed  OT)     PT Frequency: 5/week OT Frequency: 5/week            Contractures Contractures Info: Not present    Additional Factors Info  Code Status, Allergies Code Status Info: Full Allergies Info: Ciprocin-fluocin-procin           Current Medications (03/28/2015):  This is the current hospital active medication list Current Facility-Administered Medications  Medication Dose Route Frequency Provider Last Rate Last Dose  . 0.9 %  sodium chloride infusion   Intravenous Continuous Albertine Patricia, MD 50 mL/hr at 03/27/15 1309    . acetaminophen (TYLENOL) tablet 650 mg  650 mg Oral Q4H PRN [name], MD      . amLODipine [facility]) tablet 2.5 mg  2.5 mg Oral Daily [name], MD   2.5 mg at 03/28/15 1053  . apixaban (ELIQUIS) tablet 2.5 mg  2.5 mg Oral BID [name], MD   2.5 mg at 03/28/15 1053  . azithromycin (ZITHROMAX) 500 mg in dextrose 5 % 250 mL IVPB  500 mg Intravenous Q24H [name], MD   500 mg at 03/27/15 1436  . carbidopa-levodopa (SINEMET IR) 25-100 MG per tablet immediate release 1.5 tablet  1.5 tablet Oral QID [name], MD   1.5 tablet at 03/28/15 1056  . donepezil (ARICEPT) tablet 10 mg  10 mg Oral QHS [name], MD   10 mg at 03/27/15 2203  . feeding supplement (ENSURE ENLIVE) (ENSURE ENLIVE) liquid 237 mL  237 mL Oral BID BM  Albertine Patricia, MD      . guaiFENesin (MUCINEX) 12 hr tablet 1,200 mg  1,200 mg Oral BID Albertine Patricia, MD   1,200 mg at 03/28/15 1053  . ipratropium-albuterol (DUONEB) 0.5-2.5 (3) MG/3ML nebulizer solution 3 mL  3 mL Nebulization Q6H PRN [name], MD   3 mL at 03/26/15 2136  . latanoprost (XALATAN) 0.005 % ophthalmic solution 1 drop  1 drop Both Eyes QHS [name], MD   1 drop at 03/27/15 2203  . multivitamin with minerals tablet 1 tablet  1 tablet Oral Daily [name], MD   1 tablet at 03/28/15 1053  . ondansetron (ZOFRAN) tablet 4 mg  4 mg Oral Q6H PRN [name], MD       Or  . ondansetron  [facility]) injection 4 mg  4 mg Intravenous Q6H PRN [name], MD      . oseltamivir (TAMIFLU) capsule 30 mg  30 mg Oral BID Albertine Patricia, MD   30 mg at 03/28/15 1057  . PARoxetine (PAXIL) tablet 10 mg  10 mg Oral Daily [name], MD   10 mg at 03/28/15 1055  . saccharomyces boulardii (FLORASTOR) capsule 250 mg  250 mg Oral BID [name], MD   250 mg at 03/28/15 1053  . tamsulosin (FLOMAX) capsule 0.4 mg  0.4 mg Oral Daily [name], MD   0.4 mg at 03/28/15 1053     Discharge Medications: Please see discharge summary for a list of discharge medications.  Relevant Imaging Results:  Relevant Lab Results:   Additional Information SSN: [id number]  Patient is on Droplet precautions for positive Flu A. First Tamiflu dose received on 03/26/15 at 2101.  Rigoberto Noel, LCSW

## 2015-03-28 NOTE — Progress Notes (Signed)
Initial Nutrition Assessment  DOCUMENTATION CODES:   Non-severe (moderate) malnutrition in context of chronic illness  INTERVENTION:   -Recommend Speech evaluation d/t observed difficulty swallowing per staff and need for nectar thick liquids PTA. -Adjust fluid consistency to nectar thick -Provide Ensure Enlive po BID, each supplement provides 350 kcal and 20 grams of protein -RD to continue to monitor  NUTRITION DIAGNOSIS:   Inadequate oral intake related to poor appetite (trouble swallowing) as evidenced by meal completion < 50%.  GOAL:   Patient will meet greater than or equal to 90% of their needs  MONITOR:   PO intake, Supplement acceptance, Labs, Weight trends, I & O's  REASON FOR ASSESSMENT:   Consult Assessment of nutrition requirement/status (diet)  ASSESSMENT:   82 year old male with history of hypertension, Parkinson disease, dementia, A. fib on Eliquis, presents from SNF for congestion, cough, chest x-ray with no acute cardiopulmonary finding, low-grade temperature 100.2, workup significant for influenza, and bronchitis.  Patient in room asleep with no family at bedside. Pt with mild muscle and fat depletion present. Pt unable to provide history d/t dementia.  Spoke with RN, nursing student and nurse tech, reports pt does not eat more than 25% of his meals. He takes a long time to swallow his foods. Noted pt is on aspiration precautions, may need to get Speech therapy to evaluate. Per MST screen, pt drinks nectar thick liquids and his appetite had decreased 2 days PTA.  Per RN, pt does have teeth so no chewing issues observed.   Per weight history, pt has lost 7 lb since 12/22/14 (4% wt loss x 3 months, insignificant for time frame).  Labs reviewed: Low K  Diet Order:  Diet regular Room service appropriate?: Yes; Fluid consistency:: Nectar Thick  Skin:  Reviewed, no issues  Last BM:  2/18  Height:   Ht Readings from Last 1 Encounters:  03/26/15 5\' 7"   (1.702 m)    Weight:   Wt Readings from Last 1 Encounters:  03/27/15 164 lb 3.9 oz (74.5 kg)    Ideal Body Weight:  67.3 kg  BMI:  Body mass index is 25.72 kg/(m^2).  Estimated Nutritional Needs:   Kcal:  1800-2000  Protein:  90-100g  Fluid:  2L/day  EDUCATION NEEDS:   No education needs identified at this time  Clayton Bibles, MS, RD, LDN Pager: [phone] After Hours Pager: [phone]

## 2015-03-28 NOTE — Evaluation (Signed)
Physical Therapy Evaluation Patient Details Name: Henry Alvarez MRN: [id number] DOB: May 31, 1924 Today's Date: 03/28/2015   History of Present Illness  82 yo male admitted with acute respiratoy failure. Hx of Parkinson's CAD PAfib, HTN, TIA. Pt was admitted from SNF  Clinical Impression  On eval, pt required Min assist for mobility-walked ~100 feet with RW. Dyspnea 2/4 and some wheezing noted. O2 sats 97% RA during ambulation. Recommend return to SNF    Follow Up Recommendations SNF    Equipment Recommendations  None recommended by PT    Recommendations for Other Services       Precautions / Restrictions Precautions Precautions: Fall Restrictions Weight Bearing Restrictions: No      Mobility  Bed Mobility Overal bed mobility: Needs Assistance Bed Mobility: Supine to Sit     Supine to sit: Min assist;HOB elevated     General bed mobility comments: Assist for trunk and scoot to EOB. Utilized bedpad to aid with scooting. Increased time. Moderate reliance on bedrail  Transfers Overall transfer level: Needs assistance Equipment used: Rolling walker (2 wheeled) Transfers: Sit to/from Stand Sit to Stand: Min assist         General transfer comment: Assist to rise, stabilize, control descent. VCs safety, hand placement  Ambulation/Gait Ambulation/Gait assistance: Min assist Ambulation Distance (Feet): 100 Feet Assistive device: Rolling walker (2 wheeled) Gait Pattern/deviations: Step-through pattern;Decreased step length - right;Decreased step length - left;Trunk flexed     General Gait Details: Intermittent short, choppy steps however pt able to correct without cueing once he gets a good rhythm. assist to safely maneuver with walker intermittently. slow gait speed.   Stairs            Wheelchair Mobility    Modified Rankin (Stroke Patients Only)       Balance Overall balance assessment: Needs assistance         Standing balance support:  Bilateral upper extremity supported;During functional activity Standing balance-Leahy Scale: Poor Standing balance comment: requiring use of walker                             Pertinent Vitals/Pain Pain Assessment: No/denies pain    Home Living Family/patient expects to be discharged to:: Skilled nursing facility                      Prior Function Level of Independence: Needs assistance   Gait / Transfers Assistance Needed: normally uses cane but having to use walker most recently           Hand Dominance        Extremity/Trunk Assessment   Upper Extremity Assessment: Generalized weakness           Lower Extremity Assessment: Generalized weakness      Cervical / Trunk Assessment: Kyphotic  Communication   Communication: No difficulties  Cognition Arousal/Alertness: Awake/alert Behavior During Therapy: WFL for tasks assessed/performed Overall Cognitive Status: Within Functional Limits for tasks assessed                      General Comments      Exercises        Assessment/Plan    PT Assessment Patient needs continued PT services  PT Diagnosis Difficulty walking;Generalized weakness   PT Problem List Decreased strength;Decreased activity tolerance;Decreased balance;Decreased mobility  PT Treatment Interventions Gait training;DME instruction;Functional mobility training;Therapeutic activities;Patient/family education;Balance training;Therapeutic exercise   PT Goals (Current goals  can be found in the Care Plan section) Acute Rehab PT Goals Patient Stated Goal: none stated PT Goal Formulation: With patient Time For Goal Achievement: 04/11/15 Potential to Achieve Goals: Fair    Frequency Min 3X/week   Barriers to discharge        Co-evaluation               End of Session Equipment Utilized During Treatment: Gait belt Activity Tolerance: Patient tolerated treatment well Patient left: in chair;with call  bell/phone within reach;with chair alarm set           Time: [id number] PT Time Calculation (min) (ACUTE ONLY): 33 min   Charges:   PT Evaluation $PT Eval Low Complexity: 1 Procedure PT Treatments $Gait Training: 8-22 mins   PT G Codes:        Weston Anna, MPT Pager: [phone]

## 2015-03-29 DIAGNOSIS — R531 Weakness: Secondary | ICD-10-CM | POA: Diagnosis not present

## 2015-03-29 DIAGNOSIS — I4891 Unspecified atrial fibrillation: Secondary | ICD-10-CM | POA: Diagnosis not present

## 2015-03-29 DIAGNOSIS — F0390 Unspecified dementia without behavioral disturbance: Secondary | ICD-10-CM | POA: Diagnosis not present

## 2015-03-29 DIAGNOSIS — D696 Thrombocytopenia, unspecified: Secondary | ICD-10-CM | POA: Diagnosis not present

## 2015-03-29 DIAGNOSIS — G4700 Insomnia, unspecified: Secondary | ICD-10-CM | POA: Diagnosis not present

## 2015-03-29 DIAGNOSIS — K5900 Constipation, unspecified: Secondary | ICD-10-CM | POA: Diagnosis not present

## 2015-03-29 DIAGNOSIS — J209 Acute bronchitis, unspecified: Secondary | ICD-10-CM | POA: Diagnosis not present

## 2015-03-29 DIAGNOSIS — I480 Paroxysmal atrial fibrillation: Secondary | ICD-10-CM | POA: Diagnosis not present

## 2015-03-29 DIAGNOSIS — M6281 Muscle weakness (generalized): Secondary | ICD-10-CM | POA: Diagnosis not present

## 2015-03-29 DIAGNOSIS — E784 Other hyperlipidemia: Secondary | ICD-10-CM | POA: Diagnosis not present

## 2015-03-29 DIAGNOSIS — I2510 Atherosclerotic heart disease of native coronary artery without angina pectoris: Secondary | ICD-10-CM | POA: Diagnosis not present

## 2015-03-29 DIAGNOSIS — Z5189 Encounter for other specified aftercare: Secondary | ICD-10-CM | POA: Diagnosis not present

## 2015-03-29 DIAGNOSIS — J9601 Acute respiratory failure with hypoxia: Secondary | ICD-10-CM | POA: Diagnosis not present

## 2015-03-29 DIAGNOSIS — E46 Unspecified protein-calorie malnutrition: Secondary | ICD-10-CM | POA: Diagnosis not present

## 2015-03-29 DIAGNOSIS — J111 Influenza due to unidentified influenza virus with other respiratory manifestations: Secondary | ICD-10-CM | POA: Diagnosis not present

## 2015-03-29 DIAGNOSIS — M7981 Nontraumatic hematoma of soft tissue: Secondary | ICD-10-CM | POA: Diagnosis not present

## 2015-03-29 DIAGNOSIS — M1712 Unilateral primary osteoarthritis, left knee: Secondary | ICD-10-CM | POA: Diagnosis not present

## 2015-03-29 DIAGNOSIS — R2681 Unsteadiness on feet: Secondary | ICD-10-CM | POA: Diagnosis not present

## 2015-03-29 DIAGNOSIS — R2689 Other abnormalities of gait and mobility: Secondary | ICD-10-CM | POA: Diagnosis not present

## 2015-03-29 DIAGNOSIS — I10 Essential (primary) hypertension: Secondary | ICD-10-CM | POA: Diagnosis not present

## 2015-03-29 DIAGNOSIS — D509 Iron deficiency anemia, unspecified: Secondary | ICD-10-CM | POA: Diagnosis not present

## 2015-03-29 DIAGNOSIS — R5381 Other malaise: Secondary | ICD-10-CM | POA: Diagnosis not present

## 2015-03-29 DIAGNOSIS — E785 Hyperlipidemia, unspecified: Secondary | ICD-10-CM | POA: Diagnosis not present

## 2015-03-29 DIAGNOSIS — G20 Parkinson's disease: Secondary | ICD-10-CM | POA: Diagnosis not present

## 2015-03-29 DIAGNOSIS — F0280 Dementia in other diseases classified elsewhere without behavioral disturbance: Secondary | ICD-10-CM | POA: Diagnosis not present

## 2015-03-29 DIAGNOSIS — F329 Major depressive disorder, single episode, unspecified: Secondary | ICD-10-CM

## 2015-03-29 DIAGNOSIS — G3183 Dementia with Lewy bodies: Secondary | ICD-10-CM | POA: Diagnosis not present

## 2015-03-29 DIAGNOSIS — B9689 Other specified bacterial agents as the cause of diseases classified elsewhere: Secondary | ICD-10-CM | POA: Diagnosis not present

## 2015-03-29 DIAGNOSIS — Z6826 Body mass index (BMI) 26.0-26.9, adult: Secondary | ICD-10-CM | POA: Diagnosis not present

## 2015-03-29 DIAGNOSIS — G9340 Encephalopathy, unspecified: Secondary | ICD-10-CM | POA: Diagnosis not present

## 2015-03-29 DIAGNOSIS — N400 Enlarged prostate without lower urinary tract symptoms: Secondary | ICD-10-CM | POA: Diagnosis not present

## 2015-03-29 DIAGNOSIS — J9600 Acute respiratory failure, unspecified whether with hypoxia or hypercapnia: Secondary | ICD-10-CM | POA: Diagnosis not present

## 2015-03-29 LAB — LEGIONELLA ANTIGEN, URINE

## 2015-03-29 LAB — GLUCOSE, CAPILLARY: Glucose-Capillary: 85 mg/dL (ref 65–99)

## 2015-03-29 MED ORDER — IPRATROPIUM-ALBUTEROL 0.5-2.5 (3) MG/3ML IN SOLN: 3.0000 mL | Freq: Four times a day (QID) | RESPIRATORY_TRACT | Status: DC | PRN

## 2015-03-29 MED ORDER — ENSURE ENLIVE PO LIQD: 237.0000 mL | Freq: Two times a day (BID) | ORAL | Status: DC

## 2015-03-29 MED ORDER — OSELTAMIVIR PHOSPHATE 30 MG PO CAPS: 30.0000 mg | ORAL_CAPSULE | Freq: Two times a day (BID) | ORAL | Status: DC

## 2015-03-29 MED ORDER — GUAIFENESIN ER 600 MG PO TB12: 1200.0000 mg | ORAL_TABLET | Freq: Two times a day (BID) | ORAL | Status: DC

## 2015-03-29 NOTE — Clinical Documentation Improvement (Signed)
Hospitalist  Can the diagnosis of CKD be further specified?   CKD Stage I - GFR greater than or equal to 90  CKD Stage II - GFR 60-89  CKD Stage III - GFR 30-59  Other condition  Unable to clinically determine   Supporting Information: : (risk factors, signs and symptoms, diagnostics, treatment) Patient with a history of CKD per 02/17 progress notes. Labs:   Bun     Creat     GFR: 02/19:   20       1.09       59 02/18:   18       1.14       55 02/17:   17       1.03      >60  Please exercise your independent, professional judgment when responding. A specific answer is not anticipated or expected.   Thank You, [place] [phone]

## 2015-03-29 NOTE — Care Management Important Message (Signed)
Important Message  Patient Details IM Letter given to Nora/Case Manager to present to Patient Name: Henry Alvarez MRN: [id number] Date of Birth: [birth year]   Medicare Important Message Given:  Yes    Camillo Flaming 03/29/2015, 2:36 [place] Message  Patient Details  Name: Henry Alvarez MRN: [id number] Date of Birth: 1924/09/02   Medicare Important Message Given:  Yes    Camillo Flaming 03/29/2015, 2:36 PM

## 2015-03-29 NOTE — Discharge Instructions (Signed)
Follow with Primary MD Sheela Stack, MD in 7 days   Get CBC, CMP, 2 view Chest X ray checked  by Primary MD next visit.    Activity: As tolerated with Full fall precautions use walker/cane & assistance as needed   Disposition SNF   Diet: Heart Healthy  , with feeding assistance and aspiration precautions.  For Heart failure patients - Check your Weight same time everyday, if you gain over 2 pounds, or you develop in leg swelling, experience more shortness of breath or chest pain, call your Primary MD immediately. Follow Cardiac Low Salt Diet and 1.5 lit/day fluid restriction.   On your next visit with your primary care physician please Get Medicines reviewed and adjusted.   Please request your Prim.MD to go over all Hospital Tests and Procedure/Radiological results at the follow up, please get all Hospital records sent to your Prim MD by signing hospital release before you go home.   If you experience worsening of your admission symptoms, develop shortness of breath, life threatening emergency, suicidal or homicidal thoughts you must seek medical attention immediately by calling 911 or calling your MD immediately  if symptoms less severe.  You Must read complete instructions/literature along with all the possible adverse reactions/side effects for all the Medicines you take and that have been prescribed to you. Take any new Medicines after you have completely understood and accpet all the possible adverse reactions/side effects.   Do not drive, operating heavy machinery, perform activities at heights, swimming or participation in water activities or provide baby sitting services if your were admitted for syncope or siezures until you have seen by Primary MD or a Neurologist and advised to do so again.  Do not drive when taking Pain medications.    Do not take more than prescribed Pain, Sleep and Anxiety Medications  Special Instructions: If you have smoked or chewed Tobacco  in  the last 2 yrs please stop smoking, stop any regular Alcohol  and or any Recreational drug use.  Wear Seat belts while driving.   Please note  You were cared for by a hospitalist during your hospital stay. If you have any questions about your discharge medications or the care you received while you were in the hospital after you are discharged, you can call the unit and asked to speak with the hospitalist on call if the hospitalist that took care of you is not available. Once you are discharged, your primary care physician will handle any further medical issues. Please note that NO REFILLS for any discharge medications will be authorized once you are discharged, as it is imperative that you return to your primary care physician (or establish a relationship with a primary care physician if you do not have one) for your aftercare needs so that they can reassess your need for medications and monitor your lab values.

## 2015-03-29 NOTE — Discharge Summary (Addendum)
Henry Alvarez, is a 82 y.o. male  DOB [birth year]  MRN [id number].  Admission date:  03/26/2015  Admitting Physician  [name], MD  Discharge Date:  03/29/2015   Primary MD  Sheela Stack, MD  Recommendations for primary care physician for things to follow:  - Please check CBC, BMP in 1 week. - Continue with flutter valve, and to be seen by respiratory therapist pulmonary toilet - Patient will need with pathology evaluation for dysphagia, within 1 week, discharge diet is heart healthy with nectar thick liquids.  Admission Diagnosis  Fever, unspecified fever cause [R50.9]   Discharge Diagnosis  Fever, unspecified fever cause [R50.9]   Principal Problem:   Acute respiratory failure with hypoxia (HCC) Active Problems:   Paroxysmal atrial fibrillation (HCC)   Long-term (current) use of anticoagulants   Parkinson's disease (HCC)   Enteritis due to Clostridium difficile   Depression   Thrombocytopenia (HCC)   Benign essential HTN   BPH (benign prostatic hyperplasia)   IDA (iron deficiency anemia)      Past Medical History  Diagnosis Date  . Arthritis   . Atrial fibrillation ([place])   . Parkinson disease ([place])   . Depression   . Anemia   . Iron deficiency anemia   . BPH (benign prostatic hyperplasia)   . Dementia   . Hypertension   . Thrombocytopenia ([employer])   . C. difficile colitis     hx of cdiff last fall 2016     Past Surgical History  Procedure Laterality Date  . Carpal tunnel release Left   . Total knee arthroplasty      right  . Cardiac catheterization  10/30/2005    Est. EF 65% -- Single-vessel obstructive atherosclerotic coronary artery disease -- Normal left ventricular function -- Peter M. Martinique, M.D.  . Coronary angioplasty with stent placement  11/02/2005    intracoronary stenting of the proximal left anterior descending artery --  Peter M. Martinique, M.D.  . Joint  replacement      right  . Kyphosis surgery  09/2010  . Cystoscopy  12/21/2010    Procedure: CYSTOSCOPY;  Surgeon: Molli Hazard, MD;  Location: [facility];  Service: Urology;  Laterality: N/A;  . Total knee arthroplasty  08/07/2011    Procedure: TOTAL KNEE ARTHROPLASTY;  Surgeon: Gearlean Alf, MD;  Location: [facility];  Service: Orthopedics;  Laterality: Left;  . Laryngoscopy N/A 08/26/2012    Procedure: DIRECT LARYNGOSCOPY WITH RADIESSE INJECTIONS ;  Surgeon: Melida Quitter, MD;  Location: [place];  Service: ENT;  Laterality: N/A;  direct laryngoscopy with radiesse injections  . Transurethral resection of prostate  12/2010       History of present illness and  Hospital Course:     Kindly see H&P for history of present illness and admission details, please review complete Labs, Consult reports and Test reports for all details in brief  HPI  from the history and physical done on the day of admission 03/26/2015  82 year old male with  past medical history of hypertension, Parkinson's disease, dementia, atrial fibrillation on AC with Eliquis. He presented from SNF to [facility] ED with fever and congestion. He was apparently on azithromycin which he was supposed to finished through 2/20. Due to dementia he is not a good historian and his family is not present at the bedside. No respiratory distress. No vomiting. No falls. No blood per rectum or blood in urine. No loss of consciousness.  In ED, BP was stable. T max was 100.2 F Blood work showed platelets of 98, normal creatinine. CXR showed no acute cardiopulmonary findings. CT head showed no acute intracranial findings. Admission requested for possible pneumonia.   Hospital Course  82 year old male with history of hypertension, Parkinson disease, dementia, A. fib on Eliquis, presents from SNF for congestion, cough, chest x-ray with no acute cardiopulmonary finding, low-grade temperature 100.2, workup significant for influenza, and bronchitis.  Acute  respiratory failure with hypoxia (HCC) - No acute cardiopulmonary finding on CXR , most likely related to acute bronchitis and influenza. supposed to be on azithromycin through 2/20 she has continued until day of discharge, initially on IV Rocephin, has been stopped as no evidence of pneumonia especially in the setting of history of C. difficile - legionella negative, strep pneumonia negative - Patient with significant congestion,continue with flutter valves, Mucinex and pulmonary toilet  Influenza - on Tamiflu, to continue through 2/23  Acute bronchitis - Treated with azithromycin  Paroxysmal atrial fibrillation (HCC) / Long-term (current) use of anticoagulants - CHADS vasc score 2-3, On AC with Apixaban - Bradycardic in 55 HR range and not on BB at hoem   Parkinson's disease ([place]) - Continue sinemet   Enteritis due to Clostridium difficile - Continue florastor   Depression - Continue paxil   Thrombocytopenia (HCC) - Likely from eliquis - chronic thrombocytopenia and in past moth or so his platelets in 110's   Benign essential HTN - Continue Norvasc   BPH (benign prostatic hyperplasia) - Continue Flmax   IDA (iron deficiency anemia) - Continue ferrous sulfate supplementation     Discharge Condition:  Stable   Follow UP  Follow-up Information    Follow up with Sheela Stack, MD.   Specialty:  Endocrinology   Contact information:   [address] [place] [address] [phone]         Discharge Instructions  and  Discharge Medications     Discharge Instructions    Discharge instructions    Complete by:  As directed   Follow with Primary MD Sheela Stack, MD in 7 days   Get CBC, CMP, 2 view Chest X ray checked  by Primary MD next visit.    Activity: As tolerated with Full fall precautions use walker/cane & assistance as needed   Disposition SNF   Diet: Heart Healthy  , with feeding assistance and aspiration  precautions.  For Heart failure patients - Check your Weight same time everyday, if you gain over 2 pounds, or you develop in leg swelling, experience more shortness of breath or chest pain, call your Primary MD immediately. Follow Cardiac Low Salt Diet and 1.5 lit/day fluid restriction.   On your next visit with your primary care physician please Get Medicines reviewed and adjusted.   Please request your Prim.MD to go over all Hospital Tests and Procedure/Radiological results at the follow up, please get all Hospital records sent to your Prim MD by signing hospital release before you go home.   If you experience worsening of your admission symptoms,  develop shortness of breath, life threatening emergency, suicidal or homicidal thoughts you must seek medical attention immediately by calling 911 or calling your MD immediately  if symptoms less severe.  You Must read complete instructions/literature along with all the possible adverse reactions/side effects for all the Medicines you take and that have been prescribed to you. Take any new Medicines after you have completely understood and accpet all the possible adverse reactions/side effects.   Do not drive, operating heavy machinery, perform activities at heights, swimming or participation in water activities or provide baby sitting services if your were admitted for syncope or siezures until you have seen by Primary MD or a Neurologist and advised to do so again.  Do not drive when taking Pain medications.    Do not take more than prescribed Pain, Sleep and Anxiety Medications  Special Instructions: If you have smoked or chewed Tobacco  in the last 2 yrs please stop smoking, stop any regular Alcohol  and or any Recreational drug use.  Wear Seat belts while driving.   Please note  You were cared for by a hospitalist during your hospital stay. If you have any questions about your discharge medications or the care you received while you  were in the hospital after you are discharged, you can call the unit and asked to speak with the hospitalist on call if the hospitalist that took care of you is not available. Once you are discharged, your primary care physician will handle any further medical issues. Please note that NO REFILLS for any discharge medications will be authorized once you are discharged, as it is imperative that you return to your primary care physician (or establish a relationship with a primary care physician if you do not have one) for your aftercare needs so that they can reassess your need for medications and monitor your lab values.     Increase activity slowly    Complete by:  As directed             Medication List    STOP taking these medications        azithromycin 250 MG tablet  Commonly known as:  ZITHROMAX     traMADol-acetaminophen 37.5-325 MG tablet  Commonly known as:  ULTRACET      TAKE these medications        acetaminophen 325 MG tablet  Commonly known as:  TYLENOL  Take 2 tablets (650 mg total) by mouth every 4 (four) hours as needed for mild pain, fever or headache.     amLODipine 2.5 MG tablet  Commonly known as:  NORVASC  Take 2.5 mg by mouth daily.     carbidopa-levodopa 25-100 MG tablet  Commonly known as:  SINEMET IR  Take 1.5 tablets by mouth 4 (four) times daily. Take one and a half tablets (1-1/2)     donepezil 10 MG tablet  Commonly known as:  ARICEPT  Take 1 tablet (10 mg total) by mouth at bedtime.     ELIQUIS 2.5 MG Tabs tablet  Generic drug:  apixaban  Take 2.5 mg by mouth 2 (two) times daily.     feeding supplement (ENSURE ENLIVE) Liqd  Take 237 mLs by mouth 2 (two) times daily between meals. Or facility alternative formulary     FLORASTOR PO  Take 1 capsule by mouth daily.     guaiFENesin 600 MG 12 hr tablet  Commonly known as:  MUCINEX  Take 2 tablets (1,200 mg total) by mouth 2 (two)  times daily. Please take for 1 week     hydrocortisone 2.5 %  cream  Apply 1 application topically daily as needed (irritation/dryness). To face and nose     ipratropium-albuterol 0.5-2.5 (3) MG/3ML Soln  Commonly known as:  DUONEB  Take 3 mLs by nebulization every 6 (six) hours as needed.     iron polysaccharides 150 MG capsule  Commonly known as:  NIFEREX  Take 1 capsule (150 mg total) by mouth daily.     ketoconazole 2 % cream  Commonly known as:  NIZORAL  Apply 1 application topically daily as needed for irritation. Face and nose     latanoprost 0.005 % ophthalmic solution  Commonly known as:  XALATAN  Place 1 drop into both eyes at bedtime.     Melatonin 5 MG Caps  Take 5 mg by mouth at bedtime.     multivitamin tablet  Take 1 tablet by mouth daily.     oseltamivir 30 MG capsule  Commonly known as:  TAMIFLU  Take 1 capsule (30 mg total) by mouth 2 (two) times daily. Please continue through 2/23     PARoxetine 10 MG tablet  Commonly known as:  PAXIL  Take 10 mg by mouth daily.     polyethylene glycol packet  Commonly known as:  MIRALAX / GLYCOLAX  Take 17 g by mouth daily.     tamsulosin 0.4 MG Caps capsule  Commonly known as:  FLOMAX  Take 0.4 mg by mouth daily.          Diet and Activity recommendation: See Discharge Instructions above   Consults obtained - none   Major procedures and Radiology Reports - PLEASE review detailed and final reports for all details, in brief -     Dg Chest 2 View  03/26/2015  CLINICAL DATA:  Fever. EXAM: CHEST  2 VIEW COMPARISON:  February 25, 2015. FINDINGS: Stable cardiomediastinal silhouette. No pneumothorax or pleural effusion is noted. No acute pulmonary disease is noted. Status post kyphoplasty of lower thoracic vertebral body. IMPRESSION: No active cardiopulmonary disease. Electronically Signed   By: Marijo Conception, M.D.   On: 03/26/2015 08:50   Ct Head Wo Contrast  03/26/2015  CLINICAL DATA:  Altered loss of consciousness.  Eliquis use EXAM: CT HEAD WITHOUT CONTRAST  TECHNIQUE: Contiguous axial images were obtained from the base of the skull through the vertex without intravenous contrast. COMPARISON:  02/24/2015 FINDINGS: Skull and Sinuses:Negative for fracture or destructive process. The visualized mastoids, middle ears, and imaged paranasal sinuses are clear. Visualized orbits: Negative. Brain: No evidence of acute infarction, hemorrhage, hydrocephalus, or mass lesion/mass effect. Generalized cerebral volume loss, mild for age. Chronic small-vessel disease with confluent ischemic gliosis in the deep cerebral white matter. Calcified intracranial atherosclerosis, extensive in the vertebral arteries. IMPRESSION: 1. No acute finding. 2. Moderate chronic small vessel disease. Electronically Signed   By: Monte Fantasia M.D.   On: 03/26/2015 10:32    Micro Results     Recent Results (from the past 240 hour(s))  Urine culture     Status: None   Collection Time: 03/26/15 10:56 PM  Result Value Ref Range Status   Specimen Description URINE, CLEAN CATCH  Final   Special Requests NONE  Final   Culture   Final    MULTIPLE SPECIES PRESENT, SUGGEST RECOLLECTION Performed at [facility]    Report Status 03/28/2015 FINAL  Final       Today   Subjective:  Henry Alvarez  today, No headache, No chest pain, cough significantly subsided  Objective:   Blood pressure 177/73, pulse 52, temperature 98.4 F (36.9 C), temperature source Axillary, resp. rate 20, height 5\' 7"  (1.702 m), weight 74.5 kg (164 lb 3.9 oz), SpO2 99 %.   Intake/Output Summary (Last 24 hours) at 03/29/15 1218 Last data filed at 03/28/15 1843  Gross per 24 hour  Intake   1180 ml  Output      0 ml  Net   1180 ml    Exam  Awake Alert,frail  [place].AT,PERRAL Supple Neck,No JVD,  Symmetrical Chest wall movement, Good air movement bilaterally,upper airway congestion  No Gallops,Rubs or new Murmurs, No Parasternal Heave +ve B.Sounds, Abd Soft, No tenderness, No organomegaly  appriciated, No rebound - guarding or rigidity. No Cyanosis, Clubbing or edema, No new Rash or bruise   Data Review   CBC w Diff: Lab Results  Component Value Date   WBC 6.2 03/28/2015   WBC 8.9 03/02/2015   HGB 10.8* 03/28/2015   HCT 33.6* 03/28/2015   PLT 91* 03/28/2015   LYMPHOPCT 5 03/26/2015   MONOPCT 10 03/26/2015   EOSPCT 0 03/26/2015   BASOPCT 0 03/26/2015    CMP: Lab Results  Component Value Date   NA 136 03/28/2015   NA 137 03/02/2015   K 3.4* 03/28/2015   CL 101 03/28/2015   CO2 27 03/28/2015   BUN 20 03/28/2015   BUN 26* 03/02/2015   CREATININE 1.09 03/28/2015   CREATININE 1.0 03/02/2015   GLU 89 03/02/2015   PROT 7.3 12/06/2014   ALBUMIN 4.2 12/06/2014   BILITOT 0.6 12/06/2014   ALKPHOS 56 03/02/2015   AST 17 03/02/2015   ALT 4* 03/02/2015  .   Total Time in preparing paper work, data evaluation and todays exam - 35 minutes  Suad Autrey M.D on 03/29/2015 at 12:18 PM  [facility]   Office  [phone]

## 2015-03-29 NOTE — Clinical Social Work Placement (Signed)
   CLINICAL SOCIAL WORK PLACEMENT  NOTE  Date:  03/29/2015  Patient Details  Name: Henry Alvarez MRN: [id number] Date of Birth: 13-Nov-1924  Clinical Social Work is seeking post-discharge placement for this patient at the [place] level of care (*CSW will initial, date and re-position this form in  chart as items are completed):  Yes   Patient/family provided with [place] Work Department's list of facilities offering this level of care within the geographic area requested by the patient (or if unable, by the patient's family).  Yes   Patient/family informed of their freedom to choose among providers that offer the needed level of care, that participate in Medicare, Medicaid or managed care program needed by the patient, have an available bed and are willing to accept the patient.  Yes   Patient/family informed of [facility]'s ownership interest in [facility] and [facility], as well as of the fact that they are under no obligation to receive care at these facilities.  PASRR submitted to EDS on       PASRR number received on       Existing PASRR number confirmed on 03/28/15     FL2 transmitted to all facilities in geographic area requested by pt/family on 03/28/15     FL2 transmitted to all facilities within larger geographic area on       Patient informed that his/her managed care company has contracts with or will negotiate with certain facilities, including the following:        Yes   Patient/family informed of bed offers received.  Patient chooses bed at [facility]     Physician recommends and patient chooses bed at      Patient to be transferred to [facility] on 03/29/15.  Patient to be transferred to facility by ambulance [facility])     Patient family notified on 03/29/15 of transfer.  Name of family member notified:  RN notified pt at bedside, [place] notified pt son, [name] via [place] Please prepare  priority discharge summary, including medications, Please prepare prescriptions, Please sign FL2     Additional Comment:    _______________________________________________ Ladell Pier, LCSW 03/29/2015, 2:59 PM

## 2015-03-29 NOTE — Progress Notes (Signed)
CSW continuing to follow.   CSW provided bed offers this morning and pt and pt son choose [facility].   Per MD, pt medically stable for discharge today.  CSW facilitated pt discharge needs including contacting facility, faxing pt discharge information via epic hub, notifying pt at bedside and pt son, [name] via telephone, providing RN phone number to call report, and arranging ambulance transport for pt to [facility].   No further social work needs identified at this time.  CSW signing off.   Alison Murray, MSW, [place] Work [phone]

## 2015-03-30 ENCOUNTER — Non-Acute Institutional Stay (SKILLED_NURSING_FACILITY): Payer: Medicare Other | Admitting: Adult Health

## 2015-03-30 ENCOUNTER — Encounter: Payer: Self-pay | Admitting: Adult Health

## 2015-03-30 DIAGNOSIS — F329 Major depressive disorder, single episode, unspecified: Secondary | ICD-10-CM | POA: Diagnosis not present

## 2015-03-30 DIAGNOSIS — G4700 Insomnia, unspecified: Secondary | ICD-10-CM

## 2015-03-30 DIAGNOSIS — F0280 Dementia in other diseases classified elsewhere without behavioral disturbance: Secondary | ICD-10-CM | POA: Diagnosis not present

## 2015-03-30 DIAGNOSIS — G20 Parkinson's disease: Secondary | ICD-10-CM | POA: Diagnosis not present

## 2015-03-30 DIAGNOSIS — J111 Influenza due to unidentified influenza virus with other respiratory manifestations: Secondary | ICD-10-CM

## 2015-03-30 DIAGNOSIS — I10 Essential (primary) hypertension: Secondary | ICD-10-CM | POA: Diagnosis not present

## 2015-03-30 DIAGNOSIS — I480 Paroxysmal atrial fibrillation: Secondary | ICD-10-CM

## 2015-03-30 DIAGNOSIS — J209 Acute bronchitis, unspecified: Secondary | ICD-10-CM

## 2015-03-30 DIAGNOSIS — K5900 Constipation, unspecified: Secondary | ICD-10-CM

## 2015-03-30 DIAGNOSIS — R531 Weakness: Secondary | ICD-10-CM | POA: Diagnosis not present

## 2015-03-30 DIAGNOSIS — F32A Depression, unspecified: Secondary | ICD-10-CM

## 2015-03-30 DIAGNOSIS — K5909 Other constipation: Secondary | ICD-10-CM

## 2015-03-30 DIAGNOSIS — N400 Enlarged prostate without lower urinary tract symptoms: Secondary | ICD-10-CM | POA: Diagnosis not present

## 2015-03-30 NOTE — Progress Notes (Deleted)
Patient ID: Henry Alvarez, male   DOB: 1924-07-17, 82 y.o.   MRN: [id number]

## 2015-03-30 NOTE — Progress Notes (Signed)
Patient ID: Henry Alvarez, male   DOB: [birth year], 82 y.o.   MRN: [id number]    DATE:  03/30/2015   MRN:  [id number]  BIRTHDAY: 03-Oct-1924  Facility:  [facility] Home Location:  [place] Room Number: 407-P  LEVEL OF CARE:  SNF (31)  Contact Information    Name Relation Home Work [place] Son   [phone]   Tlusty,Laura Daughter [phone]         Code Status History    Date Active Date Inactive Code Status Order ID Comments User Context   03/26/2015  4:41 PM 03/29/2015  7:09 PM Full Code [id number]  [name], MD Inpatient   02/21/2015  8:53 PM 02/28/2015  5:21 PM Full Code [id number]  [name], MD Inpatient   10/30/2014  5:28 AM 11/03/2014  5:22 PM Full Code [id number]  [name], DO Inpatient   09/22/2013 12:37 AM 09/25/2013  5:32 PM Full Code [id number]  [name], MD Inpatient   08/07/2011 11:18 AM 08/10/2011  2:27 PM Full Code [id number]  Denton Meek, RN Inpatient       Chief Complaint  Patient presents with  . Hospitalization Follow-up    HISTORY OF PRESENT ILLNESS:  This is an 82 year old male who has been readmitted to [facility] on 03/29/15 from [facility]. He has PMH of  hypertension, Parkinson's disease, dementia and atrial fibrillation on anticoagulation with Eliquis. He presented to ED with fever and cogestion.  CXR showed no acute cardiopulmonary findings. CT head showed no acute intracranial findings. Workup significant for influenza, and bronchitis. He was treated with Azithromycin and Tamiflu.  He has been admitted for a short-term rehabilitation.  PAST MEDICAL HISTORY:  Past Medical History  Diagnosis Date  . Arthritis   . Atrial fibrillation ([place])   . Parkinson disease ([place])   . Depression   . Anemia   . Iron deficiency anemia   . BPH (benign prostatic hyperplasia)   . Dementia   . Hypertension   . Thrombocytopenia ([place])   . C. difficile colitis     hx of cdiff last fall 2016       CURRENT MEDICATIONS: Reviewed  Patient's Medications  New Prescriptions   No medications on file  Previous Medications   ACETAMINOPHEN (TYLENOL) 325 MG TABLET    Take 2 tablets (650 mg total) by mouth every 4 (four) hours as needed for mild pain, fever or headache.   AMLODIPINE (NORVASC) 2.5 MG TABLET    Take 2.5 mg by mouth daily.   CARBIDOPA-LEVODOPA (SINEMET IR) 25-100 MG TABLET    Take 1.5 tablets by mouth 4 (four) times daily. Take one and a half tablets (1-1/2)   DONEPEZIL (ARICEPT) 10 MG TABLET    Take 1 tablet (10 mg total) by mouth at bedtime.   ELIQUIS 2.5 MG TABS TABLET    Take 2.5 mg by mouth 2 (two) times daily.   GUAIFENESIN (MUCINEX) 600 MG 12 HR TABLET    Take 2 tablets (1,200 mg total) by mouth 2 (two) times daily. Please take for 1 week   HYDROCORTISONE 2.5 % CREAM    Apply 1 application topically daily as needed (irritation/dryness). To face and nose   IPRATROPIUM-ALBUTEROL (DUONEB) 0.5-2.5 (3) MG/3ML SOLN    Take 3 mLs by nebulization every 6 (six) hours as needed.   IRON POLYSACCHARIDES (NIFEREX) 150 MG CAPSULE    Take 150 mg by mouth daily.  KETOCONAZOLE (NIZORAL) 2 % CREAM    Apply 1 application topically daily as needed for irritation. Face and nose   LATANOPROST (XALATAN) 0.005 % OPHTHALMIC SOLUTION    Place 1 drop into both eyes at bedtime.    MELATONIN 5 MG CAPS    Take 5 mg by mouth at bedtime.   MULTIPLE VITAMIN (MULTIVITAMIN) TABLET    Take 1 tablet by mouth daily.   OSELTAMIVIR (TAMIFLU) 30 MG CAPSULE    Take 1 capsule (30 mg total) by mouth 2 (two) times daily. Please continue through 2/23   PAROXETINE (PAXIL) 10 MG TABLET    Take 10 mg by mouth daily.   POLYETHYLENE GLYCOL (MIRALAX / GLYCOLAX) PACKET    Take 17 g by mouth daily.   SACCHAROMYCES BOULARDII (FLORASTOR PO)    Take 1 capsule by mouth daily.    TAMSULOSIN (FLOMAX) 0.4 MG CAPS CAPSULE    Take 0.4 mg by mouth daily.    UNABLE TO FIND    Take 120 mLs by mouth 2 (two) times daily. Med Name:  MedPass  Modified Medications   No medications on file  Discontinued Medications   FEEDING SUPPLEMENT, ENSURE ENLIVE, (ENSURE ENLIVE) LIQD    Take 237 mLs by mouth 2 (two) times daily between meals. Or facility alternative formulary   IRON POLYSACCHARIDES (NIFEREX) 150 MG CAPSULE    Take 1 capsule (150 mg total) by mouth daily.   POLYETHYLENE GLYCOL (MIRALAX / GLYCOLAX) PACKET    Take 17 g by mouth daily.     Allergies  Allergen Reactions  . Ciprocin-Fluocin-Procin [Fluocinolone]     Past reactions to "quinolones"-just not to have it per Family member.      REVIEW OF SYSTEMS:  GENERAL: no change in appetite, no fatigue, no weight changes, no fever, chills or weakness EYES: Denies change in vision, dry eyes, eye pain, itching or discharge EARS: Denies change in hearing, ringing in ears, or earache NOSE: Denies nasal congestion or epistaxis MOUTH and THROAT: Denies oral discomfort, gingival pain or bleeding, pain from teeth or hoarseness   RESPIRATORY: no cough, SOB, DOE, wheezing, hemoptysis CARDIAC: no chest pain, edema or palpitations GI: no abdominal pain, diarrhea, constipation, heart burn, nausea or vomiting GU: Denies dysuria, frequency, hematuria, incontinence, or discharge PSYCHIATRIC: Denies feeling of depression or anxiety. No report of hallucinations, insomnia, paranoia, or agitation   PHYSICAL EXAMINATION  GENERAL APPEARANCE: Well nourished. In no acute distress. Normal body habitus SKIN:  Skin is warm and dry.  HEAD: Normal in size and contour. No evidence of trauma EYES: Lids open and close normally. No blepharitis, entropion or ectropion. PERRL. Conjunctivae are clear and sclerae are white. Lenses are without opacity EARS: Pinnae are normal. Patient hears normal voice tunes of the examiner MOUTH and THROAT: Lips are without lesions. Oral mucosa is moist and without lesions. Tongue is normal in shape, size, and color and without lesions NECK: supple, trachea  midline, no neck masses, no thyroid tenderness, no thyromegaly LYMPHATICS: no LAN in the neck, no supraclavicular LAN RESPIRATORY: breathing is even & unlabored, BS CTAB CARDIAC: RRR, no murmur,no extra heart sounds, no edema GI: abdomen soft, normal BS, no masses, no tenderness, no hepatomegaly, no splenomegaly EXTREMITIES:  Notable to move 4 extremities PSYCHIATRIC: Alert and oriented X 3. Affect and behavior are appropriate  LABS/RADIOLOGY: Labs reviewed: Basic Metabolic Panel:  Recent Labs  10/30/14 0111  03/26/15 0801 03/27/15 0358 03/28/15 0421  NA 136  < > 130* 134* 136  K  3.8  < > 4.1 3.6 3.4*  CL 104  < > 96* 100* 101  CO2 23  < > 22 24 27   GLUCOSE 103*  < > 99 105* 94  BUN 21*  < > 17 18 20   CREATININE 1.04  < > 1.03 1.14 1.09  CALCIUM 9.0  < > 8.9 8.2* 8.1*  MG 1.8  --   --   --   --   < > = values in this interval not displayed. Liver Function Tests:  Recent Labs  10/31/14 0500 11/02/14 0450 11/16/14 12/06/14 1940 03/02/15  AST 26 25 25 26 17   ALT <5* <5* 12 <5* 4*  ALKPHOS 52 43 50 53 56  BILITOT 0.9 0.6  --  0.6  --   PROT 5.9* 5.5*  --  7.3  --   ALBUMIN 3.0* 2.8*  --  4.2  --     Recent Labs  10/30/14 0111 12/06/14 1940  LIPASE 22 43    Recent Labs  02/25/15 1442  AMMONIA <9*   CBC:  Recent Labs  02/21/15 1410  03/02/15 03/26/15 0801 03/27/15 0358 03/28/15 0421  WBC 11.0*  < > 8.9 9.5 10.2 6.2  NEUTROABS 9.5*  --  7 8.1*  --   --   HGB 11.4*  < > 10.2* 12.1* 11.5* 10.8*  HCT 35.8*  < > 32* 37.5* 35.8* 33.6*  MCV 94.2  < >  --  94.5 95.7 95.2  PLT 118*  < > 120* 98* 103* 91*  < > = values in this interval not displayed.  CBG:  Recent Labs  03/27/15 0738 03/28/15 0801 03/29/15 0734  GLUCAP 86 97 85      Dg Chest 2 View  03/26/2015  CLINICAL DATA:  Fever. EXAM: CHEST  2 VIEW COMPARISON:  February 25, 2015. FINDINGS: Stable cardiomediastinal silhouette. No pneumothorax or pleural effusion is noted. No acute pulmonary  disease is noted. Status post kyphoplasty of lower thoracic vertebral body. IMPRESSION: No active cardiopulmonary disease. Electronically Signed   By: Marijo Conception, M.D.   On: 03/26/2015 08:50   Ct Head Wo Contrast  03/26/2015  CLINICAL DATA:  Altered loss of consciousness.  Eliquis use EXAM: CT HEAD WITHOUT CONTRAST TECHNIQUE: Contiguous axial images were obtained from the base of the skull through the vertex without intravenous contrast. COMPARISON:  02/24/2015 FINDINGS: Skull and Sinuses:Negative for fracture or destructive process. The visualized mastoids, middle ears, and imaged paranasal sinuses are clear. Visualized orbits: Negative. Brain: No evidence of acute infarction, hemorrhage, hydrocephalus, or mass lesion/mass effect. Generalized cerebral volume loss, mild for age. Chronic small-vessel disease with confluent ischemic gliosis in the deep cerebral white matter. Calcified intracranial atherosclerosis, extensive in the vertebral arteries. IMPRESSION: 1. No acute finding. 2. Moderate chronic small vessel disease. Electronically Signed   By: Monte Fantasia M.D.   On: 03/26/2015 10:32    ASSESSMENT/PLAN:  Generalized weakness - for rehabilitation  Influenza - continue Tamiflu 30 mg 1 capsule by mouth twice a day till 2/23, acetaminophen 325 mg take 2 tabs = 650 mg by mouth every 4 hours when necessary for pain and fever  Bronchitis - treated with azithromycin; continue Mucinex 600 mg 1 tab by mouth twice a day 1 week and DuoNeb 0.5 mg-3 mg/3 mL 1 Neb every 6 hours when necessary  Hypertension - continue amlodipine 2.5 mg 1 tab by mouth daily; check CMP  Atrial fibrillation - rate controlled; continue Eliquis 2.5 mg 1 tab by  mouth twice a day  Parkinson's disease - continue Sinemet ER 25-100 mg 1 1/2 tab = 37.5/150 mg by mouth 4 times a day  Dementia - continue Aricept 10 mg 1 tab daily  Chronic depression - mood is stable; continue Paxil 10 mg 1 tab by mouth daily  BPH -  continue Flomax 0.4 mg 1 capsule by mouth daily  Chronic constipation - continue MiraLAX 17 g daily  Insomnia - continue melatonin 5 mg 1 tab by mouth daily at bedtime  Anemia, iron deficiency - hgb 10.8; continue Ferrex 150 mg daily; check CBC     Goals of care:  Short-term rehabilitation    [facility], NP [facility] [phone]

## 2015-04-01 ENCOUNTER — Encounter: Payer: Self-pay | Admitting: Internal Medicine

## 2015-04-01 ENCOUNTER — Non-Acute Institutional Stay (SKILLED_NURSING_FACILITY): Payer: Medicare Other | Admitting: Internal Medicine

## 2015-04-01 DIAGNOSIS — I480 Paroxysmal atrial fibrillation: Secondary | ICD-10-CM

## 2015-04-01 DIAGNOSIS — N400 Enlarged prostate without lower urinary tract symptoms: Secondary | ICD-10-CM

## 2015-04-01 DIAGNOSIS — E46 Unspecified protein-calorie malnutrition: Secondary | ICD-10-CM

## 2015-04-01 DIAGNOSIS — I10 Essential (primary) hypertension: Secondary | ICD-10-CM

## 2015-04-01 DIAGNOSIS — F329 Major depressive disorder, single episode, unspecified: Secondary | ICD-10-CM

## 2015-04-01 DIAGNOSIS — R5381 Other malaise: Secondary | ICD-10-CM | POA: Diagnosis not present

## 2015-04-01 DIAGNOSIS — J111 Influenza due to unidentified influenza virus with other respiratory manifestations: Secondary | ICD-10-CM | POA: Diagnosis not present

## 2015-04-01 DIAGNOSIS — F0280 Dementia in other diseases classified elsewhere without behavioral disturbance: Secondary | ICD-10-CM | POA: Diagnosis not present

## 2015-04-01 DIAGNOSIS — G20 Parkinson's disease: Secondary | ICD-10-CM

## 2015-04-01 DIAGNOSIS — D509 Iron deficiency anemia, unspecified: Secondary | ICD-10-CM | POA: Diagnosis not present

## 2015-04-01 DIAGNOSIS — J209 Acute bronchitis, unspecified: Secondary | ICD-10-CM | POA: Diagnosis not present

## 2015-04-01 DIAGNOSIS — K5909 Other constipation: Secondary | ICD-10-CM

## 2015-04-01 DIAGNOSIS — K5900 Constipation, unspecified: Secondary | ICD-10-CM | POA: Diagnosis not present

## 2015-04-01 DIAGNOSIS — F32A Depression, unspecified: Secondary | ICD-10-CM

## 2015-04-01 NOTE — Progress Notes (Signed)
Patient ID: Henry Alvarez, male   DOB: 1924-08-06, 82 y.o.   MRN: [id number]    LOCATION: [place]  PCP: Sheela Stack, MD   Code Status: Full Code  Goals of care: Advanced Directive information Advanced Directives 03/26/2015  Does patient have an advance directive? No  Type of Advance Directive -  Does patient want to make changes to advanced directive? -  Copy of advanced directive(s) in chart? -  Would patient like information on creating an advanced directive? No - patient declined information     Extended Emergency Contact Information Primary Emergency Contact: [facility] Address: [address], [place] [address] Johnnette Litter of [place] Phone: [phone] Relation: Son Secondary Emergency Contact: Higinio Plan , [place] of [place] Phone: [phone] Relation: Daughter   Allergies  Allergen Reactions  . Ciprocin-Fluocin-Procin [Fluocinolone]     Past reactions to "quinolones"-just not to have it per Family member.     Chief Complaint  Patient presents with  . New Admit To SNF    New Admission     HPI:  Patient is a 82 y.o. male seen today for short term rehabilitation post hospital admission from 03/26/15-03/29/15 with acute respiratory failure with hypoxia from influenza and acute bronchitis. He was started on tamiflu and antibiotics. He has PMH of afib, parkinson's disease, HTN, BPH, depression among others. He is seen in his room today. He has cough but mentions that his breathing has improved. He feels weak and tired.   Review of Systems:  Constitutional: Negative for fever, chills, diaphoresis.  HENT: Negative for headache, congestion. Has runny nose. Denies sore trhoat Eyes: Negative for blurred vision, double vision and discharge.  Respiratory: Negative for shortness of breath and wheezing.  positive for cough Cardiovascular: Negative for chest pain, palpitations.    Gastrointestinal: Negative for heartburn, nausea, vomiting, abdominal pain. Had bowel movement yesterday. Genitourinary: Negative for dysuria Musculoskeletal: Negative for fall  Skin: Negative for itching, rash.  Neurological: Negative for dizziness. Psychiatric/Behavioral: Negative for depression   Past Medical History  Diagnosis Date  . Arthritis   . Atrial fibrillation ([place])   . Parkinson disease ([place])   . Depression   . Anemia   . Iron deficiency anemia   . BPH (benign prostatic hyperplasia)   . Dementia   . Hypertension   . Thrombocytopenia ([place])   . C. difficile colitis     hx of cdiff last fall 2016    Past Surgical History  Procedure Laterality Date  . Carpal tunnel release Left   . Total knee arthroplasty      right  . Cardiac catheterization  10/30/2005    Est. EF 65% -- Single-vessel obstructive atherosclerotic coronary artery disease -- Normal left ventricular function -- Peter M. Martinique, M.D.  . Coronary angioplasty with stent placement  11/02/2005    intracoronary stenting of the proximal left anterior descending artery --  Peter M. Martinique, M.D.  . Joint replacement      right  . Kyphosis surgery  09/2010  . Cystoscopy  12/21/2010    Procedure: CYSTOSCOPY;  Surgeon: Molli Hazard, MD;  Location: [facility];  Service: Urology;  Laterality: N/A;  . Total knee arthroplasty  08/07/2011    Procedure: TOTAL KNEE ARTHROPLASTY;  Surgeon: Gearlean Alf, MD;  Location: [facility];  Service: Orthopedics;  Laterality: Left;  . Laryngoscopy N/A 08/26/2012  Procedure: DIRECT LARYNGOSCOPY WITH RADIESSE INJECTIONS ;  Surgeon: Melida Quitter, MD;  Location: [place];  Service: ENT;  Laterality: N/A;  direct laryngoscopy with radiesse injections  . Transurethral resection of prostate  12/2010   Social History:   reports that he has never smoked. He has never used smokeless tobacco. He reports that he does not drink alcohol or use illicit drugs.  Family History  Problem  Relation Age of Onset  . Heart attack Father   . Angina Father   . Heart failure Mother   . Hypertension Sister   . Heart disease      grandmother  . Heart failure Mother   . Colon cancer Neg Hx   . Esophageal cancer Neg Hx   . Kidney disease Neg Hx   . Liver disease Neg Hx     Medications:   Medication List       This list is accurate as of: 04/01/15  8:34 AM.  Always use your most recent med list.               acetaminophen 325 MG tablet  Commonly known as:  TYLENOL  Take 2 tablets (650 mg total) by mouth every 4 (four) hours as needed for mild pain, fever or headache.     amLODipine 2.5 MG tablet  Commonly known as:  NORVASC  Take 2.5 mg by mouth daily.     carbidopa-levodopa 25-100 MG tablet  Commonly known as:  SINEMET IR  Take 1.5 tablets by mouth 4 (four) times daily. Take one and a half tablets (1-1/2)     donepezil 10 MG tablet  Commonly known as:  ARICEPT  Take 1 tablet (10 mg total) by mouth at bedtime.     ELIQUIS 2.5 MG Tabs tablet  Generic drug:  apixaban  Take 2.5 mg by mouth 2 (two) times daily.     FLORASTOR PO  Take 1 capsule by mouth daily.     guaiFENesin 600 MG 12 hr tablet  Commonly known as:  MUCINEX  Take 600 mg by mouth 2 (two) times daily. Stop date 04/05/15     hydrocortisone 2.5 % cream  Apply 1 application topically daily as needed (irritation/dryness). To face and nose     ipratropium-albuterol 0.5-2.5 (3) MG/3ML Soln  Commonly known as:  DUONEB  Take 3 mLs by nebulization every 6 (six) hours as needed.     iron polysaccharides 150 MG capsule  Commonly known as:  NIFEREX  Take 150 mg by mouth daily.     ketoconazole 2 % cream  Commonly known as:  NIZORAL  Apply 1 application topically daily as needed for irritation. Face and nose     latanoprost 0.005 % ophthalmic solution  Commonly known as:  XALATAN  Place 1 drop into both eyes at bedtime.     Melatonin 5 MG Caps  Take 5 mg by mouth at bedtime.     multivitamin  tablet  Take 1 tablet by mouth daily.     oseltamivir 30 MG capsule  Commonly known as:  TAMIFLU  Take 1 capsule (30 mg total) by mouth 2 (two) times daily. Please continue through 2/23     PARoxetine 10 MG tablet  Commonly known as:  PAXIL  Take 10 mg by mouth daily.     polyethylene glycol packet  Commonly known as:  MIRALAX / GLYCOLAX  Take 17 g by mouth daily.     tamsulosin 0.4 MG Caps capsule  Commonly known  as:  FLOMAX  Take 0.4 mg by mouth daily.     UNABLE TO FIND  Take 120 mLs by mouth 2 (two) times daily. Med Name: MedPass        Immunizations: Immunization History  Administered Date(s) Administered  . Influenza-Unspecified 11/16/2014  . PPD Test 03/29/2015     Physical Exam: Filed Vitals:   04/01/15 0824  BP: 167/69  Pulse: 74  Temp: 97.4 F (36.3 C)  TempSrc: Oral  Resp: 16  Height: 5\' 7"  (1.702 m)  Weight: 164 lb (74.39 kg)  SpO2: 99%   Body mass index is 25.68 kg/(m^2).  General- elderly male, thin built, in no acute distress Head- normocephalic, atraumatic Nose- no maxillary or frontal sinus tenderness, no nasal discharge Throat- moist mucus membrane  Eyes- PERRLA, EOMI, no pallor, no icterus Neck- no cervical lymphadenopathy Cardiovascular- normal s1,s2, trace leg edema Respiratory- bilateral poor air entry, wheeze + and rhonchi +, no crackles, no use of accessory muscles Abdomen- bowel sounds present, soft, non tender Musculoskeletal- able to move all 4 extremities, generalized weakness, right shoulder ROM limited Neurological- alert and oriented to person, place and time Skin- warm and dry Psychiatry- normal mood and affect    Labs reviewed: Basic Metabolic Panel:  Recent Labs  10/30/14 0111  03/26/15 0801 03/27/15 0358 03/28/15 0421  NA 136  < > 130* 134* 136  K 3.8  < > 4.1 3.6 3.4*  CL 104  < > 96* 100* 101  CO2 23  < > 22 24 27   GLUCOSE 103*  < > 99 105* 94  BUN 21*  < > 17 18 20   CREATININE 1.04  < > 1.03 1.14 1.09   CALCIUM 9.0  < > 8.9 8.2* 8.1*  MG 1.8  --   --   --   --   < > = values in this interval not displayed. Liver Function Tests:  Recent Labs  10/31/14 0500 11/02/14 0450 11/16/14 12/06/14 1940 03/02/15  AST 26 25 25 26 17   ALT <5* <5* 12 <5* 4*  ALKPHOS 52 43 50 53 56  BILITOT 0.9 0.6  --  0.6  --   PROT 5.9* 5.5*  --  7.3  --   ALBUMIN 3.0* 2.8*  --  4.2  --     Recent Labs  10/30/14 0111 12/06/14 1940  LIPASE 22 43    Recent Labs  02/25/15 1442  AMMONIA <9*   CBC:  Recent Labs  02/21/15 1410  03/02/15 03/26/15 0801 03/27/15 0358 03/28/15 0421  WBC 11.0*  < > 8.9 9.5 10.2 6.2  NEUTROABS 9.5*  --  7 8.1*  --   --   HGB 11.4*  < > 10.2* 12.1* 11.5* 10.8*  HCT 35.8*  < > 32* 37.5* 35.8* 33.6*  MCV 94.2  < >  --  94.5 95.7 95.2  PLT 118*  < > 120* 98* 103* 91*  < > = values in this interval not displayed. Cardiac Enzymes: No results for input(s): CKTOTAL, CKMB, CKMBINDEX, TROPONINI in the last 8760 hours. BNP: Invalid input(s): POCBNP CBG:  Recent Labs  03/27/15 0738 03/28/15 0801 03/29/15 0734  GLUCAP 86 97 85    Radiological Exams: Dg Chest 2 View  03/26/2015  CLINICAL DATA:  Fever. EXAM: CHEST  2 VIEW COMPARISON:  February 25, 2015. FINDINGS: Stable cardiomediastinal silhouette. No pneumothorax or pleural effusion is noted. No acute pulmonary disease is noted. Status post kyphoplasty of lower thoracic vertebral body. IMPRESSION: No  active cardiopulmonary disease. Electronically Signed   By: Marijo Conception, M.D.   On: 03/26/2015 08:50   Ct Head Wo Contrast  03/26/2015  CLINICAL DATA:  Altered loss of consciousness.  Eliquis use EXAM: CT HEAD WITHOUT CONTRAST TECHNIQUE: Contiguous axial images were obtained from the base of the skull through the vertex without intravenous contrast. COMPARISON:  02/24/2015 FINDINGS: Skull and Sinuses:Negative for fracture or destructive process. The visualized mastoids, middle ears, and imaged paranasal sinuses are clear.  Visualized orbits: Negative. Brain: No evidence of acute infarction, hemorrhage, hydrocephalus, or mass lesion/mass effect. Generalized cerebral volume loss, mild for age. Chronic small-vessel disease with confluent ischemic gliosis in the deep cerebral white matter. Calcified intracranial atherosclerosis, extensive in the vertebral arteries. IMPRESSION: 1. No acute finding. 2. Moderate chronic small vessel disease. Electronically Signed   By: Monte Fantasia M.D.   On: 03/26/2015 10:32    Assessment/Plan  Physical deconditioning Will have him work with physical therapy and occupational therapy team to help with gait training and muscle strengthening exercises.fall precautions. Skin care. Encourage to be out of bed.   Influenza Continue and complete tamiflu on 04/01/15. Continue tylenol as needed for fever.   Acute bronchitis Completed her azithromycin. Continue mucinex for now and schedule duoneb for qid x 1 week, then change to prn. Provide incentive spirometer and encourage to use it  Iron deficiency Anemia Check cbc. Continue iron supplement  Hypertension Elevated BP. Check bp bid. Continue amlodipine but increase to 5 mg daily. Check bmp BP Readings from Last 3 Encounters:  04/01/15 167/69  03/30/15 121/68  03/29/15 172/71   Atrial fibrillation rate controlled. continue Eliquis 2.5 mg bid for anticoagulation  Protein calorie malnutrition Get dietary consult, add extra protein portion with meals  Parkinson's disease with dementia continue Sinemet current regimen and continue aricept. Fall precautions  Chronic depression  continue Paxil 10 mg daily, mood currently stable  BPH  continue Flomax 0.4 mg daily  Chronic constipation continue Miralax daily for now   Goals of care: short term rehabilitation   Labs/tests ordered: cbc, cmp  Family/ staff Communication: reviewed care plan with patient and nursing supervisor    Blanchie Serve, MD Internal Medicine [place], [place] [address] Cell Phone (Monday-Friday 8 am - 5 pm): [phone] On Call: [phone] and follow prompts after 5 pm and on weekends Office Phone: [phone] Office Fax: [phone]

## 2015-04-09 DIAGNOSIS — E784 Other hyperlipidemia: Secondary | ICD-10-CM | POA: Diagnosis not present

## 2015-04-09 DIAGNOSIS — R2689 Other abnormalities of gait and mobility: Secondary | ICD-10-CM | POA: Diagnosis not present

## 2015-04-09 DIAGNOSIS — M7981 Nontraumatic hematoma of soft tissue: Secondary | ICD-10-CM | POA: Diagnosis not present

## 2015-04-09 DIAGNOSIS — I10 Essential (primary) hypertension: Secondary | ICD-10-CM | POA: Diagnosis not present

## 2015-04-09 DIAGNOSIS — D696 Thrombocytopenia, unspecified: Secondary | ICD-10-CM | POA: Diagnosis not present

## 2015-04-09 DIAGNOSIS — F0390 Unspecified dementia without behavioral disturbance: Secondary | ICD-10-CM | POA: Diagnosis not present

## 2015-04-09 DIAGNOSIS — G20 Parkinson's disease: Secondary | ICD-10-CM | POA: Diagnosis not present

## 2015-04-09 DIAGNOSIS — F329 Major depressive disorder, single episode, unspecified: Secondary | ICD-10-CM | POA: Diagnosis not present

## 2015-04-09 DIAGNOSIS — B9689 Other specified bacterial agents as the cause of diseases classified elsewhere: Secondary | ICD-10-CM | POA: Diagnosis not present

## 2015-04-09 DIAGNOSIS — I480 Paroxysmal atrial fibrillation: Secondary | ICD-10-CM | POA: Diagnosis not present

## 2015-04-09 DIAGNOSIS — I2510 Atherosclerotic heart disease of native coronary artery without angina pectoris: Secondary | ICD-10-CM | POA: Diagnosis not present

## 2015-04-09 DIAGNOSIS — Z6826 Body mass index (BMI) 26.0-26.9, adult: Secondary | ICD-10-CM | POA: Diagnosis not present

## 2015-04-12 DIAGNOSIS — H401123 Primary open-angle glaucoma, left eye, severe stage: Secondary | ICD-10-CM | POA: Diagnosis not present

## 2015-04-12 DIAGNOSIS — H401113 Primary open-angle glaucoma, right eye, severe stage: Secondary | ICD-10-CM | POA: Diagnosis not present

## 2015-04-13 ENCOUNTER — Telehealth: Payer: Self-pay | Admitting: Neurology

## 2015-04-13 DIAGNOSIS — R627 Adult failure to thrive: Secondary | ICD-10-CM | POA: Diagnosis not present

## 2015-04-13 DIAGNOSIS — D696 Thrombocytopenia, unspecified: Secondary | ICD-10-CM | POA: Diagnosis not present

## 2015-04-13 DIAGNOSIS — G20 Parkinson's disease: Secondary | ICD-10-CM | POA: Diagnosis not present

## 2015-04-13 DIAGNOSIS — F329 Major depressive disorder, single episode, unspecified: Secondary | ICD-10-CM | POA: Diagnosis not present

## 2015-04-13 DIAGNOSIS — D649 Anemia, unspecified: Secondary | ICD-10-CM | POA: Diagnosis not present

## 2015-04-13 DIAGNOSIS — I4891 Unspecified atrial fibrillation: Secondary | ICD-10-CM | POA: Diagnosis not present

## 2015-04-13 DIAGNOSIS — Z9181 History of falling: Secondary | ICD-10-CM | POA: Diagnosis not present

## 2015-04-13 DIAGNOSIS — F0280 Dementia in other diseases classified elsewhere without behavioral disturbance: Secondary | ICD-10-CM | POA: Diagnosis not present

## 2015-04-13 DIAGNOSIS — Z8701 Personal history of pneumonia (recurrent): Secondary | ICD-10-CM | POA: Diagnosis not present

## 2015-04-13 DIAGNOSIS — I2510 Atherosclerotic heart disease of native coronary artery without angina pectoris: Secondary | ICD-10-CM | POA: Diagnosis not present

## 2015-04-13 DIAGNOSIS — I10 Essential (primary) hypertension: Secondary | ICD-10-CM | POA: Diagnosis not present

## 2015-04-13 DIAGNOSIS — E46 Unspecified protein-calorie malnutrition: Secondary | ICD-10-CM | POA: Diagnosis not present

## 2015-04-13 NOTE — Telephone Encounter (Signed)
Left message for Henry Alvarez to call me back.

## 2015-04-13 NOTE — Telephone Encounter (Signed)
PT's son drake called and would like a call back in regards to PT/Dawn [phone]

## 2015-04-14 DIAGNOSIS — I4891 Unspecified atrial fibrillation: Secondary | ICD-10-CM | POA: Diagnosis not present

## 2015-04-14 DIAGNOSIS — D649 Anemia, unspecified: Secondary | ICD-10-CM | POA: Diagnosis not present

## 2015-04-14 DIAGNOSIS — G20 Parkinson's disease: Secondary | ICD-10-CM | POA: Diagnosis not present

## 2015-04-14 DIAGNOSIS — I2510 Atherosclerotic heart disease of native coronary artery without angina pectoris: Secondary | ICD-10-CM | POA: Diagnosis not present

## 2015-04-14 DIAGNOSIS — I10 Essential (primary) hypertension: Secondary | ICD-10-CM | POA: Diagnosis not present

## 2015-04-14 DIAGNOSIS — F0280 Dementia in other diseases classified elsewhere without behavioral disturbance: Secondary | ICD-10-CM | POA: Diagnosis not present

## 2015-04-15 ENCOUNTER — Telehealth: Payer: Self-pay | Admitting: Neurology

## 2015-04-15 NOTE — Telephone Encounter (Signed)
Apologizes, he is currently taking the 1.5 tablets of cabidopa-levodopa 25-100 Q.I.D

## 2015-04-15 NOTE — Telephone Encounter (Signed)
Spoke with Henry Alvarez. Pt aquired Type A flu about 3 weeks ago, spent 3 days in [place] before being transferred to [facility] for physical therapy. Son, Henry Alvarez, states that while at [employer]. He noticed pt's R hand shaking worse than ever. While at [facility] place son and daughter noticed the shaking in the hand continuing as well as both of his feet if he was off of them, right was worse than left. Pt has now been home since Saturday. Shaking continues, unsure if there has been any improvement in this. Have appointment scheduled with you for the 11th. Son would like to know if this is normal? Is there anything they can do? Should appointment on the 11th be kept? Please advise.

## 2015-04-15 NOTE — Telephone Encounter (Signed)
It is common for Parkinson symptoms to get worse after an illness.  If he is only taking 1 tablet of Sinemet four times daily, he can increase dose to 1 and 1/2 tablets four times daily.

## 2015-04-15 NOTE — Telephone Encounter (Signed)
Pt son Henry Alvarez would like to talk to someone about his dad he has some shaking going on please call [phone]

## 2015-04-15 NOTE — Telephone Encounter (Signed)
Have you spoken to patient's son.  He never called me back.

## 2015-04-15 NOTE — Telephone Encounter (Signed)
Son called back, states that pt is taking the carbidopa-levodopa (SINEMET IR) 25-100 MG tablet Q.I.D. States he did contact sibling who handles medication to verify.

## 2015-04-15 NOTE — Telephone Encounter (Signed)
Then increase Sinemet dosing as follows:  2 tablets at 8-8:30 am, 1.5 tablets at 1-1:30pm, 2 tablets at 6 pm and 1.5 tablets at bedtime.

## 2015-04-15 NOTE — Telephone Encounter (Signed)
Son, [name], aware.

## 2015-04-15 NOTE — Telephone Encounter (Signed)
No. Not spoken to anyone about this pt. Will attempt to reach later today.

## 2015-04-16 DIAGNOSIS — I10 Essential (primary) hypertension: Secondary | ICD-10-CM | POA: Diagnosis not present

## 2015-04-16 DIAGNOSIS — G20 Parkinson's disease: Secondary | ICD-10-CM | POA: Diagnosis not present

## 2015-04-16 DIAGNOSIS — I2510 Atherosclerotic heart disease of native coronary artery without angina pectoris: Secondary | ICD-10-CM | POA: Diagnosis not present

## 2015-04-16 DIAGNOSIS — I4891 Unspecified atrial fibrillation: Secondary | ICD-10-CM | POA: Diagnosis not present

## 2015-04-16 DIAGNOSIS — D649 Anemia, unspecified: Secondary | ICD-10-CM | POA: Diagnosis not present

## 2015-04-16 DIAGNOSIS — F0280 Dementia in other diseases classified elsewhere without behavioral disturbance: Secondary | ICD-10-CM | POA: Diagnosis not present

## 2015-04-19 DIAGNOSIS — F0390 Unspecified dementia without behavioral disturbance: Secondary | ICD-10-CM | POA: Diagnosis not present

## 2015-04-19 DIAGNOSIS — Z6826 Body mass index (BMI) 26.0-26.9, adult: Secondary | ICD-10-CM | POA: Diagnosis not present

## 2015-04-19 DIAGNOSIS — J09X2 Influenza due to identified novel influenza A virus with other respiratory manifestations: Secondary | ICD-10-CM | POA: Diagnosis not present

## 2015-04-19 DIAGNOSIS — D6489 Other specified anemias: Secondary | ICD-10-CM | POA: Diagnosis not present

## 2015-04-19 DIAGNOSIS — I480 Paroxysmal atrial fibrillation: Secondary | ICD-10-CM | POA: Diagnosis not present

## 2015-04-20 DIAGNOSIS — I10 Essential (primary) hypertension: Secondary | ICD-10-CM | POA: Diagnosis not present

## 2015-04-20 DIAGNOSIS — G20 Parkinson's disease: Secondary | ICD-10-CM | POA: Diagnosis not present

## 2015-04-20 DIAGNOSIS — I4891 Unspecified atrial fibrillation: Secondary | ICD-10-CM | POA: Diagnosis not present

## 2015-04-20 DIAGNOSIS — I2510 Atherosclerotic heart disease of native coronary artery without angina pectoris: Secondary | ICD-10-CM | POA: Diagnosis not present

## 2015-04-20 DIAGNOSIS — F0280 Dementia in other diseases classified elsewhere without behavioral disturbance: Secondary | ICD-10-CM | POA: Diagnosis not present

## 2015-04-20 DIAGNOSIS — D649 Anemia, unspecified: Secondary | ICD-10-CM | POA: Diagnosis not present

## 2015-04-22 DIAGNOSIS — D649 Anemia, unspecified: Secondary | ICD-10-CM | POA: Diagnosis not present

## 2015-04-22 DIAGNOSIS — I4891 Unspecified atrial fibrillation: Secondary | ICD-10-CM | POA: Diagnosis not present

## 2015-04-22 DIAGNOSIS — I2510 Atherosclerotic heart disease of native coronary artery without angina pectoris: Secondary | ICD-10-CM | POA: Diagnosis not present

## 2015-04-22 DIAGNOSIS — F0280 Dementia in other diseases classified elsewhere without behavioral disturbance: Secondary | ICD-10-CM | POA: Diagnosis not present

## 2015-04-22 DIAGNOSIS — I10 Essential (primary) hypertension: Secondary | ICD-10-CM | POA: Diagnosis not present

## 2015-04-22 DIAGNOSIS — G20 Parkinson's disease: Secondary | ICD-10-CM | POA: Diagnosis not present

## 2015-04-23 DIAGNOSIS — I10 Essential (primary) hypertension: Secondary | ICD-10-CM | POA: Diagnosis not present

## 2015-04-23 DIAGNOSIS — G20 Parkinson's disease: Secondary | ICD-10-CM | POA: Diagnosis not present

## 2015-04-23 DIAGNOSIS — D649 Anemia, unspecified: Secondary | ICD-10-CM | POA: Diagnosis not present

## 2015-04-23 DIAGNOSIS — I2510 Atherosclerotic heart disease of native coronary artery without angina pectoris: Secondary | ICD-10-CM | POA: Diagnosis not present

## 2015-04-23 DIAGNOSIS — I4891 Unspecified atrial fibrillation: Secondary | ICD-10-CM | POA: Diagnosis not present

## 2015-04-23 DIAGNOSIS — F0280 Dementia in other diseases classified elsewhere without behavioral disturbance: Secondary | ICD-10-CM | POA: Diagnosis not present

## 2015-04-27 DIAGNOSIS — F0280 Dementia in other diseases classified elsewhere without behavioral disturbance: Secondary | ICD-10-CM | POA: Diagnosis not present

## 2015-04-27 DIAGNOSIS — I2510 Atherosclerotic heart disease of native coronary artery without angina pectoris: Secondary | ICD-10-CM | POA: Diagnosis not present

## 2015-04-27 DIAGNOSIS — I10 Essential (primary) hypertension: Secondary | ICD-10-CM | POA: Diagnosis not present

## 2015-04-27 DIAGNOSIS — I4891 Unspecified atrial fibrillation: Secondary | ICD-10-CM | POA: Diagnosis not present

## 2015-04-27 DIAGNOSIS — G20 Parkinson's disease: Secondary | ICD-10-CM | POA: Diagnosis not present

## 2015-04-27 DIAGNOSIS — D649 Anemia, unspecified: Secondary | ICD-10-CM | POA: Diagnosis not present

## 2015-04-30 DIAGNOSIS — F0280 Dementia in other diseases classified elsewhere without behavioral disturbance: Secondary | ICD-10-CM | POA: Diagnosis not present

## 2015-04-30 DIAGNOSIS — I4891 Unspecified atrial fibrillation: Secondary | ICD-10-CM | POA: Diagnosis not present

## 2015-04-30 DIAGNOSIS — I2510 Atherosclerotic heart disease of native coronary artery without angina pectoris: Secondary | ICD-10-CM | POA: Diagnosis not present

## 2015-04-30 DIAGNOSIS — G20 Parkinson's disease: Secondary | ICD-10-CM | POA: Diagnosis not present

## 2015-04-30 DIAGNOSIS — I10 Essential (primary) hypertension: Secondary | ICD-10-CM | POA: Diagnosis not present

## 2015-04-30 DIAGNOSIS — D649 Anemia, unspecified: Secondary | ICD-10-CM | POA: Diagnosis not present

## 2015-05-03 DIAGNOSIS — L570 Actinic keratosis: Secondary | ICD-10-CM | POA: Diagnosis not present

## 2015-05-04 DIAGNOSIS — D649 Anemia, unspecified: Secondary | ICD-10-CM | POA: Diagnosis not present

## 2015-05-04 DIAGNOSIS — F0280 Dementia in other diseases classified elsewhere without behavioral disturbance: Secondary | ICD-10-CM | POA: Diagnosis not present

## 2015-05-04 DIAGNOSIS — I10 Essential (primary) hypertension: Secondary | ICD-10-CM | POA: Diagnosis not present

## 2015-05-04 DIAGNOSIS — G20 Parkinson's disease: Secondary | ICD-10-CM | POA: Diagnosis not present

## 2015-05-04 DIAGNOSIS — I2510 Atherosclerotic heart disease of native coronary artery without angina pectoris: Secondary | ICD-10-CM | POA: Diagnosis not present

## 2015-05-04 DIAGNOSIS — I4891 Unspecified atrial fibrillation: Secondary | ICD-10-CM | POA: Diagnosis not present

## 2015-05-06 DIAGNOSIS — F0280 Dementia in other diseases classified elsewhere without behavioral disturbance: Secondary | ICD-10-CM | POA: Diagnosis not present

## 2015-05-06 DIAGNOSIS — G20 Parkinson's disease: Secondary | ICD-10-CM | POA: Diagnosis not present

## 2015-05-06 DIAGNOSIS — I2510 Atherosclerotic heart disease of native coronary artery without angina pectoris: Secondary | ICD-10-CM | POA: Diagnosis not present

## 2015-05-06 DIAGNOSIS — I10 Essential (primary) hypertension: Secondary | ICD-10-CM | POA: Diagnosis not present

## 2015-05-06 DIAGNOSIS — D649 Anemia, unspecified: Secondary | ICD-10-CM | POA: Diagnosis not present

## 2015-05-06 DIAGNOSIS — I4891 Unspecified atrial fibrillation: Secondary | ICD-10-CM | POA: Diagnosis not present

## 2015-05-11 DIAGNOSIS — I4891 Unspecified atrial fibrillation: Secondary | ICD-10-CM | POA: Diagnosis not present

## 2015-05-11 DIAGNOSIS — I10 Essential (primary) hypertension: Secondary | ICD-10-CM | POA: Diagnosis not present

## 2015-05-11 DIAGNOSIS — D649 Anemia, unspecified: Secondary | ICD-10-CM | POA: Diagnosis not present

## 2015-05-11 DIAGNOSIS — I2510 Atherosclerotic heart disease of native coronary artery without angina pectoris: Secondary | ICD-10-CM | POA: Diagnosis not present

## 2015-05-11 DIAGNOSIS — G20 Parkinson's disease: Secondary | ICD-10-CM | POA: Diagnosis not present

## 2015-05-11 DIAGNOSIS — F0280 Dementia in other diseases classified elsewhere without behavioral disturbance: Secondary | ICD-10-CM | POA: Diagnosis not present

## 2015-05-13 DIAGNOSIS — I2510 Atherosclerotic heart disease of native coronary artery without angina pectoris: Secondary | ICD-10-CM | POA: Diagnosis not present

## 2015-05-13 DIAGNOSIS — G20 Parkinson's disease: Secondary | ICD-10-CM | POA: Diagnosis not present

## 2015-05-13 DIAGNOSIS — F0280 Dementia in other diseases classified elsewhere without behavioral disturbance: Secondary | ICD-10-CM | POA: Diagnosis not present

## 2015-05-13 DIAGNOSIS — I10 Essential (primary) hypertension: Secondary | ICD-10-CM | POA: Diagnosis not present

## 2015-05-13 DIAGNOSIS — D649 Anemia, unspecified: Secondary | ICD-10-CM | POA: Diagnosis not present

## 2015-05-13 DIAGNOSIS — I4891 Unspecified atrial fibrillation: Secondary | ICD-10-CM | POA: Diagnosis not present

## 2015-05-18 ENCOUNTER — Ambulatory Visit (INDEPENDENT_AMBULATORY_CARE_PROVIDER_SITE_OTHER): Payer: Medicare Other | Admitting: Neurology

## 2015-05-18 ENCOUNTER — Encounter: Payer: Self-pay | Admitting: Neurology

## 2015-05-18 VITALS — BP 140/78 | HR 57 | Wt 172.0 lb

## 2015-05-18 DIAGNOSIS — G20 Parkinson's disease: Secondary | ICD-10-CM | POA: Diagnosis not present

## 2015-05-18 MED ORDER — CARBIDOPA-LEVODOPA ER 50-200 MG PO TBCR: 1.0000 | EXTENDED_RELEASE_TABLET | Freq: Every day | ORAL | Status: DC

## 2015-05-18 MED ORDER — CARBIDOPA-LEVODOPA 25-100 MG PO TABS: ORAL_TABLET | ORAL | Status: DC

## 2015-05-18 NOTE — Progress Notes (Signed)
Chart forwarded.  

## 2015-05-18 NOTE — Patient Instructions (Signed)
1.  Increase carbidopa-levodopa 25/100mg  to 2 tablets at 8am, 1pm and 6pm.  2.  Instead of taking 2 tablets of 25/100mg  at bedtime, take the carbidopa-levodopa CR 50/100mg  at bedtime.  3.  Contact us in 1 week with update.

## 2015-05-18 NOTE — Progress Notes (Signed)
NEUROLOGY FOLLOW UP OFFICE NOTE  Henry Alvarez [id number]  HISTORY OF PRESENT ILLNESS: Henry Alvarez is an 82 year old man with history of a-fib (on warfarin), TIA, OA and HTN who presents for follow up regarding idiopathic Parkinson's disease.  He is accompanied by his son who provides some history.  Recent history also obtained by hospital notes.  Labs and imaging of head CT reviewed.  UPDATE: He was hospitalized in February for fever and congestion.  CT of head showed no acute findings.  He was discharged on Tamiflu.  Since then, he has had increased tremor of the right hand.  It occurs only at rest and waxes and wanes.  It is usually prominent when he is sitting and preoccupied, such as watching TV.  Sometimes, his right leg will shake too.  Sinemet was increased to 2 tablets at 8-8:30 am, 1.5 tablets at 1-1:30pm, 2 tablets at 6 pm and 1.5 tablets at bedtime.  There hasn't been too much of a difference.      HISTORY: Since 2010, he began experiencing tremors in the hand. It occurs mainly in the right hand, although this may be noticeable because he is right-handed. He has difficulty picking up utensils. He doesn't really notice it at rest. He also notes a tremor of his mouth. He said several falls over the last few years. He does have a walker at home, but instead uses a cane regularly. He does note symptoms consistent with freezing when he gets up from a chair. He has had sleep issues in the past, such as frequently waking up at night, but it has been better lately. He doesn't appear to have any history of REM sleep behavior disorder or nightmares. He does note some problems swallowing liquids. His sense of smell is okay. Another problem has been hoarseness. He has had vocal cord surgery to help with his hoarseness.  He denies any symptoms consistent with depression. He does drive without issues.  He often repeats questions to his son.  He had a modified barium swallow on 06/08/14, which  revealed moderate sensorimotor pharyngeal dysphagia with delayed swallow initiation, decreased laryngeal elevation and laryngeal closure, resulting in silent aspiration.  He was taught techniques such as chin tuck and superglottic swallow.  Regular diet and tectar thick liquids, chin tuck with liquids, swallow twice and pills whole with applesauce were recommended.  He has history of several surgeries, including his back and bilateral knees.   PAST MEDICAL HISTORY: Past Medical History  Diagnosis Date  . Arthritis   . Atrial fibrillation ([place])   . Parkinson disease ([place])   . Depression   . Anemia   . Iron deficiency anemia   . BPH (benign prostatic hyperplasia)   . Dementia   . Hypertension   . Thrombocytopenia ([place])   . C. difficile colitis     hx of cdiff last fall 2016     MEDICATIONS: Current Outpatient Prescriptions on File Prior to Visit  Medication Sig Dispense Refill  . acetaminophen (TYLENOL) 325 MG tablet Take 2 tablets (650 mg total) by mouth every 4 (four) hours as needed for mild pain, fever or headache. 30 tablet 0  . amLODipine (NORVASC) 2.5 MG tablet Take 2.5 mg by mouth daily.    Marland Kitchen donepezil (ARICEPT) 10 MG tablet Take 1 tablet (10 mg total) by mouth at bedtime. 30 tablet 3  . ELIQUIS 2.5 MG TABS tablet Take 2.5 mg by mouth 2 (two) times daily.    Marland Kitchen  guaiFENesin (MUCINEX) 600 MG 12 hr tablet Take 600 mg by mouth 2 (two) times daily. Stop date 04/05/15    . hydrocortisone 2.5 % cream Apply 1 application topically daily as needed (irritation/dryness). To face and nose    . ipratropium-albuterol (DUONEB) 0.5-2.5 (3) MG/3ML SOLN Take 3 mLs by nebulization every 6 (six) hours as needed. 360 mL   . iron polysaccharides (NIFEREX) 150 MG capsule Take 150 mg by mouth daily.    Marland Kitchen ketoconazole (NIZORAL) 2 % cream Apply 1 application topically daily as needed for irritation. Face and nose    . latanoprost (XALATAN) 0.005 % ophthalmic solution Place 1 drop into both eyes at  bedtime.     . Melatonin 5 MG CAPS Take 5 mg by mouth at bedtime.    . Multiple Vitamin (MULTIVITAMIN) tablet Take 1 tablet by mouth daily.    Marland Kitchen PARoxetine (PAXIL) 10 MG tablet Take 10 mg by mouth daily.    . polyethylene glycol (MIRALAX / GLYCOLAX) packet Take 17 g by mouth daily.    . Saccharomyces boulardii (FLORASTOR PO) Take 1 capsule by mouth daily.     . tamsulosin (FLOMAX) 0.4 MG CAPS capsule Take 0.4 mg by mouth daily.     Marland Kitchen UNABLE TO FIND Take 120 mLs by mouth 2 (two) times daily. Med Name: Med[facility]     No current facility-administered medications on file prior to visit.    ALLERGIES: Allergies  Allergen Reactions  . Ciprocin-Fluocin-Procin [Fluocinolone]     Past reactions to "quinolones"-just not to have it per Family member.     FAMILY HISTORY: Family History  Problem Relation Age of Onset  . Heart attack Father   . Angina Father   . Heart failure Mother   . Hypertension Sister   . Heart disease      grandmother  . Heart failure Mother   . Colon cancer Neg Hx   . Esophageal cancer Neg Hx   . Kidney disease Neg Hx   . Liver disease Neg Hx     SOCIAL HISTORY: Social History   Social History  . Marital Status: Widowed    Spouse Name: N/A  . Number of Children: 2  . Years of Education: N/A   Occupational History  . Retired Nurse, mental health    Social History Main Topics  . Smoking status: Never Smoker   . Smokeless tobacco: Never Used  . Alcohol Use: No     Comment: 09/05/2012 "rarely; I had a glass of wine ~ 1 month ago"  . Drug Use: No  . Sexual Activity: No   Other Topics Concern  . Not on file   Social History Narrative    REVIEW OF SYSTEMS: Constitutional: No fevers, chills, or sweats, no generalized fatigue, change in appetite Eyes: No visual changes, double vision, eye pain Ear, nose and throat: No hearing loss, ear pain, nasal congestion, sore throat Cardiovascular: No chest pain, palpitations Respiratory:  No shortness of breath at rest  or with exertion, wheezes GastrointestinaI: No nausea, vomiting, diarrhea, abdominal pain, fecal incontinence Genitourinary:  No dysuria, urinary retention or frequency Musculoskeletal:  No neck pain, back pain Integumentary: No rash, pruritus, skin lesions Neurological: as above Psychiatric: No depression, insomnia, anxiety Endocrine: No palpitations, fatigue, diaphoresis, mood swings, change in appetite, change in weight, increased thirst Hematologic/Lymphatic:  No anemia, purpura, petechiae. Allergic/Immunologic: no itchy/runny eyes, nasal congestion, recent allergic reactions, rashes  PHYSICAL EXAM: Filed Vitals:   05/18/15 1100  BP: 140/78  Pulse: 57  General: No acute distress.  Patient appears well-groomed.  normal body habitus. Head:  Normocephalic/atraumatic  Neurological Exam: alert and oriented to person, place, and time. Attention span and concentration mildly impaired.  recalled 3 of 3 words after 5 minutes, remote memory intact, fund of knowledge intact.  Speech fluent and not dysarthric, language intact.  Hypomimia and hypophonia.  Otherwise, CN II-XII intact. Resting tremor of right hand noted.  Mild postural tremor noted in both hands.  Bradykinesia and cogwheel rigidity..  Bulk normal, muscle strength 5/5 throughout.  Deep tendon reflexes 1+ in upper extremities, otherwise absent.   Finger to nose without dysmetria.  Able to pick up feet when walking with cane.    IMPRESSION: Parkinson's disease  PLAN: 1.  Will increase Sinemet 25/100mg  to 2 tablets at 8am, 2 tablets at 1pm and 2 tablets at 6pm.  We will change the bedtime dose to Sinemet CR 50/200mg . 2.  Contact us in 1 week with update 3.  Follow up as already scheduled.   Metta Clines, DO  CC:  Reynold Bowen, MD

## 2015-05-25 ENCOUNTER — Other Ambulatory Visit: Payer: Self-pay | Admitting: Neurology

## 2015-05-25 DIAGNOSIS — D649 Anemia, unspecified: Secondary | ICD-10-CM | POA: Diagnosis not present

## 2015-05-25 DIAGNOSIS — I10 Essential (primary) hypertension: Secondary | ICD-10-CM | POA: Diagnosis not present

## 2015-05-25 DIAGNOSIS — F0280 Dementia in other diseases classified elsewhere without behavioral disturbance: Secondary | ICD-10-CM | POA: Diagnosis not present

## 2015-05-25 DIAGNOSIS — I2510 Atherosclerotic heart disease of native coronary artery without angina pectoris: Secondary | ICD-10-CM | POA: Diagnosis not present

## 2015-05-25 DIAGNOSIS — I4891 Unspecified atrial fibrillation: Secondary | ICD-10-CM | POA: Diagnosis not present

## 2015-05-25 DIAGNOSIS — G20 Parkinson's disease: Secondary | ICD-10-CM | POA: Diagnosis not present

## 2015-05-27 DIAGNOSIS — D649 Anemia, unspecified: Secondary | ICD-10-CM | POA: Diagnosis not present

## 2015-05-27 DIAGNOSIS — F0280 Dementia in other diseases classified elsewhere without behavioral disturbance: Secondary | ICD-10-CM | POA: Diagnosis not present

## 2015-05-27 DIAGNOSIS — I4891 Unspecified atrial fibrillation: Secondary | ICD-10-CM | POA: Diagnosis not present

## 2015-05-27 DIAGNOSIS — I2510 Atherosclerotic heart disease of native coronary artery without angina pectoris: Secondary | ICD-10-CM | POA: Diagnosis not present

## 2015-05-27 DIAGNOSIS — G20 Parkinson's disease: Secondary | ICD-10-CM | POA: Diagnosis not present

## 2015-05-27 DIAGNOSIS — I10 Essential (primary) hypertension: Secondary | ICD-10-CM | POA: Diagnosis not present

## 2015-05-31 DIAGNOSIS — M810 Age-related osteoporosis without current pathological fracture: Secondary | ICD-10-CM | POA: Diagnosis not present

## 2015-05-31 DIAGNOSIS — I10 Essential (primary) hypertension: Secondary | ICD-10-CM | POA: Diagnosis not present

## 2015-05-31 DIAGNOSIS — Z125 Encounter for screening for malignant neoplasm of prostate: Secondary | ICD-10-CM | POA: Diagnosis not present

## 2015-05-31 DIAGNOSIS — D649 Anemia, unspecified: Secondary | ICD-10-CM | POA: Diagnosis not present

## 2015-06-01 ENCOUNTER — Other Ambulatory Visit: Payer: Self-pay | Admitting: Adult Health

## 2015-06-01 DIAGNOSIS — I4891 Unspecified atrial fibrillation: Secondary | ICD-10-CM | POA: Diagnosis not present

## 2015-06-01 DIAGNOSIS — G20 Parkinson's disease: Secondary | ICD-10-CM | POA: Diagnosis not present

## 2015-06-01 DIAGNOSIS — I2510 Atherosclerotic heart disease of native coronary artery without angina pectoris: Secondary | ICD-10-CM | POA: Diagnosis not present

## 2015-06-01 DIAGNOSIS — I10 Essential (primary) hypertension: Secondary | ICD-10-CM | POA: Diagnosis not present

## 2015-06-01 DIAGNOSIS — F0280 Dementia in other diseases classified elsewhere without behavioral disturbance: Secondary | ICD-10-CM | POA: Diagnosis not present

## 2015-06-01 DIAGNOSIS — D649 Anemia, unspecified: Secondary | ICD-10-CM | POA: Diagnosis not present

## 2015-06-03 DIAGNOSIS — D649 Anemia, unspecified: Secondary | ICD-10-CM | POA: Diagnosis not present

## 2015-06-03 DIAGNOSIS — I2510 Atherosclerotic heart disease of native coronary artery without angina pectoris: Secondary | ICD-10-CM | POA: Diagnosis not present

## 2015-06-03 DIAGNOSIS — F0280 Dementia in other diseases classified elsewhere without behavioral disturbance: Secondary | ICD-10-CM | POA: Diagnosis not present

## 2015-06-03 DIAGNOSIS — G20 Parkinson's disease: Secondary | ICD-10-CM | POA: Diagnosis not present

## 2015-06-03 DIAGNOSIS — I10 Essential (primary) hypertension: Secondary | ICD-10-CM | POA: Diagnosis not present

## 2015-06-03 DIAGNOSIS — I4891 Unspecified atrial fibrillation: Secondary | ICD-10-CM | POA: Diagnosis not present

## 2015-06-07 DIAGNOSIS — R2689 Other abnormalities of gait and mobility: Secondary | ICD-10-CM | POA: Diagnosis not present

## 2015-06-07 DIAGNOSIS — I480 Paroxysmal atrial fibrillation: Secondary | ICD-10-CM | POA: Diagnosis not present

## 2015-06-07 DIAGNOSIS — I2510 Atherosclerotic heart disease of native coronary artery without angina pectoris: Secondary | ICD-10-CM | POA: Diagnosis not present

## 2015-06-07 DIAGNOSIS — Z23 Encounter for immunization: Secondary | ICD-10-CM | POA: Diagnosis not present

## 2015-06-07 DIAGNOSIS — Z6828 Body mass index (BMI) 28.0-28.9, adult: Secondary | ICD-10-CM | POA: Diagnosis not present

## 2015-06-07 DIAGNOSIS — I10 Essential (primary) hypertension: Secondary | ICD-10-CM | POA: Diagnosis not present

## 2015-06-07 DIAGNOSIS — D696 Thrombocytopenia, unspecified: Secondary | ICD-10-CM | POA: Diagnosis not present

## 2015-06-07 DIAGNOSIS — D6489 Other specified anemias: Secondary | ICD-10-CM | POA: Diagnosis not present

## 2015-06-07 DIAGNOSIS — G20 Parkinson's disease: Secondary | ICD-10-CM | POA: Diagnosis not present

## 2015-06-07 DIAGNOSIS — Z1389 Encounter for screening for other disorder: Secondary | ICD-10-CM | POA: Diagnosis not present

## 2015-06-07 DIAGNOSIS — E784 Other hyperlipidemia: Secondary | ICD-10-CM | POA: Diagnosis not present

## 2015-06-07 DIAGNOSIS — G458 Other transient cerebral ischemic attacks and related syndromes: Secondary | ICD-10-CM | POA: Diagnosis not present

## 2015-06-07 DIAGNOSIS — Z0000 Encounter for general adult medical examination without abnormal findings: Secondary | ICD-10-CM | POA: Diagnosis not present

## 2015-06-08 DIAGNOSIS — F0280 Dementia in other diseases classified elsewhere without behavioral disturbance: Secondary | ICD-10-CM | POA: Diagnosis not present

## 2015-06-08 DIAGNOSIS — I2510 Atherosclerotic heart disease of native coronary artery without angina pectoris: Secondary | ICD-10-CM | POA: Diagnosis not present

## 2015-06-08 DIAGNOSIS — I10 Essential (primary) hypertension: Secondary | ICD-10-CM | POA: Diagnosis not present

## 2015-06-08 DIAGNOSIS — D649 Anemia, unspecified: Secondary | ICD-10-CM | POA: Diagnosis not present

## 2015-06-08 DIAGNOSIS — I4891 Unspecified atrial fibrillation: Secondary | ICD-10-CM | POA: Diagnosis not present

## 2015-06-08 DIAGNOSIS — G20 Parkinson's disease: Secondary | ICD-10-CM | POA: Diagnosis not present

## 2015-06-10 DIAGNOSIS — I2510 Atherosclerotic heart disease of native coronary artery without angina pectoris: Secondary | ICD-10-CM | POA: Diagnosis not present

## 2015-06-10 DIAGNOSIS — G20 Parkinson's disease: Secondary | ICD-10-CM | POA: Diagnosis not present

## 2015-06-10 DIAGNOSIS — F0280 Dementia in other diseases classified elsewhere without behavioral disturbance: Secondary | ICD-10-CM | POA: Diagnosis not present

## 2015-06-10 DIAGNOSIS — I4891 Unspecified atrial fibrillation: Secondary | ICD-10-CM | POA: Diagnosis not present

## 2015-06-10 DIAGNOSIS — D649 Anemia, unspecified: Secondary | ICD-10-CM | POA: Diagnosis not present

## 2015-06-10 DIAGNOSIS — I10 Essential (primary) hypertension: Secondary | ICD-10-CM | POA: Diagnosis not present

## 2015-07-12 DIAGNOSIS — H401133 Primary open-angle glaucoma, bilateral, severe stage: Secondary | ICD-10-CM | POA: Diagnosis not present

## 2015-07-12 DIAGNOSIS — H04123 Dry eye syndrome of bilateral lacrimal glands: Secondary | ICD-10-CM | POA: Diagnosis not present

## 2015-08-18 IMAGING — CR DG HIP (WITH OR WITHOUT PELVIS) 2-3V*R*
3 series · 3 of 3 positions shown · non-contrast
Comparison: 06/27/2012

CLINICAL DATA: Fall on the sidewalk. Right hip pain. Initial
encounter.

EXAM:
RIGHT HIP (WITH PELVIS) 2-3 VIEWS

[t pelvis ap]
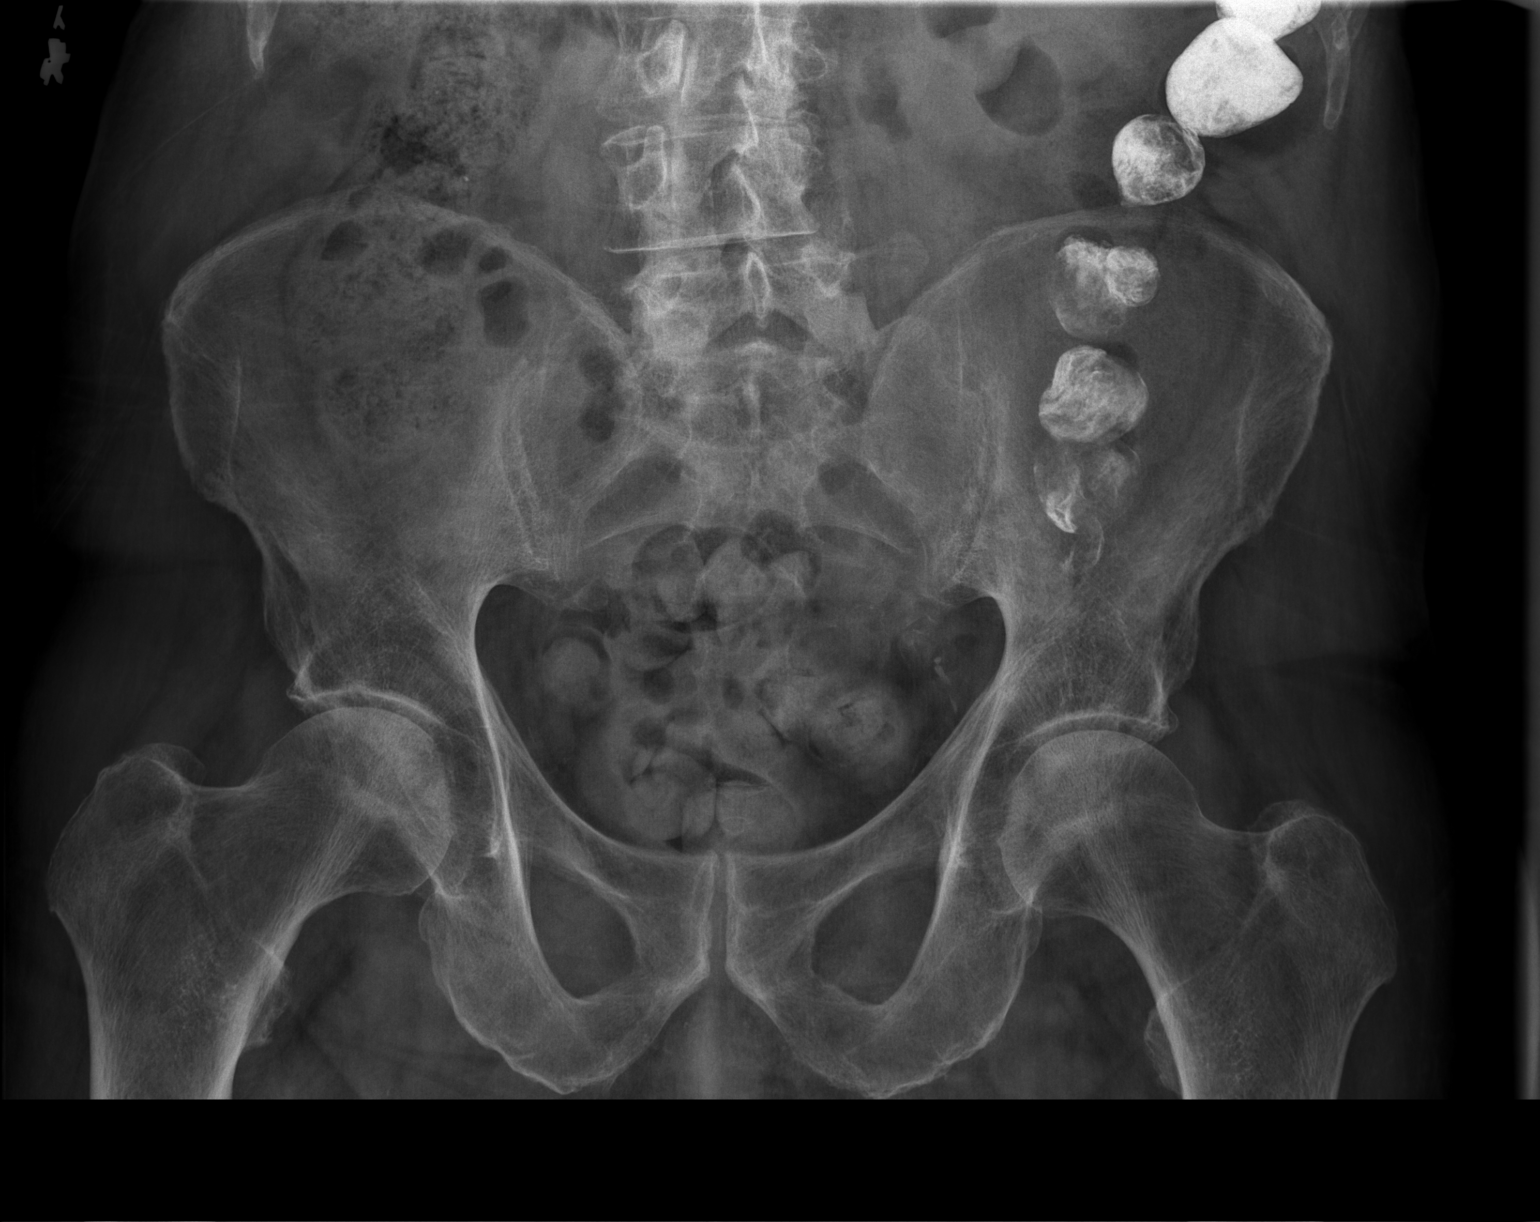

[t hip ap right]
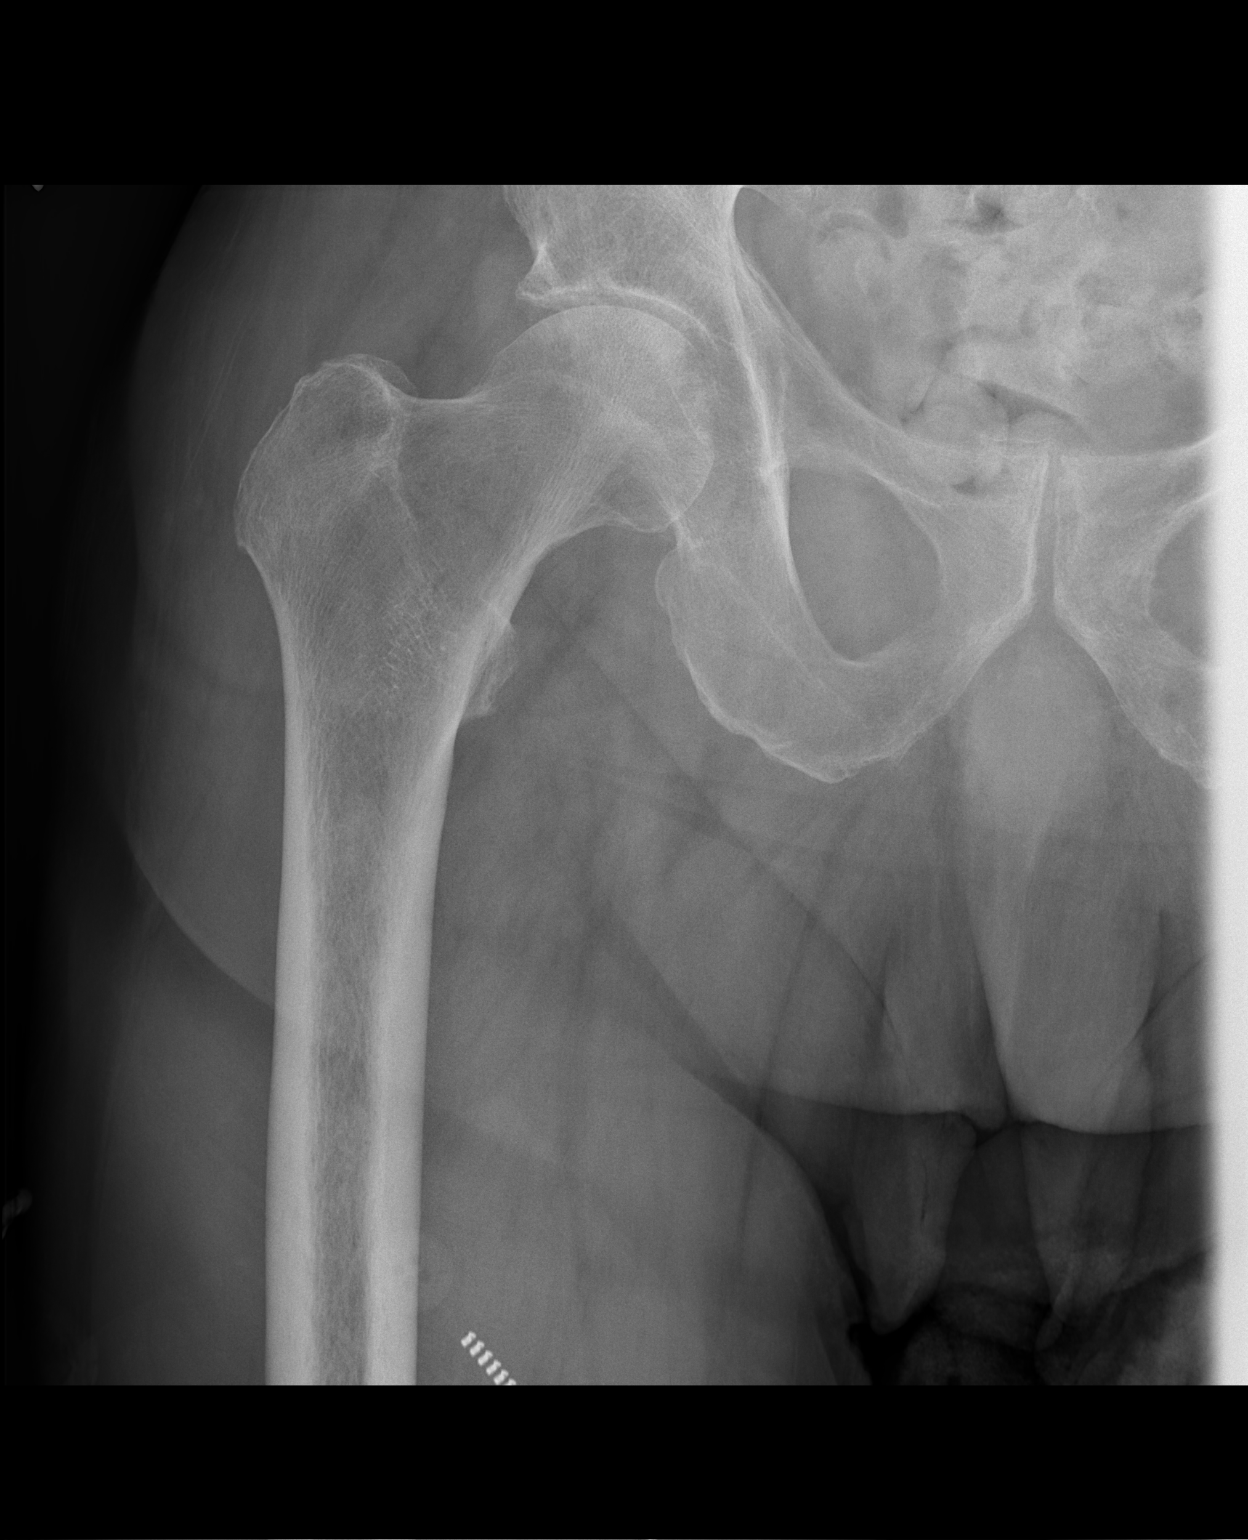

[t hip frog leg right]
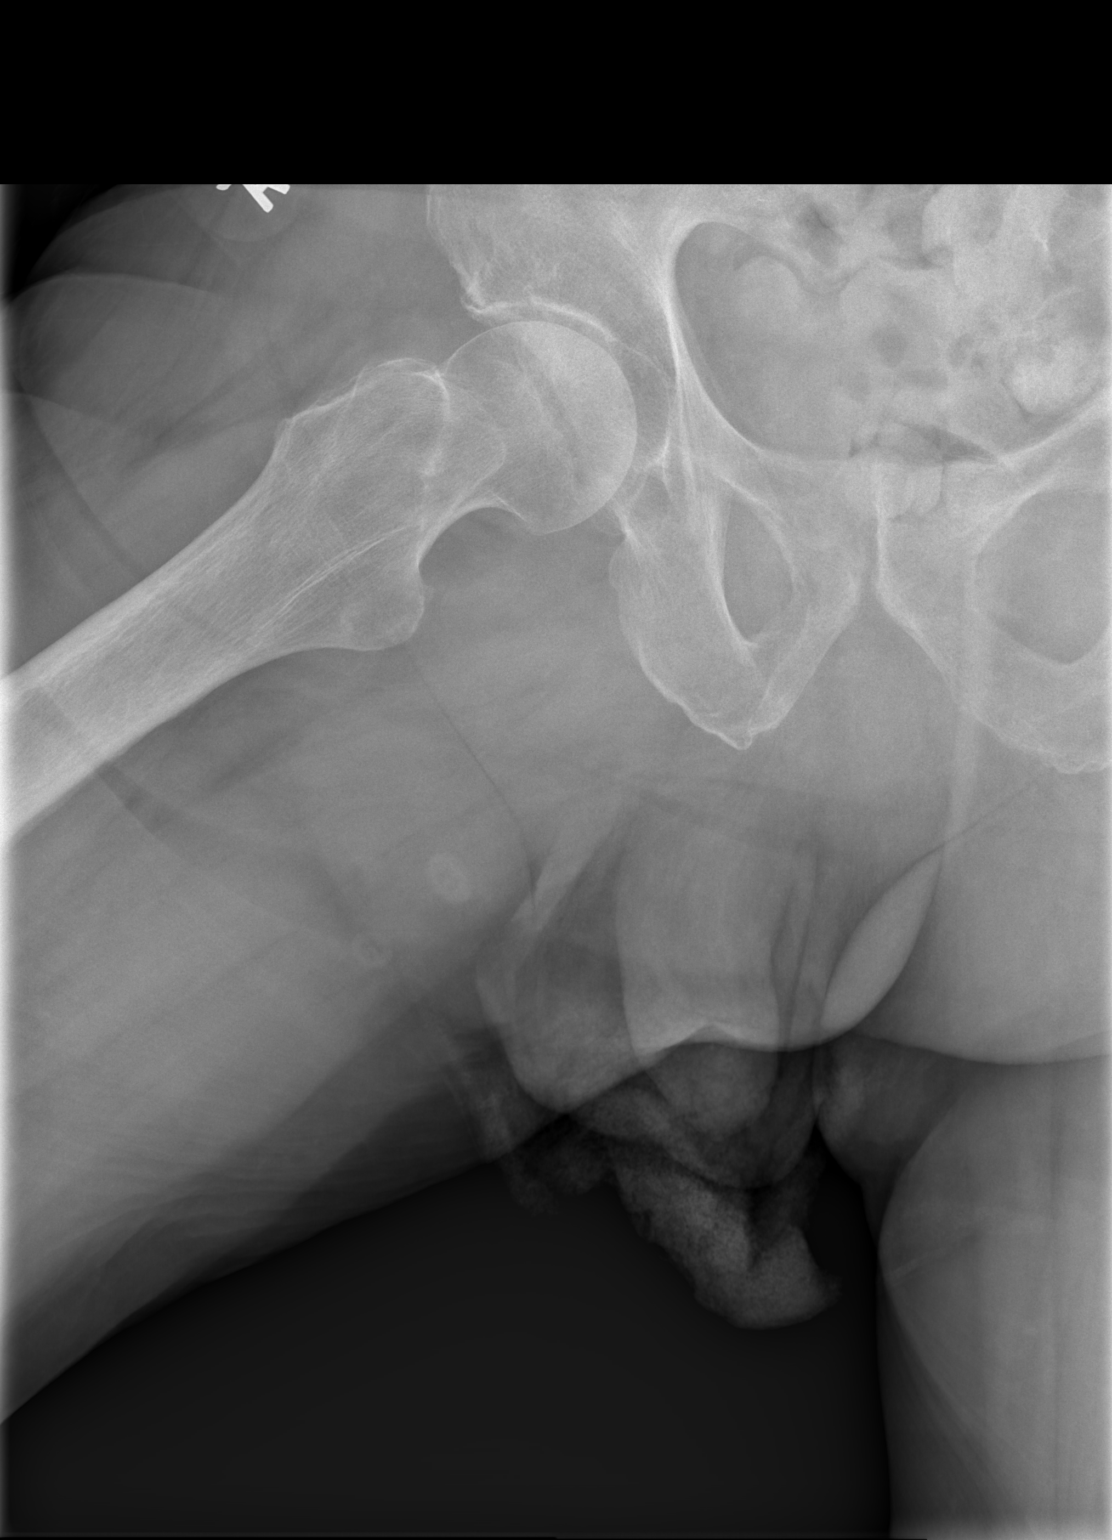

[3 of 3 positions shown; findings below may reference images not displayed]

FINDINGS: No acute fracture is identified. The right hip is located. There is
mild hip joint space narrowing bilaterally, unchanged. Residual oral
contrast is partially visualized in the descending colon. Lumbar
dextroscoliosis is partially visualized.
IMPRESSION: No acute osseous abnormality identified. Mild bilateral hip
osteoarthrosis.

## 2015-09-13 ENCOUNTER — Ambulatory Visit: Payer: Medicare Other | Admitting: Neurology

## 2015-09-14 ENCOUNTER — Other Ambulatory Visit: Payer: Self-pay | Admitting: Neurology

## 2015-09-14 NOTE — Telephone Encounter (Signed)
1.  Will increase Sinemet 25/100mg  to 2 tablets at 8am, 2 tablets at 1pm and 2 tablets at 6pm.  We will change the bedtime dose to Sinemet CR 50/200mg .

## 2015-10-15 ENCOUNTER — Other Ambulatory Visit: Payer: Self-pay | Admitting: Neurology

## 2015-10-25 DIAGNOSIS — G458 Other transient cerebral ischemic attacks and related syndromes: Secondary | ICD-10-CM | POA: Diagnosis not present

## 2015-10-25 DIAGNOSIS — I480 Paroxysmal atrial fibrillation: Secondary | ICD-10-CM | POA: Diagnosis not present

## 2015-10-25 DIAGNOSIS — M810 Age-related osteoporosis without current pathological fracture: Secondary | ICD-10-CM | POA: Diagnosis not present

## 2015-10-25 DIAGNOSIS — I10 Essential (primary) hypertension: Secondary | ICD-10-CM | POA: Diagnosis not present

## 2015-10-25 DIAGNOSIS — J383 Other diseases of vocal cords: Secondary | ICD-10-CM | POA: Diagnosis not present

## 2015-10-25 DIAGNOSIS — Z6829 Body mass index (BMI) 29.0-29.9, adult: Secondary | ICD-10-CM | POA: Diagnosis not present

## 2015-10-25 DIAGNOSIS — Z23 Encounter for immunization: Secondary | ICD-10-CM | POA: Diagnosis not present

## 2015-10-25 DIAGNOSIS — E784 Other hyperlipidemia: Secondary | ICD-10-CM | POA: Diagnosis not present

## 2015-10-25 DIAGNOSIS — G20 Parkinson's disease: Secondary | ICD-10-CM | POA: Diagnosis not present

## 2015-10-25 DIAGNOSIS — I2510 Atherosclerotic heart disease of native coronary artery without angina pectoris: Secondary | ICD-10-CM | POA: Diagnosis not present

## 2015-10-25 DIAGNOSIS — R2689 Other abnormalities of gait and mobility: Secondary | ICD-10-CM | POA: Diagnosis not present

## 2015-10-25 DIAGNOSIS — D6489 Other specified anemias: Secondary | ICD-10-CM | POA: Diagnosis not present

## 2015-11-01 ENCOUNTER — Encounter: Payer: Self-pay | Admitting: Neurology

## 2015-11-01 ENCOUNTER — Ambulatory Visit (INDEPENDENT_AMBULATORY_CARE_PROVIDER_SITE_OTHER): Payer: Medicare Other | Admitting: Neurology

## 2015-11-01 VITALS — BP 122/74 | HR 70 | Ht 67.0 in | Wt 180.0 lb

## 2015-11-01 DIAGNOSIS — G20 Parkinson's disease: Secondary | ICD-10-CM

## 2015-11-01 DIAGNOSIS — G319 Degenerative disease of nervous system, unspecified: Secondary | ICD-10-CM | POA: Diagnosis not present

## 2015-11-01 NOTE — Progress Notes (Signed)
Chart forwarded to Dr. Forde Dandy

## 2015-11-01 NOTE — Patient Instructions (Signed)
Continue current regimen.    Follow-up in 6 months.

## 2015-11-01 NOTE — Progress Notes (Signed)
NEUROLOGY FOLLOW UP OFFICE NOTE  WILKES TAGERT OE:[id number]  HISTORY OF PRESENT ILLNESS: Henry Alvarez is an 82 year old man with history of a-fib (on warfarin), TIA, OA and HTN who presents for follow up regarding idiopathic Parkinson's disease.  He is accompanied by his son who provides some history.     UPDATE: Sinemet 25/100mg  was increased to 2 tablets at 8 am, 2 tablets at 1pm, 2 tablets at 6 pm and Sinemet CR 50/200mg  at bedtime.    He is doing well.  He ambulates with cane comfortably.  No falls.  He feels a little stiff in the morning.  No dysphagia or difficulty eating.  Tremor is a little worse.   HISTORY: Since 2010, he began experiencing tremors in the hand. It occurs mainly in the right hand, although this may be noticeable because he is right-handed. He has difficulty picking up utensils. He doesn't really notice it at rest. He also notes a tremor of his mouth. He said several falls over the last few years. He does have a walker at home, but instead uses a cane regularly. He does note symptoms consistent with freezing when he gets up from a chair. He has had sleep issues in the past, such as frequently waking up at night, but it has been better lately. He doesn't appear to have any history of REM sleep behavior disorder or nightmares. He does note some problems swallowing liquids. His sense of smell is okay. Another problem has been hoarseness. He has had vocal cord surgery to help with his hoarseness.  He denies any symptoms consistent with depression. He does drive without issues.  He often repeats questions to his son.   He had a modified barium swallow on 06/08/14, which revealed moderate sensorimotor pharyngeal dysphagia with delayed swallow initiation, decreased laryngeal elevation and laryngeal closure, resulting in silent aspiration.  He was taught techniques such as chin tuck and superglottic swallow.  Regular diet and tectar thick liquids, chin tuck with liquids, swallow  twice and pills whole with applesauce were recommended.  He was hospitalized in February 2017 for fever and congestion.  CT of head showed no acute findings.  He was discharged on Tamiflu.  Since then, he has had increased tremor of the right hand.  It occurs only at rest and waxes and wanes.  It is usually prominent when he is sitting and preoccupied, such as watching TV.  Sometimes, his right leg will shake too.     He has history of several surgeries, including his back and bilateral knees.   PAST MEDICAL HISTORY: Past Medical History:  Diagnosis Date  . Anemia   . Arthritis   . Atrial fibrillation ([place])   . BPH (benign prostatic hyperplasia)   . C. difficile colitis    hx of cdiff last fall 2016   . Dementia   . Depression   . Hypertension   . Iron deficiency anemia   . Parkinson disease ([place])   . Thrombocytopenia ([place])     MEDICATIONS: Current Outpatient Prescriptions on File Prior to Visit  Medication Sig Dispense Refill  . acetaminophen (TYLENOL) 325 MG tablet Take 2 tablets (650 mg total) by mouth every 4 (four) hours as needed for mild pain, fever or headache. 30 tablet 0  . amLODipine (NORVASC) 2.5 MG tablet Take 2.5 mg by mouth daily.    . carbidopa-levodopa (SINEMET CR) 50-200 MG tablet TAKE ONE TABLET AT BEDTIME. 30 tablet 0  . carbidopa-levodopa (SINEMET) 25-100 MG  tablet Take 2tabs at 8am, 1pm, and 6pm 180 tablet 3  . donepezil (ARICEPT) 10 MG tablet TAKE ONE TABLET AT BEDTIME. 30 tablet 5  . ELIQUIS 2.5 MG TABS tablet Take 2.5 mg by mouth 2 (two) times daily.    Marland Kitchen guaiFENesin (MUCINEX) 600 MG 12 hr tablet Take 600 mg by mouth 2 (two) times daily. Stop date 04/05/15    . hydrocortisone 2.5 % cream Apply 1 application topically daily as needed (irritation/dryness). To face and nose    . ipratropium-albuterol (DUONEB) 0.5-2.5 (3) MG/3ML SOLN Take 3 mLs by nebulization every 6 (six) hours as needed. 360 mL   . iron polysaccharides (NIFEREX) 150 MG capsule Take 150 mg by  mouth daily.    Marland Kitchen ketoconazole (NIZORAL) 2 % cream Apply 1 application topically daily as needed for irritation. Face and nose    . latanoprost (XALATAN) 0.005 % ophthalmic solution Place 1 drop into both eyes at bedtime.     . Melatonin 5 MG CAPS Take 5 mg by mouth at bedtime.    . Multiple Vitamin (MULTIVITAMIN) tablet Take 1 tablet by mouth daily.    Marland Kitchen PARoxetine (PAXIL) 10 MG tablet TAKE 1 TABLET ONCE DAILY. 30 tablet 0  . polyethylene glycol (MIRALAX / GLYCOLAX) packet Take 17 g by mouth daily.    . Saccharomyces boulardii (FLORASTOR PO) Take 1 capsule by mouth daily.     . tamsulosin (FLOMAX) 0.4 MG CAPS capsule Take 0.4 mg by mouth daily.     Marland Kitchen UNABLE TO FIND Take 120 mLs by mouth 2 (two) times daily. Med Name: [facility]     No current facility-administered medications on file prior to visit.     ALLERGIES: Allergies  Allergen Reactions  . Ciprocin-Fluocin-Procin [Fluocinolone]     Past reactions to "quinolones"-just not to have it per Family member.     FAMILY HISTORY: Family History  Problem Relation Age of Onset  . Heart attack Father   . Angina Father   . Heart failure Mother   . Hypertension Sister   . Heart disease      grandmother  . Heart failure Mother   . Colon cancer Neg Hx   . Esophageal cancer Neg Hx   . Kidney disease Neg Hx   . Liver disease Neg Hx     SOCIAL HISTORY: Social History   Social History  . Marital status: Widowed    Spouse name: N/A  . Number of children: 2  . Years of education: N/A   Occupational History  . Retired Engineer, materials business Retired   Social History Main Topics  . Smoking status: Never Smoker  . Smokeless tobacco: Never Used  . Alcohol use No     Comment: 09/05/2012 "rarely; I had a glass of wine ~ 1 month ago"  . Drug use: No  . Sexual activity: No   Other Topics Concern  . Not on file   Social History Narrative  . No narrative on file    REVIEW OF SYSTEMS: Constitutional: No fevers, chills, or sweats, no  generalized fatigue, change in appetite Eyes: No visual changes, double vision, eye pain Ear, nose and throat: No hearing loss, ear pain, nasal congestion, sore throat Cardiovascular: No chest pain, palpitations Respiratory:  No shortness of breath at rest or with exertion, wheezes GastrointestinaI: No nausea, vomiting, diarrhea, abdominal pain, fecal incontinence Genitourinary:  No dysuria, urinary retention or frequency Musculoskeletal:  No neck pain, back pain Integumentary: No rash, pruritus, skin lesions Neurological: as  above Psychiatric: No depression, insomnia, anxiety Endocrine: No palpitations, fatigue, diaphoresis, mood swings, change in appetite, change in weight, increased thirst Hematologic/Lymphatic:  No purpura, petechiae. Allergic/Immunologic: no itchy/runny eyes, nasal congestion, recent allergic reactions, rashes  PHYSICAL EXAM: Vitals:   11/01/15 1428  BP: 122/74  Pulse: 70   General: No acute distress.  Patient appears well-groomed.  normal body habitus. Head:  Normocephalic/atraumatic Eyes:  Fundi examined but not visualized Neck: supple, no paraspinal tenderness, full range of motion Heart:  Regular rate and rhythm Lungs:  Clear to auscultation bilaterally Back: No paraspinal tenderness Neurological Exam:alert and oriented to person, place, and time. Attention span and concentration mildly impaired. Delayed recall poor, remote memory intact, fund of knowledge intact.  Visuospatial and executive functioning impaired.  Speech fluent and not dysarthric, language intact.   Montreal Cognitive Assessment  11/01/2015  Visuospatial/ Executive (0/5) 1  Naming (0/3) 3  Attention: Read list of digits (0/2) 1  Attention: Read list of letters (0/1) 1  Attention: Serial 7 subtraction starting at 100 (0/3) 3  Language: Repeat phrase (0/2) 2  Language : Fluency (0/1) 0  Abstraction (0/2) 1  Delayed Recall (0/5) 0  Orientation (0/6) 6  Total 18  Adjusted Score (based on  education) 18   Hypomimia and hypophonia.  Otherwise, CN II-XII intact. Resting tremor of right hand noted.  Mild postural tremor noted in both hands.  Bradykinesia and cogwheel rigidity..  Bulk normal, muscle strength 5/5 throughout.  Deep tendon reflexes 1+ in upper extremities, otherwise absent.   Finger to nose without dysmetria.  Gait mildly wide-based.  Able to pick up feet when walking. 4 step turn.    IMPRESSION: Parkinson's disease Parkinson's related cognitive impairment  PLAN: 1.  Continue Sinemet 25/100mg  was increased to 2 tablets at 8 am, 2 tablets at 1pm, 2 tablets at 6 pm and Sinemet CR 50/200mg  at bedtime.  2.  Continue Aricept daily 3.  Use cane with ambulation 4.  Follow up in 6 months.  26 minutes spent face to face with patient, over 50% spent counseling.     Metta Clines, DO  CC:  Reynold Bowen, MD

## 2015-11-08 DIAGNOSIS — I480 Paroxysmal atrial fibrillation: Secondary | ICD-10-CM | POA: Diagnosis not present

## 2015-11-08 DIAGNOSIS — H401122 Primary open-angle glaucoma, left eye, moderate stage: Secondary | ICD-10-CM | POA: Diagnosis not present

## 2015-11-08 DIAGNOSIS — Z6829 Body mass index (BMI) 29.0-29.9, adult: Secondary | ICD-10-CM | POA: Diagnosis not present

## 2015-11-08 DIAGNOSIS — H401112 Primary open-angle glaucoma, right eye, moderate stage: Secondary | ICD-10-CM | POA: Diagnosis not present

## 2015-11-11 ENCOUNTER — Other Ambulatory Visit: Payer: Self-pay | Admitting: Neurology

## 2015-11-15 ENCOUNTER — Other Ambulatory Visit: Payer: Self-pay | Admitting: Neurology

## 2015-11-22 ENCOUNTER — Other Ambulatory Visit: Payer: Self-pay | Admitting: Neurology

## 2015-11-29 DIAGNOSIS — L570 Actinic keratosis: Secondary | ICD-10-CM | POA: Diagnosis not present

## 2015-11-29 DIAGNOSIS — C44629 Squamous cell carcinoma of skin of left upper limb, including shoulder: Secondary | ICD-10-CM | POA: Diagnosis not present

## 2015-12-10 ENCOUNTER — Other Ambulatory Visit: Payer: Self-pay | Admitting: Neurology

## 2015-12-15 ENCOUNTER — Other Ambulatory Visit: Payer: Self-pay | Admitting: Neurology

## 2015-12-20 ENCOUNTER — Other Ambulatory Visit: Payer: Self-pay | Admitting: Neurology

## 2016-01-10 ENCOUNTER — Other Ambulatory Visit: Payer: Self-pay | Admitting: Neurology

## 2016-01-13 ENCOUNTER — Other Ambulatory Visit: Payer: Self-pay | Admitting: Neurology

## 2016-01-18 DIAGNOSIS — C44629 Squamous cell carcinoma of skin of left upper limb, including shoulder: Secondary | ICD-10-CM | POA: Diagnosis not present

## 2016-01-18 DIAGNOSIS — L578 Other skin changes due to chronic exposure to nonionizing radiation: Secondary | ICD-10-CM | POA: Diagnosis not present

## 2016-01-18 DIAGNOSIS — L219 Seborrheic dermatitis, unspecified: Secondary | ICD-10-CM | POA: Diagnosis not present

## 2016-01-19 ENCOUNTER — Other Ambulatory Visit: Payer: Self-pay | Admitting: Neurology

## 2016-02-09 ENCOUNTER — Other Ambulatory Visit: Payer: Self-pay | Admitting: Neurology

## 2016-02-28 DIAGNOSIS — L905 Scar conditions and fibrosis of skin: Secondary | ICD-10-CM | POA: Diagnosis not present

## 2016-02-28 DIAGNOSIS — Z85828 Personal history of other malignant neoplasm of skin: Secondary | ICD-10-CM | POA: Diagnosis not present

## 2016-03-01 DIAGNOSIS — J209 Acute bronchitis, unspecified: Secondary | ICD-10-CM | POA: Diagnosis not present

## 2016-03-01 DIAGNOSIS — R05 Cough: Secondary | ICD-10-CM | POA: Diagnosis not present

## 2016-03-06 DIAGNOSIS — G20 Parkinson's disease: Secondary | ICD-10-CM | POA: Diagnosis not present

## 2016-03-06 DIAGNOSIS — F0390 Unspecified dementia without behavioral disturbance: Secondary | ICD-10-CM | POA: Diagnosis not present

## 2016-03-06 DIAGNOSIS — I2510 Atherosclerotic heart disease of native coronary artery without angina pectoris: Secondary | ICD-10-CM | POA: Diagnosis not present

## 2016-03-06 DIAGNOSIS — E784 Other hyperlipidemia: Secondary | ICD-10-CM | POA: Diagnosis not present

## 2016-03-06 DIAGNOSIS — L84 Corns and callosities: Secondary | ICD-10-CM | POA: Diagnosis not present

## 2016-03-06 DIAGNOSIS — Z23 Encounter for immunization: Secondary | ICD-10-CM | POA: Diagnosis not present

## 2016-03-06 DIAGNOSIS — I480 Paroxysmal atrial fibrillation: Secondary | ICD-10-CM | POA: Diagnosis not present

## 2016-03-06 DIAGNOSIS — D649 Anemia, unspecified: Secondary | ICD-10-CM | POA: Diagnosis not present

## 2016-03-06 DIAGNOSIS — D696 Thrombocytopenia, unspecified: Secondary | ICD-10-CM | POA: Diagnosis not present

## 2016-03-06 DIAGNOSIS — Z6829 Body mass index (BMI) 29.0-29.9, adult: Secondary | ICD-10-CM | POA: Diagnosis not present

## 2016-03-06 DIAGNOSIS — Z1389 Encounter for screening for other disorder: Secondary | ICD-10-CM | POA: Diagnosis not present

## 2016-03-06 DIAGNOSIS — I10 Essential (primary) hypertension: Secondary | ICD-10-CM | POA: Diagnosis not present

## 2016-03-09 ENCOUNTER — Other Ambulatory Visit: Payer: Self-pay | Admitting: Neurology

## 2016-03-09 NOTE — Telephone Encounter (Signed)
Rx sent 

## 2016-05-01 ENCOUNTER — Ambulatory Visit (INDEPENDENT_AMBULATORY_CARE_PROVIDER_SITE_OTHER): Payer: Medicare Other | Admitting: Neurology

## 2016-05-01 ENCOUNTER — Encounter: Payer: Self-pay | Admitting: Neurology

## 2016-05-01 VITALS — BP 124/76 | HR 80 | Ht 67.0 in | Wt 182.8 lb

## 2016-05-01 DIAGNOSIS — G20 Parkinson's disease: Secondary | ICD-10-CM | POA: Diagnosis not present

## 2016-05-01 DIAGNOSIS — G319 Degenerative disease of nervous system, unspecified: Secondary | ICD-10-CM

## 2016-05-01 IMAGING — CT CT HEAD W/O CM
1 series · 16 of 30 positions shown, 20 images · non-contrast
Comparison: 03/16/2012 CT.

CLINICAL DATA: Left hip pain with inability to walk. History of
Parkinson's disease, coronary artery disease, atrial fibrillation
and encephalopathy.

EXAM:
CT HEAD WITHOUT CONTRAST
TECHNIQUE: Contiguous axial images were obtained from the base of the skull
through the vertex without intravenous contrast.

[Series 2: headseq 4.8 h45s · axial · 0.45mm/px · z∈[+1132,+1267]mm · 16 of 33 slices shown, 20 images]
[im 2/33  brain]
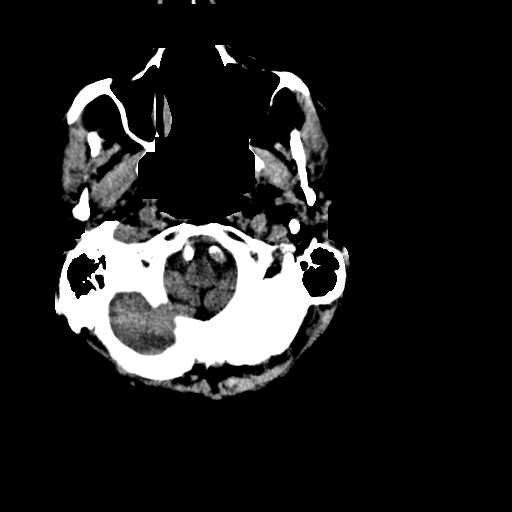
[im 2/33  bone]
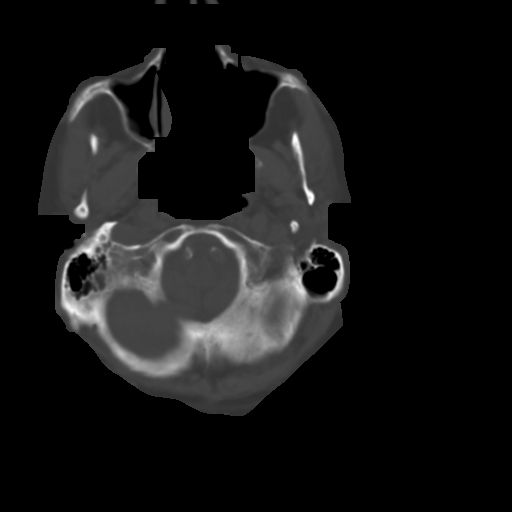
[im 4/33  brain]
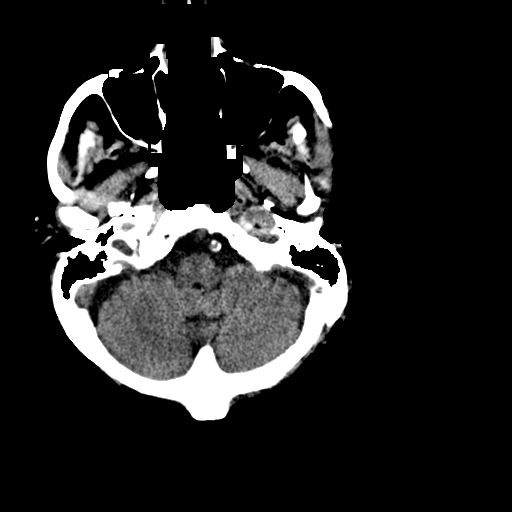
[im 6/33  brain]
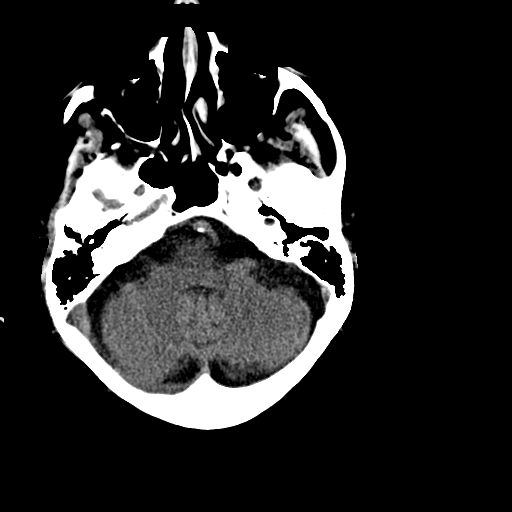
[im 8/33  brain]
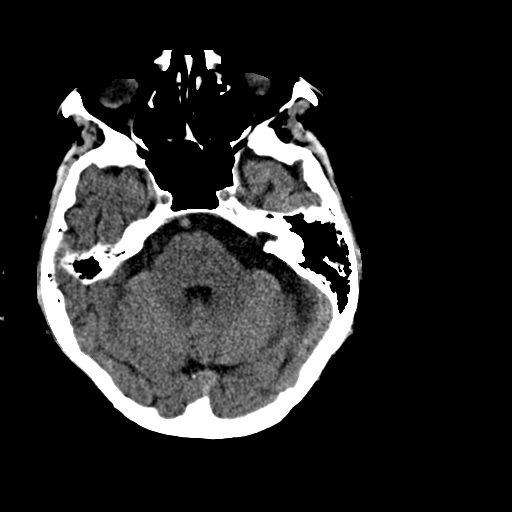
[im 9/33  brain]
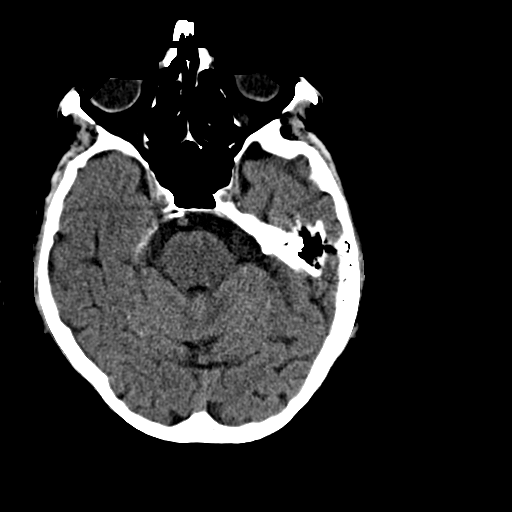
[im 9/33  bone]
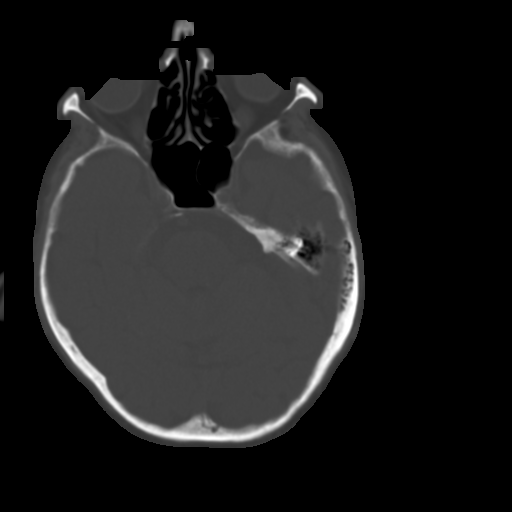
[im 12/33  brain]
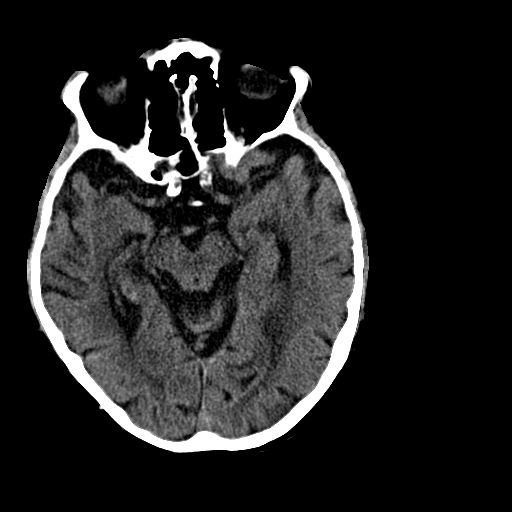
[im 14/33  brain]
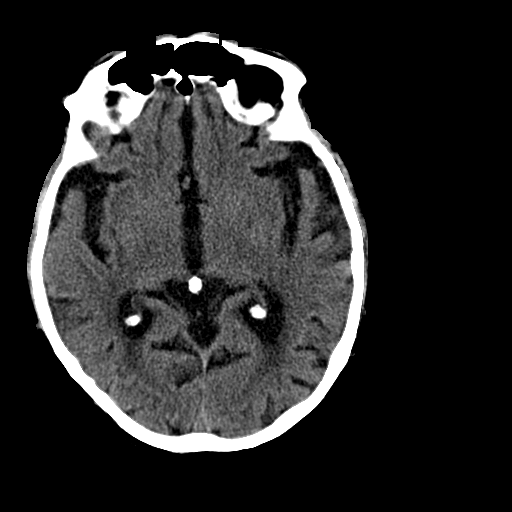
[im 16/33  brain]
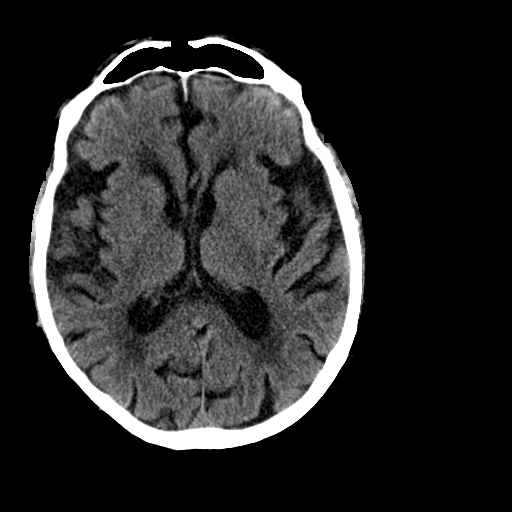
[im 17/33  brain]
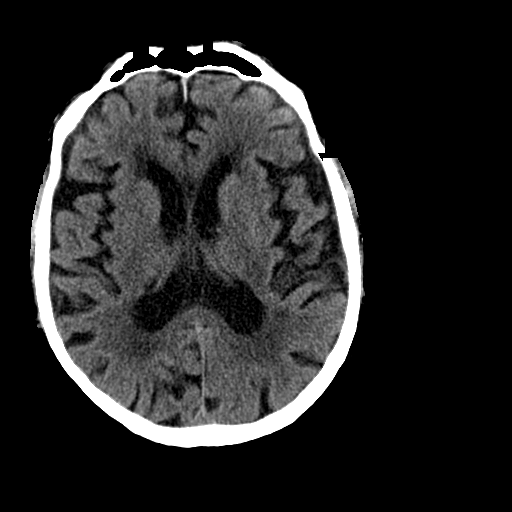
[im 17/33  bone]
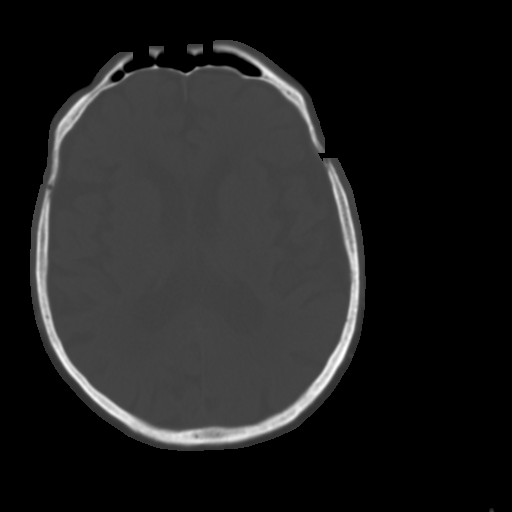
[im 19/33  brain]
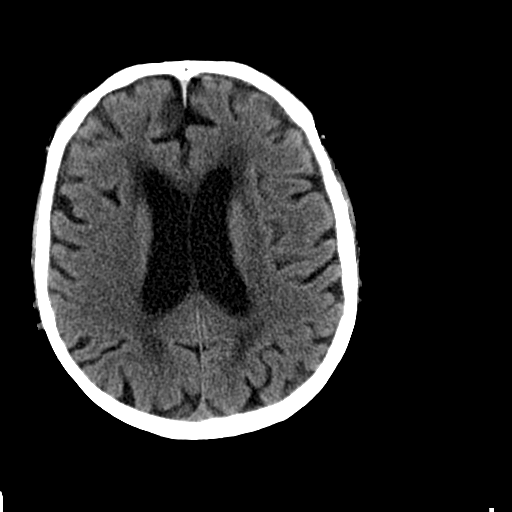
[im 21/33  brain]
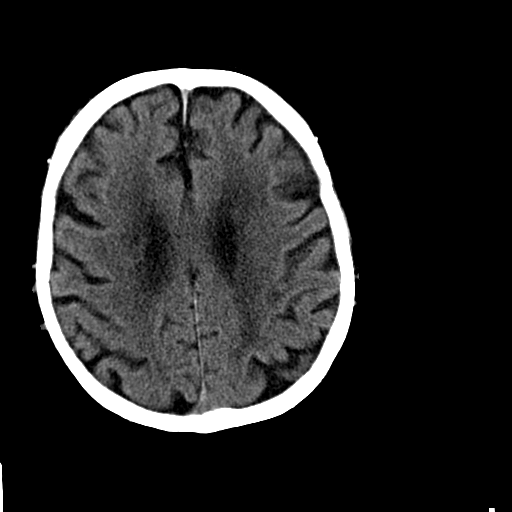
[im 24/33  brain]
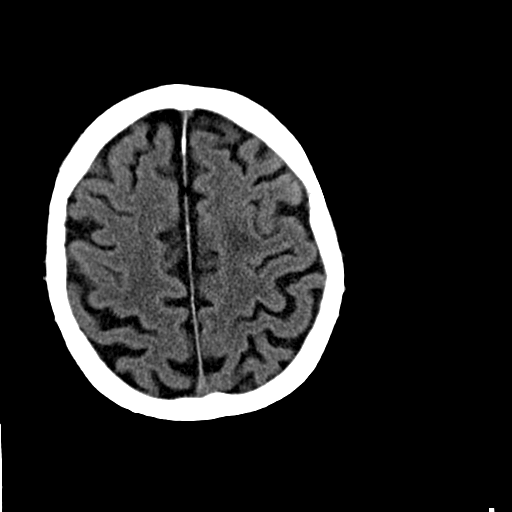
[im 25/33  brain]
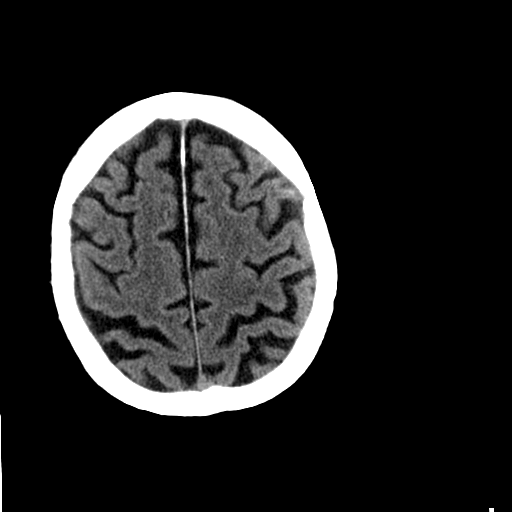
[im 25/33  bone]
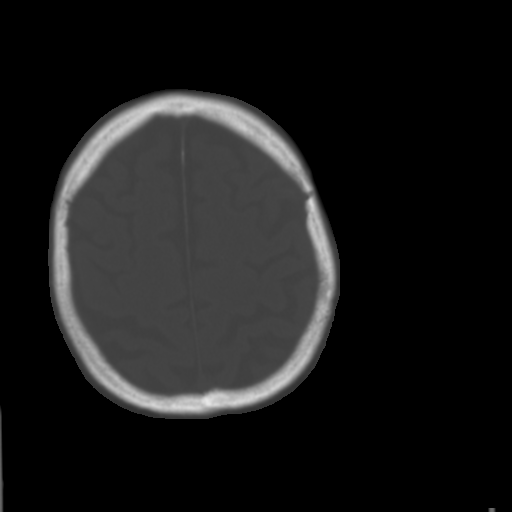
[im 27/33  brain]
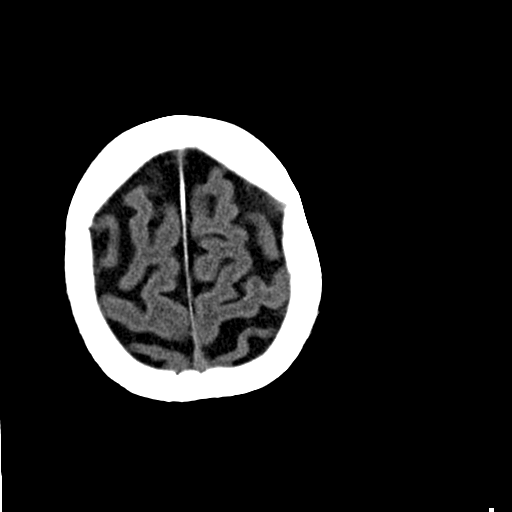
[im 29/33  brain]
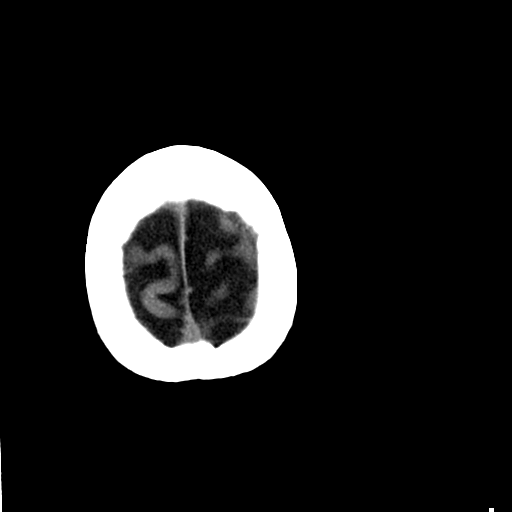
[im 31/33  brain]
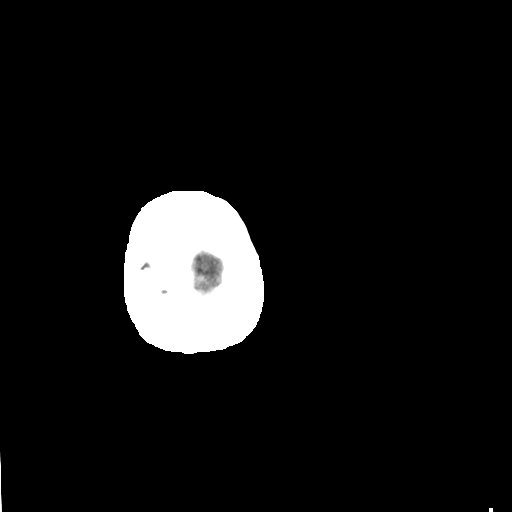

[16 of 30 positions shown; findings below may reference images not displayed]

FINDINGS: There is no evidence of acute intracranial hemorrhage, mass lesion,
brain edema or extra-axial fluid collection. The ventricles and
subarachnoid spaces are diffusely prominent but stable. There is no
CT evidence of acute cortical infarction. Chronic small vessel
ischemic changes are present within the periventricular and
subcortical white matter. There are diffuse intracranial vascular
calcifications.

Mild right maxillary sinus mucosal thickening is present. The
visualized paranasal sinuses, mastoid air cells and middle ears are
otherwise clear. The calvarium is intact.
IMPRESSION: No acute intracranial findings. Stable atrophy and chronic small
vessel ischemic changes.

## 2016-05-01 NOTE — Progress Notes (Signed)
NEUROLOGY FOLLOW UP OFFICE NOTE  Henry Alvarez [id number]  HISTORY OF PRESENT ILLNESS: Henry Alvarez is an 83 year old man with history of a-fib (on warfarin), TIA, OA and HTN who presents for follow up regarding idiopathic Parkinson's disease.  He is accompanied by his son who provides some history.     UPDATE: Sinemet 25/100mg  was increased to 2 tablets at 8 am, 2 tablets at 1pm, 2 tablets at 6 pm and Sinemet CR 50/200mg  at bedtime.    He has not had any falls.  He feels slightly more unsteady when he walks and stands.  He usually uses his cane.  He rarely drives, but only locally.  He has a caregiver.  He feels the tremor in the right hand is slightly worse and makes buttoning his shirt more difficult but does not interfere with eating.  He missed his afternoon dose of Sinemet today.  His son mentions that short term memory is a little worse.  He sometimes has vivid dreams on Aricept but not disruptive enough that would warrant discontinuation.   HISTORY: Since 2010, he began experiencing tremors in the hand. It occurs mainly in the right hand, although this may be noticeable because he is right-handed. He has difficulty picking up utensils. He doesn't really notice it at rest. He also notes a tremor of his mouth. He said several falls over the last few years. He does have a walker at home, but instead uses a cane regularly. He does note symptoms consistent with freezing when he gets up from a chair. He has had sleep issues in the past, such as frequently waking up at night, but it has been better lately. He doesn't appear to have any history of REM sleep behavior disorder or nightmares. He does note some problems swallowing liquids. His sense of smell is okay. Another problem has been hoarseness. He has had vocal cord surgery to help with his hoarseness.  He denies any symptoms consistent with depression. He does drive without issues.  He often repeats questions to his son.   He had a  modified barium swallow on 06/08/14, which revealed moderate sensorimotor pharyngeal dysphagia with delayed swallow initiation, decreased laryngeal elevation and laryngeal closure, resulting in silent aspiration.  He was taught techniques such as chin tuck and superglottic swallow.  Regular diet and tectar thick liquids, chin tuck with liquids, swallow twice and pills whole with applesauce were recommended.   He was hospitalized in February 2017 for fever and congestion.  CT of head showed no acute findings.  He was discharged on Tamiflu.  Since then, he has had increased tremor of the right hand.  It occurs only at rest and waxes and wanes.  It is usually prominent when he is sitting and preoccupied, such as watching TV.  Sometimes, his right leg will shake too.     He has history of several surgeries, including his back and bilateral knees.   PAST MEDICAL HISTORY: Past Medical History:  Diagnosis Date  . Anemia   . Arthritis   . Atrial fibrillation ([place])   . BPH (benign prostatic hyperplasia)   . C. difficile colitis    hx of cdiff last fall 2016   . Dementia   . Depression   . Hypertension   . Iron deficiency anemia   . Parkinson disease ([place])   . Thrombocytopenia ([place])     MEDICATIONS: Current Outpatient Prescriptions on File Prior to Visit  Medication Sig Dispense Refill  . acetaminophen (  TYLENOL) 325 MG tablet Take 2 tablets (650 mg total) by mouth every 4 (four) hours as needed for mild pain, fever or headache. 30 tablet 0  . amLODipine (NORVASC) 2.5 MG tablet Take 2.5 mg by mouth daily.    . carbidopa-levodopa (SINEMET CR) 50-200 MG tablet TAKE ONE TABLET AT BEDTIME. 30 tablet 5  . carbidopa-levodopa (SINEMET IR) 25-100 MG tablet TAKE 2 TABS AT 8AM,1PM,AND 6PM 180 tablet 3  . donepezil (ARICEPT) 10 MG tablet TAKE ONE TABLET AT BEDTIME. 30 tablet 5  . guaiFENesin (MUCINEX) 600 MG 12 hr tablet Take 600 mg by mouth 2 (two) times daily. Stop date 04/05/15    . hydrocortisone 2.5 %  cream Apply 1 application topically daily as needed (irritation/dryness). To face and nose    . ipratropium-albuterol (DUONEB) 0.5-2.5 (3) MG/3ML SOLN Take 3 mLs by nebulization every 6 (six) hours as needed. 360 mL   . iron polysaccharides (NIFEREX) 150 MG capsule Take 150 mg by mouth daily.    Marland Kitchen ketoconazole (NIZORAL) 2 % cream Apply 1 application topically daily as needed for irritation. Face and nose    . latanoprost (XALATAN) 0.005 % ophthalmic solution Place 1 drop into both eyes at bedtime.     . Melatonin 5 MG CAPS Take 5 mg by mouth at bedtime.    . Multiple Vitamin (MULTIVITAMIN) tablet Take 1 tablet by mouth daily.    Marland Kitchen PARoxetine (PAXIL) 10 MG tablet TAKE 1 TABLET ONCE DAILY. 30 tablet 0  . polyethylene glycol (MIRALAX / GLYCOLAX) packet Take 17 g by mouth daily.    . Saccharomyces boulardii (FLORASTOR PO) Take 1 capsule by mouth daily.     . tamsulosin (FLOMAX) 0.4 MG CAPS capsule Take 0.4 mg by mouth daily.     Marland Kitchen UNABLE TO FIND Take 120 mLs by mouth 2 (two) times daily. Med Name: [facility]    . ELIQUIS 2.5 MG TABS tablet Take 2.5 mg by mouth 2 (two) times daily.     No current facility-administered medications on file prior to visit.     ALLERGIES: Allergies  Allergen Reactions  . Ciprocin-Fluocin-Procin [Fluocinolone]     Past reactions to "quinolones"-just not to have it per Family member.     FAMILY HISTORY: Family History  Problem Relation Age of Onset  . Heart attack Father   . Angina Father   . Heart failure Mother   . Hypertension Sister   . Heart disease      grandmother  . Heart failure Mother   . Colon cancer Neg Hx   . Esophageal cancer Neg Hx   . Kidney disease Neg Hx   . Liver disease Neg Hx     SOCIAL HISTORY: Social History   Social History  . Marital status: Widowed    Spouse name: N/A  . Number of children: 2  . Years of education: N/A   Occupational History  . Retired Engineer, materials business Retired   Social History Main Topics  . Smoking  status: Never Smoker  . Smokeless tobacco: Never Used  . Alcohol use No     Comment: 09/05/2012 "rarely; I had a glass of wine ~ 1 month ago"  . Drug use: No  . Sexual activity: No   Other Topics Concern  . Not on file   Social History Narrative  . No narrative on file    REVIEW OF SYSTEMS: Constitutional: No fevers, chills, or sweats, no generalized fatigue, change in appetite Eyes: No visual changes, double vision,  eye pain Ear, nose and throat: No hearing loss, ear pain, nasal congestion, sore throat Cardiovascular: No chest pain, palpitations Respiratory:  No shortness of breath at rest or with exertion, wheezes GastrointestinaI: No nausea, vomiting, diarrhea, abdominal pain, fecal incontinence Genitourinary:  No dysuria, urinary retention or frequency Musculoskeletal:  No neck pain, back pain Integumentary: No rash, pruritus, skin lesions Neurological: as above Psychiatric: No depression, insomnia, anxiety Endocrine: No palpitations, fatigue, diaphoresis, mood swings, change in appetite, change in weight, increased thirst Hematologic/Lymphatic:  No purpura, petechiae. Allergic/Immunologic: no itchy/runny eyes, nasal congestion, recent allergic reactions, rashes  PHYSICAL EXAM: Vitals:   05/01/16 1409  BP: 124/76  Pulse: 80   General: No acute distress.  Patient appears well-groomed.   Head:  Normocephalic/atraumatic Eyes:  Fundi examined but not visualized Neck: supple, no paraspinal tenderness, full range of motion Heart:  Regular rate and rhythm Lungs:  Clear to auscultation bilaterally Back: No paraspinal tenderness Neurological Exam: alert and oriented to person, place, and time. Attention span and concentration mildly impaired. Delayed recall poor, remote memory intact, fund of knowledge intact.  Visuospatial and executive functioning impaired.  Speech fluent and not dysarthric, language intact.   Hypomimia and hypophonia.  Otherwise, CN II-XII intact. Resting  tremor of right hand noted.  Mild postural tremor noted in both hands.  Bradykinesia and cogwheel rigidity..  Bulk normal, muscle strength 5/5 throughout.  Deep tendon reflexes 1+ in upper extremities, otherwise absent.   Finger to nose without dysmetria.  Gait mildly wide-based.  Able to pick up feet when walking. 3-4 step turn.    IMPRESSION: Parkinson's disease Parkinson's related cognitive impairment  PLAN: 1.  Continue Sinemet 2 tablets at 8 am, 2 tablets at 1 pm, 2 tablets at 6 pm and Sinemet CR at bedtime. 2.  Continue donepezil 10mg  at bedtime 3.  Start performing home exercises again.  If balance is still an issue, contact me and I can order you more physical therapy 4.  Do not drive. 5.  Follow up in 6 months.  25 minutes spent face to face with patient, over 50% spent discussing recommendations.Metta Clines, DO  CC:  Reynold Bowen, MD

## 2016-05-01 NOTE — Patient Instructions (Signed)
1.  Continue Sinemet 2 tablets at 8 am, 2 tablets at 1 pm, 2 tablets at 6 pm and Sinemet CR at bedtime. 2.  Continue donepezil 10mg  at bedtime 3.  Start performing home exercises again.  If balance is still an issue, contact me and I can order you more physical therapy 4.  Do not drive. 5.  Follow up in 6 months.

## 2016-05-29 DIAGNOSIS — L905 Scar conditions and fibrosis of skin: Secondary | ICD-10-CM | POA: Diagnosis not present

## 2016-05-29 DIAGNOSIS — Z85828 Personal history of other malignant neoplasm of skin: Secondary | ICD-10-CM | POA: Diagnosis not present

## 2016-05-29 DIAGNOSIS — L570 Actinic keratosis: Secondary | ICD-10-CM | POA: Diagnosis not present

## 2016-06-05 DIAGNOSIS — H401132 Primary open-angle glaucoma, bilateral, moderate stage: Secondary | ICD-10-CM | POA: Diagnosis not present

## 2016-06-05 DIAGNOSIS — H524 Presbyopia: Secondary | ICD-10-CM | POA: Diagnosis not present

## 2016-07-10 ENCOUNTER — Other Ambulatory Visit: Payer: Self-pay | Admitting: Neurology

## 2016-07-13 ENCOUNTER — Other Ambulatory Visit: Payer: Self-pay | Admitting: Neurology

## 2016-07-18 ENCOUNTER — Other Ambulatory Visit: Payer: Self-pay | Admitting: Neurology

## 2016-08-02 DIAGNOSIS — D649 Anemia, unspecified: Secondary | ICD-10-CM | POA: Diagnosis not present

## 2016-08-02 DIAGNOSIS — D696 Thrombocytopenia, unspecified: Secondary | ICD-10-CM | POA: Diagnosis not present

## 2016-08-02 DIAGNOSIS — Z1389 Encounter for screening for other disorder: Secondary | ICD-10-CM | POA: Diagnosis not present

## 2016-08-02 DIAGNOSIS — I480 Paroxysmal atrial fibrillation: Secondary | ICD-10-CM | POA: Diagnosis not present

## 2016-08-02 DIAGNOSIS — F0390 Unspecified dementia without behavioral disturbance: Secondary | ICD-10-CM | POA: Diagnosis not present

## 2016-08-02 DIAGNOSIS — M810 Age-related osteoporosis without current pathological fracture: Secondary | ICD-10-CM | POA: Diagnosis not present

## 2016-08-02 DIAGNOSIS — R740 Nonspecific elevation of levels of transaminase and lactic acid dehydrogenase [LDH]: Secondary | ICD-10-CM | POA: Diagnosis not present

## 2016-08-02 DIAGNOSIS — Z6829 Body mass index (BMI) 29.0-29.9, adult: Secondary | ICD-10-CM | POA: Diagnosis not present

## 2016-08-02 DIAGNOSIS — I10 Essential (primary) hypertension: Secondary | ICD-10-CM | POA: Diagnosis not present

## 2016-08-02 DIAGNOSIS — G20 Parkinson's disease: Secondary | ICD-10-CM | POA: Diagnosis not present

## 2016-08-02 DIAGNOSIS — I2510 Atherosclerotic heart disease of native coronary artery without angina pectoris: Secondary | ICD-10-CM | POA: Diagnosis not present

## 2016-08-02 DIAGNOSIS — G459 Transient cerebral ischemic attack, unspecified: Secondary | ICD-10-CM | POA: Diagnosis not present

## 2016-08-02 DIAGNOSIS — E784 Other hyperlipidemia: Secondary | ICD-10-CM | POA: Diagnosis not present

## 2016-08-10 ENCOUNTER — Other Ambulatory Visit: Payer: Self-pay | Admitting: Neurology

## 2016-09-08 ENCOUNTER — Other Ambulatory Visit: Payer: Self-pay | Admitting: Neurology

## 2016-09-14 ENCOUNTER — Encounter: Payer: Self-pay | Admitting: Family Medicine

## 2016-09-14 ENCOUNTER — Ambulatory Visit (INDEPENDENT_AMBULATORY_CARE_PROVIDER_SITE_OTHER): Payer: Medicare Other | Admitting: Family Medicine

## 2016-09-14 VITALS — BP 116/75 | HR 84 | Temp 97.7°F | Resp 16 | Ht 65.0 in | Wt 180.0 lb

## 2016-09-14 DIAGNOSIS — Z7901 Long term (current) use of anticoagulants: Secondary | ICD-10-CM | POA: Diagnosis not present

## 2016-09-14 DIAGNOSIS — S61209A Unspecified open wound of unspecified finger without damage to nail, initial encounter: Secondary | ICD-10-CM | POA: Diagnosis not present

## 2016-09-14 DIAGNOSIS — Z23 Encounter for immunization: Secondary | ICD-10-CM

## 2016-09-14 NOTE — Patient Instructions (Signed)
Dermabond was used to try and seal the cut and the bleeding. If it resumes bleeding, put a tight dressing on it and get rechecked here or at the emergency room. It might need to be cauterized with some silver nitrate, but I'm concerned that putting stitches in it will just make it bleed worse.  You will receive a shot of TDAP for tetanus.

## 2016-09-14 NOTE — Progress Notes (Signed)
Patient ID: Henry Alvarez, male    DOB: 09/04/1924  Age: 83 y.o. MRN: [id number]  Chief Complaint  Patient presents with  . cut finger    third right finger, last night on foil box    Subjective:   Patient cut his right fourth finger opening a box of 10 for all that had the serrated edge which cut him. It bled all night and he finally came in today. He is on oral anticoagulant medication Xarelto. He is been on anticoagulant therapy for years since he had stents, has been stable.  Current allergies, medications, problem list, past/family and social histories reviewed.  Objective:  BP 116/75   Pulse 84   Temp 97.7 F (36.5 C) (Oral)   Resp 16   Ht 5\' 5"  (1.651 m)   Wt 180 lb (81.6 kg)   SpO2 95%   BMI 29.95 kg/m   Superficial laceration at right fourth finger just at the edge of the nail. The lateral aspect of the wound has a couple of little pores that her bleeding. It is not deep enough to need sutures. It was sealed with Dermabond, but with difficulty because it persisted in morning to bleed.  Dressing was applied and splint were applied to try to keep him from retraumatized it and starting the bleeding again.  Assessment & Plan:   Assessment: 1. Wound, open, finger, initial encounter   2. Long term current use of anticoagulant therapy       Plan: This took a long time but we finally gotten the bleeding to stop.  No orders of the defined types were placed in this encounter.   Meds ordered this encounter  Medications  . Rivaroxaban (XARELTO) 15 MG TABS tablet    Sig: Take 15 mg by mouth 2 (two) times daily with a meal.         Patient Instructions  Dermabond was used to try and seal the cut and the bleeding. If it resumes bleeding, put a tight dressing on it and get rechecked here or at the emergency room. It might need to be cauterized with some silver nitrate, but I'm concerned that putting stitches in it will just make it bleed worse.  You will receive a  shot of TDAP for tetanus.    No Follow-up on file.   HOPPER,DAVID, MD 09/14/2016

## 2016-10-16 DIAGNOSIS — H401132 Primary open-angle glaucoma, bilateral, moderate stage: Secondary | ICD-10-CM | POA: Diagnosis not present

## 2016-10-23 DIAGNOSIS — L218 Other seborrheic dermatitis: Secondary | ICD-10-CM | POA: Diagnosis not present

## 2016-10-23 DIAGNOSIS — L570 Actinic keratosis: Secondary | ICD-10-CM | POA: Diagnosis not present

## 2016-10-30 ENCOUNTER — Encounter: Payer: Self-pay | Admitting: Neurology

## 2016-10-30 ENCOUNTER — Ambulatory Visit (INDEPENDENT_AMBULATORY_CARE_PROVIDER_SITE_OTHER): Payer: Medicare Other | Admitting: Neurology

## 2016-10-30 VITALS — BP 140/92 | HR 86 | Ht 67.0 in | Wt 180.6 lb

## 2016-10-30 DIAGNOSIS — G20 Parkinson's disease: Secondary | ICD-10-CM

## 2016-10-30 DIAGNOSIS — I10 Essential (primary) hypertension: Secondary | ICD-10-CM | POA: Diagnosis not present

## 2016-10-30 MED ORDER — CARBIDOPA-LEVODOPA 25-100 MG PO TABS: ORAL_TABLET | ORAL | 11 refills | Status: DC

## 2016-10-30 MED ORDER — CARBIDOPA-LEVODOPA ER 50-200 MG PO TBCR: 1.0000 | EXTENDED_RELEASE_TABLET | Freq: Every day | ORAL | 11 refills | Status: DC

## 2016-10-30 NOTE — Patient Instructions (Signed)
1.  Continue carbidopa-levodopa 25/100, 2 pills at 8AM, 2 pills at 1PM, 2 pills at [facility] and carbidopa-levodopa timed release at bedtime 2.  No driving 3.  Restart exercises at home 4.  Follow up in 6 months.

## 2016-10-30 NOTE — Progress Notes (Signed)
NEUROLOGY FOLLOW UP OFFICE NOTE  Henry Alvarez [id number]  HISTORY OF PRESENT ILLNESS: Henry Alvarez is an 83 year old man with history of a-fib (on warfarin), TIA, OA and HTN who presents for follow up regarding idiopathic Parkinson's disease.  He is accompanied by his son who provides some history.     UPDATE: He takes carbidopa-levodopa 25/100mg  2 tablets at 8 am, 2 tablets at 1pm, 2 tablets at 6 pm and Sinemet CR 50/200mg  at bedtime.     Overall, he is doing well.  He has been able to ambulate.  He is able to eat and dress.  On rare occasions, he will still go out driving with his caregiver.   HISTORY: Since 2010, he began experiencing tremors in the hand. It occurs mainly in the right hand, although this may be noticeable because he is right-handed. He has difficulty picking up utensils. He doesn't really notice it at rest. He also notes a tremor of his mouth. He said several falls over the last few years. He does have a walker at home, but instead uses a cane regularly. He does note symptoms consistent with freezing when he gets up from a chair. He has had sleep issues in the past, such as frequently waking up at night, but it has been better lately. He doesn't appear to have any history of REM sleep behavior disorder or nightmares. He does note some problems swallowing liquids. His sense of smell is okay. Another problem has been hoarseness. He has had vocal cord surgery to help with his hoarseness.  He denies any symptoms consistent with depression. He does drive without issues.  He often repeats questions to his son.   He had a modified barium swallow on 06/08/14, which revealed moderate sensorimotor pharyngeal dysphagia with delayed swallow initiation, decreased laryngeal elevation and laryngeal closure, resulting in silent aspiration.  He was taught techniques such as chin tuck and superglottic swallow.  Regular diet and tectar thick liquids, chin tuck with liquids, swallow twice and  pills whole with applesauce were recommended.   He was hospitalized in February 2017 for fever and congestion.  CT of head showed no acute findings.  He was discharged on Tamiflu.  Since then, he has had increased tremor of the right hand.  It occurs only at rest and waxes and wanes.  It is usually prominent when he is sitting and preoccupied, such as watching TV.  Sometimes, his right leg will shake too.     He has history of several surgeries, including his back and bilateral knees.  PAST MEDICAL HISTORY: Past Medical History:  Diagnosis Date  . Anemia   . Arthritis   . Atrial fibrillation ([place])   . BPH (benign prostatic hyperplasia)   . C. difficile colitis    hx of cdiff last fall 2016   . Dementia   . Depression   . Hypertension   . Iron deficiency anemia   . Parkinson disease ([place])   . Thrombocytopenia ([place])     MEDICATIONS: Current Outpatient Prescriptions on File Prior to Visit  Medication Sig Dispense Refill  . acetaminophen (TYLENOL) 325 MG tablet Take 2 tablets (650 mg total) by mouth every 4 (four) hours as needed for mild pain, fever or headache. 30 tablet 0  . amLODipine (NORVASC) 2.5 MG tablet Take 2.5 mg by mouth daily.    Marland Kitchen donepezil (ARICEPT) 10 MG tablet TAKE ONE TABLET AT BEDTIME. 30 tablet 3  . ELIQUIS 2.5 MG TABS tablet Take  2.5 mg by mouth 2 (two) times daily.    Marland Kitchen guaiFENesin (MUCINEX) 600 MG 12 hr tablet Take 600 mg by mouth 2 (two) times daily. Stop date 04/05/15    . hydrocortisone 2.5 % cream Apply 1 application topically daily as needed (irritation/dryness). To face and nose    . ipratropium-albuterol (DUONEB) 0.5-2.5 (3) MG/3ML SOLN Take 3 mLs by nebulization every 6 (six) hours as needed. (Patient not taking: Reported on 09/14/2016) 360 mL   . iron polysaccharides (NIFEREX) 150 MG capsule Take 150 mg by mouth daily.    Marland Kitchen ketoconazole (NIZORAL) 2 % cream Apply 1 application topically daily as needed for irritation. Face and nose    . latanoprost (XALATAN)  0.005 % ophthalmic solution Place 1 drop into both eyes at bedtime.     . Melatonin 5 MG CAPS Take 5 mg by mouth at bedtime.    . Multiple Vitamin (MULTIVITAMIN) tablet Take 1 tablet by mouth daily.    Marland Kitchen PARoxetine (PAXIL) 10 MG tablet TAKE 1 TABLET ONCE DAILY. 30 tablet 0  . polyethylene glycol (MIRALAX / GLYCOLAX) packet Take 17 g by mouth daily.    . Rivaroxaban (XARELTO) 15 MG TABS tablet Take 15 mg by mouth 2 (two) times daily with a meal.    . Saccharomyces boulardii (FLORASTOR PO) Take 1 capsule by mouth daily.     . tamsulosin (FLOMAX) 0.4 MG CAPS capsule Take 0.4 mg by mouth daily.     Marland Kitchen UNABLE TO FIND Take 120 mLs by mouth 2 (two) times daily. Med Name: Med[facility]     No current facility-administered medications on file prior to visit.     ALLERGIES: Allergies  Allergen Reactions  . Ciprocin-Fluocin-Procin [Fluocinolone]     Past reactions to "quinolones"-just not to have it per Family member.     FAMILY HISTORY: Family History  Problem Relation Age of Onset  . Heart attack Father   . Angina Father   . Heart failure Mother   . Hypertension Sister   . Heart disease Unknown        grandmother  . Heart failure Mother   . Colon cancer Neg Hx   . Esophageal cancer Neg Hx   . Kidney disease Neg Hx   . Liver disease Neg Hx     SOCIAL HISTORY: Social History   Social History  . Marital status: Widowed    Spouse name: N/A  . Number of children: 2  . Years of education: N/A   Occupational History  . Retired Engineer, materials business Retired   Social History Main Topics  . Smoking status: Never Smoker  . Smokeless tobacco: Never Used  . Alcohol use No     Comment: 09/05/2012 "rarely; I had a glass of wine ~ 1 month ago"  . Drug use: No  . Sexual activity: No   Other Topics Concern  . Not on file   Social History Narrative  . No narrative on file    REVIEW OF SYSTEMS: Constitutional: No fevers, chills, or sweats, no generalized fatigue, change in appetite Eyes: No  visual changes, double vision, eye pain Ear, nose and throat: No hearing loss, ear pain, nasal congestion, sore throat Cardiovascular: No chest pain, palpitations Respiratory:  No shortness of breath at rest or with exertion, wheezes GastrointestinaI: No nausea, vomiting, diarrhea, abdominal pain, fecal incontinence Genitourinary:  No dysuria, urinary retention or frequency Musculoskeletal:  No neck pain, back pain Integumentary: No rash, pruritus, skin lesions Neurological: as above Psychiatric: No  depression, insomnia, anxiety Endocrine: No palpitations, fatigue, diaphoresis, mood swings, change in appetite, change in weight, increased thirst Hematologic/Lymphatic:  No purpura, petechiae. Allergic/Immunologic: no itchy/runny eyes, nasal congestion, recent allergic reactions, rashes  PHYSICAL EXAM: Vitals:   10/30/16 1451  BP: (!) 140/92  Pulse: 86  SpO2: 97%   General: No acute distress.  Patient appears well-groomed.  normal body habitus. Head:  Normocephalic/atraumatic Eyes:  Fundi examined but not visualized Neck: supple, no paraspinal tenderness, full range of motion Heart:  Regular rate and rhythm Lungs:  Clear to auscultation bilaterally Back: No paraspinal tenderness Neurological Exam: alert and oriented to person, place, and time. Attention span and concentration mildly impaired. Delayed recall poor, remote memory intact, fund of knowledge intact.  Visuospatial and executive functioning impaired.  Speech fluent and not dysarthric, language intact.   Hypomimia and hypophonia.  Otherwise, CN II-XII intact. Resting tremor of left hand noted.  Mild postural tremor noted in both hands.  Bradykinesia and cogwheel rigidity..  Bulk normal, muscle strength 5/5 throughout.  Deep tendon reflexes 1+ in upper extremities, otherwise absent.   Finger to nose without dysmetria.  Gait mildly wide-based.  Able to pick up feet when walking. 3-4 step turn.    IMPRESSION: Parkinson's  disease HTN  PLAN: 1.  Continue Sinemet 2 tablets at 8 am, 2 tablets at 1 pm, 2 tablets at 6 pm and Sinemet CR at bedtime. 2.  Continue donepezil 10mg  at bedtime 3.  Discussed that I feel he should not be driving 4.  Contact Dr. Baldwin Crown office regarding elevated blood pressure 5.  Follow up in 6 months.  25 minutes spent face to face with patient, over 50% spent discussing management.  Metta Clines, DO  CC:  Reynold Bowen, MD

## 2016-11-01 DIAGNOSIS — I10 Essential (primary) hypertension: Secondary | ICD-10-CM | POA: Diagnosis not present

## 2016-11-01 DIAGNOSIS — Z6828 Body mass index (BMI) 28.0-28.9, adult: Secondary | ICD-10-CM | POA: Diagnosis not present

## 2016-11-14 NOTE — Progress Notes (Signed)
RN called from [place], was questioning why patient was on Xarelto and Eliquis, advsd we do not Rx either of those meds, advsd her to contact either Dr Ruben Reason who recently saw Pt for laceration and had difficulty stopping the bleeding, or Dr Reynold Bowen that referred Pt to Dr Linna Darner

## 2016-11-16 ENCOUNTER — Other Ambulatory Visit: Payer: Self-pay | Admitting: Neurology

## 2016-12-25 DIAGNOSIS — G459 Transient cerebral ischemic attack, unspecified: Secondary | ICD-10-CM | POA: Diagnosis not present

## 2016-12-25 DIAGNOSIS — R269 Unspecified abnormalities of gait and mobility: Secondary | ICD-10-CM | POA: Diagnosis not present

## 2016-12-25 DIAGNOSIS — I10 Essential (primary) hypertension: Secondary | ICD-10-CM | POA: Diagnosis not present

## 2016-12-25 DIAGNOSIS — K219 Gastro-esophageal reflux disease without esophagitis: Secondary | ICD-10-CM | POA: Diagnosis not present

## 2016-12-25 DIAGNOSIS — G20 Parkinson's disease: Secondary | ICD-10-CM | POA: Diagnosis not present

## 2016-12-25 DIAGNOSIS — F0390 Unspecified dementia without behavioral disturbance: Secondary | ICD-10-CM | POA: Diagnosis not present

## 2016-12-25 DIAGNOSIS — Z23 Encounter for immunization: Secondary | ICD-10-CM | POA: Diagnosis not present

## 2016-12-25 DIAGNOSIS — R740 Nonspecific elevation of levels of transaminase and lactic acid dehydrogenase [LDH]: Secondary | ICD-10-CM | POA: Diagnosis not present

## 2016-12-25 DIAGNOSIS — L84 Corns and callosities: Secondary | ICD-10-CM | POA: Diagnosis not present

## 2016-12-25 DIAGNOSIS — D696 Thrombocytopenia, unspecified: Secondary | ICD-10-CM | POA: Diagnosis not present

## 2016-12-25 DIAGNOSIS — I480 Paroxysmal atrial fibrillation: Secondary | ICD-10-CM | POA: Diagnosis not present

## 2016-12-25 DIAGNOSIS — E7849 Other hyperlipidemia: Secondary | ICD-10-CM | POA: Diagnosis not present

## 2017-01-18 ENCOUNTER — Ambulatory Visit (INDEPENDENT_AMBULATORY_CARE_PROVIDER_SITE_OTHER): Payer: Medicare Other

## 2017-01-18 ENCOUNTER — Other Ambulatory Visit: Payer: Self-pay

## 2017-01-18 ENCOUNTER — Ambulatory Visit (INDEPENDENT_AMBULATORY_CARE_PROVIDER_SITE_OTHER): Payer: Medicare Other | Admitting: Physician Assistant

## 2017-01-18 ENCOUNTER — Encounter: Payer: Self-pay | Admitting: Physician Assistant

## 2017-01-18 VITALS — BP 115/77 | HR 90 | Temp 97.6°F | Resp 16

## 2017-01-18 DIAGNOSIS — R05 Cough: Secondary | ICD-10-CM

## 2017-01-18 DIAGNOSIS — R531 Weakness: Secondary | ICD-10-CM

## 2017-01-18 DIAGNOSIS — R059 Cough, unspecified: Secondary | ICD-10-CM

## 2017-01-18 LAB — POCT CBC
GRANULOCYTE PERCENT: 81.5 % — AB (ref 37–80)
HCT, POC: 39.9 % — AB (ref 43.5–53.7)
HEMOGLOBIN: 12.9 g/dL — AB (ref 14.1–18.1)
Lymph, poc: 1.1 (ref 0.6–3.4)
MCH: 30.7 pg (ref 27–31.2)
MCHC: 32.3 g/dL (ref 31.8–35.4)
MCV: 95.1 fL (ref 80–97)
MID (CBC): 0.5 (ref 0–0.9)
MPV: 8.5 fL (ref 0–99.8)
POC GRANULOCYTE: 6.8 (ref 2–6.9)
POC LYMPH PERCENT: 12.9 %L (ref 10–50)
POC MID %: 5.6 % (ref 0–12)
Platelet Count, POC: 123 10*3/uL — AB (ref 142–424)
RBC: 4.2 M/uL — AB (ref 4.69–6.13)
RDW, POC: 14.8 %
WBC: 8.3 10*3/uL (ref 4.6–10.2)

## 2017-01-18 LAB — POCT URINALYSIS DIP (MANUAL ENTRY)
Blood, UA: NEGATIVE
GLUCOSE UA: NEGATIVE mg/dL
LEUKOCYTES UA: NEGATIVE
Nitrite, UA: NEGATIVE
UROBILINOGEN UA: 1 U/dL
pH, UA: 5.5 (ref 5.0–8.0)

## 2017-01-18 MED ORDER — DOXYCYCLINE HYCLATE 100 MG PO CAPS: 100.0000 mg | ORAL_CAPSULE | Freq: Two times a day (BID) | ORAL | 0 refills | Status: AC

## 2017-01-18 NOTE — Progress Notes (Signed)
01/18/2017 5:13 PM   DOB: [birth year] / MRN: [id number]  SUBJECTIVE:  Henry Alvarez is a 83 y.o. male presenting for non productive cough that started about 3 days ago.  Feels weak and has been having some trouble with ambulation since the cough began.  Tells me he coughed so hard that he fell down and scraped his arm.  He is most worried about the cough and tells me "I don't want to get a flu in my lungs."  He has had the flu shot.  Denies leg swelling.   He is allergic to ciprocin-fluocin-procin [fluocinolone].   He  has a past medical history of Anemia, Arthritis, Atrial fibrillation ([place]), BPH (benign prostatic hyperplasia), C. difficile colitis, Dementia, Depression, Hypertension, Iron deficiency anemia, Parkinson disease ([place]), and Thrombocytopenia ([place]).    He  reports that  has never smoked. he has never used smokeless tobacco. He reports that he does not drink alcohol or use drugs. He  reports that he does not engage in sexual activity. The patient  has a past surgical history that includes Carpal tunnel release (Left); Total knee arthroplasty; Cardiac catheterization (10/30/2005); Coronary angioplasty with stent (11/02/2005); Joint replacement; Kyphosis surgery (09/2010); Cystoscopy (12/21/2010); Total knee arthroplasty (08/07/2011); Laryngoscopy (N/A, 08/26/2012); and Transurethral resection of prostate (12/2010).  His family history includes Angina in his father; Heart attack in his father; Heart disease in his unknown relative; Heart failure in his mother and mother; Hypertension in his sister.  Review of Systems  Constitutional: Negative for chills and fever.  HENT: Positive for congestion, sinus pain and sore throat.   Respiratory: Positive for cough and sputum production. Negative for hemoptysis, shortness of breath and wheezing.   Cardiovascular: Negative for chest pain and leg swelling.  Gastrointestinal: Negative for nausea.  Skin: Negative for itching and rash.    Neurological: Negative for dizziness.    The problem list and medications were reviewed and updated by myself where necessary and exist elsewhere in the encounter.   OBJECTIVE:  BP 115/77 (BP Location: Right Arm, Patient Position: Sitting)   Pulse 90   Temp 97.6 F (36.4 C) (Oral)   Resp 16   SpO2 97%   Wt Readings from Last 3 Encounters:  10/30/16 180 lb 9.6 oz (81.9 kg)  09/14/16 180 lb (81.6 kg)  05/01/16 182 lb 12.8 oz (82.9 kg)   Temp Readings from Last 3 Encounters:  01/18/17 97.6 F (36.4 C) (Oral)  09/14/16 97.7 F (36.5 C) (Oral)  04/01/15 97.4 F (36.3 C) (Oral)   BP Readings from Last 3 Encounters:  01/18/17 115/77  10/30/16 (!) 140/92  09/14/16 116/75   Pulse Readings from Last 3 Encounters:  01/18/17 90  10/30/16 86  09/14/16 84   Wt Readings from Last 3 Encounters:  10/30/16 180 lb 9.6 oz (81.9 kg)  09/14/16 180 lb (81.6 kg)  05/01/16 182 lb 12.8 oz (82.9 kg)    Physical Exam  Constitutional: He is oriented to person, place, and time. He is active and cooperative.  Non-toxic appearance. He does not have a sickly appearance. He appears ill. No distress.  Cardiovascular: Normal rate, regular rhythm and normal heart sounds.  Pulmonary/Chest: Effort normal and breath sounds normal. No tachypnea. No respiratory distress. He has no wheezes. He has no rales. He exhibits no tenderness.  Musculoskeletal: Normal range of motion.  Neurological: He is alert and oriented to person, place, and time. He has normal reflexes. He displays normal reflexes. No cranial nerve deficit. He  exhibits normal muscle tone. Coordination normal.  Skin: Skin is warm and dry. No rash noted. No erythema. There is pallor.  Vitals reviewed.   Results for orders placed or performed in visit on 01/18/17 (from the past 72 hour(s))  POCT CBC     Status: Abnormal   Collection Time: 01/18/17  5:01 PM  Result Value Ref Range   WBC 8.3 4.6 - 10.2 K/uL   Lymph, poc 1.1 0.6 - 3.4   POC  LYMPH PERCENT 12.9 10 - 50 %L   MID (cbc) 0.5 0 - 0.9   POC MID % 5.6 0 - 12 %M   POC Granulocyte 6.8 2 - 6.9   Granulocyte percent 81.5 (A) 37 - 80 %G   RBC 4.20 (A) 4.69 - 6.13 M/uL   Hemoglobin 12.9 (A) 14.1 - 18.1 g/dL   HCT, POC 39.9 (A) 43.5 - 53.7 %   MCV 95.1 80 - 97 fL   MCH, POC 30.7 27 - 31.2 pg   MCHC 32.3 31.8 - 35.4 g/dL   RDW, POC 14.8 %   Platelet Count, POC 123 (A) 142 - 424 K/uL   MPV 8.5 0 - 99.8 fL  POCT urinalysis dipstick     Status: Abnormal   Collection Time: 01/18/17  5:09 PM  Result Value Ref Range   Color, UA yellow yellow   Clarity, UA clear clear   Glucose, UA negative negative mg/dL   Bilirubin, UA small (A) negative   Ketones, POC UA small (15) (A) negative mg/dL   Spec Grav, UA >=1.030 (A) 1.010 - 1.025   Blood, UA negative negative   pH, UA 5.5 5.0 - 8.0   Protein Ur, POC =100 (A) negative mg/dL   Urobilinogen, UA 1.0 0.2 or 1.0 E.U./dL   Nitrite, UA Negative Negative   Leukocytes, UA Negative Negative   CBC Latest Ref Rng & Units 01/18/2017 03/28/2015 03/27/2015  WBC 4.6 - 10.2 K/uL 8.3 6.2 10.2  Hemoglobin 14.1 - 18.1 g/dL 12.9(A) 10.8(L) 11.5(L)  Hematocrit 43.5 - 53.7 % 39.9(A) 33.6(L) 35.8(L)  Platelets 150 - 400 K/uL - 91(L) 103(L)     Dg Chest 2 View  Result Date: 01/18/2017 CLINICAL DATA:  Cough.  History of atrial fibrillation nonsmoker. EXAM: CHEST  2 VIEW COMPARISON:  Chest x-ray of March 26, 2015 FINDINGS: The lungs are adequately inflated. There is no focal infiltrate. The interstitial markings are mildly increased though stable. The heart is top-normal in size. The pulmonary vascularity is not engorged. There is calcification in the wall of the aortic arch. There is mild tortuosity of the descending thoracic aorta. There is prominent thoracic kyphosis. There is partial compression of the bodies of T11 and L2 which have been treated with kyphoplasty. IMPRESSION: Mild chronic bronchitic changes.  No alveolar pneumonia nor CHF.  Thoracic aortic atherosclerosis. Electronically Signed   By: Henry  Alvarez M.D.   On: 01/18/2017 16:36    ASSESSMENT AND PLAN:  Henry Alvarez was seen today for cough, fall and fatigue.  Diagnoses and all orders for this visit:  Cough: Mild granulocytosis.  Could be an early atypical pneumonia or reactive.  Will cover with doxy given low side effect profile.  -     DG Chest 2 View -     POCT urinalysis dipstick  Weakness: Dehydration.  Case discussed with Dr. Carlota Raspberry. Patient refuses to go to the ED for IV NS bolus.  Patient refuses to come back tomorrow for fluids.  Patient refuses to come back  earlier in the week.  Advised that if he continues to feel poorly then please go to the ED for care where he can be monitored.  -     POCT CBC -     Basic Metabolic Panel    The patient is advised to call or return to clinic if he does not see an improvement in symptoms, or to seek the care of the closest emergency department if he worsens with the above plan.   Philis Fendt, MHS, PA-C [facility] 01/18/2017 5:13 PM

## 2017-01-18 NOTE — Patient Instructions (Signed)
Drink lots of fluids tonight.  Start taking doxy to cover for cough.  Take tramadol every 6 hours a prescribed for pain if you have it.

## 2017-01-19 ENCOUNTER — Ambulatory Visit: Payer: Medicare Other | Admitting: Physician Assistant

## 2017-01-19 LAB — BASIC METABOLIC PANEL
BUN / CREAT RATIO: 18 (ref 10–24)
BUN: 24 mg/dL (ref 10–36)
CO2: 23 mmol/L (ref 20–29)
CREATININE: 1.36 mg/dL — AB (ref 0.76–1.27)
Calcium: 9.5 mg/dL (ref 8.6–10.2)
Chloride: 103 mmol/L (ref 96–106)
GFR, EST AFRICAN AMERICAN: 53 mL/min/{1.73_m2} — AB (ref >59)
GFR, EST NON AFRICAN AMERICAN: 45 mL/min/{1.73_m2} — AB (ref >59)
Glucose: 97 mg/dL (ref 65–99)
Potassium: 4.3 mmol/L (ref 3.5–5.2)
SODIUM: 140 mmol/L (ref 134–144)

## 2017-04-23 DIAGNOSIS — D696 Thrombocytopenia, unspecified: Secondary | ICD-10-CM | POA: Diagnosis not present

## 2017-04-23 DIAGNOSIS — Z1389 Encounter for screening for other disorder: Secondary | ICD-10-CM | POA: Diagnosis not present

## 2017-04-23 DIAGNOSIS — Z6829 Body mass index (BMI) 29.0-29.9, adult: Secondary | ICD-10-CM | POA: Diagnosis not present

## 2017-04-23 DIAGNOSIS — N183 Chronic kidney disease, stage 3 (moderate): Secondary | ICD-10-CM | POA: Diagnosis not present

## 2017-04-23 DIAGNOSIS — R2689 Other abnormalities of gait and mobility: Secondary | ICD-10-CM | POA: Diagnosis not present

## 2017-04-23 DIAGNOSIS — I10 Essential (primary) hypertension: Secondary | ICD-10-CM | POA: Diagnosis not present

## 2017-04-23 DIAGNOSIS — I2510 Atherosclerotic heart disease of native coronary artery without angina pectoris: Secondary | ICD-10-CM | POA: Diagnosis not present

## 2017-04-23 DIAGNOSIS — R1310 Dysphagia, unspecified: Secondary | ICD-10-CM | POA: Diagnosis not present

## 2017-04-23 DIAGNOSIS — R740 Nonspecific elevation of levels of transaminase and lactic acid dehydrogenase [LDH]: Secondary | ICD-10-CM | POA: Diagnosis not present

## 2017-04-23 DIAGNOSIS — I480 Paroxysmal atrial fibrillation: Secondary | ICD-10-CM | POA: Diagnosis not present

## 2017-04-23 DIAGNOSIS — Z23 Encounter for immunization: Secondary | ICD-10-CM | POA: Diagnosis not present

## 2017-04-23 DIAGNOSIS — G20 Parkinson's disease: Secondary | ICD-10-CM | POA: Diagnosis not present

## 2017-04-23 DIAGNOSIS — E7849 Other hyperlipidemia: Secondary | ICD-10-CM | POA: Diagnosis not present

## 2017-04-30 DIAGNOSIS — H401132 Primary open-angle glaucoma, bilateral, moderate stage: Secondary | ICD-10-CM | POA: Diagnosis not present

## 2017-05-07 ENCOUNTER — Encounter: Payer: Self-pay | Admitting: Neurology

## 2017-05-07 ENCOUNTER — Ambulatory Visit (INDEPENDENT_AMBULATORY_CARE_PROVIDER_SITE_OTHER): Payer: Medicare Other | Admitting: Neurology

## 2017-05-07 VITALS — BP 118/76 | HR 88 | Resp 16 | Wt 182.2 lb

## 2017-05-07 DIAGNOSIS — G319 Degenerative disease of nervous system, unspecified: Secondary | ICD-10-CM

## 2017-05-07 DIAGNOSIS — I951 Orthostatic hypotension: Secondary | ICD-10-CM

## 2017-05-07 DIAGNOSIS — G20 Parkinson's disease: Secondary | ICD-10-CM | POA: Diagnosis not present

## 2017-05-07 NOTE — Patient Instructions (Addendum)
1.  We will increase the dosing of the caribodopa-levadopa:  Take 2 tablets at 8-8:30 AM, then 2 tablets at 12-12:30 PM, then 2 tablets 4-4:30 PM, then 2 tablets at 8-8:30 PM, then the extended release (Sinimet CR) at bedtime.  Contact me when you need a refill. 2.  Continue donepezil for memory 3.  Take caution when standing up.  Follow up with your PCP regarding blood pressure drop. 4.  Follow up in 4 months.

## 2017-05-07 NOTE — Progress Notes (Signed)
NEUROLOGY FOLLOW UP OFFICE NOTE  Henry Alvarez [id number]  HISTORY OF PRESENT ILLNESS: Henry Alvarez is an 83 year old man with history of a-fib (on warfarin), TIA, OA and HTN who presents for follow up regarding idiopathic Parkinson's disease.  He is accompanied by his son who provides some history.     UPDATE: He takes carbidopa-levodopa 25/100mg  2 tablets at 8 am, 2 tablets at 1pm, 2 tablets at 6 pm and Sinemet CR 50/200mg  at bedtime.     He recently fell a couple of weeks ago.  He lost his balance.  He did not hit his head.  It is rare.  His hands feel stiff.  His voice has become weaker as well.  The other night, he was at a restaurant for over an hour with family.  At the end of the meal, it was difficult for him to stand up.  He sometimes feels lightheaded   HISTORY: Since 2010, he began experiencing tremors in the hand. It occurs mainly in the right hand, although this may be noticeable because he is right-handed. He has difficulty picking up utensils. He doesn't really notice it at rest. He also notes a tremor of his mouth. He said several falls over the last few years. He does have a walker at home, but instead uses a cane regularly. He does note symptoms consistent with freezing when he gets up from a chair. He has had sleep issues in the past, such as frequently waking up at night, but it has been better lately. He doesn't appear to have any history of REM sleep behavior disorder or nightmares. He does note some problems swallowing liquids. His sense of smell is okay. Another problem has been hoarseness. He has had vocal cord surgery to help with his hoarseness.  He denies any symptoms consistent with depression. He does drive without issues.  He often repeats questions to his son.   He had a modified barium swallow on 06/08/14, which revealed moderate sensorimotor pharyngeal dysphagia with delayed swallow initiation, decreased laryngeal elevation and laryngeal closure, resulting in  silent aspiration.  He was taught techniques such as chin tuck and superglottic swallow.  Regular diet and tectar thick liquids, chin tuck with liquids, swallow twice and pills whole with applesauce were recommended.   He was hospitalized in February 2017 for fever and congestion.  CT of head showed no acute findings.  He was discharged on Tamiflu.  Since then, he has had increased tremor of the right hand.  It occurs only at rest and waxes and wanes.  It is usually prominent when he is sitting and preoccupied, such as watching TV.  Sometimes, his right leg will shake too.     He has history of several surgeries, including his back and bilateral knees.   PAST MEDICAL HISTORY: Past Medical History:  Diagnosis Date  . Anemia   . Arthritis   . Atrial fibrillation ([place])   . BPH (benign prostatic hyperplasia)   . C. difficile colitis    hx of cdiff last fall 2016   . Dementia   . Depression   . Hypertension   . Iron deficiency anemia   . Parkinson disease ([place])   . Thrombocytopenia ([place])     MEDICATIONS: Current Outpatient Medications on File Prior to Visit  Medication Sig Dispense Refill  . acetaminophen (TYLENOL) 325 MG tablet Take 2 tablets (650 mg total) by mouth every 4 (four) hours as needed for mild pain, fever or headache. [place]  tablet 0  . amLODipine (NORVASC) 2.5 MG tablet Take 2.5 mg by mouth daily.    . benzonatate (TESSALON) 100 MG capsule Take 100 mg by mouth 3 (three) times daily.    . carbidopa-levodopa (SINEMET CR) 50-200 MG tablet Take 1 tablet by mouth at bedtime. 30 tablet 11  . carbidopa-levodopa (SINEMET IR) 25-100 MG tablet TAKE 2 TABS AT 8AM,1PM,AND 6PM 180 tablet 11  . donepezil (ARICEPT) 10 MG tablet TAKE ONE TABLET AT BEDTIME. 30 tablet 5  . ELIQUIS 2.5 MG TABS tablet Take 2.5 mg by mouth 2 (two) times daily.    . fluticasone (FLONASE) 50 MCG/ACT nasal spray Place into both nostrils daily.    Marland Kitchen guaiFENesin (MUCINEX) 600 MG 12 hr tablet Take 600 mg by mouth 2 (two)  times daily. Stop date 04/05/15    . hydrocortisone 2.5 % cream Apply 1 application topically daily as needed (irritation/dryness). To face and nose    . ipratropium-albuterol (DUONEB) 0.5-2.5 (3) MG/3ML SOLN Take 3 mLs by nebulization every 6 (six) hours as needed. 360 mL   . iron polysaccharides (NIFEREX) 150 MG capsule Take 150 mg by mouth daily.    Marland Kitchen ketoconazole (NIZORAL) 2 % cream Apply 1 application topically daily as needed for irritation. Face and nose    . latanoprost (XALATAN) 0.005 % ophthalmic solution Place 1 drop into both eyes at bedtime.     . Melatonin 5 MG CAPS Take 5 mg by mouth at bedtime.    . Multiple Vitamin (MULTIVITAMIN) tablet Take 1 tablet by mouth daily.    Marland Kitchen PARoxetine (PAXIL) 10 MG tablet TAKE 1 TABLET ONCE DAILY. 30 tablet 0  . polyethylene glycol (MIRALAX / GLYCOLAX) packet Take 17 g by mouth daily.    . Rivaroxaban (XARELTO) 15 MG TABS tablet Take 15 mg by mouth 2 (two) times daily with a meal.    . Saccharomyces boulardii (FLORASTOR PO) Take 1 capsule by mouth daily.     . tamsulosin (FLOMAX) 0.4 MG CAPS capsule Take 0.4 mg by mouth daily.     . traMADol-acetaminophen (ULTRACET) 37.5-325 MG tablet Take 1 tablet by mouth every 6 (six) hours as needed (as needed for pain).    Marland Kitchen UNABLE TO FIND Take 120 mLs by mouth 2 (two) times daily. Med Name: [facility]     No current facility-administered medications on file prior to visit.     ALLERGIES: Allergies  Allergen Reactions  . Ciprocin-Fluocin-Procin [Fluocinolone]     Past reactions to "quinolones"-just not to have it per Family member.     FAMILY HISTORY: Family History  Problem Relation Age of Onset  . Heart attack Father   . Angina Father   . Heart failure Mother   . Hypertension Sister   . Heart disease Unknown        grandmother  . Colon cancer Neg Hx   . Esophageal cancer Neg Hx   . Kidney disease Neg Hx   . Liver disease Neg Hx     SOCIAL HISTORY: Social History   Socioeconomic History  .  Marital status: Widowed    Spouse name: Not on file  . Number of children: 2  . Years of education: Not on file  . Highest education level: Not on file  Occupational History  . Occupation: Retired Warehouse manager: RETIRED  Social Needs  . Financial resource strain: Not on file  . Food insecurity:    Worry: Not on file    Inability:  Not on file  . Transportation needs:    Medical: Not on file    Non-medical: Not on file  Tobacco Use  . Smoking status: Never Smoker  . Smokeless tobacco: Never Used  Substance and Sexual Activity  . Alcohol use: No    Alcohol/week: 0.0 oz    Comment: 09/05/2012 "rarely; I had a glass of wine ~ 1 month ago"  . Drug use: No  . Sexual activity: Never  Lifestyle  . Physical activity:    Days per week: Not on file    Minutes per session: Not on file  . Stress: Not on file  Relationships  . Social connections:    Talks on phone: Not on file    Gets together: Not on file    Attends religious service: Not on file    Active member of club or organization: Not on file    Attends meetings of clubs or organizations: Not on file    Relationship status: Not on file  . Intimate partner violence:    Fear of current or ex partner: Not on file    Emotionally abused: Not on file    Physically abused: Not on file    Forced sexual activity: Not on file  Other Topics Concern  . Not on file  Social History Narrative  . Not on file    REVIEW OF SYSTEMS: Constitutional: No fevers, chills, or sweats, no generalized fatigue, change in appetite Eyes: No visual changes, double vision, eye pain Ear, nose and throat: No hearing loss, ear pain, nasal congestion, sore throat Cardiovascular: No chest pain, palpitations Respiratory:  No shortness of breath at rest or with exertion, wheezes GastrointestinaI: No nausea, vomiting, diarrhea, abdominal pain, fecal incontinence Genitourinary:  No dysuria, urinary retention or frequency Musculoskeletal:  No neck  pain, back pain Integumentary: No rash, pruritus, skin lesions Neurological: as above Psychiatric: No depression, insomnia, anxiety Endocrine: No palpitations, fatigue, diaphoresis, mood swings, change in appetite, change in weight, increased thirst Hematologic/Lymphatic:  No purpura, petechiae. Allergic/Immunologic: no itchy/runny eyes, nasal congestion, recent allergic reactions, rashes  PHYSICAL EXAM: Vitals:   05/07/17 1407  BP: 118/76  Pulse: 88  Resp: 16  SpO2: 97%   General: No acute distress.  Patient appears well-groomed.   Head:  Normocephalic/atraumatic Eyes:  Fundi examined but not visualized Neurological Exam: alert and oriented to person, place, and time. Attention span and concentration mildly impaired. Delayed recall poor, remote memory intact, fund of knowledge intact.  Visuospatial and executive functioning impaired.  Speech fluent and not dysarthric, language intact.   Hypomimia and hypophonia.  Otherwise, CN II-XII intact. Resting tremor of left hand noted.  Mild postural tremor noted in both hands.  Bradykinesia and cogwheel rigidity..  Bulk normal, muscle strength 5/5 throughout.  Deep tendon reflexes 1+ in upper extremities, otherwise absent.   Finger to nose without dysmetria.  Shuffling gait.  IMPRESSION: Idiopathic Parkinson's disease  PLAN: 1.  For better coverage, we will increase the dosing of the caribodopa-levadopa:  Take 2 tablets at 8-8:30 AM, then 2 tablets at 12-12:30 PM, then 2 tablets 4-4:30 PM, then 2 tablets at 8-8:30 PM, then the extended release (Sinimet CR) at bedtime.  2.  Continue donepezil for memory 3.  Take caution when standing up.  Follow up with your PCP regarding blood pressure drop. 4.  Follow up in 4 months.  31 minutes spent face to face with patient, over 50% spent discussing management.  Metta Clines, DO  CC:  Dr.  [place]

## 2017-05-10 ENCOUNTER — Telehealth: Payer: Self-pay | Admitting: Neurology

## 2017-05-10 NOTE — Telephone Encounter (Signed)
If patient is at the pharmacy, call can be transferred to refill team.  1.     Which medications need to be refilled? (please list name of each medication and dose if know) Carbidopa Levodopa 4 x a day this particular one/ RX # [id number]  2.     Which pharmacy/location (including street and city if local pharmacy) is medication to be sent to? [facility]  3.     Do they need a 30 or 90 day supply?  Needs Refill

## 2017-05-10 NOTE — Telephone Encounter (Signed)
[place], talked with Ivin Booty, gave her new Sinemet dosing as of 05/07/17. Called Pt, LM on VM advising him.

## 2017-05-11 NOTE — Telephone Encounter (Signed)
Rcvd VM from [place] with the answering service, Pts son Henry Alvarez is calling stating has been waiting on instructions on new Sinemet dosing. Call back [phone]. Called Henry Alvarez, went to his VM. LM advising I had called [facility] and given new Rx to [employer] (pharmacist) and LM on Pt's VM advising had called in Rx and left directions as well.

## 2017-05-14 DIAGNOSIS — Z85828 Personal history of other malignant neoplasm of skin: Secondary | ICD-10-CM | POA: Diagnosis not present

## 2017-05-14 DIAGNOSIS — L218 Other seborrheic dermatitis: Secondary | ICD-10-CM | POA: Diagnosis not present

## 2017-05-14 DIAGNOSIS — L570 Actinic keratosis: Secondary | ICD-10-CM | POA: Diagnosis not present

## 2017-05-16 ENCOUNTER — Other Ambulatory Visit: Payer: Self-pay | Admitting: Neurology

## 2017-05-25 DIAGNOSIS — M25561 Pain in right knee: Secondary | ICD-10-CM | POA: Diagnosis not present

## 2017-05-25 DIAGNOSIS — M25562 Pain in left knee: Secondary | ICD-10-CM | POA: Diagnosis not present

## 2017-05-25 DIAGNOSIS — Z96659 Presence of unspecified artificial knee joint: Secondary | ICD-10-CM | POA: Diagnosis not present

## 2017-05-28 DIAGNOSIS — I951 Orthostatic hypotension: Secondary | ICD-10-CM | POA: Diagnosis not present

## 2017-05-28 DIAGNOSIS — Z6829 Body mass index (BMI) 29.0-29.9, adult: Secondary | ICD-10-CM | POA: Diagnosis not present

## 2017-05-28 DIAGNOSIS — G20 Parkinson's disease: Secondary | ICD-10-CM | POA: Diagnosis not present

## 2017-05-28 DIAGNOSIS — I10 Essential (primary) hypertension: Secondary | ICD-10-CM | POA: Diagnosis not present

## 2017-05-31 ENCOUNTER — Encounter: Payer: Self-pay | Admitting: Neurology

## 2017-06-13 DIAGNOSIS — M5136 Other intervertebral disc degeneration, lumbar region: Secondary | ICD-10-CM | POA: Diagnosis not present

## 2017-06-13 DIAGNOSIS — M170 Bilateral primary osteoarthritis of knee: Secondary | ICD-10-CM | POA: Diagnosis not present

## 2017-06-13 DIAGNOSIS — M6281 Muscle weakness (generalized): Secondary | ICD-10-CM | POA: Diagnosis not present

## 2017-06-14 ENCOUNTER — Other Ambulatory Visit: Payer: Self-pay | Admitting: Neurology

## 2017-06-17 DIAGNOSIS — Z96652 Presence of left artificial knee joint: Secondary | ICD-10-CM | POA: Diagnosis not present

## 2017-06-17 DIAGNOSIS — Z96651 Presence of right artificial knee joint: Secondary | ICD-10-CM | POA: Diagnosis not present

## 2017-06-17 DIAGNOSIS — M5136 Other intervertebral disc degeneration, lumbar region: Secondary | ICD-10-CM | POA: Diagnosis not present

## 2017-06-17 DIAGNOSIS — Z9181 History of falling: Secondary | ICD-10-CM | POA: Diagnosis not present

## 2017-06-17 DIAGNOSIS — Z7901 Long term (current) use of anticoagulants: Secondary | ICD-10-CM | POA: Diagnosis not present

## 2017-06-20 DIAGNOSIS — Z7901 Long term (current) use of anticoagulants: Secondary | ICD-10-CM | POA: Diagnosis not present

## 2017-06-20 DIAGNOSIS — Z96651 Presence of right artificial knee joint: Secondary | ICD-10-CM | POA: Diagnosis not present

## 2017-06-20 DIAGNOSIS — Z9181 History of falling: Secondary | ICD-10-CM | POA: Diagnosis not present

## 2017-06-20 DIAGNOSIS — Z96652 Presence of left artificial knee joint: Secondary | ICD-10-CM | POA: Diagnosis not present

## 2017-06-20 DIAGNOSIS — M5136 Other intervertebral disc degeneration, lumbar region: Secondary | ICD-10-CM | POA: Diagnosis not present

## 2017-06-21 DIAGNOSIS — Z9181 History of falling: Secondary | ICD-10-CM | POA: Diagnosis not present

## 2017-06-21 DIAGNOSIS — Z96651 Presence of right artificial knee joint: Secondary | ICD-10-CM | POA: Diagnosis not present

## 2017-06-21 DIAGNOSIS — M5136 Other intervertebral disc degeneration, lumbar region: Secondary | ICD-10-CM | POA: Diagnosis not present

## 2017-06-21 DIAGNOSIS — Z7901 Long term (current) use of anticoagulants: Secondary | ICD-10-CM | POA: Diagnosis not present

## 2017-06-21 DIAGNOSIS — Z96652 Presence of left artificial knee joint: Secondary | ICD-10-CM | POA: Diagnosis not present

## 2017-06-25 DIAGNOSIS — Z9181 History of falling: Secondary | ICD-10-CM | POA: Diagnosis not present

## 2017-06-25 DIAGNOSIS — Z96651 Presence of right artificial knee joint: Secondary | ICD-10-CM | POA: Diagnosis not present

## 2017-06-25 DIAGNOSIS — M5136 Other intervertebral disc degeneration, lumbar region: Secondary | ICD-10-CM | POA: Diagnosis not present

## 2017-06-25 DIAGNOSIS — Z7901 Long term (current) use of anticoagulants: Secondary | ICD-10-CM | POA: Diagnosis not present

## 2017-06-25 DIAGNOSIS — Z96652 Presence of left artificial knee joint: Secondary | ICD-10-CM | POA: Diagnosis not present

## 2017-06-27 DIAGNOSIS — Z96652 Presence of left artificial knee joint: Secondary | ICD-10-CM | POA: Diagnosis not present

## 2017-06-27 DIAGNOSIS — Z9181 History of falling: Secondary | ICD-10-CM | POA: Diagnosis not present

## 2017-06-27 DIAGNOSIS — Z7901 Long term (current) use of anticoagulants: Secondary | ICD-10-CM | POA: Diagnosis not present

## 2017-06-27 DIAGNOSIS — Z96651 Presence of right artificial knee joint: Secondary | ICD-10-CM | POA: Diagnosis not present

## 2017-06-27 DIAGNOSIS — M5136 Other intervertebral disc degeneration, lumbar region: Secondary | ICD-10-CM | POA: Diagnosis not present

## 2017-06-28 DIAGNOSIS — Z7901 Long term (current) use of anticoagulants: Secondary | ICD-10-CM | POA: Diagnosis not present

## 2017-06-28 DIAGNOSIS — Z96652 Presence of left artificial knee joint: Secondary | ICD-10-CM | POA: Diagnosis not present

## 2017-06-28 DIAGNOSIS — M5136 Other intervertebral disc degeneration, lumbar region: Secondary | ICD-10-CM | POA: Diagnosis not present

## 2017-06-28 DIAGNOSIS — Z96651 Presence of right artificial knee joint: Secondary | ICD-10-CM | POA: Diagnosis not present

## 2017-06-28 DIAGNOSIS — Z9181 History of falling: Secondary | ICD-10-CM | POA: Diagnosis not present

## 2017-07-03 DIAGNOSIS — Z7901 Long term (current) use of anticoagulants: Secondary | ICD-10-CM | POA: Diagnosis not present

## 2017-07-03 DIAGNOSIS — Z9181 History of falling: Secondary | ICD-10-CM | POA: Diagnosis not present

## 2017-07-03 DIAGNOSIS — Z96652 Presence of left artificial knee joint: Secondary | ICD-10-CM | POA: Diagnosis not present

## 2017-07-03 DIAGNOSIS — M5136 Other intervertebral disc degeneration, lumbar region: Secondary | ICD-10-CM | POA: Diagnosis not present

## 2017-07-03 DIAGNOSIS — Z96651 Presence of right artificial knee joint: Secondary | ICD-10-CM | POA: Diagnosis not present

## 2017-07-05 DIAGNOSIS — M5136 Other intervertebral disc degeneration, lumbar region: Secondary | ICD-10-CM | POA: Diagnosis not present

## 2017-07-05 DIAGNOSIS — Z96652 Presence of left artificial knee joint: Secondary | ICD-10-CM | POA: Diagnosis not present

## 2017-07-05 DIAGNOSIS — Z9181 History of falling: Secondary | ICD-10-CM | POA: Diagnosis not present

## 2017-07-05 DIAGNOSIS — Z96651 Presence of right artificial knee joint: Secondary | ICD-10-CM | POA: Diagnosis not present

## 2017-07-05 DIAGNOSIS — Z7901 Long term (current) use of anticoagulants: Secondary | ICD-10-CM | POA: Diagnosis not present

## 2017-07-09 DIAGNOSIS — M545 Low back pain: Secondary | ICD-10-CM | POA: Diagnosis not present

## 2017-07-09 DIAGNOSIS — M5136 Other intervertebral disc degeneration, lumbar region: Secondary | ICD-10-CM | POA: Diagnosis not present

## 2017-07-10 DIAGNOSIS — Z96651 Presence of right artificial knee joint: Secondary | ICD-10-CM | POA: Diagnosis not present

## 2017-07-10 DIAGNOSIS — Z9181 History of falling: Secondary | ICD-10-CM | POA: Diagnosis not present

## 2017-07-10 DIAGNOSIS — Z96652 Presence of left artificial knee joint: Secondary | ICD-10-CM | POA: Diagnosis not present

## 2017-07-10 DIAGNOSIS — Z7901 Long term (current) use of anticoagulants: Secondary | ICD-10-CM | POA: Diagnosis not present

## 2017-07-10 DIAGNOSIS — M5136 Other intervertebral disc degeneration, lumbar region: Secondary | ICD-10-CM | POA: Diagnosis not present

## 2017-07-11 DIAGNOSIS — Z9181 History of falling: Secondary | ICD-10-CM | POA: Diagnosis not present

## 2017-07-11 DIAGNOSIS — M5136 Other intervertebral disc degeneration, lumbar region: Secondary | ICD-10-CM | POA: Diagnosis not present

## 2017-07-11 DIAGNOSIS — Z96652 Presence of left artificial knee joint: Secondary | ICD-10-CM | POA: Diagnosis not present

## 2017-07-11 DIAGNOSIS — Z7901 Long term (current) use of anticoagulants: Secondary | ICD-10-CM | POA: Diagnosis not present

## 2017-07-11 DIAGNOSIS — Z96651 Presence of right artificial knee joint: Secondary | ICD-10-CM | POA: Diagnosis not present

## 2017-07-13 DIAGNOSIS — M5136 Other intervertebral disc degeneration, lumbar region: Secondary | ICD-10-CM | POA: Diagnosis not present

## 2017-07-13 DIAGNOSIS — Z96651 Presence of right artificial knee joint: Secondary | ICD-10-CM | POA: Diagnosis not present

## 2017-07-13 DIAGNOSIS — Z7901 Long term (current) use of anticoagulants: Secondary | ICD-10-CM | POA: Diagnosis not present

## 2017-07-13 DIAGNOSIS — Z96652 Presence of left artificial knee joint: Secondary | ICD-10-CM | POA: Diagnosis not present

## 2017-07-13 DIAGNOSIS — Z9181 History of falling: Secondary | ICD-10-CM | POA: Diagnosis not present

## 2017-07-19 DIAGNOSIS — Z96652 Presence of left artificial knee joint: Secondary | ICD-10-CM | POA: Diagnosis not present

## 2017-07-19 DIAGNOSIS — Z96651 Presence of right artificial knee joint: Secondary | ICD-10-CM | POA: Diagnosis not present

## 2017-07-19 DIAGNOSIS — Z9181 History of falling: Secondary | ICD-10-CM | POA: Diagnosis not present

## 2017-07-19 DIAGNOSIS — M5136 Other intervertebral disc degeneration, lumbar region: Secondary | ICD-10-CM | POA: Diagnosis not present

## 2017-07-19 DIAGNOSIS — Z7901 Long term (current) use of anticoagulants: Secondary | ICD-10-CM | POA: Diagnosis not present

## 2017-07-20 DIAGNOSIS — Z7901 Long term (current) use of anticoagulants: Secondary | ICD-10-CM | POA: Diagnosis not present

## 2017-07-20 DIAGNOSIS — Z96652 Presence of left artificial knee joint: Secondary | ICD-10-CM | POA: Diagnosis not present

## 2017-07-20 DIAGNOSIS — Z9181 History of falling: Secondary | ICD-10-CM | POA: Diagnosis not present

## 2017-07-20 DIAGNOSIS — Z96651 Presence of right artificial knee joint: Secondary | ICD-10-CM | POA: Diagnosis not present

## 2017-07-20 DIAGNOSIS — M5136 Other intervertebral disc degeneration, lumbar region: Secondary | ICD-10-CM | POA: Diagnosis not present

## 2017-07-23 DIAGNOSIS — M5136 Other intervertebral disc degeneration, lumbar region: Secondary | ICD-10-CM | POA: Diagnosis not present

## 2017-07-23 DIAGNOSIS — Z7901 Long term (current) use of anticoagulants: Secondary | ICD-10-CM | POA: Diagnosis not present

## 2017-07-23 DIAGNOSIS — Z9181 History of falling: Secondary | ICD-10-CM | POA: Diagnosis not present

## 2017-07-23 DIAGNOSIS — Z96651 Presence of right artificial knee joint: Secondary | ICD-10-CM | POA: Diagnosis not present

## 2017-07-23 DIAGNOSIS — Z96652 Presence of left artificial knee joint: Secondary | ICD-10-CM | POA: Diagnosis not present

## 2017-07-24 DIAGNOSIS — Z7901 Long term (current) use of anticoagulants: Secondary | ICD-10-CM | POA: Diagnosis not present

## 2017-07-24 DIAGNOSIS — Z9181 History of falling: Secondary | ICD-10-CM | POA: Diagnosis not present

## 2017-07-24 DIAGNOSIS — Z96652 Presence of left artificial knee joint: Secondary | ICD-10-CM | POA: Diagnosis not present

## 2017-07-24 DIAGNOSIS — Z96651 Presence of right artificial knee joint: Secondary | ICD-10-CM | POA: Diagnosis not present

## 2017-07-24 DIAGNOSIS — M5136 Other intervertebral disc degeneration, lumbar region: Secondary | ICD-10-CM | POA: Diagnosis not present

## 2017-07-25 DIAGNOSIS — M5136 Other intervertebral disc degeneration, lumbar region: Secondary | ICD-10-CM | POA: Diagnosis not present

## 2017-07-25 DIAGNOSIS — Z7901 Long term (current) use of anticoagulants: Secondary | ICD-10-CM | POA: Diagnosis not present

## 2017-07-25 DIAGNOSIS — Z9181 History of falling: Secondary | ICD-10-CM | POA: Diagnosis not present

## 2017-07-25 DIAGNOSIS — Z96652 Presence of left artificial knee joint: Secondary | ICD-10-CM | POA: Diagnosis not present

## 2017-07-25 DIAGNOSIS — Z96651 Presence of right artificial knee joint: Secondary | ICD-10-CM | POA: Diagnosis not present

## 2017-07-26 DIAGNOSIS — M5136 Other intervertebral disc degeneration, lumbar region: Secondary | ICD-10-CM | POA: Diagnosis not present

## 2017-07-30 DIAGNOSIS — Z7901 Long term (current) use of anticoagulants: Secondary | ICD-10-CM | POA: Diagnosis not present

## 2017-07-30 DIAGNOSIS — M5136 Other intervertebral disc degeneration, lumbar region: Secondary | ICD-10-CM | POA: Diagnosis not present

## 2017-07-30 DIAGNOSIS — Z9181 History of falling: Secondary | ICD-10-CM | POA: Diagnosis not present

## 2017-07-30 DIAGNOSIS — Z96651 Presence of right artificial knee joint: Secondary | ICD-10-CM | POA: Diagnosis not present

## 2017-07-30 DIAGNOSIS — Z96652 Presence of left artificial knee joint: Secondary | ICD-10-CM | POA: Diagnosis not present

## 2017-07-31 DIAGNOSIS — Z96652 Presence of left artificial knee joint: Secondary | ICD-10-CM | POA: Diagnosis not present

## 2017-07-31 DIAGNOSIS — Z7901 Long term (current) use of anticoagulants: Secondary | ICD-10-CM | POA: Diagnosis not present

## 2017-07-31 DIAGNOSIS — Z96651 Presence of right artificial knee joint: Secondary | ICD-10-CM | POA: Diagnosis not present

## 2017-07-31 DIAGNOSIS — Z9181 History of falling: Secondary | ICD-10-CM | POA: Diagnosis not present

## 2017-07-31 DIAGNOSIS — M5136 Other intervertebral disc degeneration, lumbar region: Secondary | ICD-10-CM | POA: Diagnosis not present

## 2017-08-01 DIAGNOSIS — Z7901 Long term (current) use of anticoagulants: Secondary | ICD-10-CM | POA: Diagnosis not present

## 2017-08-01 DIAGNOSIS — Z9181 History of falling: Secondary | ICD-10-CM | POA: Diagnosis not present

## 2017-08-01 DIAGNOSIS — Z96652 Presence of left artificial knee joint: Secondary | ICD-10-CM | POA: Diagnosis not present

## 2017-08-01 DIAGNOSIS — Z96651 Presence of right artificial knee joint: Secondary | ICD-10-CM | POA: Diagnosis not present

## 2017-08-01 DIAGNOSIS — M5136 Other intervertebral disc degeneration, lumbar region: Secondary | ICD-10-CM | POA: Diagnosis not present

## 2017-08-06 DIAGNOSIS — Z96652 Presence of left artificial knee joint: Secondary | ICD-10-CM | POA: Diagnosis not present

## 2017-08-06 DIAGNOSIS — M5136 Other intervertebral disc degeneration, lumbar region: Secondary | ICD-10-CM | POA: Diagnosis not present

## 2017-08-06 DIAGNOSIS — Z96651 Presence of right artificial knee joint: Secondary | ICD-10-CM | POA: Diagnosis not present

## 2017-08-06 DIAGNOSIS — Z7901 Long term (current) use of anticoagulants: Secondary | ICD-10-CM | POA: Diagnosis not present

## 2017-08-06 DIAGNOSIS — Z9181 History of falling: Secondary | ICD-10-CM | POA: Diagnosis not present

## 2017-08-10 DIAGNOSIS — Z96651 Presence of right artificial knee joint: Secondary | ICD-10-CM | POA: Diagnosis not present

## 2017-08-10 DIAGNOSIS — M5136 Other intervertebral disc degeneration, lumbar region: Secondary | ICD-10-CM | POA: Diagnosis not present

## 2017-08-10 DIAGNOSIS — Z7901 Long term (current) use of anticoagulants: Secondary | ICD-10-CM | POA: Diagnosis not present

## 2017-08-10 DIAGNOSIS — Z9181 History of falling: Secondary | ICD-10-CM | POA: Diagnosis not present

## 2017-08-10 DIAGNOSIS — Z96652 Presence of left artificial knee joint: Secondary | ICD-10-CM | POA: Diagnosis not present

## 2017-08-14 DIAGNOSIS — M5136 Other intervertebral disc degeneration, lumbar region: Secondary | ICD-10-CM | POA: Diagnosis not present

## 2017-08-14 DIAGNOSIS — Z96652 Presence of left artificial knee joint: Secondary | ICD-10-CM | POA: Diagnosis not present

## 2017-08-14 DIAGNOSIS — Z96651 Presence of right artificial knee joint: Secondary | ICD-10-CM | POA: Diagnosis not present

## 2017-08-14 DIAGNOSIS — Z9181 History of falling: Secondary | ICD-10-CM | POA: Diagnosis not present

## 2017-08-14 DIAGNOSIS — Z7901 Long term (current) use of anticoagulants: Secondary | ICD-10-CM | POA: Diagnosis not present

## 2017-08-15 DIAGNOSIS — R0602 Shortness of breath: Secondary | ICD-10-CM | POA: Diagnosis not present

## 2017-08-15 DIAGNOSIS — J189 Pneumonia, unspecified organism: Secondary | ICD-10-CM | POA: Diagnosis not present

## 2017-08-15 DIAGNOSIS — Z6822 Body mass index (BMI) 22.0-22.9, adult: Secondary | ICD-10-CM | POA: Diagnosis not present

## 2017-08-15 DIAGNOSIS — I10 Essential (primary) hypertension: Secondary | ICD-10-CM | POA: Diagnosis not present

## 2017-08-15 DIAGNOSIS — R531 Weakness: Secondary | ICD-10-CM | POA: Diagnosis not present

## 2017-08-15 DIAGNOSIS — R918 Other nonspecific abnormal finding of lung field: Secondary | ICD-10-CM | POA: Diagnosis not present

## 2017-08-16 DIAGNOSIS — I10 Essential (primary) hypertension: Secondary | ICD-10-CM | POA: Diagnosis not present

## 2017-08-16 DIAGNOSIS — M5136 Other intervertebral disc degeneration, lumbar region: Secondary | ICD-10-CM | POA: Diagnosis not present

## 2017-08-16 DIAGNOSIS — Z96651 Presence of right artificial knee joint: Secondary | ICD-10-CM | POA: Diagnosis not present

## 2017-08-16 DIAGNOSIS — Z9181 History of falling: Secondary | ICD-10-CM | POA: Diagnosis not present

## 2017-08-16 DIAGNOSIS — M6281 Muscle weakness (generalized): Secondary | ICD-10-CM | POA: Diagnosis not present

## 2017-08-16 DIAGNOSIS — Z96652 Presence of left artificial knee joint: Secondary | ICD-10-CM | POA: Diagnosis not present

## 2017-08-16 DIAGNOSIS — Z7901 Long term (current) use of anticoagulants: Secondary | ICD-10-CM | POA: Diagnosis not present

## 2017-08-16 DIAGNOSIS — Z85828 Personal history of other malignant neoplasm of skin: Secondary | ICD-10-CM | POA: Diagnosis not present

## 2017-08-17 DIAGNOSIS — M5136 Other intervertebral disc degeneration, lumbar region: Secondary | ICD-10-CM | POA: Diagnosis not present

## 2017-08-17 DIAGNOSIS — Z85828 Personal history of other malignant neoplasm of skin: Secondary | ICD-10-CM | POA: Diagnosis not present

## 2017-08-17 DIAGNOSIS — Z7901 Long term (current) use of anticoagulants: Secondary | ICD-10-CM | POA: Diagnosis not present

## 2017-08-17 DIAGNOSIS — I10 Essential (primary) hypertension: Secondary | ICD-10-CM | POA: Diagnosis not present

## 2017-08-17 DIAGNOSIS — Z96652 Presence of left artificial knee joint: Secondary | ICD-10-CM | POA: Diagnosis not present

## 2017-08-17 DIAGNOSIS — Z96651 Presence of right artificial knee joint: Secondary | ICD-10-CM | POA: Diagnosis not present

## 2017-08-20 DIAGNOSIS — H401133 Primary open-angle glaucoma, bilateral, severe stage: Secondary | ICD-10-CM | POA: Diagnosis not present

## 2017-08-20 DIAGNOSIS — H5201 Hypermetropia, right eye: Secondary | ICD-10-CM | POA: Diagnosis not present

## 2017-08-21 ENCOUNTER — Other Ambulatory Visit: Payer: Self-pay | Admitting: Endocrinology

## 2017-08-21 DIAGNOSIS — R9389 Abnormal findings on diagnostic imaging of other specified body structures: Secondary | ICD-10-CM

## 2017-08-22 DIAGNOSIS — M5136 Other intervertebral disc degeneration, lumbar region: Secondary | ICD-10-CM | POA: Diagnosis not present

## 2017-08-22 DIAGNOSIS — Z96652 Presence of left artificial knee joint: Secondary | ICD-10-CM | POA: Diagnosis not present

## 2017-08-22 DIAGNOSIS — I10 Essential (primary) hypertension: Secondary | ICD-10-CM | POA: Diagnosis not present

## 2017-08-22 DIAGNOSIS — Z85828 Personal history of other malignant neoplasm of skin: Secondary | ICD-10-CM | POA: Diagnosis not present

## 2017-08-22 DIAGNOSIS — Z7901 Long term (current) use of anticoagulants: Secondary | ICD-10-CM | POA: Diagnosis not present

## 2017-08-22 DIAGNOSIS — Z96651 Presence of right artificial knee joint: Secondary | ICD-10-CM | POA: Diagnosis not present

## 2017-08-24 DIAGNOSIS — M5136 Other intervertebral disc degeneration, lumbar region: Secondary | ICD-10-CM | POA: Diagnosis not present

## 2017-08-24 DIAGNOSIS — Z7901 Long term (current) use of anticoagulants: Secondary | ICD-10-CM | POA: Diagnosis not present

## 2017-08-24 DIAGNOSIS — Z85828 Personal history of other malignant neoplasm of skin: Secondary | ICD-10-CM | POA: Diagnosis not present

## 2017-08-24 DIAGNOSIS — I10 Essential (primary) hypertension: Secondary | ICD-10-CM | POA: Diagnosis not present

## 2017-08-24 DIAGNOSIS — Z96651 Presence of right artificial knee joint: Secondary | ICD-10-CM | POA: Diagnosis not present

## 2017-08-24 DIAGNOSIS — Z96652 Presence of left artificial knee joint: Secondary | ICD-10-CM | POA: Diagnosis not present

## 2017-08-27 DIAGNOSIS — R269 Unspecified abnormalities of gait and mobility: Secondary | ICD-10-CM | POA: Diagnosis not present

## 2017-08-27 DIAGNOSIS — D696 Thrombocytopenia, unspecified: Secondary | ICD-10-CM | POA: Diagnosis not present

## 2017-08-27 DIAGNOSIS — I2510 Atherosclerotic heart disease of native coronary artery without angina pectoris: Secondary | ICD-10-CM | POA: Diagnosis not present

## 2017-08-27 DIAGNOSIS — J383 Other diseases of vocal cords: Secondary | ICD-10-CM | POA: Diagnosis not present

## 2017-08-27 DIAGNOSIS — F0390 Unspecified dementia without behavioral disturbance: Secondary | ICD-10-CM | POA: Diagnosis not present

## 2017-08-27 DIAGNOSIS — I951 Orthostatic hypotension: Secondary | ICD-10-CM | POA: Diagnosis not present

## 2017-08-27 DIAGNOSIS — I10 Essential (primary) hypertension: Secondary | ICD-10-CM | POA: Diagnosis not present

## 2017-08-27 DIAGNOSIS — Z6829 Body mass index (BMI) 29.0-29.9, adult: Secondary | ICD-10-CM | POA: Diagnosis not present

## 2017-08-27 DIAGNOSIS — I480 Paroxysmal atrial fibrillation: Secondary | ICD-10-CM | POA: Diagnosis not present

## 2017-08-27 DIAGNOSIS — N183 Chronic kidney disease, stage 3 (moderate): Secondary | ICD-10-CM | POA: Diagnosis not present

## 2017-08-27 DIAGNOSIS — M810 Age-related osteoporosis without current pathological fracture: Secondary | ICD-10-CM | POA: Diagnosis not present

## 2017-08-27 DIAGNOSIS — E7849 Other hyperlipidemia: Secondary | ICD-10-CM | POA: Diagnosis not present

## 2017-08-27 DIAGNOSIS — R740 Nonspecific elevation of levels of transaminase and lactic acid dehydrogenase [LDH]: Secondary | ICD-10-CM | POA: Diagnosis not present

## 2017-08-27 DIAGNOSIS — G20 Parkinson's disease: Secondary | ICD-10-CM | POA: Diagnosis not present

## 2017-08-27 DIAGNOSIS — D6489 Other specified anemias: Secondary | ICD-10-CM | POA: Diagnosis not present

## 2017-08-30 ENCOUNTER — Ambulatory Visit
Admission: RE | Admit: 2017-08-30 | Discharge: 2017-08-30 | Disposition: A | Discharge status: Home / Self Care | Payer: Medicare Other | Source: Ambulatory Visit | Attending: Endocrinology | Admitting: Endocrinology

## 2017-08-30 DIAGNOSIS — R918 Other nonspecific abnormal finding of lung field: Secondary | ICD-10-CM | POA: Diagnosis not present

## 2017-08-30 DIAGNOSIS — R9389 Abnormal findings on diagnostic imaging of other specified body structures: Secondary | ICD-10-CM

## 2017-08-30 MED ORDER — IOPAMIDOL (ISOVUE-300) INJECTION 61%
75.0000 mL | Freq: Once | INTRAVENOUS | Status: AC | PRN
  Administered 2017-08-30: 60 mL via INTRAVENOUS

## 2017-08-31 DIAGNOSIS — I10 Essential (primary) hypertension: Secondary | ICD-10-CM | POA: Diagnosis not present

## 2017-08-31 DIAGNOSIS — Z96652 Presence of left artificial knee joint: Secondary | ICD-10-CM | POA: Diagnosis not present

## 2017-08-31 DIAGNOSIS — Z85828 Personal history of other malignant neoplasm of skin: Secondary | ICD-10-CM | POA: Diagnosis not present

## 2017-08-31 DIAGNOSIS — Z7901 Long term (current) use of anticoagulants: Secondary | ICD-10-CM | POA: Diagnosis not present

## 2017-08-31 DIAGNOSIS — Z96651 Presence of right artificial knee joint: Secondary | ICD-10-CM | POA: Diagnosis not present

## 2017-08-31 DIAGNOSIS — M5136 Other intervertebral disc degeneration, lumbar region: Secondary | ICD-10-CM | POA: Diagnosis not present

## 2017-09-04 DIAGNOSIS — Z96652 Presence of left artificial knee joint: Secondary | ICD-10-CM | POA: Diagnosis not present

## 2017-09-04 DIAGNOSIS — Z85828 Personal history of other malignant neoplasm of skin: Secondary | ICD-10-CM | POA: Diagnosis not present

## 2017-09-04 DIAGNOSIS — Z7901 Long term (current) use of anticoagulants: Secondary | ICD-10-CM | POA: Diagnosis not present

## 2017-09-04 DIAGNOSIS — Z96651 Presence of right artificial knee joint: Secondary | ICD-10-CM | POA: Diagnosis not present

## 2017-09-04 DIAGNOSIS — M5136 Other intervertebral disc degeneration, lumbar region: Secondary | ICD-10-CM | POA: Diagnosis not present

## 2017-09-04 DIAGNOSIS — I10 Essential (primary) hypertension: Secondary | ICD-10-CM | POA: Diagnosis not present

## 2017-09-10 DIAGNOSIS — M6281 Muscle weakness (generalized): Secondary | ICD-10-CM | POA: Diagnosis not present

## 2017-09-10 DIAGNOSIS — M5136 Other intervertebral disc degeneration, lumbar region: Secondary | ICD-10-CM | POA: Diagnosis not present

## 2017-09-12 DIAGNOSIS — Z85828 Personal history of other malignant neoplasm of skin: Secondary | ICD-10-CM | POA: Diagnosis not present

## 2017-09-12 DIAGNOSIS — Z96651 Presence of right artificial knee joint: Secondary | ICD-10-CM | POA: Diagnosis not present

## 2017-09-12 DIAGNOSIS — I10 Essential (primary) hypertension: Secondary | ICD-10-CM | POA: Diagnosis not present

## 2017-09-12 DIAGNOSIS — M5136 Other intervertebral disc degeneration, lumbar region: Secondary | ICD-10-CM | POA: Diagnosis not present

## 2017-09-12 DIAGNOSIS — Z96652 Presence of left artificial knee joint: Secondary | ICD-10-CM | POA: Diagnosis not present

## 2017-09-12 DIAGNOSIS — Z7901 Long term (current) use of anticoagulants: Secondary | ICD-10-CM | POA: Diagnosis not present

## 2017-09-14 DIAGNOSIS — Z96651 Presence of right artificial knee joint: Secondary | ICD-10-CM | POA: Diagnosis not present

## 2017-09-14 DIAGNOSIS — M5136 Other intervertebral disc degeneration, lumbar region: Secondary | ICD-10-CM | POA: Diagnosis not present

## 2017-09-14 DIAGNOSIS — Z7901 Long term (current) use of anticoagulants: Secondary | ICD-10-CM | POA: Diagnosis not present

## 2017-09-14 DIAGNOSIS — Z96652 Presence of left artificial knee joint: Secondary | ICD-10-CM | POA: Diagnosis not present

## 2017-09-14 DIAGNOSIS — Z85828 Personal history of other malignant neoplasm of skin: Secondary | ICD-10-CM | POA: Diagnosis not present

## 2017-09-14 DIAGNOSIS — I10 Essential (primary) hypertension: Secondary | ICD-10-CM | POA: Diagnosis not present

## 2017-09-17 ENCOUNTER — Encounter: Payer: Self-pay | Admitting: Neurology

## 2017-09-17 ENCOUNTER — Ambulatory Visit (INDEPENDENT_AMBULATORY_CARE_PROVIDER_SITE_OTHER): Payer: Medicare Other | Admitting: Neurology

## 2017-09-17 VITALS — BP 118/80 | HR 82 | Ht 64.0 in | Wt 177.4 lb

## 2017-09-17 DIAGNOSIS — G20 Parkinson's disease: Secondary | ICD-10-CM | POA: Diagnosis not present

## 2017-09-17 MED ORDER — PAROXETINE HCL 20 MG PO TABS: 20.0000 mg | ORAL_TABLET | Freq: Every day | ORAL | 3 refills | Status: DC

## 2017-09-17 NOTE — Patient Instructions (Signed)
1.  Continue carbidopa-levodopa 25/100mg :  2 tablets at 8-8:30 AM, 2 tablets at 12-12:30 PM, 2 tablets 4-4:30 PM, 2 tablets 8-8:30 PM and Sinemet CR 50/200mg  at bedtime 2.  We will increase paroxetine to 20mg  daily.  Finish rest of 10mg  tablets by taking 2 tablets once daily 3.  Try to go to sleep sooner in the evening.  Move bedtime up by 30 minutes every week until desired time (10:30-11:30 PM). 4.  Follow up in 4 months

## 2017-09-17 NOTE — Progress Notes (Signed)
NEUROLOGY FOLLOW UP OFFICE NOTE  Henry Alvarez [id number]  HISTORY OF PRESENT ILLNESS: Henry Alvarez is an 84 year old man with history of a-fib (on warfarin), TIA, OA and HTN who presents for follow up regarding idiopathic Parkinson's disease. He is accompanied by his son and daughter who supplement recent history.   UPDATE: He takes carbidopa-levodopa 25/100mg  2 tablets at 8-8:30 AM, 2 tablets at 12-12:30 PM, 2 tablets at 4-4:30 PM, 2 tablets 8-8:30 PM and Sinemet CR 50/200mg  at bedtime.      He still goes to sleep around 12:30 AM.  He wakes up at 8-8:30 for his medication but will often sleep again until 10:30 to 11 AM.  He is more anxious and irritable.  HISTORY: Since 2010, he began experiencing tremors in the hand. It occurs mainly in the right hand, although this may be noticeable because he is right-handed. He has difficulty picking up utensils. He doesn't really notice it at rest. He also notes a tremor of his mouth. He said several falls over the last few years. He does have a walker at home, but instead uses a cane regularly. He does note symptoms consistent with freezing when he gets up from a chair. He has had sleep issues in the past, such as frequently waking up at night, but it has been better lately. He doesn't appear to have any history of REM sleep behavior disorder or nightmares. He does note some problems swallowing liquids. His sense of smell is okay. Another problem has been hoarseness. He has had vocal cord surgery to help with his hoarseness. He denies any symptoms consistent with depression. He does drive without issues. He often repeats questions to his son.  He had a modified barium swallow on 06/08/14, which revealed moderate sensorimotor pharyngeal dysphagia with delayed swallow initiation, decreased laryngeal elevation and laryngeal closure, resulting in silent aspiration. He was taught techniques such as chin tuck and superglottic swallow. Regular diet  and tectar thick liquids, chin tuck with liquids, swallow twice and pills whole with applesauce were recommended.  He was hospitalized in February 2017 for fever and congestion.  CT of head showed no acute findings.  He was discharged on Tamiflu.  Since then, he has had increased tremor of the right hand.  It occurs only at rest and waxes and wanes.  It is usually prominent when he is sitting and preoccupied, such as watching TV.  Sometimes, his right leg will shake too.    He has history of several surgeries, including his back and bilateral knees.  PAST MEDICAL HISTORY: Past Medical History:  Diagnosis Date  . Anemia   . Arthritis   . Atrial fibrillation ([place])   . BPH (benign prostatic hyperplasia)   . C. difficile colitis    hx of cdiff last fall 2016   . Dementia   . Depression   . Hypertension   . Iron deficiency anemia   . Parkinson disease ([place])   . Thrombocytopenia ([place])     MEDICATIONS: Current Outpatient Medications on File Prior to Visit  Medication Sig Dispense Refill  . acetaminophen (TYLENOL) 325 MG tablet Take 2 tablets (650 mg total) by mouth every 4 (four) hours as needed for mild pain, fever or headache. 30 tablet 0  . amLODipine (NORVASC) 2.5 MG tablet Take 2.5 mg by mouth daily.    . benzonatate (TESSALON) 100 MG capsule Take 100 mg by mouth 3 (three) times daily.    . carbidopa-levodopa (SINEMET CR) 50-200 MG  tablet Take 1 tablet by mouth at bedtime. 30 tablet 11  . carbidopa-levodopa (SINEMET IR) 25-100 MG tablet TAKE 2 TABS AT 8AM,1PM,AND 6PM 180 tablet 11  . donepezil (ARICEPT) 10 MG tablet TAKE ONE TABLET AT BEDTIME. 30 tablet 5  . donepezil (ARICEPT) 10 MG tablet TAKE ONE TABLET AT BEDTIME. 30 tablet 0  . donepezil (ARICEPT) 10 MG tablet TAKE ONE TABLET AT BEDTIME. 30 tablet 5  . ELIQUIS 2.5 MG TABS tablet Take 2.5 mg by mouth 2 (two) times daily.    . fluticasone (FLONASE) 50 MCG/ACT nasal spray Place into both nostrils daily.    Marland Kitchen guaiFENesin  (MUCINEX) 600 MG 12 hr tablet Take 600 mg by mouth 2 (two) times daily. Stop date 04/05/15    . hydrocortisone 2.5 % cream Apply 1 application topically daily as needed (irritation/dryness). To face and nose    . ipratropium-albuterol (DUONEB) 0.5-2.5 (3) MG/3ML SOLN Take 3 mLs by nebulization every 6 (six) hours as needed. 360 mL   . iron polysaccharides (NIFEREX) 150 MG capsule Take 150 mg by mouth daily.    Marland Kitchen ketoconazole (NIZORAL) 2 % cream Apply 1 application topically daily as needed for irritation. Face and nose    . latanoprost (XALATAN) 0.005 % ophthalmic solution Place 1 drop into both eyes at bedtime.     . Melatonin 5 MG CAPS Take 5 mg by mouth at bedtime.    . Multiple Vitamin (MULTIVITAMIN) tablet Take 1 tablet by mouth daily.    Marland Kitchen PARoxetine (PAXIL) 10 MG tablet TAKE 1 TABLET ONCE DAILY. 30 tablet 0  . polyethylene glycol (MIRALAX / GLYCOLAX) packet Take 17 g by mouth daily.    . Rivaroxaban (XARELTO) 15 MG TABS tablet Take 15 mg by mouth 2 (two) times daily with a meal.    . Saccharomyces boulardii (FLORASTOR PO) Take 1 capsule by mouth daily.     . tamsulosin (FLOMAX) 0.4 MG CAPS capsule Take 0.4 mg by mouth daily.     . traMADol-acetaminophen (ULTRACET) 37.5-325 MG tablet Take 1 tablet by mouth every 6 (six) hours as needed (as needed for pain).    Marland Kitchen UNABLE TO FIND Take 120 mLs by mouth 2 (two) times daily. Med Name: Med[facility]     No current facility-administered medications on file prior to visit.     ALLERGIES: Allergies  Allergen Reactions  . Ciprocin-Fluocin-Procin [Fluocinolone]     Past reactions to "quinolones"-just not to have it per Family member.     FAMILY HISTORY: Family History  Problem Relation Age of Onset  . Heart attack Father   . Angina Father   . Heart failure Mother   . Hypertension Sister   . Heart disease Unknown        grandmother  . Colon cancer Neg Hx   . Esophageal cancer Neg Hx   . Kidney disease Neg Hx   . Liver disease Neg Hx      SOCIAL HISTORY: Social History   Socioeconomic History  . Marital status: Widowed    Spouse name: Not on file  . Number of children: 2  . Years of education: Not on file  . Highest education level: Not on file  Occupational History  . Occupation: Retired Warehouse manager: RETIRED  Social Needs  . Financial resource strain: Not on file  . Food insecurity:    Worry: Not on file    Inability: Not on file  . Transportation needs:    Medical: Not  on file    Non-medical: Not on file  Tobacco Use  . Smoking status: Never Smoker  . Smokeless tobacco: Never Used  Substance and Sexual Activity  . Alcohol use: No    Alcohol/week: 0.0 standard drinks    Comment: 09/05/2012 "rarely; I had a glass of wine ~ 1 month ago"  . Drug use: No  . Sexual activity: Never  Lifestyle  . Physical activity:    Days per week: Not on file    Minutes per session: Not on file  . Stress: Not on file  Relationships  . Social connections:    Talks on phone: Not on file    Gets together: Not on file    Attends religious service: Not on file    Active member of club or organization: Not on file    Attends meetings of clubs or organizations: Not on file    Relationship status: Not on file  . Intimate partner violence:    Fear of current or ex partner: Not on file    Emotionally abused: Not on file    Physically abused: Not on file    Forced sexual activity: Not on file  Other Topics Concern  . Not on file  Social History Narrative  . Not on file    REVIEW OF SYSTEMS: Constitutional: No fevers, chills, or sweats, no generalized fatigue, change in appetite Eyes: No visual changes, double vision, eye pain Ear, nose and throat: No hearing loss, ear pain, nasal congestion, sore throat Cardiovascular: No chest pain, palpitations Respiratory:  No shortness of breath at rest or with exertion, wheezes GastrointestinaI: No nausea, vomiting, diarrhea, abdominal pain, fecal  incontinence Genitourinary:  No dysuria, urinary retention or frequency Musculoskeletal:  No neck pain, back pain Integumentary: No rash, pruritus, skin lesions Neurological: as above Psychiatric: No depression, insomnia, anxiety Endocrine: No palpitations, fatigue, diaphoresis, mood swings, change in appetite, change in weight, increased thirst Hematologic/Lymphatic:  No purpura, petechiae. Allergic/Immunologic: no itchy/runny eyes, nasal congestion, recent allergic reactions, rashes  PHYSICAL EXAM: Blood pressure 118/80, pulse 82, height 5\' 4"  (1.626 m), weight 177 lb 6 oz (80.5 kg), SpO2 92 %. General: No acute distress.  Patient appears well-groomed.   Head:  Normocephalic/atraumatic Eyes:  Fundi examined but not visualized Neck: supple, no paraspinal tenderness, full range of motion Heart:  Regular rate and rhythm Lungs:  Clear to auscultation bilaterally Back: No paraspinal tenderness Neurological Exam: alert and oriented to person, place, and time. Attention span and concentration mildly impaired, recent memory impaired, remote memory intact, fund of knowledge intact.  Visuospatial and executive functioning impaired.  Speech hypophonic but fluent and not dysarthric, language intact.  CN II-XII intact. Bradykinesia and cogwheel rigidity but otherwise bulk and tone normal, resting tremor of left h and.  Mild postural tremor in both hands, muscle strength 5/5 throughout.  Sensation to light touch, temperature and vibration intact.  Deep tendon reflexes 1+ in upper extremities, absent in lower extremities.  Finger to nose  testing intact.  Gait without dysmetria.  Shuffling gait.  Four step turn.  IMPRESSION: Idiopathic Parkinson's disease  PLAN: 1.  Continue current regimen of carbidopa-levodopa 25/100mg :  2 tablets at 8-8:30 AM, 2 tablets at 12-12:30 PM, 2 tablets 4-4:30 PM, 2 tablets 8-8:30 PM and Sinemet CR 50/200mg  at bedtime 2.  We will increase paroxetine to 20mg  daily.   3.  We  will try to improve sleep hygiene routine. Try to go to sleep sooner in the evening.  Move bedtime  up by 30 minutes every week until desired time (10:30-11:30 PM). 4.  Follow up in 4 months  35 minutes spent face to face with patient, over 50% spent discussing management.  Metta Clines, DO  CC: Reynold Bowen, MD

## 2017-09-18 ENCOUNTER — Encounter: Payer: Self-pay | Admitting: Neurology

## 2017-09-19 DIAGNOSIS — Z7901 Long term (current) use of anticoagulants: Secondary | ICD-10-CM | POA: Diagnosis not present

## 2017-09-19 DIAGNOSIS — M5136 Other intervertebral disc degeneration, lumbar region: Secondary | ICD-10-CM | POA: Diagnosis not present

## 2017-09-19 DIAGNOSIS — Z96651 Presence of right artificial knee joint: Secondary | ICD-10-CM | POA: Diagnosis not present

## 2017-09-19 DIAGNOSIS — Z96652 Presence of left artificial knee joint: Secondary | ICD-10-CM | POA: Diagnosis not present

## 2017-09-19 DIAGNOSIS — I10 Essential (primary) hypertension: Secondary | ICD-10-CM | POA: Diagnosis not present

## 2017-09-19 DIAGNOSIS — Z85828 Personal history of other malignant neoplasm of skin: Secondary | ICD-10-CM | POA: Diagnosis not present

## 2017-09-21 DIAGNOSIS — Z7901 Long term (current) use of anticoagulants: Secondary | ICD-10-CM | POA: Diagnosis not present

## 2017-09-21 DIAGNOSIS — Z85828 Personal history of other malignant neoplasm of skin: Secondary | ICD-10-CM | POA: Diagnosis not present

## 2017-09-21 DIAGNOSIS — I10 Essential (primary) hypertension: Secondary | ICD-10-CM | POA: Diagnosis not present

## 2017-09-21 DIAGNOSIS — M5136 Other intervertebral disc degeneration, lumbar region: Secondary | ICD-10-CM | POA: Diagnosis not present

## 2017-09-21 DIAGNOSIS — Z96651 Presence of right artificial knee joint: Secondary | ICD-10-CM | POA: Diagnosis not present

## 2017-09-21 DIAGNOSIS — Z96652 Presence of left artificial knee joint: Secondary | ICD-10-CM | POA: Diagnosis not present

## 2017-09-24 ENCOUNTER — Telehealth: Payer: Self-pay | Admitting: Neurology

## 2017-09-24 DIAGNOSIS — C4442 Squamous cell carcinoma of skin of scalp and neck: Secondary | ICD-10-CM | POA: Diagnosis not present

## 2017-09-24 NOTE — Telephone Encounter (Signed)
[name] calling because they had increased Paxil medication from 10 to 20mg  after appt with Tomi Likens a week ago. After 3-4 days of doubling dose the side effects were not good. Became lethargic. They cut back to 10mg  a day. Family wants to stay the course with the 10mg . Can you fix [facility] order for the 20mg  refill. Please call [name] at [phone] if you have any questions. Thanks.

## 2017-09-24 NOTE — Telephone Encounter (Signed)
Called and spoke with Henry Alvarez. He wanted to make sure Dr Tomi Likens was aware he is only taking 10 mg.

## 2017-09-28 DIAGNOSIS — Z96651 Presence of right artificial knee joint: Secondary | ICD-10-CM | POA: Diagnosis not present

## 2017-09-28 DIAGNOSIS — I10 Essential (primary) hypertension: Secondary | ICD-10-CM | POA: Diagnosis not present

## 2017-09-28 DIAGNOSIS — Z7901 Long term (current) use of anticoagulants: Secondary | ICD-10-CM | POA: Diagnosis not present

## 2017-09-28 DIAGNOSIS — Z85828 Personal history of other malignant neoplasm of skin: Secondary | ICD-10-CM | POA: Diagnosis not present

## 2017-09-28 DIAGNOSIS — Z96652 Presence of left artificial knee joint: Secondary | ICD-10-CM | POA: Diagnosis not present

## 2017-09-28 DIAGNOSIS — M5136 Other intervertebral disc degeneration, lumbar region: Secondary | ICD-10-CM | POA: Diagnosis not present

## 2017-11-02 DIAGNOSIS — R0609 Other forms of dyspnea: Secondary | ICD-10-CM | POA: Diagnosis not present

## 2017-11-02 DIAGNOSIS — G20 Parkinson's disease: Secondary | ICD-10-CM | POA: Diagnosis not present

## 2017-11-02 DIAGNOSIS — R531 Weakness: Secondary | ICD-10-CM | POA: Diagnosis not present

## 2017-11-02 DIAGNOSIS — R05 Cough: Secondary | ICD-10-CM | POA: Diagnosis not present

## 2017-11-02 DIAGNOSIS — R1310 Dysphagia, unspecified: Secondary | ICD-10-CM | POA: Diagnosis not present

## 2017-11-02 DIAGNOSIS — Z23 Encounter for immunization: Secondary | ICD-10-CM | POA: Diagnosis not present

## 2017-11-02 DIAGNOSIS — R0602 Shortness of breath: Secondary | ICD-10-CM | POA: Diagnosis not present

## 2017-11-05 ENCOUNTER — Other Ambulatory Visit (HOSPITAL_COMMUNITY): Payer: Self-pay | Admitting: Registered Nurse

## 2017-11-05 DIAGNOSIS — R0609 Other forms of dyspnea: Secondary | ICD-10-CM

## 2017-11-08 ENCOUNTER — Other Ambulatory Visit: Payer: Self-pay | Admitting: Neurology

## 2017-11-13 ENCOUNTER — Ambulatory Visit (HOSPITAL_COMMUNITY): Payer: Medicare Other | Attending: Cardiology

## 2017-11-13 ENCOUNTER — Other Ambulatory Visit: Payer: Self-pay

## 2017-11-13 ENCOUNTER — Encounter (INDEPENDENT_AMBULATORY_CARE_PROVIDER_SITE_OTHER): Payer: Self-pay

## 2017-11-13 DIAGNOSIS — R0609 Other forms of dyspnea: Secondary | ICD-10-CM | POA: Diagnosis not present

## 2017-11-26 DIAGNOSIS — H5201 Hypermetropia, right eye: Secondary | ICD-10-CM | POA: Diagnosis not present

## 2017-11-26 DIAGNOSIS — H401132 Primary open-angle glaucoma, bilateral, moderate stage: Secondary | ICD-10-CM | POA: Diagnosis not present

## 2017-12-07 DIAGNOSIS — J069 Acute upper respiratory infection, unspecified: Secondary | ICD-10-CM | POA: Diagnosis not present

## 2017-12-07 DIAGNOSIS — Z6828 Body mass index (BMI) 28.0-28.9, adult: Secondary | ICD-10-CM | POA: Diagnosis not present

## 2017-12-10 ENCOUNTER — Other Ambulatory Visit: Payer: Self-pay | Admitting: Neurology

## 2017-12-14 ENCOUNTER — Other Ambulatory Visit: Payer: Self-pay | Admitting: Neurology

## 2017-12-17 DIAGNOSIS — R05 Cough: Secondary | ICD-10-CM | POA: Diagnosis not present

## 2017-12-24 DIAGNOSIS — R82998 Other abnormal findings in urine: Secondary | ICD-10-CM | POA: Diagnosis not present

## 2017-12-24 DIAGNOSIS — E7849 Other hyperlipidemia: Secondary | ICD-10-CM | POA: Diagnosis not present

## 2017-12-24 DIAGNOSIS — M810 Age-related osteoporosis without current pathological fracture: Secondary | ICD-10-CM | POA: Diagnosis not present

## 2017-12-24 DIAGNOSIS — Z125 Encounter for screening for malignant neoplasm of prostate: Secondary | ICD-10-CM | POA: Diagnosis not present

## 2017-12-24 DIAGNOSIS — I10 Essential (primary) hypertension: Secondary | ICD-10-CM | POA: Diagnosis not present

## 2017-12-31 DIAGNOSIS — J189 Pneumonia, unspecified organism: Secondary | ICD-10-CM | POA: Diagnosis not present

## 2017-12-31 DIAGNOSIS — G459 Transient cerebral ischemic attack, unspecified: Secondary | ICD-10-CM | POA: Diagnosis not present

## 2017-12-31 DIAGNOSIS — Z0000 Encounter for general adult medical examination without abnormal findings: Secondary | ICD-10-CM | POA: Diagnosis not present

## 2017-12-31 DIAGNOSIS — N183 Chronic kidney disease, stage 3 (moderate): Secondary | ICD-10-CM | POA: Diagnosis not present

## 2017-12-31 DIAGNOSIS — H409 Unspecified glaucoma: Secondary | ICD-10-CM | POA: Diagnosis not present

## 2017-12-31 DIAGNOSIS — F0390 Unspecified dementia without behavioral disturbance: Secondary | ICD-10-CM | POA: Diagnosis not present

## 2017-12-31 DIAGNOSIS — R1310 Dysphagia, unspecified: Secondary | ICD-10-CM | POA: Diagnosis not present

## 2017-12-31 DIAGNOSIS — I951 Orthostatic hypotension: Secondary | ICD-10-CM | POA: Diagnosis not present

## 2017-12-31 DIAGNOSIS — K219 Gastro-esophageal reflux disease without esophagitis: Secondary | ICD-10-CM | POA: Diagnosis not present

## 2017-12-31 DIAGNOSIS — I351 Nonrheumatic aortic (valve) insufficiency: Secondary | ICD-10-CM | POA: Diagnosis not present

## 2017-12-31 DIAGNOSIS — D649 Anemia, unspecified: Secondary | ICD-10-CM | POA: Diagnosis not present

## 2017-12-31 DIAGNOSIS — F329 Major depressive disorder, single episode, unspecified: Secondary | ICD-10-CM | POA: Diagnosis not present

## 2018-01-07 DIAGNOSIS — M542 Cervicalgia: Secondary | ICD-10-CM | POA: Diagnosis not present

## 2018-01-07 DIAGNOSIS — M25511 Pain in right shoulder: Secondary | ICD-10-CM | POA: Diagnosis not present

## 2018-01-10 DIAGNOSIS — M25511 Pain in right shoulder: Secondary | ICD-10-CM | POA: Diagnosis not present

## 2018-01-10 DIAGNOSIS — M19011 Primary osteoarthritis, right shoulder: Secondary | ICD-10-CM | POA: Diagnosis not present

## 2018-02-11 ENCOUNTER — Ambulatory Visit: Payer: Medicare Other | Admitting: Neurology

## 2018-02-11 ENCOUNTER — Encounter

## 2018-03-04 ENCOUNTER — Ambulatory Visit (INDEPENDENT_AMBULATORY_CARE_PROVIDER_SITE_OTHER): Payer: Medicare Other | Admitting: Neurology

## 2018-03-04 ENCOUNTER — Encounter: Payer: Self-pay | Admitting: Neurology

## 2018-03-04 VITALS — BP 118/90 | HR 84 | Ht 65.0 in | Wt 166.0 lb

## 2018-03-04 DIAGNOSIS — G20 Parkinson's disease: Secondary | ICD-10-CM

## 2018-03-04 MED ORDER — PAROXETINE HCL 10 MG PO TABS: 10.0000 mg | ORAL_TABLET | Freq: Every day | ORAL | 5 refills | Status: DC

## 2018-03-04 NOTE — Progress Notes (Signed)
NEUROLOGY FOLLOW UP OFFICE NOTE  [facility] [id number]  HISTORY OF PRESENT ILLNESS: Henry Alvarez is an 85 year old man with history of a-fib (on warfarin), TIA, OA and HTN who presents for follow up regarding idiopathic Parkinson's disease.He is accompanied by his son and daughter who supplement recent history.  UPDATE: He takes carbidopa-levodopa 25/100mg  2 tablets at 8-8:30 AM, 2 tablets at 12-12:30 PM, 2 tablets at 4-4:30 PM, 2 tablets 8-8:30 PM and Sinemet CR 50/200mg  at bedtime.  He has trouble waking up at 8:30 to take his morning dose.  He now is waking up at 1:30 PM or later twice a week.  Caregiver comes at 11:30 AM to 12 PM to give him the first dose.  He sometimes cannot swallow his pills.  He has had a couple of falls.  He is more irritable.  Paxil was cut down to 10mg  (20mg  made him agitated).  HISTORY: Since 2010, he began experiencing tremors in the hand. It occurs mainly in the right hand, although this may be noticeable because he is right-handed. He has difficulty picking up utensils. He doesn't really notice it at rest. He also notes a tremor of his mouth. He said several falls over the last few years. He does have a walker at home, but instead uses a cane regularly. He does note symptoms consistent with freezing when he gets up from a chair. He has had sleep issues in the past, such as frequently waking up at night, but it has been better lately. He doesn't appear to have any history of REM sleep behavior disorder or nightmares. He does note some problems swallowing liquids. His sense of smell is okay. Another problem has been hoarseness. He has had vocal cord surgery to help with his hoarseness.He denies any symptoms consistent with depression. He does drive without issues.He often repeats questions to his son.  He had a modified barium swallow on 06/08/14, which revealed moderate sensorimotor pharyngeal dysphagia with delayed swallow initiation, decreased  laryngeal elevation and laryngeal closure, resulting in silent aspiration.He was taught techniques such as chin tuck and superglottic swallow.Regular diet and tectar thick liquids, chin tuck with liquids, swallow twice and pills whole with applesauce were recommended.  He was hospitalized in February 2017 for fever and congestion. CT of head showed no acute findings. He was discharged on Tamiflu. Since then, he has had increased tremor of the right hand. It occurs only at rest and waxes and wanes. It is usually prominent when he is sitting and preoccupied, such as watching TV. Sometimes, his right leg will shake too.   He has history of several surgeries, including his back and bilateral knees.   PAST MEDICAL HISTORY: Past Medical History:  Diagnosis Date  . Anemia   . Arthritis   . Atrial fibrillation ([place])   . BPH (benign prostatic hyperplasia)   . C. difficile colitis    hx of cdiff last fall 2016   . Dementia   . Depression   . Hypertension   . Iron deficiency anemia   . Parkinson disease ([place])   . Thrombocytopenia ([place])     MEDICATIONS: Current Outpatient Medications on File Prior to Visit  Medication Sig Dispense Refill  . acetaminophen (TYLENOL) 325 MG tablet Take 2 tablets (650 mg total) by mouth every 4 (four) hours as needed for mild pain, fever or headache. 30 tablet 0  . amLODipine (NORVASC) 2.5 MG tablet Take 2.5 mg by mouth daily.    . carbidopa-levodopa (  SINEMET CR) 50-200 MG tablet TAKE ONE TABLET AT BEDTIME. 90 tablet 1  . carbidopa-levodopa (SINEMET IR) 25-100 MG tablet TAKE 2 TABS AT 8AM,1PM,AND 6PM 180 tablet 11  . carbidopa-levodopa (SINEMET IR) 25-100 MG tablet TAKE 2 TABS AT 8AM, 12 PM, 4 PM AND 8 PM 240 tablet 5  . donepezil (ARICEPT) 10 MG tablet TAKE ONE TABLET AT BEDTIME. 30 tablet 5  . donepezil (ARICEPT) 10 MG tablet TAKE ONE TABLET AT BEDTIME. 30 tablet 5  . ELIQUIS 2.5 MG TABS tablet Take 2.5 mg by mouth 2 (two) times daily.    .  fluticasone (FLONASE) 50 MCG/ACT nasal spray Place into both nostrils daily.    Marland Kitchen guaiFENesin (MUCINEX) 600 MG 12 hr tablet Take 600 mg by mouth 2 (two) times daily. Stop date 04/05/15    . hydrocortisone 2.5 % cream Apply 1 application topically daily as needed (irritation/dryness). To face and nose    . ipratropium-albuterol (DUONEB) 0.5-2.5 (3) MG/3ML SOLN Take 3 mLs by nebulization every 6 (six) hours as needed. 360 mL   . iron polysaccharides (NIFEREX) 150 MG capsule Take 150 mg by mouth daily.    Marland Kitchen ketoconazole (NIZORAL) 2 % cream Apply 1 application topically daily as needed for irritation. Face and nose    . latanoprost (XALATAN) 0.005 % ophthalmic solution Place 1 drop into both eyes at bedtime.     . Melatonin 5 MG CAPS Take 5 mg by mouth at bedtime.    . Multiple Vitamin (MULTIVITAMIN) tablet Take 1 tablet by mouth daily.    Marland Kitchen PARoxetine (PAXIL) 20 MG tablet Take 1 tablet (20 mg total) by mouth daily. 30 tablet 3  . polyethylene glycol (MIRALAX / GLYCOLAX) packet Take 17 g by mouth daily.    . Rivaroxaban (XARELTO) 15 MG TABS tablet Take 15 mg by mouth 2 (two) times daily with a meal.    . Saccharomyces boulardii (FLORASTOR PO) Take 1 capsule by mouth daily.     . tamsulosin (FLOMAX) 0.4 MG CAPS capsule Take 0.4 mg by mouth daily.     . traMADol-acetaminophen (ULTRACET) 37.5-325 MG tablet Take 1 tablet by mouth every 6 (six) hours as needed (as needed for pain).    Marland Kitchen UNABLE TO FIND Take 120 mLs by mouth 2 (two) times daily. Med Name: Med[facility]     No current facility-administered medications on file prior to visit.     ALLERGIES: Allergies  Allergen Reactions  . Ciprocin-Fluocin-Procin [Fluocinolone]     Past reactions to "quinolones"-just not to have it per Family member.     FAMILY HISTORY: Family History  Problem Relation Age of Onset  . Heart attack Father   . Angina Father   . Heart failure Mother   . Hypertension Sister   . Heart disease Unknown        grandmother  .  Colon cancer Neg Hx   . Esophageal cancer Neg Hx   . Kidney disease Neg Hx   . Liver disease Neg Hx    SOCIAL HISTORY: Social History   Socioeconomic History  . Marital status: Widowed    Spouse name: Not on file  . Number of children: 2  . Years of education: Not on file  . Highest education level: Not on file  Occupational History  . Occupation: Retired Warehouse manager: RETIRED  Social Needs  . Financial resource strain: Not on file  . Food insecurity:    Worry: Not on file  Inability: Not on file  . Transportation needs:    Medical: Not on file    Non-medical: Not on file  Tobacco Use  . Smoking status: Never Smoker  . Smokeless tobacco: Never Used  Substance and Sexual Activity  . Alcohol use: No    Alcohol/week: 0.0 standard drinks    Comment: 09/05/2012 "rarely; I had a glass of wine ~ 1 month ago"  . Drug use: No  . Sexual activity: Not Currently  Lifestyle  . Physical activity:    Days per week: Not on file    Minutes per session: Not on file  . Stress: Not on file  Relationships  . Social connections:    Talks on phone: Not on file    Gets together: Not on file    Attends religious service: Not on file    Active member of club or organization: Not on file    Attends meetings of clubs or organizations: Not on file    Relationship status: Not on file  . Intimate partner violence:    Fear of current or ex partner: Not on file    Emotionally abused: Not on file    Physically abused: Not on file    Forced sexual activity: Not on file  Other Topics Concern  . Not on file  Social History Narrative  . Not on file    REVIEW OF SYSTEMS: Constitutional: No fevers, chills, or sweats, no generalized fatigue, change in appetite Eyes: No visual changes, double vision, eye pain Ear, nose and throat: No hearing loss, ear pain, nasal congestion, sore throat Cardiovascular: No chest pain, palpitations Respiratory:  No shortness of breath at rest or  with exertion, wheezes GastrointestinaI: No nausea, vomiting, diarrhea, abdominal pain, fecal incontinence Genitourinary:  No dysuria, urinary retention or frequency Musculoskeletal:  No neck pain, back pain Integumentary: No rash, pruritus, skin lesions Neurological: as above Psychiatric: No depression, insomnia, anxiety Endocrine: No palpitations, fatigue, diaphoresis, mood swings, change in appetite, change in weight, increased thirst Hematologic/Lymphatic:  No purpura, petechiae. Allergic/Immunologic: no itchy/runny eyes, nasal congestion, recent allergic reactions, rashes  PHYSICAL EXAM: Blood pressure 118/90, pulse 84, height 5\' 5"  (1.651 m), weight 166 lb (75.3 kg), SpO2 92 %. General: No acute distress.  Patient appears well-groomed.   Head:  Normocephalic/atraumatic Eyes:  Fundi examined but not visualized Neck: supple, no paraspinal tenderness, full range of motion Heart:  Regular rate and rhythm Lungs:  Clear to auscultation bilaterally Back: No paraspinal tenderness Neurological Exam: alert and oriented to person, place, and time. Attention span and concentration intact, recent and remote memory intact, fund of knowledge intact.  Speech hypophonic but fluent and not dysarthric, language intact.  CN II-XII intact. Bulk normal.  Bradykinesia and cogwheel rigidity and slight resting tremor of left hand.  Mild postural tremor in both hands; muscle strength 5/5 throughout.  Sensation to light touch  intact.  Deep tendon reflexes 1+ in upper extremities and absent in lower extremities.  Finger to nose testing without dysmetria.  Gait shuffling gait.  Four step turn.   Romberg negative.  IMPRESSION: Idiopathic Parkinson's disease  PLAN: 1.  Continue current regimen of carbidopa-levodopa 25/100mg :  2 tablets at 8-8:30 AM (if able), 2 tablets at 12-12:30 PM, 2 tablets 4-4:30 PM, 2 tablets 8-8:30 PM and Sinemet CR 50/200mg  at bedtime.  To help swallow, advised to cut tablets in half and  take each half separately. 2.  Continue paroxetine at 10mg  daily  3.  Supplement meals with Boost  or Ensure 4.  Follow up in 4 months  28 minutes spent face to face with patient, over 50% spent discussing management.  Metta Clines, DO  CC: Reynold Bowen, MD

## 2018-03-04 NOTE — Patient Instructions (Addendum)
1.  Continue carbidopa levodopa 2 tablets:  8-8:30 AM (if able), 11:30-12:00 PM, 4-4:30 PM, and 1 CR at bedtime.  Try cutting tablet in half to see if taking 1/2 tablet at a time is easier to swallow 2.  Continue Paxil 10mg  daily (refilled) 3.  Continue physical therapy 4.  Try drinking Ensure or Boost in between meals and maybe after dinner. 5.  Follow up in 4 months.

## 2018-05-07 ENCOUNTER — Other Ambulatory Visit: Payer: Self-pay | Admitting: Neurology

## 2018-06-10 ENCOUNTER — Other Ambulatory Visit: Payer: Self-pay | Admitting: Neurology

## 2018-06-21 ENCOUNTER — Emergency Department (HOSPITAL_COMMUNITY)
Admission: EM | Admit: 2018-06-21 | Discharge: 2018-06-21 | Disposition: A | Discharge status: Home / Self Care | Payer: Medicare Other | Attending: Emergency Medicine | Admitting: Emergency Medicine

## 2018-06-21 ENCOUNTER — Other Ambulatory Visit: Payer: Self-pay

## 2018-06-21 ENCOUNTER — Encounter (HOSPITAL_COMMUNITY): Payer: Self-pay

## 2018-06-21 ENCOUNTER — Emergency Department (HOSPITAL_COMMUNITY): Payer: Medicare Other

## 2018-06-21 DIAGNOSIS — R531 Weakness: Secondary | ICD-10-CM | POA: Insufficient documentation

## 2018-06-21 DIAGNOSIS — Z7901 Long term (current) use of anticoagulants: Secondary | ICD-10-CM | POA: Insufficient documentation

## 2018-06-21 DIAGNOSIS — E860 Dehydration: Secondary | ICD-10-CM

## 2018-06-21 LAB — CBC WITH DIFFERENTIAL/PLATELET
Abs Immature Granulocytes: 0.03 10*3/uL (ref 0.00–0.07)
Basophils Absolute: 0 10*3/uL (ref 0.0–0.1)
Basophils Relative: 1 %
Eosinophils Absolute: 0.1 10*3/uL (ref 0.0–0.5)
Eosinophils Relative: 1 %
HCT: 39.7 % (ref 39.0–52.0)
Hemoglobin: 12.3 g/dL — ABNORMAL LOW (ref 13.0–17.0)
Immature Granulocytes: 0 %
Lymphocytes Relative: 8 %
Lymphs Abs: 0.6 10*3/uL — ABNORMAL LOW (ref 0.7–4.0)
MCH: 30.3 pg (ref 26.0–34.0)
MCHC: 31 g/dL (ref 30.0–36.0)
MCV: 97.8 fL (ref 80.0–100.0)
Monocytes Absolute: 0.9 10*3/uL (ref 0.1–1.0)
Monocytes Relative: 12 %
Neutro Abs: 5.9 10*3/uL (ref 1.7–7.7)
Neutrophils Relative %: 78 %
Platelets: 119 10*3/uL — ABNORMAL LOW (ref 150–400)
RBC: 4.06 MIL/uL — ABNORMAL LOW (ref 4.22–5.81)
RDW: 14.9 % (ref 11.5–15.5)
WBC: 7.6 10*3/uL (ref 4.0–10.5)
nRBC: 0 % (ref 0.0–0.2)

## 2018-06-21 LAB — COMPREHENSIVE METABOLIC PANEL
ALT: 7 U/L (ref 0–44)
AST: 26 U/L (ref 15–41)
Albumin: 4.1 g/dL (ref 3.5–5.0)
Alkaline Phosphatase: 64 U/L (ref 38–126)
Anion gap: 5 (ref 5–15)
BUN: 32 mg/dL — ABNORMAL HIGH (ref 8–23)
CO2: 26 mmol/L (ref 22–32)
Calcium: 9.4 mg/dL (ref 8.9–10.3)
Chloride: 110 mmol/L (ref 98–111)
Creatinine, Ser: 1.39 mg/dL — ABNORMAL HIGH (ref 0.61–1.24)
GFR calc Af Amer: 51 mL/min — ABNORMAL LOW (ref >60)
GFR calc non Af Amer: 44 mL/min — ABNORMAL LOW (ref >60)
Glucose, Bld: 114 mg/dL — ABNORMAL HIGH (ref 70–99)
Potassium: 4.1 mmol/L (ref 3.5–5.1)
Sodium: 141 mmol/L (ref 135–145)
Total Bilirubin: 1.2 mg/dL (ref 0.3–1.2)
Total Protein: 7.2 g/dL (ref 6.5–8.1)

## 2018-06-21 LAB — URINALYSIS, ROUTINE W REFLEX MICROSCOPIC
Bilirubin Urine: NEGATIVE
Glucose, UA: NEGATIVE mg/dL
Hgb urine dipstick: NEGATIVE
Ketones, ur: 5 mg/dL — AB
Leukocytes,Ua: NEGATIVE
Nitrite: NEGATIVE
Protein, ur: 30 mg/dL — AB
Specific Gravity, Urine: 1.024 (ref 1.005–1.030)
pH: 5 (ref 5.0–8.0)

## 2018-06-21 LAB — TROPONIN I: Troponin I: 0.03 ng/mL (ref <0.03)

## 2018-06-21 LAB — PROTIME-INR
INR: 1.5 — ABNORMAL HIGH (ref 0.8–1.2)
Prothrombin Time: 17.7 seconds — ABNORMAL HIGH (ref 11.4–15.2)

## 2018-06-21 NOTE — ED Triage Notes (Signed)
Pt BIB EMS from home. Pt reports generalized weakness. Pt reports odor in urine.   BP 116/74 97.7 temp   CBG 115

## 2018-06-21 NOTE — ED Notes (Signed)
Pt unable to urinate at this time.   Will try again later.

## 2018-06-21 NOTE — ED Notes (Signed)
Bed: WA01 Expected date:  Expected time:  Means of arrival:  Comments: [facility]

## 2018-06-21 NOTE — ED Provider Notes (Signed)
[facility] DEPT Provider Note   CSN: [id number] Arrival date & time: 06/21/18  1921    History   Chief Complaint Chief Complaint  Patient presents with  . Weakness    HPI Henry Alvarez is a 85 y.o. male.     85 year old male with prior medical history as detailed below presents for evaluation of generalized weakness.  Patient reports longstanding history of Parkinson's.  Patient reports that over the last 2 to 3 days he is felt more weak.  He is concerned that he may have it urinary tract infection.  He is noticed that his urine is more concentrated.  He does report mild decrease in his p.o. intake.  He denies fever.  He denies chest pain, shortness of breath, cough, nausea, vomiting, abdominal pain, or other acute complaint.  Patient is alert and in no acute distress during my evaluation.  The history is provided by the patient and medical records.  Weakness  Severity:  Mild Onset quality:  Gradual Duration:  2 days Timing:  Constant Progression:  Waxing and waning Chronicity:  New Relieved by:  Nothing Worsened by:  Nothing Ineffective treatments:  None tried Associated symptoms: no fever     Past Medical History:  Diagnosis Date  . Anemia   . Arthritis   . Atrial fibrillation ([place])   . BPH (benign prostatic hyperplasia)   . C. difficile colitis    hx of cdiff last fall 2016   . Dementia ([place])   . Depression   . Hypertension   . Iron deficiency anemia   . Parkinson disease ([place])   . Thrombocytopenia [facility])     Patient Active Problem List   Diagnosis Date Noted  . Benign essential HTN 03/26/2015  . BPH (benign prostatic hyperplasia) 03/26/2015  . IDA (iron deficiency anemia) 03/26/2015  . Acute respiratory failure with hypoxia ([place]) 03/26/2015  . Thrombocytopenia ([place]) 11/02/2014  . Enteritis due to Clostridium difficile 09/22/2013  . Depression 09/22/2013  . Parkinson's disease ([place]) 08/20/2012  . Paroxysmal atrial  fibrillation ([facility])   . Long-term (current) use of anticoagulants     Past Surgical History:  Procedure Laterality Date  . CARDIAC CATHETERIZATION  10/30/2005   Est. EF 65% -- Single-vessel obstructive atherosclerotic coronary artery disease -- Normal left ventricular function -- Caysie Minnifield M. [place], M.D.  . CARPAL TUNNEL RELEASE Left   . CORONARY ANGIOPLASTY WITH STENT PLACEMENT  11/02/2005   intracoronary stenting of the proximal left anterior descending artery --  Hadessah Grennan M. [place], M.D.  . CYSTOSCOPY  12/21/2010   Procedure: CYSTOSCOPY;  Surgeon: Molli Hazard, MD;  Location: [facility];  Service: Urology;  Laterality: N/A;  . JOINT REPLACEMENT     right  . KYPHOSIS SURGERY  09/2010  . LARYNGOSCOPY N/A 08/26/2012   Procedure: DIRECT LARYNGOSCOPY WITH RADIESSE INJECTIONS ;  Surgeon: Melida Quitter, MD;  Location: [facility];  Service: ENT;  Laterality: N/A;  direct laryngoscopy with radiesse injections  . TOTAL KNEE ARTHROPLASTY     right  . TOTAL KNEE ARTHROPLASTY  08/07/2011   Procedure: TOTAL KNEE ARTHROPLASTY;  Surgeon: Gearlean Alf, MD;  Location: [facility];  Service: Orthopedics;  Laterality: Left;  . TRANSURETHRAL RESECTION OF PROSTATE  12/2010        Home Medications    Prior to Admission medications   Medication Sig Start Date End Date Taking? Authorizing Provider  carbidopa-levodopa (SINEMET CR) 50-200 MG tablet TAKE ONE TABLET AT BEDTIME. 05/07/18   Metta Clines  R, DO  carbidopa-levodopa (SINEMET IR) 25-100 MG tablet TAKE 2 TABS AT 8AM,1PM,AND 6PM 10/30/16   Jaffe, Adam R, DO  carbidopa-levodopa (SINEMET IR) 25-100 MG tablet TAKE 2 TABS AT 8AM, 12 PM, 4 PM AND 8 PM 12/14/17   Tomi Likens, Adam R, DO  donepezil (ARICEPT) 10 MG tablet TAKE ONE TABLET AT BEDTIME. 06/14/17   Tomi Likens, Adam R, DO  donepezil (ARICEPT) 10 MG tablet TAKE ONE TABLET AT BEDTIME. 12/10/17   Jaffe, Adam R, DO  donepezil (ARICEPT) 10 MG tablet TAKE ONE TABLET AT BEDTIME. 06/10/18   Jaffe, Adam R, DO  ELIQUIS 2.5 MG TABS  tablet Take 2.5 mg by mouth 2 (two) times daily. 02/19/15   [provider]  ipratropium-albuterol (DUONEB) 0.5-2.5 (3) MG/3ML SOLN Take 3 mLs by nebulization every 6 (six) hours as needed. 03/29/15   Elgergawy, Silver Huguenin, MD  iron polysaccharides (NIFEREX) 150 MG capsule Take 150 mg by mouth daily.    [provider]  ketoconazole (NIZORAL) 2 % cream Apply 1 application topically daily as needed for irritation. Face and nose 03/11/14   [provider]  latanoprost (XALATAN) 0.005 % ophthalmic solution Place 1 drop into both eyes at bedtime.  04/20/14   [provider]  Melatonin 5 MG CAPS Take 5 mg by mouth at bedtime.    [provider]  Multiple Vitamin (MULTIVITAMIN) tablet Take 1 tablet by mouth daily.    [provider]  PARoxetine (PAXIL) 10 MG tablet Take 1 tablet (10 mg total) by mouth daily. 03/04/18   Pieter Partridge, DO  polyethylene glycol (MIRALAX / GLYCOLAX) packet Take 17 g by mouth daily.    [provider]  Saccharomyces boulardii (FLORASTOR PO) Take 1 capsule by mouth daily.     [provider]  tamsulosin (FLOMAX) 0.4 MG CAPS capsule Take 0.4 mg by mouth daily.  09/21/14   [provider]  traMADol-acetaminophen (ULTRACET) 37.5-325 MG tablet Take 1 tablet by mouth every 6 (six) hours as needed (as needed for pain).    [provider]  UNABLE TO FIND Take 120 mLs by mouth 2 (two) times daily. Med Name: MedPass    [provider]    Family History Family History  Problem Relation Age of Onset  . Heart attack Father   . Angina Father   . Heart failure Mother   . Hypertension Sister   . Heart disease Other        grandmother  . Colon cancer Neg Hx   . Esophageal cancer Neg Hx   . Kidney disease Neg Hx   . Liver disease Neg Hx     Social History Social History   Tobacco Use  . Smoking status: Never Smoker  . Smokeless tobacco: Never Used  Substance Use Topics  . Alcohol use: No     Alcohol/week: 0.0 standard drinks    Comment: 09/05/2012 "rarely; I had a glass of wine ~ 1 month ago"  . Drug use: No     Allergies   Ciprocin-fluocin-procin [fluocinolone]   Review of Systems Review of Systems  Constitutional: Negative for fever.  Neurological: Positive for weakness.  All other systems reviewed and are negative.    Physical Exam Updated Vital Signs BP (!) 157/106   Pulse 88   Resp (!) 22   SpO2 95%   Physical Exam Vitals signs and nursing note reviewed.  Constitutional:      General: He is not in acute distress.  Appearance: Normal appearance. He is well-developed.  HENT:     Head: Normocephalic and atraumatic.  Eyes:     Conjunctiva/sclera: Conjunctivae normal.     Pupils: Pupils are equal, round, and reactive to light.  Neck:     Musculoskeletal: Normal range of motion and neck supple.  Cardiovascular:     Rate and Rhythm: Normal rate. Rhythm irregular.     Heart sounds: Normal heart sounds.  Pulmonary:     Effort: Pulmonary effort is normal. No respiratory distress.     Breath sounds: Normal breath sounds.  Abdominal:     General: There is no distension.     Palpations: Abdomen is soft.     Tenderness: There is no abdominal tenderness.  Musculoskeletal: Normal range of motion.        General: No deformity.  Skin:    General: Skin is warm and dry.  Neurological:     General: No focal deficit present.     Mental Status: He is alert and oriented to person, place, and time. Mental status is at baseline.     Cranial Nerves: No cranial nerve deficit.     Sensory: No sensory deficit.     Motor: No weakness.     Coordination: Coordination normal.      ED Treatments / Results  Labs (all labs ordered are listed, but only abnormal results are displayed) Labs Reviewed  URINALYSIS, ROUTINE W REFLEX MICROSCOPIC - Abnormal; Notable for the following components:      Result Value   Color, Urine AMBER (*)    APPearance HAZY (*)     Ketones, ur 5 (*)    Protein, ur 30 (*)    Bacteria, UA RARE (*)    All other components within normal limits  CBC WITH DIFFERENTIAL/PLATELET - Abnormal; Notable for the following components:   RBC 4.06 (*)    Hemoglobin 12.3 (*)    Platelets 119 (*)    Lymphs Abs 0.6 (*)    All other components within normal limits  COMPREHENSIVE METABOLIC PANEL - Abnormal; Notable for the following components:   Glucose, Bld 114 (*)    BUN 32 (*)    Creatinine, Ser 1.39 (*)    GFR calc non Af Amer 44 (*)    GFR calc Af Amer 51 (*)    All other components within normal limits  TROPONIN I - Abnormal; Notable for the following components:   Troponin I 0.03 (*)    All other components within normal limits  PROTIME-INR - Abnormal; Notable for the following components:   Prothrombin Time 17.7 (*)    INR 1.5 (*)    All other components within normal limits    EKG EKG Interpretation  Date/Time:  Friday Jun 21 2018 19:44:38 EDT Ventricular Rate:  91 PR Interval:    QRS Duration: 92 QT Interval:  379 QTC Calculation: 467 R Axis:   -35 Text Interpretation:  Atrial fibrillation Probable LVH with secondary repol abnrm Anterior Q waves, possibly due to LVH Artifact in lead(s) V1 Confirmed by Dene Gentry [phone]) on 06/21/2018 7:48:46 PM   Radiology Dg Chest Port 1 View  Result Date: 06/21/2018 CLINICAL DATA:  Generalized weakness.  Hypertension. EXAM: PORTABLE CHEST 1 VIEW COMPARISON:  January 18, 2017 chest radiograph and chest CT August 30, 2017 FINDINGS: There is mild scarring in the upper lobes. There is no edema or consolidation. There is cardiomegaly with pulmonary vascularity within normal limits. There is aortic atherosclerosis. No pneumothorax. Patient has  had previous kyphoplasty procedure in the lower thoracic region. IMPRESSION: No edema or consolidation. Mild scarring in the upper lobes. Stable cardiac prominence. Aortic Atherosclerosis (ICD10-I70.0). Electronically Signed   By: Lowella Grip III M.D.   On: 06/21/2018 20:34    Procedures Procedures (including critical care time)  Medications Ordered in ED Medications - No data to display   Initial Impression / Assessment and Plan / ED Course  I have reviewed the triage vital signs and the nursing notes.  Pertinent labs & imaging results that were available during my care of the patient were reviewed by me and considered in my medical decision making (see chart for details).        MDM  Screen complete  Henry Alvarez was evaluated in Emergency Department on 06/21/2018 for the symptoms described in the history of present illness. He was evaluated in the context of the global COVID-19 pandemic, which necessitated consideration that the patient might be at risk for infection with the SARS-CoV-2 virus that causes COVID-19. Institutional protocols and algorithms that pertain to the evaluation of patients at risk for COVID-19 are in a state of rapid change based on information released by regulatory bodies including the CDC and federal and state organizations. These policies and algorithms were followed during the patient's care in the ED.  Patient is presenting for evaluation of reported generalized weakness.  Patient was concerned about the possibility of a UTI.  Patient without evidence of infection on work-up.  Patient does appear to be clinically mildly dehydrated with mildly elevated creatinine.  Following IV fluids he does feel improved.  Of note, patient's troponin was noted to be at the upper limit of normal. He is without reported chest pain or shortness of breath. He declines delta troponin testing. He desires DC home.   Patient was offered admission for further work-up and/or observation.  He declined same.  He desires discharge home.  He does understand the need for close follow-up.  Strict return precautions given and understood.  Case was discussed with the patient's son - [name] - he is aware of the  work-up and findings.  Despite encouragement from the patient's son, the patient still desires discharge and adamantly refuses admission.    Final Clinical Impressions(s) / ED Diagnoses   Final diagnoses:  Weakness  Dehydration    ED Discharge Orders    None       Valarie Merino, MD 06/21/18 2308

## 2018-06-21 NOTE — Discharge Instructions (Addendum)
Please return for any problem.  Follow-up with your regular care provider as instructed. °

## 2018-07-09 ENCOUNTER — Other Ambulatory Visit: Payer: Self-pay | Admitting: Neurology

## 2018-07-11 ENCOUNTER — Encounter: Payer: Self-pay | Admitting: Neurology

## 2018-07-15 ENCOUNTER — Ambulatory Visit: Payer: Medicare Other | Admitting: Neurology

## 2018-08-02 ENCOUNTER — Other Ambulatory Visit: Payer: Self-pay | Admitting: Neurology

## 2018-08-09 ENCOUNTER — Other Ambulatory Visit: Payer: Self-pay | Admitting: Neurology

## 2018-08-12 ENCOUNTER — Other Ambulatory Visit: Payer: Self-pay | Admitting: Neurology

## 2018-08-13 NOTE — Telephone Encounter (Signed)
Requested Prescriptions   Pending Prescriptions Disp Refills  . donepezil (ARICEPT) 10 MG tablet [Pharmacy Med Name: DONEPEZIL HCL 10 MG TABLET] 30 tablet 0    Sig: TAKE ONE TABLET AT BEDTIME.   Rx last filled:07/09/18 #30 0 refills  Pt last seen:03/04/18  Follow up appt scheduled:10/11/18

## 2018-09-10 ENCOUNTER — Other Ambulatory Visit: Payer: Self-pay | Admitting: Neurology

## 2018-09-11 ENCOUNTER — Other Ambulatory Visit: Payer: Self-pay | Admitting: Neurology

## 2018-09-11 MED ORDER — DONEPEZIL HCL 10 MG PO TABS: 10.0000 mg | ORAL_TABLET | Freq: Every day | ORAL | 3 refills | Status: DC

## 2018-09-18 ENCOUNTER — Other Ambulatory Visit: Payer: Self-pay | Admitting: Neurology

## 2018-10-10 NOTE — Progress Notes (Signed)
NEUROLOGY FOLLOW UP OFFICE NOTE  Henry Alvarez [id number]  HISTORY OF PRESENT ILLNESS: Henry Alvarez is an 85 year old man with history of a-fib (on warfarin), TIA, OA and HTN who presents for follow up regarding idiopathic Parkinson's disease.He is accompanied by his sonand daughterwhosupplement recenthistory.  UPDATE: He takes carbidopa-levodopa 25/100mg  2 tablets at 8-8:30 AM, 2 tablets at 12-12:30 PM, 2 tablets at4-4:30 PM, 2 tablets 8-8:30 PMand Sinemet CR 50/200mg  at bedtime.  Paxil 10mg  daily (20mg  caused agitation)  More fatigued since COVID.  Less active.  Feels weaker and leg sometimes gives out.  He hasn't been sleeping well.  Sometimes wakes up in middle of night agitated, wanting to get out of bed.  Can't fall back to sleep.    HISTORY: Since 2010, he began experiencing tremors in the hand. It occurs mainly in the right hand, although this may be noticeable because he is right-handed. He has difficulty picking up utensils. He doesn't really notice it at rest. He also notes a tremor of his mouth. He said several falls over the last few years. He does have a walker at home, but instead uses a cane regularly. He does note symptoms consistent with freezing when he gets up from a chair. He has had sleep issues in the past, such as frequently waking up at night, but it has been better lately. He doesn't appear to have any history of REM sleep behavior disorder or nightmares. He does note some problems swallowing liquids. His sense of smell is okay. Another problem has been hoarseness. He has had vocal cord surgery to help with his hoarseness.He denies any symptoms consistent with depression. He does drive without issues.He often repeats questions to his son.  He had a modified barium swallow on 06/08/14, which revealed moderate sensorimotor pharyngeal dysphagia with delayed swallow initiation, decreased laryngeal elevation and laryngeal closure, resulting in silent  aspiration.He was taught techniques such as chin tuck and superglottic swallow.Regular diet and tectar thick liquids, chin tuck with liquids, swallow twice and pills whole with applesauce were recommended.  He was hospitalized in February 2017 for fever and congestion. CT of head showed no acute findings. He was discharged on Tamiflu. Since then, he has had increased tremor of the right hand. It occurs only at rest and waxes and wanes. It is usually prominent when he is sitting and preoccupied, such as watching TV. Sometimes, his right leg will shake too.   He has history of several surgeries, including his back and bilateral knees.  PAST MEDICAL HISTORY: Past Medical History:  Diagnosis Date  . Anemia   . Arthritis   . Atrial fibrillation ([place])   . BPH (benign prostatic hyperplasia)   . C. difficile colitis    hx of cdiff last fall 2016   . Dementia ([place])   . Depression   . Hypertension   . Iron deficiency anemia   . Parkinson disease ([place])   . Thrombocytopenia ([place])     MEDICATIONS: Current Outpatient Medications on File Prior to Visit  Medication Sig Dispense Refill  . carbidopa-levodopa (SINEMET CR) 50-200 MG tablet TAKE ONE TABLET AT BEDTIME. 90 tablet 0  . carbidopa-levodopa (SINEMET IR) 25-100 MG tablet TAKE 2 TABS AT 8AM,1PM,AND 6PM 180 tablet 11  . carbidopa-levodopa (SINEMET IR) 25-100 MG tablet TAKE 2 TABS AT 8AM, 12 PM, 4 PM AND 8 PM 240 tablet 5  . carbidopa-levodopa (SINEMET IR) 25-100 MG tablet TAKE 2 TABS AT 8AM, 12 PM, 4 PM AND 8  PM 240 tablet 0  . carbidopa-levodopa (SINEMET IR) 25-100 MG tablet TAKE 2 TABS AT 8AM, 12 PM, 4 PM AND 8 PM 240 tablet 2  . donepezil (ARICEPT) 10 MG tablet Take 1 tablet (10 mg total) by mouth at bedtime. 30 tablet 3  . ELIQUIS 2.5 MG TABS tablet Take 2.5 mg by mouth 2 (two) times daily.    Marland Kitchen ipratropium-albuterol (DUONEB) 0.5-2.5 (3) MG/3ML SOLN Take 3 mLs by nebulization every 6 (six) hours as needed. 360 mL   . iron  polysaccharides (NIFEREX) 150 MG capsule Take 150 mg by mouth daily.    Marland Kitchen ketoconazole (NIZORAL) 2 % cream Apply 1 application topically daily as needed for irritation. Face and nose    . latanoprost (XALATAN) 0.005 % ophthalmic solution Place 1 drop into both eyes at bedtime.     . Melatonin 5 MG CAPS Take 5 mg by mouth at bedtime.    . Multiple Vitamin (MULTIVITAMIN) tablet Take 1 tablet by mouth daily.    Marland Kitchen PARoxetine (PAXIL) 10 MG tablet TAKE 1 TABLET ONCE DAILY. 30 tablet 4  . polyethylene glycol (MIRALAX / GLYCOLAX) packet Take 17 g by mouth daily.    . Saccharomyces boulardii (FLORASTOR PO) Take 1 capsule by mouth daily.     . tamsulosin (FLOMAX) 0.4 MG CAPS capsule Take 0.4 mg by mouth daily.     . traMADol-acetaminophen (ULTRACET) 37.5-325 MG tablet Take 1 tablet by mouth every 6 (six) hours as needed (as needed for pain).    Marland Kitchen UNABLE TO FIND Take 120 mLs by mouth 2 (two) times daily. Med Name: MedPass     No current facility-administered medications on file prior to visit.     ALLERGIES: Allergies  Allergen Reactions  . Ciprocin-Fluocin-Procin [Fluocinolone]     Past reactions to "quinolones"-just not to have it per Family member.     FAMILY HISTORY: Family History  Problem Relation Age of Onset  . Heart attack Father   . Angina Father   . Heart failure Mother   . Hypertension Sister   . Heart disease Other        grandmother  . Colon cancer Neg Hx   . Esophageal cancer Neg Hx   . Kidney disease Neg Hx   . Liver disease Neg Hx     SOCIAL HISTORY: Social History   Socioeconomic History  . Marital status: Widowed    Spouse name: Not on file  . Number of children: 2  . Years of education: Not on file  . Highest education level: Not on file  Occupational History  . Occupation: Retired Warehouse manager: RETIRED  Social Needs  . Financial resource strain: Not on file  . Food insecurity    Worry: Not on file    Inability: Not on file  .  Transportation needs    Medical: Not on file    Non-medical: Not on file  Tobacco Use  . Smoking status: Never Smoker  . Smokeless tobacco: Never Used  Substance and Sexual Activity  . Alcohol use: No    Alcohol/week: 0.0 standard drinks    Comment: 09/05/2012 "rarely; I had a glass of wine ~ 1 month ago"  . Drug use: No  . Sexual activity: Not Currently  Lifestyle  . Physical activity    Days per week: Not on file    Minutes per session: Not on file  . Stress: Not on file  Relationships  . Social connections  Talks on phone: Not on file    Gets together: Not on file    Attends religious service: Not on file    Active member of club or organization: Not on file    Attends meetings of clubs or organizations: Not on file    Relationship status: Not on file  . Intimate partner violence    Fear of current or ex partner: Not on file    Emotionally abused: Not on file    Physically abused: Not on file    Forced sexual activity: Not on file  Other Topics Concern  . Not on file  Social History Narrative  . Not on file    REVIEW OF SYSTEMS: Constitutional: fatigue Eyes: No visual changes, double vision, eye pain Ear, nose and throat: No hearing loss, ear pain, nasal congestion, sore throat Cardiovascular: No chest pain, palpitations Respiratory:  No shortness of breath at rest or with exertion, wheezes GastrointestinaI: No nausea, vomiting, diarrhea, abdominal pain, fecal incontinence Genitourinary:  No dysuria, urinary retention or frequency Musculoskeletal:  No neck pain, back pain Integumentary: No rash, pruritus, skin lesions Neurological: as above Psychiatric: Difficult sleeping Endocrine: No palpitations, fatigue, diaphoresis, mood swings, change in appetite, change in weight, increased thirst Hematologic/Lymphatic:  No purpura, petechiae. Allergic/Immunologic: no itchy/runny eyes, nasal congestion, recent allergic reactions, rashes  PHYSICAL EXAM: Blood pressure  120/76, pulse 84, temperature 97.8 F (36.6 C), height 5\' 5"  (1.651 m), weight 163 lb (73.9 kg), SpO2 94 %. General: No acute distress.  Patient appears well-groomed.   Head:  Normocephalic/atraumatic Eyes:  Fundi examined but not visualized Neck: supple, no paraspinal tenderness, full range of motion Heart:  Regular rate and rhythm Lungs:  Clear to auscultation bilaterally Back: No paraspinal tenderness Neurological Exam: alert and oriented to person, place, and time. Attention span and concentration intact, recent and remote memory intact, fund of knowledge intact.  Speech fluent and not dysarthric, language intact.  CN II-XII intact. Bulk and tone normal.  Bradykinesia; cogwheel rigidity; slight resting tremor of left hand.  Mild postural tremor in both hands.  Muscle strength 5/5 throughout.  Sensation to light touch intact.  Deep tendon reflexes 1+ in upper extremities and absent in lower extremities.  Finger-to-nose testing without dysmetria.  Shuffling gait.  Poor step turn.  Romberg negative.  IMPRESSION: Idiopathic Parkinson's disease  PLAN: 1. Continue current regimen ofcarbidopa-levodopa 25/100mg : 2 tablets at 8-8:30 AM (if able), 2 tablets at 12-12:30 PM, 2 tablets 4-4:30 PM, 2 tablets 8-8:30 PM and Sinemet CR 50/200mg  at bedtime.  To help swallow, advised to cut tablets in half and take each half separately. 2. Continue paroxetine at 10mg  daily 3.Treatment for insomnia will be tricky as benzodiazepines may cause confusion and TCAs and atypical antipsychotics may contribute to increased risk of mortality (patient has borderline prolonged QTc).  He also has stiffness, so we will try a small dose of cyclobenzaprine 5mg  at bedtime.  Potential side effects such as confusion are also possible and discussed with patient and family.  They will monitor. 4.  Follow up in 4 months.  28 minutes spent face to face with patient, over 50% spent discussing management.  Henry Clines, DO  CC:  Reynold Bowen, MD

## 2018-10-11 ENCOUNTER — Other Ambulatory Visit: Payer: Self-pay

## 2018-10-11 ENCOUNTER — Ambulatory Visit (INDEPENDENT_AMBULATORY_CARE_PROVIDER_SITE_OTHER): Payer: Medicare Other | Admitting: Neurology

## 2018-10-11 ENCOUNTER — Encounter: Payer: Self-pay | Admitting: Neurology

## 2018-10-11 VITALS — BP 120/76 | HR 84 | Temp 97.8°F | Ht 65.0 in | Wt 163.0 lb

## 2018-10-11 DIAGNOSIS — G20A1 Parkinson's disease without dyskinesia, without mention of fluctuations: Secondary | ICD-10-CM

## 2018-10-11 DIAGNOSIS — G20 Parkinson's disease: Secondary | ICD-10-CM | POA: Diagnosis not present

## 2018-10-11 MED ORDER — CYCLOBENZAPRINE HCL 5 MG PO TABS: 5.0000 mg | ORAL_TABLET | Freq: Every day | ORAL | 1 refills | Status: DC

## 2018-10-11 NOTE — Patient Instructions (Signed)
1.  Start cyclobenzaprine 5mg  at bedtime. 2.  Continue current dosing of carbidopa-levodopa 3.  Continue Paxil 4.  Follow up in 4 months.

## 2018-10-21 ENCOUNTER — Inpatient Hospital Stay (HOSPITAL_COMMUNITY)
Admission: EM | Admit: 2018-10-21 | Discharge: 2018-10-26 | DRG: 291 | Disposition: A | Discharge status: Hospice | Payer: Medicare Other | Attending: Internal Medicine | Admitting: Internal Medicine

## 2018-10-21 ENCOUNTER — Emergency Department (HOSPITAL_COMMUNITY): Payer: Medicare Other

## 2018-10-21 ENCOUNTER — Other Ambulatory Visit: Payer: Self-pay

## 2018-10-21 ENCOUNTER — Encounter (HOSPITAL_COMMUNITY): Payer: Self-pay

## 2018-10-21 DIAGNOSIS — R55 Syncope and collapse: Secondary | ICD-10-CM | POA: Diagnosis present

## 2018-10-21 DIAGNOSIS — I2510 Atherosclerotic heart disease of native coronary artery without angina pectoris: Secondary | ICD-10-CM | POA: Diagnosis present

## 2018-10-21 DIAGNOSIS — E785 Hyperlipidemia, unspecified: Secondary | ICD-10-CM | POA: Diagnosis present

## 2018-10-21 DIAGNOSIS — I10 Essential (primary) hypertension: Secondary | ICD-10-CM | POA: Diagnosis present

## 2018-10-21 DIAGNOSIS — I083 Combined rheumatic disorders of mitral, aortic and tricuspid valves: Secondary | ICD-10-CM | POA: Diagnosis present

## 2018-10-21 DIAGNOSIS — Z66 Do not resuscitate: Secondary | ICD-10-CM

## 2018-10-21 DIAGNOSIS — R54 Age-related physical debility: Secondary | ICD-10-CM | POA: Diagnosis present

## 2018-10-21 DIAGNOSIS — R4189 Other symptoms and signs involving cognitive functions and awareness: Secondary | ICD-10-CM | POA: Diagnosis present

## 2018-10-21 DIAGNOSIS — I480 Paroxysmal atrial fibrillation: Secondary | ICD-10-CM | POA: Diagnosis not present

## 2018-10-21 DIAGNOSIS — D696 Thrombocytopenia, unspecified: Secondary | ICD-10-CM | POA: Diagnosis present

## 2018-10-21 DIAGNOSIS — Z20828 Contact with and (suspected) exposure to other viral communicable diseases: Secondary | ICD-10-CM | POA: Diagnosis present

## 2018-10-21 DIAGNOSIS — I509 Heart failure, unspecified: Secondary | ICD-10-CM

## 2018-10-21 DIAGNOSIS — I5043 Acute on chronic combined systolic (congestive) and diastolic (congestive) heart failure: Secondary | ICD-10-CM | POA: Diagnosis present

## 2018-10-21 DIAGNOSIS — Z8673 Personal history of transient ischemic attack (TIA), and cerebral infarction without residual deficits: Secondary | ICD-10-CM

## 2018-10-21 DIAGNOSIS — G20A1 Parkinson's disease without dyskinesia, without mention of fluctuations: Secondary | ICD-10-CM | POA: Diagnosis present

## 2018-10-21 DIAGNOSIS — Z79899 Other long term (current) drug therapy: Secondary | ICD-10-CM

## 2018-10-21 DIAGNOSIS — F329 Major depressive disorder, single episode, unspecified: Secondary | ICD-10-CM | POA: Diagnosis present

## 2018-10-21 DIAGNOSIS — Z79890 Hormone replacement therapy (postmenopausal): Secondary | ICD-10-CM

## 2018-10-21 DIAGNOSIS — I495 Sick sinus syndrome: Secondary | ICD-10-CM | POA: Diagnosis present

## 2018-10-21 DIAGNOSIS — I272 Pulmonary hypertension, unspecified: Secondary | ICD-10-CM | POA: Diagnosis present

## 2018-10-21 DIAGNOSIS — N183 Chronic kidney disease, stage 3 unspecified: Secondary | ICD-10-CM | POA: Diagnosis present

## 2018-10-21 DIAGNOSIS — R0600 Dyspnea, unspecified: Secondary | ICD-10-CM

## 2018-10-21 DIAGNOSIS — Z79891 Long term (current) use of opiate analgesic: Secondary | ICD-10-CM

## 2018-10-21 DIAGNOSIS — I5031 Acute diastolic (congestive) heart failure: Secondary | ICD-10-CM | POA: Diagnosis present

## 2018-10-21 DIAGNOSIS — J9601 Acute respiratory failure with hypoxia: Secondary | ICD-10-CM | POA: Diagnosis present

## 2018-10-21 DIAGNOSIS — Z6826 Body mass index (BMI) 26.0-26.9, adult: Secondary | ICD-10-CM

## 2018-10-21 DIAGNOSIS — D509 Iron deficiency anemia, unspecified: Secondary | ICD-10-CM | POA: Diagnosis present

## 2018-10-21 DIAGNOSIS — Z96653 Presence of artificial knee joint, bilateral: Secondary | ICD-10-CM | POA: Diagnosis present

## 2018-10-21 DIAGNOSIS — Z888 Allergy status to other drugs, medicaments and biological substances status: Secondary | ICD-10-CM

## 2018-10-21 DIAGNOSIS — Z955 Presence of coronary angioplasty implant and graft: Secondary | ICD-10-CM

## 2018-10-21 DIAGNOSIS — I130 Hypertensive heart and chronic kidney disease with heart failure and stage 1 through stage 4 chronic kidney disease, or unspecified chronic kidney disease: Principal | ICD-10-CM | POA: Diagnosis present

## 2018-10-21 DIAGNOSIS — R627 Adult failure to thrive: Secondary | ICD-10-CM | POA: Diagnosis present

## 2018-10-21 DIAGNOSIS — Z515 Encounter for palliative care: Secondary | ICD-10-CM

## 2018-10-21 DIAGNOSIS — D631 Anemia in chronic kidney disease: Secondary | ICD-10-CM | POA: Diagnosis present

## 2018-10-21 DIAGNOSIS — N179 Acute kidney failure, unspecified: Secondary | ICD-10-CM | POA: Diagnosis present

## 2018-10-21 DIAGNOSIS — J849 Interstitial pulmonary disease, unspecified: Secondary | ICD-10-CM | POA: Diagnosis present

## 2018-10-21 DIAGNOSIS — R4702 Dysphasia: Secondary | ICD-10-CM | POA: Diagnosis present

## 2018-10-21 DIAGNOSIS — Z8249 Family history of ischemic heart disease and other diseases of the circulatory system: Secondary | ICD-10-CM

## 2018-10-21 DIAGNOSIS — F0280 Dementia in other diseases classified elsewhere without behavioral disturbance: Secondary | ICD-10-CM | POA: Diagnosis present

## 2018-10-21 DIAGNOSIS — G4700 Insomnia, unspecified: Secondary | ICD-10-CM | POA: Diagnosis present

## 2018-10-21 DIAGNOSIS — N400 Enlarged prostate without lower urinary tract symptoms: Secondary | ICD-10-CM | POA: Diagnosis present

## 2018-10-21 DIAGNOSIS — R1310 Dysphagia, unspecified: Secondary | ICD-10-CM | POA: Diagnosis present

## 2018-10-21 DIAGNOSIS — Z7901 Long term (current) use of anticoagulants: Secondary | ICD-10-CM

## 2018-10-21 DIAGNOSIS — G20 Parkinson's disease: Secondary | ICD-10-CM | POA: Diagnosis present

## 2018-10-21 LAB — COMPREHENSIVE METABOLIC PANEL
ALT: 5 U/L (ref 0–44)
AST: 36 U/L (ref 15–41)
Albumin: 3.5 g/dL (ref 3.5–5.0)
Alkaline Phosphatase: 56 U/L (ref 38–126)
Anion gap: 9 (ref 5–15)
BUN: 24 mg/dL — ABNORMAL HIGH (ref 8–23)
CO2: 25 mmol/L (ref 22–32)
Calcium: 9.1 mg/dL (ref 8.9–10.3)
Chloride: 105 mmol/L (ref 98–111)
Creatinine, Ser: 1.33 mg/dL — ABNORMAL HIGH (ref 0.61–1.24)
GFR calc Af Amer: 53 mL/min — ABNORMAL LOW (ref >60)
GFR calc non Af Amer: 46 mL/min — ABNORMAL LOW (ref >60)
Glucose, Bld: 107 mg/dL — ABNORMAL HIGH (ref 70–99)
Potassium: 4.1 mmol/L (ref 3.5–5.1)
Sodium: 139 mmol/L (ref 135–145)
Total Bilirubin: 1.4 mg/dL — ABNORMAL HIGH (ref 0.3–1.2)
Total Protein: 6.3 g/dL — ABNORMAL LOW (ref 6.5–8.1)

## 2018-10-21 LAB — BRAIN NATRIURETIC PEPTIDE: B Natriuretic Peptide: 1289.8 pg/mL — ABNORMAL HIGH (ref 0.0–100.0)

## 2018-10-21 LAB — CBC WITH DIFFERENTIAL/PLATELET
Abs Immature Granulocytes: 0.03 10*3/uL (ref 0.00–0.07)
Basophils Absolute: 0 10*3/uL (ref 0.0–0.1)
Basophils Relative: 1 %
Eosinophils Absolute: 0 10*3/uL (ref 0.0–0.5)
Eosinophils Relative: 1 %
HCT: 39.1 % (ref 39.0–52.0)
Hemoglobin: 12.8 g/dL — ABNORMAL LOW (ref 13.0–17.0)
Immature Granulocytes: 1 %
Lymphocytes Relative: 9 %
Lymphs Abs: 0.5 10*3/uL — ABNORMAL LOW (ref 0.7–4.0)
MCH: 31.8 pg (ref 26.0–34.0)
MCHC: 32.7 g/dL (ref 30.0–36.0)
MCV: 97.3 fL (ref 80.0–100.0)
Monocytes Absolute: 0.6 10*3/uL (ref 0.1–1.0)
Monocytes Relative: 11 %
Neutro Abs: 4.2 10*3/uL (ref 1.7–7.7)
Neutrophils Relative %: 77 %
Platelets: 95 10*3/uL — ABNORMAL LOW (ref 150–400)
RBC: 4.02 MIL/uL — ABNORMAL LOW (ref 4.22–5.81)
RDW: 15.9 % — ABNORMAL HIGH (ref 11.5–15.5)
WBC: 5.3 10*3/uL (ref 4.0–10.5)
nRBC: 0 % (ref 0.0–0.2)

## 2018-10-21 LAB — D-DIMER, QUANTITATIVE: D-Dimer, Quant: 0.73 ug/mL-FEU — ABNORMAL HIGH (ref 0.00–0.50)

## 2018-10-21 MED ORDER — CARBIDOPA-LEVODOPA 25-100 MG PO TABS
2.0000 | ORAL_TABLET | Freq: Four times a day (QID) | ORAL | Status: DC
  Administered 2018-10-22 – 2018-10-26 (×16): 2 via ORAL
  Filled 2018-10-21 (×19): qty 2

## 2018-10-21 MED ORDER — ACETAMINOPHEN 325 MG PO TABS: 650.0000 mg | ORAL_TABLET | Freq: Four times a day (QID) | ORAL | Status: DC | PRN

## 2018-10-21 MED ORDER — LATANOPROST 0.005 % OP SOLN
1.0000 [drp] | Freq: Every day | OPHTHALMIC | Status: DC
  Administered 2018-10-21 – 2018-10-25 (×4): 1 [drp] via OPHTHALMIC
  Filled 2018-10-21 (×2): qty 2.5

## 2018-10-21 MED ORDER — ADULT MULTIVITAMIN W/MINERALS CH
1.0000 | ORAL_TABLET | Freq: Every day | ORAL | Status: DC
  Administered 2018-10-22 – 2018-10-26 (×4): 1 via ORAL
  Filled 2018-10-21 (×4): qty 1

## 2018-10-21 MED ORDER — MELATONIN 3 MG PO TABS
3.0000 mg | ORAL_TABLET | Freq: Every day | ORAL | Status: DC
  Administered 2018-10-21 – 2018-10-25 (×5): 3 mg via ORAL
  Filled 2018-10-21 (×6): qty 1

## 2018-10-21 MED ORDER — ONDANSETRON HCL 4 MG PO TABS: 4.0000 mg | ORAL_TABLET | Freq: Four times a day (QID) | ORAL | Status: DC | PRN

## 2018-10-21 MED ORDER — APIXABAN 2.5 MG PO TABS
2.5000 mg | ORAL_TABLET | Freq: Two times a day (BID) | ORAL | Status: DC
  Administered 2018-10-21 – 2018-10-26 (×10): 2.5 mg via ORAL
  Filled 2018-10-21 (×12): qty 1

## 2018-10-21 MED ORDER — FUROSEMIDE 10 MG/ML IJ SOLN
40.0000 mg | Freq: Once | INTRAMUSCULAR | Status: AC
  Administered 2018-10-21: 20:00:00 40 mg via INTRAVENOUS
  Filled 2018-10-21: qty 4

## 2018-10-21 MED ORDER — POLYSACCHARIDE IRON COMPLEX 150 MG PO CAPS
150.0000 mg | ORAL_CAPSULE | Freq: Every day | ORAL | Status: DC
  Administered 2018-10-22 – 2018-10-24 (×3): 150 mg via ORAL
  Filled 2018-10-21 (×4): qty 1

## 2018-10-21 MED ORDER — ACETAMINOPHEN 650 MG RE SUPP: 650.0000 mg | Freq: Four times a day (QID) | RECTAL | Status: DC | PRN

## 2018-10-21 MED ORDER — DONEPEZIL HCL 10 MG PO TABS
10.0000 mg | ORAL_TABLET | Freq: Every day | ORAL | Status: DC
  Administered 2018-10-21 – 2018-10-24 (×4): 10 mg via ORAL
  Filled 2018-10-21 (×4): qty 1

## 2018-10-21 MED ORDER — TAMSULOSIN HCL 0.4 MG PO CAPS
0.4000 mg | ORAL_CAPSULE | Freq: Every day | ORAL | Status: DC
  Administered 2018-10-22 – 2018-10-26 (×5): 0.4 mg via ORAL
  Filled 2018-10-21 (×5): qty 1

## 2018-10-21 MED ORDER — CYCLOBENZAPRINE HCL 10 MG PO TABS
5.0000 mg | ORAL_TABLET | Freq: Every day | ORAL | Status: DC
  Administered 2018-10-21: 5 mg via ORAL
  Filled 2018-10-21 (×2): qty 1

## 2018-10-21 MED ORDER — POLYETHYLENE GLYCOL 3350 17 G PO PACK
17.0000 g | PACK | Freq: Every day | ORAL | Status: DC
  Administered 2018-10-22 – 2018-10-26 (×4): 17 g via ORAL
  Filled 2018-10-21 (×4): qty 1

## 2018-10-21 MED ORDER — ONDANSETRON HCL 4 MG/2ML IJ SOLN: 4.0000 mg | Freq: Four times a day (QID) | INTRAMUSCULAR | Status: DC | PRN

## 2018-10-21 MED ORDER — TRAMADOL-ACETAMINOPHEN 37.5-325 MG PO TABS
1.0000 | ORAL_TABLET | Freq: Four times a day (QID) | ORAL | Status: DC | PRN
  Administered 2018-10-22: 1 via ORAL
  Filled 2018-10-21: qty 1

## 2018-10-21 MED ORDER — CARBIDOPA-LEVODOPA ER 50-200 MG PO TBCR
1.0000 | EXTENDED_RELEASE_TABLET | Freq: Every day | ORAL | Status: DC
  Administered 2018-10-21 – 2018-10-25 (×5): 1 via ORAL
  Filled 2018-10-21 (×6): qty 1

## 2018-10-21 MED ORDER — PAROXETINE HCL 10 MG PO TABS
10.0000 mg | ORAL_TABLET | Freq: Every day | ORAL | Status: DC
  Administered 2018-10-22: 09:00:00 10 mg via ORAL
  Filled 2018-10-21: qty 1

## 2018-10-21 NOTE — H&P (Addendum)
History and Physical    Henry Alvarez [id number] DOB: [birth year] DOA: 10/21/2018  PCP: Reynold Bowen, MD  Patient coming from: Home.  History obtained from patient's son.  ER physician.  Previous records and patient.  Chief Complaint: Syncope tremors shortness of breath.  HPI: Henry Alvarez is a 85 y.o. male with history of Parkinson's disease, atrial fibrillation, CAD, iron deficiency anemia, dementia, chronic thrombocytopenia was brought to the ER after patient had a brief syncopal episode during which patient also had some tremors and was experiencing shortness of breath.  As per patient son patient does have shortness of breath chronically from his weakness from Parkinson's disease.  Has not had any chest pain fever chills nausea vomiting or diarrhea.  Has not had any productive cough.  Today around 2 PM when patient was trying to go out in the cough patient suddenly felt weak and passed out for a minute or 2 following which patient appeared confused but did not have any incontinence of urine or bowel.  The upper part of the extremities had tremors.  Patient was brought to the ER.  As per patient's son only new medication started was Flexeril by patient's neurologist last week for sleeping.  ED Course: In the ER patient was appearing at back at baseline.  Mildly hypoxic.  Chest x-ray unremarkable.  COVID-19 test was negative.  EKG showed A. fib with rate around 90 bpm.  Patient's labs show BNP of 1289.  Patient was given 1 dose of Lasix 40 mg IV following which patient had good diuresis.  At the time of my exam patient is not in distress and patient admitted for further observation.  Other labs show hemoglobin of 12.8 platelets 95 creatinine 1.3.  D-dimer was mildly elevated 0.73 CT head unremarkable.  Review of Systems: As per HPI, rest all negative.   Past Medical History:  Diagnosis Date   Anemia    Arthritis    Atrial fibrillation (HCC)    BPH (benign prostatic  hyperplasia)    C. difficile colitis    hx of cdiff last fall 2016    Dementia ([place])    Depression    Hypertension    Iron deficiency anemia    Parkinson disease (HCC)    Thrombocytopenia ([place])     Past Surgical History:  Procedure Laterality Date   CARDIAC CATHETERIZATION  10/30/2005   Est. EF 65% -- Single-vessel obstructive atherosclerotic coronary artery disease -- Normal left ventricular function -- Peter M. Martinique, M.D.   CARPAL TUNNEL RELEASE Left    CORONARY ANGIOPLASTY WITH STENT PLACEMENT  11/02/2005   intracoronary stenting of the proximal left anterior descending artery --  Peter M. Martinique, M.D.   CYSTOSCOPY  12/21/2010   Procedure: CYSTOSCOPY;  Surgeon: Molli Hazard, MD;  Location: [facility];  Service: Urology;  Laterality: N/A;   JOINT REPLACEMENT     right   KYPHOSIS SURGERY  09/2010   LARYNGOSCOPY N/A 08/26/2012   Procedure: DIRECT LARYNGOSCOPY WITH RADIESSE INJECTIONS ;  Surgeon: Melida Quitter, MD;  Location: [place];  Service: ENT;  Laterality: N/A;  direct laryngoscopy with radiesse injections   TOTAL KNEE ARTHROPLASTY     right   TOTAL KNEE ARTHROPLASTY  08/07/2011   Procedure: TOTAL KNEE ARTHROPLASTY;  Surgeon: Gearlean Alf, MD;  Location: [facility];  Service: Orthopedics;  Laterality: Left;   TRANSURETHRAL RESECTION OF PROSTATE  12/2010     reports that he has never smoked. He has never used smokeless  tobacco. He reports that he does not drink alcohol or use drugs.  Allergies  Allergen Reactions   Ciprocin-Fluocin-Procin [Fluocinolone]     Past reactions to "quinolones"-just not to have it per Family member.     Family History  Problem Relation Age of Onset   Heart attack Father    Angina Father    Heart failure Mother    Hypertension Sister    Heart disease Other        grandmother   Colon cancer Neg Hx    Esophageal cancer Neg Hx    Kidney disease Neg Hx    Liver disease Neg Hx     Prior to Admission medications     Medication Sig Start Date End Date Taking? Authorizing Provider  carbidopa-levodopa (SINEMET CR) 50-200 MG tablet Take 1 tablet by mouth at bedtime.    [provider]  carbidopa-levodopa (SINEMET IR) 25-100 MG tablet TAKE 2 TABS AT 8AM, 12 PM, 4 PM AND 8 PM 08/12/18   Jaffe, Adam R, DO  cyclobenzaprine (FLEXERIL) 5 MG tablet Take 1 tablet (5 mg total) by mouth at bedtime. 10/11/18   Tomi Likens, Adam R, DO  donepezil (ARICEPT) 10 MG tablet Take 1 tablet (10 mg total) by mouth at bedtime. 09/11/18   Jaffe, Adam R, DO  ELIQUIS 2.5 MG TABS tablet Take 2.5 mg by mouth 2 (two) times daily. 02/19/15   [provider]  ipratropium-albuterol (DUONEB) 0.5-2.5 (3) MG/3ML SOLN Take 3 mLs by nebulization every 6 (six) hours as needed. Patient not taking: Reported on 10/11/2018 03/29/15   Elgergawy, Silver Huguenin, MD  iron polysaccharides (NIFEREX) 150 MG capsule Take 150 mg by mouth daily.    [provider]  ketoconazole (NIZORAL) 2 % cream Apply 1 application topically daily as needed for irritation. Face and nose 03/11/14   [provider]  latanoprost (XALATAN) 0.005 % ophthalmic solution Place 1 drop into both eyes at bedtime.  04/20/14   [provider]  Melatonin 5 MG CAPS Take 5 mg by mouth at bedtime.    [provider]  Multiple Vitamin (MULTIVITAMIN) tablet Take 1 tablet by mouth daily.    [provider]  PARoxetine (PAXIL) 10 MG tablet TAKE 1 TABLET ONCE DAILY. 09/18/18   Pieter Partridge, DO  polyethylene glycol (MIRALAX / GLYCOLAX) packet Take 17 g by mouth daily.    [provider]  Saccharomyces boulardii (FLORASTOR PO) Take 1 capsule by mouth daily.     [provider]  tamsulosin (FLOMAX) 0.4 MG CAPS capsule Take 0.4 mg by mouth daily.  09/21/14   [provider]  traMADol-acetaminophen (ULTRACET) 37.5-325 MG tablet Take 1 tablet by mouth every 6 (six) hours as needed (as needed for pain).    [provider]  UNABLE TO  FIND Take 120 mLs by mouth 2 (two) times daily. Med Name: MedPass    [provider]    Physical Exam: Constitutional: Moderately built and nourished. Vitals:   10/21/18 1845 10/21/18 1950 10/21/18 2035 10/21/18 2045  BP: (!) 149/99 (!) 162/115  (!) 150/99  Pulse: 78 86 99 98  Resp: (!) 33 (!) 28 20 (!) 24  Temp:      TempSrc:      SpO2: 100% 100% 99% (!) 79%  Weight:      Height:       Eyes: Anicteric no pallor. ENMT: No discharge from the ears eyes nose or mouth. Neck: No mass or.  No neck rigidity.  Respiratory: No rhonchi or crepitations. Cardiovascular: S1-S2 heard. Abdomen: Soft nontender bowel sounds present. Musculoskeletal: No edema.  No joint effusion. Skin: No rash. Neurologic: Alert awake oriented to place and person.  Moves all extremities. Psychiatric: Oriented to place and person.   Labs on Admission: I have personally reviewed following labs and imaging studies  CBC: Recent Labs  Lab 10/21/18 1700  WBC 5.3  NEUTROABS 4.2  HGB 12.8*  HCT 39.1  MCV 97.3  PLT 95*   Basic Metabolic Panel: Recent Labs  Lab 10/21/18 1700  NA 139  K 4.1  CL 105  CO2 25  GLUCOSE 107*  BUN 24*  CREATININE 1.33*  CALCIUM 9.1   GFR: Estimated Creatinine Clearance: 30.8 mL/min (A) (by C-G formula based on SCr of 1.33 mg/dL (H)). Liver Function Tests: Recent Labs  Lab 10/21/18 1700  AST 36  ALT 5  ALKPHOS 56  BILITOT 1.4*  PROT 6.3*  ALBUMIN 3.5   No results for input(s): LIPASE, AMYLASE in the last 168 hours. No results for input(s): AMMONIA in the last 168 hours. Coagulation Profile: No results for input(s): INR, PROTIME in the last 168 hours. Cardiac Enzymes: No results for input(s): CKTOTAL, CKMB, CKMBINDEX, TROPONINI in the last 168 hours. BNP (last 3 results) No results for input(s): PROBNP in the last 8760 hours. HbA1C: No results for input(s): HGBA1C in the last 72 hours. CBG: No results for input(s): GLUCAP in the last 168 hours. Lipid  Profile: No results for input(s): CHOL, HDL, LDLCALC, TRIG, CHOLHDL, LDLDIRECT in the last 72 hours. Thyroid Function Tests: No results for input(s): TSH, T4TOTAL, FREET4, T3FREE, THYROIDAB in the last 72 hours. Anemia Panel: No results for input(s): VITAMINB12, FOLATE, FERRITIN, TIBC, IRON, RETICCTPCT in the last 72 hours. Urine analysis:    Component Value Date/Time   COLORURINE AMBER (A) 06/21/2018 1939   APPEARANCEUR HAZY (A) 06/21/2018 1939   LABSPEC 1.024 06/21/2018 1939   PHURINE 5.0 06/21/2018 1939   GLUCOSEU NEGATIVE 06/21/2018 1939   HGBUR NEGATIVE 06/21/2018 1939   BILIRUBINUR NEGATIVE 06/21/2018 1939   BILIRUBINUR small (A) 01/18/2017 1709   KETONESUR 5 (A) 06/21/2018 1939   PROTEINUR 30 (A) 06/21/2018 1939   UROBILINOGEN 1.0 01/18/2017 1709   UROBILINOGEN 0.2 12/06/2014 2045   NITRITE NEGATIVE 06/21/2018 1939   LEUKOCYTESUR NEGATIVE 06/21/2018 1939   Sepsis Labs: @LABRCNTIP (procalcitonin:4,lacticidven:4) )No results found for this or any previous visit (from the past 240 hour(s)).   Radiological Exams on Admission: Ct Head Wo Contrast  Result Date: 10/21/2018 CLINICAL DATA:  Altered LOC EXAM: CT HEAD WITHOUT CONTRAST TECHNIQUE: Contiguous axial images were obtained from the base of the skull through the vertex without intravenous contrast. COMPARISON:  CT brain 03/26/2015 FINDINGS: Brain: Motion degradation over the posterior fossa and inferior brain. No acute consolidation or pleural effusion. Prominent atrophy. Moderate hypodensity in the white matter consistent with small vessel ischemic change. Stable ventricle size. Vascular: No hyperdense vessels.  Carotid vascular calcification Skull: No fracture Sinuses/Orbits: Mucosal thickening in the maxillary and ethmoid sinuses Other: None IMPRESSION: 1. Mild motion degraded study 2. No definite CT evidence for acute intracranial abnormality 3. Atrophy and small vessel ischemic change of white matter Electronically Signed    By: Donavan Foil M.D.   On: 10/21/2018 18:28   Dg Chest Port 1 View  Result Date: 10/21/2018 CLINICAL DATA:  Per pt's son: they were placing pt in passenger seat of car Today and while driving pt begun to look blank in the face and  drooling and unable to response when spoken to. Son called EMS because he noted that he was having increased tremors which concerned the son as possible seizure activity. Pt w/ hx of parkinsonian tremors, son reports today's episode was just more severe. EMS reports pt is in afib during transport, which he has a remote hx of, been off meds for it for over 10 years and has not had recurrent episode. Per pt no chest pain but SOB during exam. Hx of A-fib, HTN. EXAM: PORTABLE CHEST 1 VIEW COMPARISON:  06/21/2018 FINDINGS: Cardiac silhouette is mildly enlarged. No mediastinal or hilar masses. Lungs demonstrate stable prominent bronchovascular markings. No evidence of pneumonia or pulmonary edema. No pleural effusion or pneumothorax. Skeletal structures are demineralized. Prior vertebroplasty has been performed in the lower thoracic and lumbar spine. IMPRESSION: No acute cardiopulmonary disease. Electronically Signed   By: Lajean Manes M.D.   On: 10/21/2018 17:05    EKG: Independently reviewed.  A. fib rate controlled.  Assessment/Plan Principal Problem:   Acute CHF (congestive heart failure) (HCC) Active Problems:   Paroxysmal atrial fibrillation (HCC)   Long term current use of anticoagulant therapy   Parkinson's disease (HCC)   BPH (benign prostatic hyperplasia)   Syncope    1. Possible acute CHF last EF measured in October 2019 was 50 to [id number] diastolic function was not able to be measured due to A. fib.  Patient did receive 1 dose of Lasix 40 mg IV.  Based on the response we will plan further doses.  Since d-dimer was elevated I have ordered a VQ scan. 2. Syncope -patient was orthostatic in the ER likely causing patient's syncope.  Since patient has had previous  episodes of tachybradycardia syndrome will consult cardiology to further paperwork.  Since patient also had some tremors and some confusion after the episode will check EEG.  Closely monitor in telemetry. 3. History of A. fib presently takes apixaban. 4. History of Parkinson's disease on Sinemet. 5. History of BPH on tamsulosin. 6. Chronic anemia and thrombocytopenia follow CBC.  Takes iron supplements. 7. History of CAD -denies any chest pain.  Troponin pending.  We will hold Flexeril and Ultracet for now until we get EEG.  This was a new medication started last week by her primary care physician.  Closely observe without the medication.   DVT prophylaxis: Apixaban. Code Status: Full code confirmed with patient's son. Family Communication: [name]. Disposition Plan: Home. Consults called: Cardiology. Admission status: Observation.   Rise Patience MD [facility] Pager [phone].  If 7PM-7AM, please contact night-coverage [email or web address] Password [facility]  10/21/2018, 9:58 PM

## 2018-10-21 NOTE — ED Provider Notes (Addendum)
[facility] EMERGENCY DEPARTMENT Provider Note   CSN: [id number] Arrival date & time: 10/21/18  1536     History   Chief Complaint Chief Complaint  Patient presents with   Tremors   Pre Syncope    HPI Henry Alvarez is a 85 y.o. male.     Patient is a 85 year old male with a history of Parkinson's disease, thrombocytopenia, dementia, atrial fibrillation who is presenting today with loss of consciousness.  Patient states that he does get episodes of dizziness at times and shortness of breath with ambulating but today's episode was very severe.  The son states he started to look like his knees were getting weak and he got him in the car and then he was staring off into space, drooling and had mild tremors of his upper extremities that lasted for most likely less than a minute.  Son states he was initially a bit confused by the time EMS got there but was starting to come back around.  Son states he recently started taking Flexeril 1-1/2 weeks ago because he was not sleeping as well at night.  Son also states that this week may have been slightly more short of breath than normal.  Patient denies any chest pain.  He always has balance issues with walking but is been about the same.  Appetite has been slightly less over the last week.  Patient states right now he feels fine but just a little bit tired.  No prior history of seizures.   The history is provided by the patient and a relative.    Past Medical History:  Diagnosis Date   Anemia    Arthritis    Atrial fibrillation (HCC)    BPH (benign prostatic hyperplasia)    C. difficile colitis    hx of cdiff last fall 2016    Dementia ([place])    Depression    Hypertension    Iron deficiency anemia    Parkinson disease (HCC)    Thrombocytopenia ([place])     Patient Active Problem List   Diagnosis Date Noted   Benign essential HTN 03/26/2015   BPH (benign prostatic hyperplasia) 03/26/2015   IDA (iron  deficiency anemia) 03/26/2015   Acute respiratory failure with hypoxia ([place]) 03/26/2015   Thrombocytopenia ([place]) 11/02/2014   Enteritis due to Clostridium difficile 09/22/2013   Depression 09/22/2013   Parkinson's disease ([place]) 08/20/2012   Paroxysmal atrial fibrillation (HCC)    Long-term (current) use of anticoagulants     Past Surgical History:  Procedure Laterality Date   CARDIAC CATHETERIZATION  10/30/2005   Est. EF 65% -- Single-vessel obstructive atherosclerotic coronary artery disease -- Normal left ventricular function -- Peter M. Martinique, M.D.   CARPAL TUNNEL RELEASE Left    CORONARY ANGIOPLASTY WITH STENT PLACEMENT  11/02/2005   intracoronary stenting of the proximal left anterior descending artery --  Peter M. Martinique, M.D.   CYSTOSCOPY  12/21/2010   Procedure: CYSTOSCOPY;  Surgeon: Molli Hazard, MD;  Location: [facility];  Service: Urology;  Laterality: N/A;   JOINT REPLACEMENT     right   KYPHOSIS SURGERY  09/2010   LARYNGOSCOPY N/A 08/26/2012   Procedure: DIRECT LARYNGOSCOPY WITH RADIESSE INJECTIONS ;  Surgeon: Melida Quitter, MD;  Location: [facility];  Service: ENT;  Laterality: N/A;  direct laryngoscopy with radiesse injections   TOTAL KNEE ARTHROPLASTY     right   TOTAL KNEE ARTHROPLASTY  08/07/2011   Procedure: TOTAL KNEE ARTHROPLASTY;  Surgeon: Dione Plover  Aluisio, MD;  Location: [facility];  Service: Orthopedics;  Laterality: Left;   TRANSURETHRAL RESECTION OF PROSTATE  12/2010        Home Medications    Prior to Admission medications   Medication Sig Start Date End Date Taking? Authorizing Provider  carbidopa-levodopa (SINEMET CR) 50-200 MG tablet Take 1 tablet by mouth at bedtime.    [provider]  carbidopa-levodopa (SINEMET IR) 25-100 MG tablet TAKE 2 TABS AT 8AM, 12 PM, 4 PM AND 8 PM 08/12/18   Jaffe, Adam R, DO  cyclobenzaprine (FLEXERIL) 5 MG tablet Take 1 tablet (5 mg total) by mouth at bedtime. 10/11/18   Tomi Likens, Adam R, DO  donepezil  (ARICEPT) 10 MG tablet Take 1 tablet (10 mg total) by mouth at bedtime. 09/11/18   Jaffe, Adam R, DO  ELIQUIS 2.5 MG TABS tablet Take 2.5 mg by mouth 2 (two) times daily. 02/19/15   [provider]  ipratropium-albuterol (DUONEB) 0.5-2.5 (3) MG/3ML SOLN Take 3 mLs by nebulization every 6 (six) hours as needed. Patient not taking: Reported on 10/11/2018 03/29/15   Elgergawy, Silver Huguenin, MD  iron polysaccharides (NIFEREX) 150 MG capsule Take 150 mg by mouth daily.    [provider]  ketoconazole (NIZORAL) 2 % cream Apply 1 application topically daily as needed for irritation. Face and nose 03/11/14   [provider]  latanoprost (XALATAN) 0.005 % ophthalmic solution Place 1 drop into both eyes at bedtime.  04/20/14   [provider]  Melatonin 5 MG CAPS Take 5 mg by mouth at bedtime.    [provider]  Multiple Vitamin (MULTIVITAMIN) tablet Take 1 tablet by mouth daily.    [provider]  PARoxetine (PAXIL) 10 MG tablet TAKE 1 TABLET ONCE DAILY. 09/18/18   Pieter Partridge, DO  polyethylene glycol (MIRALAX / GLYCOLAX) packet Take 17 g by mouth daily.    [provider]  Saccharomyces boulardii (FLORASTOR PO) Take 1 capsule by mouth daily.     [provider]  tamsulosin (FLOMAX) 0.4 MG CAPS capsule Take 0.4 mg by mouth daily.  09/21/14   [provider]  traMADol-acetaminophen (ULTRACET) 37.5-325 MG tablet Take 1 tablet by mouth every 6 (six) hours as needed (as needed for pain).    [provider]  UNABLE TO FIND Take 120 mLs by mouth 2 (two) times daily. Med Name: MedPass    [provider]    Family History Family History  Problem Relation Age of Onset   Heart attack Father    Angina Father    Heart failure Mother    Hypertension Sister    Heart disease Other        grandmother   Colon cancer Neg Hx    Esophageal cancer Neg Hx    Kidney disease Neg Hx    Liver disease Neg Hx     Social  History Social History   Tobacco Use   Smoking status: Never Smoker   Smokeless tobacco: Never Used  Substance Use Topics   Alcohol use: No    Alcohol/week: 0.0 standard drinks    Comment: 09/05/2012 "rarely; I had a glass of wine ~ 1 month ago"   Drug use: No     Allergies   Ciprocin-fluocin-procin [fluocinolone]   Review of Systems Review of Systems  All other systems reviewed and are negative.    Physical Exam Updated Vital Signs BP (!) 140/98    Pulse 84    Temp 97.7 F (  36.5 C) (Oral)    Resp 12    Ht 5\' 5"  (1.651 m)    Wt 73 kg    SpO2 100%    BMI 26.78 kg/m   Physical Exam Vitals signs and nursing note reviewed.  Constitutional:      General: He is not in acute distress.    Appearance: He is well-developed and normal weight.  HENT:     Head: Normocephalic and atraumatic.  Eyes:     Conjunctiva/sclera: Conjunctivae normal.     Pupils: Pupils are equal, round, and reactive to light.  Neck:     Musculoskeletal: Normal range of motion and neck supple.     Comments: Kyphosis Cardiovascular:     Rate and Rhythm: Normal rate. Rhythm irregularly irregular.     Pulses: Normal pulses.     Heart sounds: No murmur.  Pulmonary:     Effort: Pulmonary effort is normal. No respiratory distress.     Breath sounds: Rales present. No wheezing.     Comments: Intermittent tachypnea Abdominal:     General: There is no distension.     Palpations: Abdomen is soft.     Tenderness: There is no abdominal tenderness. There is no guarding or rebound.  Musculoskeletal: Normal range of motion.        General: No tenderness.     Comments: Trace edema in bilateral lower ext  Skin:    General: Skin is warm and dry.     Findings: No erythema or rash.  Neurological:     Mental Status: He is alert and oriented to person, place, and time.     Comments: Resting tremor but improves with intention.  Able to move all 4 extremities.  Speech normal.  Sensation intact.  Psychiatric:          Mood and Affect: Mood normal.        Behavior: Behavior normal.        Thought Content: Thought content normal.      ED Treatments / Results  Labs (all labs ordered are listed, but only abnormal results are displayed) Labs Reviewed  CBC WITH DIFFERENTIAL/PLATELET - Abnormal; Notable for the following components:      Result Value   RBC 4.02 (*)    Hemoglobin 12.8 (*)    RDW 15.9 (*)    Platelets 95 (*)    Lymphs Abs 0.5 (*)    All other components within normal limits  COMPREHENSIVE METABOLIC PANEL - Abnormal; Notable for the following components:   Glucose, Bld 107 (*)    BUN 24 (*)    Creatinine, Ser 1.33 (*)    Total Protein 6.3 (*)    Total Bilirubin 1.4 (*)    GFR calc non Af Amer 46 (*)    GFR calc Af Amer 53 (*)    All other components within normal limits  BRAIN NATRIURETIC PEPTIDE - Abnormal; Notable for the following components:   B Natriuretic Peptide 1,289.8 (*)    All other components within normal limits  D-DIMER, QUANTITATIVE (NOT AT [facility]) - Abnormal; Notable for the following components:   D-Dimer, Quant 0.73 (*)    All other components within normal limits  SARS CORONAVIRUS 2 (TAT 6-24 HRS)  CBC  COMPREHENSIVE METABOLIC PANEL  TSH  MAGNESIUM    EKG EKG Interpretation  Date/Time:  Monday October 21 2018 15:47:27 EDT Ventricular Rate:  91 PR Interval:    QRS Duration: 93 QT Interval:  406 QTC Calculation: 500 R Axis:   -  95 Text Interpretation:  Atrial fibrillation Left anterior fascicular block Anteroseptal infarct, old Abnormal T, consider ischemia, lateral leads No significant change since last tracing Confirmed by Blanchie Dessert [phone]) on 10/21/2018 4:48:02 PM   Radiology Dg Chest Port 1 View  Result Date: 10/21/2018 CLINICAL DATA:  Per pt's son: they were placing pt in passenger seat of car Today and while driving pt begun to look blank in the face and drooling and unable to response when spoken to. Son called EMS because he noted  that he was having increased tremors which concerned the son as possible seizure activity. Pt w/ hx of parkinsonian tremors, son reports today's episode was just more severe. EMS reports pt is in afib during transport, which he has a remote hx of, been off meds for it for over 10 years and has not had recurrent episode. Per pt no chest pain but SOB during exam. Hx of A-fib, HTN. EXAM: PORTABLE CHEST 1 VIEW COMPARISON:  06/21/2018 FINDINGS: Cardiac silhouette is mildly enlarged. No mediastinal or hilar masses. Lungs demonstrate stable prominent bronchovascular markings. No evidence of pneumonia or pulmonary edema. No pleural effusion or pneumothorax. Skeletal structures are demineralized. Prior vertebroplasty has been performed in the lower thoracic and lumbar spine. IMPRESSION: No acute cardiopulmonary disease. Electronically Signed   By: Lajean Manes M.D.   On: 10/21/2018 17:05    Procedures Procedures (including critical care time)  Medications Ordered in ED Medications - No data to display   Initial Impression / Assessment and Plan / ED Course  I have reviewed the triage vital signs and the nursing notes.  Pertinent labs & imaging results that were available during my care of the patient were reviewed by me and considered in my medical decision making (see chart for details).       Elderly male with multiple medical problems including Parkinson's disease who is presenting today with what sounds like most likely a near syncopal event but cannot rule out seizure.  Patient currently just states he feels slightly tired but is otherwise at baseline.  He states he has had these events before but this is definitely been the worst.  They only happen when he standing or ambulating.  He started to feel this today while he was walking out to the car and son states he completely lost consciousness.  Patient's EKG shows atrial fibrillation rate controlled and patient does not currently take any medication  for this.  Patient is tachypneic on exam and has some mild rales.  Only trace edema in the lower extremities but concern for possible CHF in the setting of A. fib.  No prior history of clots and patient has still been active at home.  Patient did recently start Flexeril 1-1/2 weeks ago 5 mg at bedtime for sleep but son has noted over the last week he has had slightly decreased appetite.  No cough, fever, congestion, vomiting or diarrhea.  On exam patient is well-appearing.  Concern for potential orthostatic hypotension related to dysautonomia from his Parkinson's disease versus PE versus CHF versus possible seizure from Flexeril.  Labs and imaging pending.  Vital signs stable at this time.  7:25 PM Patient's chest x-ray without acute findings, CBC, CMP without significant changes.  D-dimer is 0.7 but age-adjusted is within normal limits.  BNP is elevated today at 1289.  Suspect that may be what is causing him to be more short of breath with exertion and feeling weak.  Patient's last echo was 1 year ago and  showed an EF of 50 to 55%.  Patient is also requiring 2 L of oxygen here to maintain oxygen sats greater than 90%.  Will admit for diuresis and repeat echo.  Final Clinical Impressions(s) / ED Diagnoses   Final diagnoses:  Acute congestive heart failure, unspecified heart failure type [facility])    ED Discharge Orders    None       Blanchie Dessert, MD 10/21/18 1850    Blanchie Dessert, MD 10/21/18 2245

## 2018-10-21 NOTE — ED Triage Notes (Addendum)
Pt BIB [facility] for eval of episode of SOB and dizziness. Pt reports that this can happen when walking longer distances, but today was more severe. Pt also has been having episodes of tachypnea and shallow breathing, which both pt and family confirm is baseline for him. Son called EMS because he noted that he was having increased tremors which concerned the son as possible seizure activity. Pt w/ hx of parkinsonian tremors, son reports today's episode was just more severe. EMS reports pt is in afib during transport, which he has a remote hx of, been off meds for it for over 10 years and has not had recurrent episode.

## 2018-10-21 NOTE — ED Notes (Signed)
Orthostatic VS not attempted pt was very short of breath.  RN notified.

## 2018-10-21 NOTE — ED Notes (Signed)
Patient transported to CT 

## 2018-10-22 ENCOUNTER — Other Ambulatory Visit: Payer: Self-pay

## 2018-10-22 ENCOUNTER — Inpatient Hospital Stay (HOSPITAL_COMMUNITY): Payer: Medicare Other

## 2018-10-22 DIAGNOSIS — I340 Nonrheumatic mitral (valve) insufficiency: Secondary | ICD-10-CM | POA: Diagnosis not present

## 2018-10-22 DIAGNOSIS — F0280 Dementia in other diseases classified elsewhere without behavioral disturbance: Secondary | ICD-10-CM | POA: Diagnosis present

## 2018-10-22 DIAGNOSIS — R627 Adult failure to thrive: Secondary | ICD-10-CM | POA: Diagnosis present

## 2018-10-22 DIAGNOSIS — I361 Nonrheumatic tricuspid (valve) insufficiency: Secondary | ICD-10-CM

## 2018-10-22 DIAGNOSIS — R1310 Dysphagia, unspecified: Secondary | ICD-10-CM | POA: Diagnosis present

## 2018-10-22 DIAGNOSIS — I5031 Acute diastolic (congestive) heart failure: Secondary | ICD-10-CM | POA: Diagnosis present

## 2018-10-22 DIAGNOSIS — Z7901 Long term (current) use of anticoagulants: Secondary | ICD-10-CM | POA: Diagnosis not present

## 2018-10-22 DIAGNOSIS — Z66 Do not resuscitate: Secondary | ICD-10-CM | POA: Diagnosis present

## 2018-10-22 DIAGNOSIS — I495 Sick sinus syndrome: Secondary | ICD-10-CM | POA: Diagnosis not present

## 2018-10-22 DIAGNOSIS — D696 Thrombocytopenia, unspecified: Secondary | ICD-10-CM | POA: Diagnosis present

## 2018-10-22 DIAGNOSIS — I509 Heart failure, unspecified: Secondary | ICD-10-CM | POA: Diagnosis present

## 2018-10-22 DIAGNOSIS — R4702 Dysphasia: Secondary | ICD-10-CM | POA: Diagnosis present

## 2018-10-22 DIAGNOSIS — I2510 Atherosclerotic heart disease of native coronary artery without angina pectoris: Secondary | ICD-10-CM | POA: Diagnosis present

## 2018-10-22 DIAGNOSIS — Z96653 Presence of artificial knee joint, bilateral: Secondary | ICD-10-CM | POA: Diagnosis present

## 2018-10-22 DIAGNOSIS — I4891 Unspecified atrial fibrillation: Secondary | ICD-10-CM

## 2018-10-22 DIAGNOSIS — I5033 Acute on chronic diastolic (congestive) heart failure: Secondary | ICD-10-CM | POA: Diagnosis not present

## 2018-10-22 DIAGNOSIS — I5043 Acute on chronic combined systolic (congestive) and diastolic (congestive) heart failure: Secondary | ICD-10-CM | POA: Diagnosis present

## 2018-10-22 DIAGNOSIS — N400 Enlarged prostate without lower urinary tract symptoms: Secondary | ICD-10-CM | POA: Diagnosis present

## 2018-10-22 DIAGNOSIS — G20 Parkinson's disease: Secondary | ICD-10-CM | POA: Diagnosis present

## 2018-10-22 DIAGNOSIS — Z20828 Contact with and (suspected) exposure to other viral communicable diseases: Secondary | ICD-10-CM | POA: Diagnosis present

## 2018-10-22 DIAGNOSIS — I5021 Acute systolic (congestive) heart failure: Secondary | ICD-10-CM | POA: Diagnosis not present

## 2018-10-22 DIAGNOSIS — J849 Interstitial pulmonary disease, unspecified: Secondary | ICD-10-CM | POA: Diagnosis present

## 2018-10-22 DIAGNOSIS — D509 Iron deficiency anemia, unspecified: Secondary | ICD-10-CM | POA: Diagnosis present

## 2018-10-22 DIAGNOSIS — I480 Paroxysmal atrial fibrillation: Secondary | ICD-10-CM | POA: Diagnosis present

## 2018-10-22 DIAGNOSIS — Z515 Encounter for palliative care: Secondary | ICD-10-CM | POA: Diagnosis not present

## 2018-10-22 DIAGNOSIS — Z955 Presence of coronary angioplasty implant and graft: Secondary | ICD-10-CM | POA: Diagnosis not present

## 2018-10-22 DIAGNOSIS — I130 Hypertensive heart and chronic kidney disease with heart failure and stage 1 through stage 4 chronic kidney disease, or unspecified chronic kidney disease: Secondary | ICD-10-CM | POA: Diagnosis present

## 2018-10-22 DIAGNOSIS — Z888 Allergy status to other drugs, medicaments and biological substances status: Secondary | ICD-10-CM | POA: Diagnosis not present

## 2018-10-22 DIAGNOSIS — J9601 Acute respiratory failure with hypoxia: Secondary | ICD-10-CM

## 2018-10-22 DIAGNOSIS — N179 Acute kidney failure, unspecified: Secondary | ICD-10-CM | POA: Diagnosis present

## 2018-10-22 DIAGNOSIS — F329 Major depressive disorder, single episode, unspecified: Secondary | ICD-10-CM | POA: Diagnosis present

## 2018-10-22 DIAGNOSIS — I10 Essential (primary) hypertension: Secondary | ICD-10-CM | POA: Diagnosis not present

## 2018-10-22 DIAGNOSIS — G4700 Insomnia, unspecified: Secondary | ICD-10-CM | POA: Diagnosis present

## 2018-10-22 DIAGNOSIS — R55 Syncope and collapse: Secondary | ICD-10-CM | POA: Diagnosis not present

## 2018-10-22 DIAGNOSIS — N183 Chronic kidney disease, stage 3 (moderate): Secondary | ICD-10-CM | POA: Diagnosis present

## 2018-10-22 LAB — COMPREHENSIVE METABOLIC PANEL
ALT: 8 U/L (ref 0–44)
AST: 34 U/L (ref 15–41)
Albumin: 3.8 g/dL (ref 3.5–5.0)
Alkaline Phosphatase: 62 U/L (ref 38–126)
Anion gap: 14 (ref 5–15)
BUN: 21 mg/dL (ref 8–23)
CO2: 24 mmol/L (ref 22–32)
Calcium: 9.3 mg/dL (ref 8.9–10.3)
Chloride: 103 mmol/L (ref 98–111)
Creatinine, Ser: 1.36 mg/dL — ABNORMAL HIGH (ref 0.61–1.24)
GFR calc Af Amer: 52 mL/min — ABNORMAL LOW (ref >60)
GFR calc non Af Amer: 45 mL/min — ABNORMAL LOW (ref >60)
Glucose, Bld: 95 mg/dL (ref 70–99)
Potassium: 3.6 mmol/L (ref 3.5–5.1)
Sodium: 141 mmol/L (ref 135–145)
Total Bilirubin: 1.8 mg/dL — ABNORMAL HIGH (ref 0.3–1.2)
Total Protein: 6.6 g/dL (ref 6.5–8.1)

## 2018-10-22 LAB — CBC
HCT: 41.8 % (ref 39.0–52.0)
Hemoglobin: 14 g/dL (ref 13.0–17.0)
MCH: 31.8 pg (ref 26.0–34.0)
MCHC: 33.5 g/dL (ref 30.0–36.0)
MCV: 95 fL (ref 80.0–100.0)
Platelets: 94 10*3/uL — ABNORMAL LOW (ref 150–400)
RBC: 4.4 MIL/uL (ref 4.22–5.81)
RDW: 15.9 % — ABNORMAL HIGH (ref 11.5–15.5)
WBC: 7.1 10*3/uL (ref 4.0–10.5)
nRBC: 0 % (ref 0.0–0.2)

## 2018-10-22 LAB — TROPONIN I (HIGH SENSITIVITY)
Troponin I (High Sensitivity): 38 ng/L — ABNORMAL HIGH (ref <18)
Troponin I (High Sensitivity): 37 ng/L — ABNORMAL HIGH (ref <18)

## 2018-10-22 LAB — MAGNESIUM: Magnesium: 1.8 mg/dL (ref 1.7–2.4)

## 2018-10-22 LAB — ECHOCARDIOGRAM COMPLETE
Height: 65 in
Weight: 2462.1 oz

## 2018-10-22 LAB — SARS CORONAVIRUS 2 (TAT 6-24 HRS): SARS Coronavirus 2: NEGATIVE

## 2018-10-22 LAB — TSH: TSH: 3.236 u[IU]/mL (ref 0.350–4.500)

## 2018-10-22 LAB — MRSA PCR SCREENING: MRSA by PCR: NEGATIVE

## 2018-10-22 MED ORDER — HYDRALAZINE HCL 20 MG/ML IJ SOLN
2.0000 mg | Freq: Four times a day (QID) | INTRAMUSCULAR | Status: DC | PRN
  Administered 2018-10-23: 2 mg via INTRAVENOUS
  Filled 2018-10-22: qty 1

## 2018-10-22 MED ORDER — SACCHAROMYCES BOULARDII 250 MG PO CAPS
250.0000 mg | ORAL_CAPSULE | Freq: Every day | ORAL | Status: DC
  Administered 2018-10-23 – 2018-10-25 (×3): 250 mg via ORAL
  Filled 2018-10-22 (×3): qty 1

## 2018-10-22 MED ORDER — PAROXETINE HCL 10 MG PO TABS
5.0000 mg | ORAL_TABLET | Freq: Every day | ORAL | Status: DC
  Administered 2018-10-23 – 2018-10-25 (×3): 5 mg via ORAL
  Filled 2018-10-22 (×4): qty 0.5

## 2018-10-22 MED ORDER — LEVALBUTEROL HCL 0.63 MG/3ML IN NEBU: 0.6300 mg | INHALATION_SOLUTION | Freq: Four times a day (QID) | RESPIRATORY_TRACT | Status: DC | PRN

## 2018-10-22 MED ORDER — TECHNETIUM TO 99M ALBUMIN AGGREGATED
1.5300 | Freq: Once | INTRAVENOUS | Status: AC | PRN
  Administered 2018-10-22: 1.53 via INTRAVENOUS

## 2018-10-22 MED ORDER — ORAL CARE MOUTH RINSE
15.0000 mL | Freq: Two times a day (BID) | OROMUCOSAL | Status: DC
  Administered 2018-10-22 – 2018-10-26 (×5): 15 mL via OROMUCOSAL

## 2018-10-22 NOTE — Evaluation (Signed)
Physical Therapy Evaluation Patient Details Name: Henry Alvarez MRN: [id number] DOB: 03/23/1924 Today's Date: 10/22/2018   History of Present Illness  Patient is a 85 y/o male who was admitted due to syncopal episode. BNP elevated at 1289 consistent with acute diastolic CHF. Workup pending. PMH includes PD, Depression, dementia, CAD, Pafib, HTN, TIA.  Clinical Impression  Patient presents with generalized weakness, dypsnea on exertion, decreased endurance, decreased activity tolerance, impaired balance and impaired mobility s/p above. Pt lives with son and daughter and is Mod I with ADLs and uses RW for ambulation PTA. Has caregiver come in for a few hours/day to assist with IADLs. Today, pt requires Mod A for bed mobility, transfers and Min A for ambulation with use of RW. Sp02 dropped to mid 80s on RA, donned 2L and able to maintain in 90s. Weaned down to 1L/min 02 post session. RN notified. HR ranged from 100-125 bpm; pt with 3/4 DOE. If family able to provide necessary assist at home, recommend home with 24/7 Supervision for OOB mobility and HHPT. If not, pt may need SNF. Will follow acutely to maximize independence and mobility prior to return home.     Follow Up Recommendations Home health PT;Supervision for mobility/OOB;Supervision/Assistance - 24 hour    Equipment Recommendations  None recommended by PT    Recommendations for Other Services OT consult     Precautions / Restrictions Precautions Precautions: Fall Precaution Comments: watch HR and 02 Restrictions Weight Bearing Restrictions: No      Mobility  Bed Mobility Overal bed mobility: Needs Assistance Bed Mobility: Supine to Sit     Supine to sit: Mod assist;HOB elevated     General bed mobility comments: Good initiation of bringing LEs to EOB, needed assist with getting them off bed and scooting bottom to get feet on floor. Increased time and effort. No dizziness.  Transfers Overall transfer level: Needs  assistance Equipment used: Rolling walker (2 wheeled) Transfers: Sit to/from Stand Sit to Stand: Mod assist;From elevated surface         General transfer comment: Assist to power to standing with use of momentum, posterior lean initially but able to self correct with time. Stood from [employer], transferred to chair post ambulation.  Ambulation/Gait Ambulation/Gait assistance: Min assist Gait Distance (Feet): 20 Feet Assistive device: Rolling walker (2 wheeled) Gait Pattern/deviations: Step-through pattern;Decreased stride length;Trunk flexed;Shuffle Gait velocity: decreased   General Gait Details: Slow, mildly unsteady gait with RW for support; 3/4 DOE. Sp02 dropped to mid 80s on RA, donned 2L and remained in 90s. HR ranged from 100-125 bpm.  Stairs            Wheelchair Mobility    Modified Rankin (Stroke Patients Only)       Balance Overall balance assessment: Needs assistance Sitting-balance support: Feet supported;No upper extremity supported Sitting balance-Leahy Scale: Fair     Standing balance support: During functional activity Standing balance-Leahy Scale: Poor Standing balance comment: Requires UE support in standing, physical assist for dynamic tasks/walking                             Pertinent Vitals/Pain Pain Assessment: No/denies pain    Home Living Family/patient expects to be discharged to:: Private residence Living Arrangements: Children(son and daughter) Available Help at Discharge: Personal care attendant(3-4 hrs per day 3-4 days/week) Type of Home: House Home Access: Stairs to enter Entrance Stairs-Rails: Right Entrance Stairs-Number of Steps: 2 Home Layout: Two level;Bed/bath  upstairs Home Equipment: Walker - 2 wheels;Shower seat      Prior Function Level of Independence: Needs assistance   Gait / Transfers Assistance Needed: Uses RW for ambulation.  ADL's / Homemaking Assistance Needed: PCA assists with IADLs. Reports  doing his own ADls.  Comments: DOes own ADLs and walks with RW.     Hand Dominance        Extremity/Trunk Assessment   Upper Extremity Assessment Upper Extremity Assessment: Defer to OT evaluation    Lower Extremity Assessment Lower Extremity Assessment: Generalized weakness    Cervical / Trunk Assessment Cervical / Trunk Assessment: Kyphotic  Communication   Communication: HOH  Cognition Arousal/Alertness: Awake/alert Behavior During Therapy: WFL for tasks assessed/performed Overall Cognitive Status: No family/caregiver present to determine baseline cognitive functioning                                 General Comments: Pt repeating self asking what test he went down for, after being told twice by this PT. Upset that no one has been in his room. Per son, normally would know the date. When asked which month it was, pt states "5"      General Comments General comments (skin integrity, edema, etc.): Son present towards end of session and states his dad is normally not confused and could tell you the correct date.    Exercises     Assessment/Plan    PT Assessment Patient needs continued PT services  PT Problem List Decreased strength;Decreased mobility;Decreased cognition;Cardiopulmonary status limiting activity;Decreased activity tolerance;Decreased balance       PT Treatment Interventions Therapeutic activities;Gait training;Therapeutic exercise;Patient/family education;Balance training;Functional mobility training;Cognitive remediation    PT Goals (Current goals can be found in the Care Plan section)  Acute Rehab PT Goals Patient Stated Goal: to get out of here PT Goal Formulation: With patient Time For Goal Achievement: 11/05/18 Potential to Achieve Goals: Fair    Frequency Min 3X/week   Barriers to discharge        Co-evaluation               AM-PAC PT "6 Clicks" Mobility  Outcome Measure Help needed turning from your back to your  side while in a flat bed without using bedrails?: A Little Help needed moving from lying on your back to sitting on the side of a flat bed without using bedrails?: A Lot Help needed moving to and from a bed to a chair (including a wheelchair)?: A Lot Help needed standing up from a chair using your arms (e.g., wheelchair or bedside chair)?: A Lot Help needed to walk in hospital room?: A Little Help needed climbing 3-5 steps with a railing? : A Lot 6 Click Score: 14    End of Session Equipment Utilized During Treatment: Gait belt;Oxygen Activity Tolerance: Patient limited by fatigue Patient left: in chair;with call bell/phone within reach;with family/visitor present Nurse Communication: Mobility status;Other (comment) PT Visit Diagnosis: Unsteadiness on feet (R26.81);Muscle weakness (generalized) (M62.81);Difficulty in walking, not elsewhere classified (R26.2)    Time: [id number] PT Time Calculation (min) (ACUTE ONLY): 30 min   Charges:   PT Evaluation $PT Eval Moderate Complexity: 1 Mod PT Treatments $Therapeutic Activity: 8-22 mins        Wray Kearns, PT, DPT Acute Rehabilitation Services Pager [phone] Office [phone]      Marguarite Arbour A Sabra Heck 10/22/2018, 3:57 PM

## 2018-10-22 NOTE — Consult Note (Signed)
Cardiology Consultation:   Patient ID: Henry Alvarez; AB:[id number]; 1924/05/21   Admit date: 10/21/2018 Date of Consult: 10/22/2018  Primary Care Provider: Reynold Bowen, MD Primary Cardiologist: Dr. Peter Martinique, MD  (remote 2016)  Patient Profile:   Henry Alvarez is a 85 y.o. male with a hx of tachybradycardia syndrome with paroxysmal atrial fibrillation on Eliquis, coronary disease s/p PCI of the LAD in 2009, HTN, HLD, hx of TIA, Parkinsons followed by neurology and peripheral vascular disease who is being seen today for the evaluation of CHF and near syncope at the request of Dr. Algis Liming.  History of Present Illness:   Henry Alvarez is a 85yo M with a hx as stated above who presented to [facility] on 10/21/2018 with near syncope, tremors and SOB. Per chart review and RN report, patient was at home and was getting up from his chair to go outside when he experienced a near syncopal event. During the episode he was noted to have had tremors and some dyspnea. Son reports he has had multiple episodes in the last several weeks. Cardiology was asked to evaluate for possible cardiac causes of his near syncopal event.  On ED arrival, he was neurologically back to his baseline. CXR with no acute pulmonary changes. COVID 19 negative. EKG with atrial fibrillation with HR at 9bpm and no acute changes. His BNP was elevated on arrival at 1289.  HsT at 62, 37>>>not consistent with ACS. TSH was within normal limits. He was given once dose of IV Lasix 40mg  with good response. His creatinine was noted to be 1.3 however appears to be at his most recent baseline. D-dimer was mildly elevated at 0.73 in which a VQ scan was ordered however has not yet been completed. Head CT on arrival with no acute changes with atrophy and small vessel ischemic change of the white matter. Last echocardiogram performed 11/13/2017 with normal LV function and no regional wall motion abnormalities. There was severe focal thickening and  calcification involving the right coronary cusp, mild aortic stenosis and moderate regurgitation with a mean gradient of 8 mmHg. No other significant valvular disease.  Also noted to have increased right ventricular systolic pressure consistent with mild pulmonary hypertension.  He was admitted to hospitalist service with cardiology consultation for presumed CHF exacerbation and possible cardiac concern for near syncopal episode. He was seen by neurology in the OP setting and was started on cyclobenzaprine 10/11/2018 which appears to be the only recent new medication.  Henry Alvarez denies chest pain, shortness of breath, dizziness, LE swelling, orthopnea, nausea, vomiting or diaphoresis.  He was noted to be orthostatic in the emergency department however repeat reading here on the floor do not show significant orthostatic drops. LYING- 145/88 HR 84; SITTING-136/118 HR 97; STANDING 138/126 HR 80. Given his history of prior tachybradycardia syndrome, cardiology was asked to evaluate.  EEG will be obtained secondary to reports of tremors and confusion after initial episode.  He was last seen by our service during routine follow-up for atrial fibrillation and CAD on 06/03/2014.  At that time he was noted to have had a hospitalization 09/2013 for an episode of rapid atrial fibrillation with rates in the 150s.  With medical therapy he converted to normal sinus rhythm with marked sinus bradycardia.  He was seen by EP, Dr. Rayann Heman, in which a pacemaker was considered however this was eventually deferred. Was previously noted to be on Coumadin therapy and most recently has been on low-dose Eliquis.   Past  Medical History:  Diagnosis Date   Anemia    Arthritis    Atrial fibrillation (HCC)    BPH (benign prostatic hyperplasia)    C. difficile colitis    hx of cdiff last fall 2016    Dementia ([place])    Depression    Hypertension    Iron deficiency anemia    Parkinson disease (HCC)    Thrombocytopenia  ([place])     Past Surgical History:  Procedure Laterality Date   CARDIAC CATHETERIZATION  10/30/2005   Est. EF 65% -- Single-vessel obstructive atherosclerotic coronary artery disease -- Normal left ventricular function -- Peter M. Martinique, M.D.   CARPAL TUNNEL RELEASE Left    CORONARY ANGIOPLASTY WITH STENT PLACEMENT  11/02/2005   intracoronary stenting of the proximal left anterior descending artery --  Peter M. Martinique, M.D.   CYSTOSCOPY  12/21/2010   Procedure: CYSTOSCOPY;  Surgeon: Molli Hazard, MD;  Location: [facility];  Service: Urology;  Laterality: N/A;   JOINT REPLACEMENT     right   KYPHOSIS SURGERY  09/2010   LARYNGOSCOPY N/A 08/26/2012   Procedure: DIRECT LARYNGOSCOPY WITH RADIESSE INJECTIONS ;  Surgeon: Melida Quitter, MD;  Location: [facility];  Service: ENT;  Laterality: N/A;  direct laryngoscopy with radiesse injections   TOTAL KNEE ARTHROPLASTY     right   TOTAL KNEE ARTHROPLASTY  08/07/2011   Procedure: TOTAL KNEE ARTHROPLASTY;  Surgeon: Gearlean Alf, MD;  Location: [facility];  Service: Orthopedics;  Laterality: Left;   TRANSURETHRAL RESECTION OF PROSTATE  12/2010     Prior to Admission medications   Medication Sig Start Date End Date Taking? Authorizing Provider  carbidopa-levodopa (SINEMET CR) 50-200 MG tablet Take 1 tablet by mouth at bedtime.   Yes [provider]  carbidopa-levodopa (SINEMET IR) 25-100 MG tablet TAKE 2 TABS AT 8AM, 12 PM, 4 PM AND 8 PM Patient taking differently: Take 2 tablets by mouth 4 (four) times daily.  08/12/18  Yes Jaffe, Adam R, DO  cyclobenzaprine (FLEXERIL) 5 MG tablet Take 1 tablet (5 mg total) by mouth at bedtime. 10/11/18  Yes Jaffe, Adam R, DO  donepezil (ARICEPT) 10 MG tablet Take 1 tablet (10 mg total) by mouth at bedtime. 09/11/18  Yes Jaffe, Adam R, DO  ELIQUIS 2.5 MG TABS tablet Take 2.5 mg by mouth 2 (two) times daily. 02/19/15  Yes [provider]  iron polysaccharides (NIFEREX) 150 MG capsule Take 150 mg by  mouth every other day.    Yes [provider]  latanoprost (XALATAN) 0.005 % ophthalmic solution Place 1 drop into both eyes at bedtime.  04/20/14  Yes [provider]  Melatonin 5 MG CAPS Take 5 mg by mouth at bedtime.   Yes [provider]  Multiple Vitamin (MULTIVITAMIN) tablet Take 1 tablet by mouth daily.   Yes [provider]  PARoxetine (PAXIL) 10 MG tablet TAKE 1 TABLET ONCE DAILY. Patient taking differently: Take 5 mg by mouth at bedtime.  09/18/18  Yes Pieter Partridge, DO  Saccharomyces boulardii (FLORASTOR PO) Take 1 capsule by mouth daily.    Yes [provider]  tamsulosin (FLOMAX) 0.4 MG CAPS capsule Take 0.4 mg by mouth every evening.  09/21/14  Yes [provider]  traMADol-acetaminophen (ULTRACET) 37.5-325 MG tablet Take 1 tablet by mouth at bedtime.    Yes [provider]  ipratropium-albuterol (DUONEB) 0.5-2.5 (3) MG/3ML SOLN Take 3 mLs by nebulization every 6 (six) hours as needed. Patient not taking: Reported  on 10/11/2018 03/29/15   Elgergawy, Silver Huguenin, MD    Inpatient Medications: Scheduled Meds:  apixaban  2.5 mg Oral BID   carbidopa-levodopa  1 tablet Oral QHS   carbidopa-levodopa  2 tablet Oral QID   donepezil  10 mg Oral QHS   iron polysaccharides  150 mg Oral Daily   latanoprost  1 drop Both Eyes QHS   mouth rinse  15 mL Mouth Rinse BID   Melatonin  3 mg Oral QHS   multivitamin with minerals  1 tablet Oral Daily   PARoxetine  10 mg Oral Daily   polyethylene glycol  17 g Oral Daily   tamsulosin  0.4 mg Oral Daily   Continuous Infusions:  PRN Meds: acetaminophen **OR** acetaminophen, levalbuterol, ondansetron **OR** ondansetron (ZOFRAN) IV, traMADol-acetaminophen  Allergies:    Allergies  Allergen Reactions   Quinolones Other (See Comments)    Weakness, painful joints, lethergy    Social History:   Social History   Socioeconomic History   Marital status: Widowed    Spouse name:  Not on file   Number of children: 2   Years of education: Not on file   Highest education level: Not on file  Occupational History   Occupation: Retired Warehouse manager: [employer] resource strain: Not on file   Food insecurity    Worry: Not on file    Inability: Not on file   Transportation needs    Medical: Not on file    Non-medical: Not on file  Tobacco Use   Smoking status: Never Smoker   Smokeless tobacco: Never Used  Substance and Sexual Activity   Alcohol use: No    Alcohol/week: 0.0 standard drinks    Comment: 09/05/2012 "rarely; I had a glass of wine ~ 1 month ago"   Drug use: No   Sexual activity: Not Currently  Lifestyle   Physical activity    Days per week: Not on file    Minutes per session: Not on file   Stress: Not on file  Relationships   Social connections    Talks on phone: Not on file    Gets together: Not on file    Attends religious service: Not on file    Active member of club or organization: Not on file    Attends meetings of clubs or organizations: Not on file    Relationship status: Not on file   Intimate partner violence    Fear of current or ex partner: Not on file    Emotionally abused: Not on file    Physically abused: Not on file    Forced sexual activity: Not on file  Other Topics Concern   Not on file  Social History Narrative   Two level lives with family; Right handed; iced tea 1 glass / day    Family History:   Family History  Problem Relation Age of Onset   Heart attack Father    Angina Father    Heart failure Mother    Hypertension Sister    Heart disease Other        grandmother   Colon cancer Neg Hx    Esophageal cancer Neg Hx    Kidney disease Neg Hx    Liver disease Neg Hx    Family Status:  Family Status  Relation Name Status   Father  Deceased at age 22       heart attack   Mother  Deceased  at age 61       old age   Sister  (Not Specified)    Other  (Not Specified)   Neg Hx  (Not Specified)    ROS:  Please see the history of present illness.  All other ROS reviewed and negative.     Physical Exam/Data:   Vitals:   10/22/18 0400 10/22/18 0529 10/22/18 0820 10/22/18 1039  BP: (!) 153/97 (!) 165/135 (!) 145/88 (!) 120/108  Pulse: 88 (!) 123 84 62  Resp: 15 19 10    Temp:  98.8 F (37.1 C) 98.4 F (36.9 C) 97.8 F (36.6 C)  TempSrc:  Oral Axillary Axillary  SpO2: 99% 97% 98% 98%  Weight:  69.8 kg    Height:  5\' 5"  (1.651 m)      Intake/Output Summary (Last 24 hours) at 10/22/2018 1140 Last data filed at 10/22/2018 0900 Gross per 24 hour  Intake 240 ml  Output --  Net 240 ml   Filed Weights   10/21/18 1544 10/22/18 0529  Weight: 73 kg 69.8 kg   Body mass index is 25.61 kg/m.    General: Elderly, NAD Neck: Negative for carotid bruits. No JVD Lungs:Clear to ausculation bilaterally. No wheezes Cardiovascular: Irregularly irregular with S1 S2. No murmur Abdomen: Soft, non-tender, non-distended. No obvious abdominal masses. Extremities: No edema. No clubbing or cyanosis. DP pulses 1+ bilaterally Neuro: Alert, confused to situation. No focal deficits. No facial asymmetry. MAE spontaneously. Psych: Responds to questions somewhat appropriately with normal affect.     EKG:  The EKG was personally reviewed and demonstrates: 10/21/2018 atrial fibrillation with HR 91 bpm, nonspecific T wave abnormalities.  Baseline EKG from 12/06/2014 with sinus bradycardia Telemetry:  Telemetry was personally reviewed and demonstrates: 10/22/2018 atrial fibrillation with HR 90s to 110s  Relevant CV Studies:  Echocardiogram 11/13/2017:  Study Conclusions  - Left ventricle: The cavity size was normal. There was severe   focal basal and moderate concentric hypertrophy. Systolic   function was normal. The estimated ejection fraction was in the   range of 50% to 55%. Wall motion was normal; there were no   regional wall motion  abnormalities. The study was not technically   sufficient to allow evaluation of LV diastolic dysfunction due to   atrial fibrillation. - Aortic valve: Severe focal thickening and calcification involving   the right coronary cusp. Right coronary cusp immobility was   noted. There was mild stenosis. There was moderate regurgitation.   Mean gradient (S): 8 mm Hg. Regurgitation pressure half-time: 402   ms. - Aorta: Ascending aortic diameter: 40 mm (S). - Ascending aorta: The ascending aorta was mildly dilated. - Mitral valve: Calcified annulus. There was mild regurgitation. - Left atrium: The atrium was severely dilated. - Tricuspid valve: There was mild regurgitation. - Pulmonic valve: There was mild regurgitation. - Pulmonary arteries: PA peak pressure: 37 mm Hg (S).  Impressions:  - The right ventricular systolic pressure was increased consistent   with mild pulmonary hypertension.  Cardiac catheterization 11/03/2005:  We proceeded with intracoronary stenting of the proximal LAD.  This lesion  was crossed easily with the wire.  We primarily stented the lesion using a  3.0 x 12 mm Liberte stent.  This was deployed at 9 atmospheres and then  postdilated to 14 atmospheres with the stent balloon.  We then exchanged for  a 3.0 x 12 mm Quantum balloon and postdilated to 16 atmospheres.  This  yielded an excellent angiographic result with 0% residual  stenosis and TIMI  grade 3 flow.  The patient was pain-free at the end of procedure.   FINAL INTERPRETATION:  Successful intracoronary stenting of the proximal  left anterior descending artery.  Laboratory Data:  Chemistry Recent Labs  Lab 10/21/18 1700 10/22/18 0753  NA 139 141  K 4.1 3.6  CL 105 103  CO2 25 24  GLUCOSE 107* 95  BUN 24* 21  CREATININE 1.33* 1.36*  CALCIUM 9.1 9.3  GFRNONAA 46* 45*  GFRAA 53* 52*  ANIONGAP 9 14    Total Protein  Date Value Ref Range Status  10/22/2018 6.6 6.5 - 8.1 g/dL Final    Albumin  Date Value Ref Range Status  10/22/2018 3.8 3.5 - 5.0 g/dL Final   AST  Date Value Ref Range Status  10/22/2018 34 15 - 41 U/L Final   ALT  Date Value Ref Range Status  10/22/2018 8 0 - 44 U/L Final   Alkaline Phosphatase  Date Value Ref Range Status  10/22/2018 62 38 - 126 U/L Final   Total Bilirubin  Date Value Ref Range Status  10/22/2018 1.8 (H) 0.3 - 1.2 mg/dL Final   Hematology Recent Labs  Lab 10/21/18 1700 10/22/18 0753  WBC 5.3 7.1  RBC 4.02* 4.40  HGB 12.8* 14.0  HCT 39.1 41.8  MCV 97.3 95.0  MCH 31.8 31.8  MCHC 32.7 33.5  RDW 15.9* 15.9*  PLT 95* 94*   Cardiac EnzymesNo results for input(s): TROPONINI in the last 168 hours. No results for input(s): TROPIPOC in the last 168 hours.  BNP Recent Labs  Lab 10/21/18 1700  BNP 1,289.8*    DDimer  Recent Labs  Lab 10/21/18 1700  DDIMER 0.73*   TSH:  Lab Results  Component Value Date   TSH 3.236 10/22/2018   Lipids: Lab Results  Component Value Date   CHOL 180 09/06/2012   HDL 65 09/06/2012   LDLCALC 108 (H) 09/06/2012   TRIG 37 09/06/2012   CHOLHDL 2.8 09/06/2012   HgbA1c:No results found for: HGBA1C  Radiology/Studies:  Ct Head Wo Contrast  Result Date: 10/21/2018 CLINICAL DATA:  Altered LOC EXAM: CT HEAD WITHOUT CONTRAST TECHNIQUE: Contiguous axial images were obtained from the base of the skull through the vertex without intravenous contrast. COMPARISON:  CT brain 03/26/2015 FINDINGS: Brain: Motion degradation over the posterior fossa and inferior brain. No acute consolidation or pleural effusion. Prominent atrophy. Moderate hypodensity in the white matter consistent with small vessel ischemic change. Stable ventricle size. Vascular: No hyperdense vessels.  Carotid vascular calcification Skull: No fracture Sinuses/Orbits: Mucosal thickening in the maxillary and ethmoid sinuses Other: None IMPRESSION: 1. Mild motion degraded study 2. No definite CT evidence for acute intracranial  abnormality 3. Atrophy and small vessel ischemic change of white matter Electronically Signed   By: Donavan Foil M.D.   On: 10/21/2018 18:28   Dg Chest Port 1 View  Result Date: 10/21/2018 CLINICAL DATA:  Per pt's son: they were placing pt in passenger seat of car Today and while driving pt begun to look blank in the face and drooling and unable to response when spoken to. Son called EMS because he noted that he was having increased tremors which concerned the son as possible seizure activity. Pt w/ hx of parkinsonian tremors, son reports today's episode was just more severe. EMS reports pt is in afib during transport, which he has a remote hx of, been off meds for it for over 10 years and has not had recurrent  episode. Per pt no chest pain but SOB during exam. Hx of A-fib, HTN. EXAM: PORTABLE CHEST 1 VIEW COMPARISON:  06/21/2018 FINDINGS: Cardiac silhouette is mildly enlarged. No mediastinal or hilar masses. Lungs demonstrate stable prominent bronchovascular markings. No evidence of pneumonia or pulmonary edema. No pleural effusion or pneumothorax. Skeletal structures are demineralized. Prior vertebroplasty has been performed in the lower thoracic and lumbar spine. IMPRESSION: No acute cardiopulmonary disease. Electronically Signed   By: Lajean Manes M.D.   On: 10/21/2018 17:05   Assessment and Plan:   1.  Paroxysmal atrial fibrillation with RVR : -Has a long history of tachybradycardia syndrome and paroxysmal atrial fibrillation for which he has been well maintained on anticoagulation with low-dose Eliquis.  Not on AV nodal blocking agents secondary to history of bradycardia in the postconversion setting dating back to 2015.  On ED arrival, BNP noted to be elevated likely in the setting of atrial fibrillation with RVR with rates in the 90s. -Rates more stable now per telemetry review, no arrhythmias noted -TSH within normal limits -CXR with no acute cardiopulmonary disease -Need to watch for now,  consider adding low-dose diltiazem given stable LV function on last echocardiogram -We will repeat echocardiogram during this admission  2.  Acute on chronic diastolic CHF: -Last echocardiogram from 2019 with normal LV function with severe focal thickening and calcification involving the right coronary cusp, mild aortic stenosis and moderate regurgitation with a mean gradient of 8 mmHg -Likely acute volume overload in the setting of atrial fibrillation with RVR -Does not appear to be overtly fluid volume overloaded on exam.  -May continue with one more dose of IV Lasix 40mg  for now and re-assess physical exam findings  -Creatinine elevated however appears to be at most recent baseline  3. Near syncopal episode: -Unclear etiology. Per chart review and RN report, patient was at home and was getting up from his chair to go outside when he experienced a near syncopal event. During the episode he was noted to have had tremors and some dyspnea. Son reports he has had multiple episodes in the last several weeks. Cardiology was asked to evaluate for possible cardiac causes of his near syncopal event. -No arrhthymias on monitor  -LYING- 145/88 HR 84; SITTING-136/118 HR 97; STANDING 138/126 HR 80  4.  History of CAD: -Remote history of PCI to LAD in 2009 with no recurrent ischemic cardiac issues since that time -EKG with atrial fibrillation, HR 91 bpm with no ischemic changes -Denies anginal pain -No ASA in the setting of low-dose Eliquis -Not on home statin -No beta-blocker in the setting of history of bradycardia in the postconversion setting  5.  HLD: -Last LDL, 108 on 09/06/2012 -We will repeat lab work today for secondary risk modification  -Management per PCP, internal medicine  6.  History of /tachycardia/bradycardia syndrome: -Was previously evaluated for bradycardia in the post conversion setting in 2015 with heart rates in the 30 to 40 bpm range.  EP evaluated at that time and initially  thought to need PPM implantation however this was eventually deferred given stabilization. -AV nodal blocking agents have been avoided since that time -No arrhthymias on telemetry review today  -Continue to monitor closely   -Electrolyes WNL  -Mg+ 1.8 today  7. Dementia with Parkinson's Disease: -Had initial confusion prior to ED arrival in the setting of near syncopal episode.  -Has baseline dementia>>on Aricept, Sinemet -Was recently started on Flexeril 5 mg p.o. at bedtime for sleep by PCP on 10/11/2018.  Difficult to determine if this is the cause of near syncope. Also noted to be orthostatic in the ED however I cannot find these vitals in epic.  -Needs swallow evaluation    For questions or updates, please contact [place] Please consult [email or web address] for contact info under Cardiology/STEMI.   SignedKathyrn Drown NP-C [facility] Pager: [phone] 10/22/2018 11:40 AM

## 2018-10-22 NOTE — Progress Notes (Addendum)
PROGRESS NOTE   Henry Alvarez  [id number]    DOB: 1924/10/21    DOA: 10/21/2018  PCP: Reynold Bowen, MD   I have briefly reviewed patients previous medical records in [facility].  Chief Complaint  Patient presents with   Tremors   Pre Syncope    Brief Narrative:  85 year old male, reportedly lives with son and family, independent, PMH of PAF on Eliquis, tachybradycardia syndrome, CAD status post PCI, TIA, HTN, HLD, idiopathic Parkinson's disease, insomnia for which she was newly started on cyclobenzaprine during OP neurology follow-up 9/4, iron deficiency anemia, dementia, chronic thrombocytopenia, presented to ED on 9/14 due to syncope or near syncope, tremors and dyspnea.  In ED, mildly hypoxic, EKG with A. fib in the 90s, BNP 1289, CT head negative, mildly elevated d-dimer, VQ scan ordered and pending, treated with a dose of IV Lasix with good diuresis.  Noted some swallowing difficulty, wheezing on exam, ST consulted.  Cardiology consulted to evaluate possible cardiac causes for syncope, A. fib with RVR this morning and suspect acute diastolic CHF.   Assessment & Plan:   Principal Problem:   Acute CHF (congestive heart failure) (HCC) Active Problems:   Paroxysmal atrial fibrillation (HCC)   Long term current use of anticoagulant therapy   Parkinson's disease (HCC)   BPH (benign prostatic hyperplasia)   Syncope   Acute diastolic CHF (congestive heart failure) (HCC)   Acute diastolic CHF  May have been precipitated by A. fib with RVR.  TTE October 2019: LVEF 50-55% and unable to evaluate diastolic function due to A. fib.  S/p Lasix 40 mg IV x1 dose in the ED.  Weight down by approximately 7 pounds?  Accuracy.  Intake output does not seem to be accurate.  Volume status better but may still be slightly volume overloaded.  Await Cardiology input regarding further diuresis.  Has some wheezing, reported history of chronic dyspnea,?  Aspiration issues due to  dysphagia, ST consulted.  PAF with RVR/history of tachybradycardia syndrome  Noted A. fib with RVR up to 140s-150s this morning.  Per cardiology office note from 2016, history of tachybradycardia syndrome and avoiding AVN blocking agents.  Currently asymptomatic.  Cardiology consulted and await their input.  Continue apixaban.  Syncope/near syncope  Etiology unclear.  DD: Orthostatic hypotension, cardiac arrhythmias/tachybradycardia, seizure seem less likely, versus other etiologies  Telemetry shows A. fib with RVR but no bradycardia arrhythmias at this time.  Continue telemetry.  TTE 10/8: LVEF 50-55% with severe focal basal and moderate concentric LV hypertrophy, no wall motion abnormalities, mild aortic stenosis and moderate AR.  Orthostatic blood pressures this morning negative.  Orthostatic blood pressure checks in the ED are inconclusive.  Follow EEG.  CT head negative for acute findings.  Patient states that he does not drive.  PT evaluation.  Acute respiratory failure with hypoxia  Suspect due to decompensated CHF.  Although chest x-ray showed no acute findings, BNP was elevated and clinically appeared volume overloaded.  Could also be silently aspirating  Also with some clinical bronchospasm.  Repeat chest x-ray.  Follow VQ scan.  SARS coronavirus 2 testing negative.  Xopenex nebulizations.  Not sure if he will cooperate with incentive spirometry and flutter valve.  Suspected dysphagia   Patient reports that he chokes on food sometimes.  Noted difficulty with breakfast this morning.  Changed diet to soft diet pending ST evaluation.  Parkinson's disease  His tremors may be related to this and worsened related to overall deconditioning, possible orthostatic  worsening.  No current tremors.  Follows with Dr. Metta Clines, [facility], last seen on 9/4.  Continue Sinemet at home dose.  Due to concern for worsening tremor/mental status changes,  holding Flexeril and Ultracet.  BPH  Continue tamsulosin.  Chronic thrombocytopenia  Stable.  Stage III chronic kidney disease  Stable and creatinine likely at baseline.  CAD status post PCI  No anginal symptoms.  Minimally elevated troponin, flat trend likely due to CKD and demand ischemia.  Not on aspirin due to being on Eliquis.  No beta-blockers due to history of tachybradycardia syndrome.  Not on statins likely due to advanced age.  Insomnia  Will be difficult to treat.  Continue melatonin.  Discontinued recently started Flexeril.  Dementia/depression  Has moderate cognitive impairment.  No agitation noted.  Continue Aricept and Paxil.  Essential hypertension  Mildly uncontrolled at times.  Not on antihypertensives PTA.  May also be difficult to treat given periodic orthostatic symptoms and blood pressures.  PRN IV hydralazine for now.  Adult failure to thrive  Due to very advanced age, overall physical frailty and deconditioning, multiple severe significant comorbidities.    PMT consulted for goals of care.   DVT prophylaxis: Apixaban Code Status: Full Family Communication: Unable to reach patient's son via phone, left VM message. Disposition: To be determined pending clinical improvement.  Patient was admitted under observation status.  However given A. fib with RVR with need for cardiology input, dysphagia with need for ST input, hypoxia, will need further close inpatient evaluation and management which is going to cross second midnight tonight.  Thereby changed to inpatient status.   Consultants:  Cardiology  Procedures:  None  Antimicrobials:  None   Subjective: Patient seen sitting up eating breakfast by himself.  Having hard time chewing on food, at times pocketing food and having hard time clearing his throat.  Denies dyspnea or chest pain.  States that he nearly passed out when he was getting in the car to go see his doctor yesterday.  States  that he has passed out once before a couple of months ago.  Overall poor historian.  Denies home oxygen use.  Claims to be independent and does not use a cane or a walker.  Objective:  Vitals:   10/22/18 0400 10/22/18 0529 10/22/18 0820 10/22/18 1039  BP: (!) 153/97 (!) 165/135 (!) 145/88 (!) 120/108  Pulse: 88 (!) 123 84 62  Resp: 15 19 10    Temp:  98.8 F (37.1 C) 98.4 F (36.9 C) 97.8 F (36.6 C)  TempSrc:  Oral Axillary Axillary  SpO2: 99% 97% 98% 98%  Weight:  69.8 kg    Height:  5\' 5"  (1.651 m)      Examination:  General exam: Elderly male, moderately built, frail and chronically ill looking sitting up in bed eating breakfast, mild intermittent tachypnea. Respiratory system: Slightly diminished breath sounds bilaterally with scattered few expiratory wheezes, no obvious crackles.  Mild intermittent tachypnea. Cardiovascular system: S1 & S2 heard, irregularly irregular and tachycardic. No JVD, murmurs, rubs, gallops or clicks.  Trace bilateral ankle edema Gastrointestinal system: Abdomen is nondistended, soft and nontender. No organomegaly or masses felt. Normal bowel sounds heard. Central nervous system: Alert and oriented x2. No focal neurological deficits. Extremities: Symmetric 5 x 5 power. Skin: No rashes, lesions or ulcers Psychiatry: Judgement and insight appear impaired. Mood & affect appropriate.     Data Reviewed: I have personally reviewed following labs and imaging studies  CBC: Recent Labs  Lab 10/21/18 1700 10/22/18 0753  WBC 5.3 7.1  NEUTROABS 4.2  --   HGB 12.8* 14.0  HCT 39.1 41.8  MCV 97.3 95.0  PLT 95* 94*   Basic Metabolic Panel: Recent Labs  Lab 10/21/18 1700 10/22/18 0753  NA 139 141  K 4.1 3.6  CL 105 103  CO2 25 24  GLUCOSE 107* 95  BUN 24* 21  CREATININE 1.33* 1.36*  CALCIUM 9.1 9.3  MG  --  1.8   Liver Function Tests: Recent Labs  Lab 10/21/18 1700 10/22/18 0753  AST 36 34  ALT 5 8  ALKPHOS 56 62  BILITOT 1.4* 1.8*    PROT 6.3* 6.6  ALBUMIN 3.5 3.8    Cardiac Enzymes: No results for input(s): CKTOTAL, CKMB, CKMBINDEX, TROPONINI in the last 168 hours.  CBG: No results for input(s): GLUCAP in the last 168 hours.  Recent Results (from the past 240 hour(s))  SARS CORONAVIRUS 2 (TAT 6-24 HRS) Nasopharyngeal Nasopharyngeal Swab     Status: None   Collection Time: 10/21/18  8:33 PM   Specimen: Nasopharyngeal Swab  Result Value Ref Range Status   SARS Coronavirus 2 NEGATIVE NEGATIVE Final    Comment: (NOTE) SARS-CoV-2 target nucleic acids are NOT DETECTED. The SARS-CoV-2 RNA is generally detectable in upper and lower respiratory specimens during the acute phase of infection. Negative results do not preclude SARS-CoV-2 infection, do not rule out co-infections with other pathogens, and should not be used as the sole basis for treatment or other patient management decisions. Negative results must be combined with clinical observations, patient history, and epidemiological information. The expected result is Negative. Fact Sheet for Patients: SugarRoll.be Fact Sheet for Healthcare Providers: https://www.woods-mathews.com/ This test is not yet approved or cleared by the [place] FDA and  has been authorized for detection and/or diagnosis of SARS-CoV-2 by FDA under an Emergency Use Authorization (EUA). This EUA will remain  in effect (meaning this test can be used) for the duration of the COVID-19 declaration under Section 56 4(b)(1) of the Act, 21 U.S.C. section 360bbb-3(b)(1), unless the authorization is terminated or revoked sooner. Performed at [facility], [place] [address] [place], [place] [address]   MRSA PCR Screening     Status: None   Collection Time: 10/22/18  5:39 AM   Specimen: Nasal Mucosa; Nasopharyngeal  Result Value Ref Range Status   MRSA by PCR NEGATIVE NEGATIVE Final    Comment:        The GeneXpert MRSA Assay (FDA approved  for NASAL specimens only), is one component of a comprehensive MRSA colonization surveillance program. It is not intended to diagnose MRSA infection nor to guide or monitor treatment for MRSA infections. Performed at [facility], [place] [address] [place],  [address]          Radiology Studies: Ct Head Wo Contrast  Result Date: 10/21/2018 CLINICAL DATA:  Altered LOC EXAM: CT HEAD WITHOUT CONTRAST TECHNIQUE: Contiguous axial images were obtained from the base of the skull through the vertex without intravenous contrast. COMPARISON:  CT brain 03/26/2015 FINDINGS: Brain: Motion degradation over the posterior fossa and inferior brain. No acute consolidation or pleural effusion. Prominent atrophy. Moderate hypodensity in the white matter consistent with small vessel ischemic change. Stable ventricle size. Vascular: No hyperdense vessels.  Carotid vascular calcification Skull: No fracture Sinuses/Orbits: Mucosal thickening in the maxillary and ethmoid sinuses Other: None IMPRESSION: 1. Mild motion degraded study 2. No definite CT evidence for acute intracranial abnormality  3. Atrophy and small vessel ischemic change of white matter Electronically Signed   By: Donavan Foil M.D.   On: 10/21/2018 18:28   Dg Chest Port 1 View  Result Date: 10/21/2018 CLINICAL DATA:  Per pt's son: they were placing pt in passenger seat of car Today and while driving pt begun to look blank in the face and drooling and unable to response when spoken to. Son called EMS because he noted that he was having increased tremors which concerned the son as possible seizure activity. Pt w/ hx of parkinsonian tremors, son reports today's episode was just more severe. EMS reports pt is in afib during transport, which he has a remote hx of, been off meds for it for over 10 years and has not had recurrent episode. Per pt no chest pain but SOB during exam. Hx of A-fib, HTN. EXAM: PORTABLE CHEST 1 VIEW COMPARISON:  06/21/2018  FINDINGS: Cardiac silhouette is mildly enlarged. No mediastinal or hilar masses. Lungs demonstrate stable prominent bronchovascular markings. No evidence of pneumonia or pulmonary edema. No pleural effusion or pneumothorax. Skeletal structures are demineralized. Prior vertebroplasty has been performed in the lower thoracic and lumbar spine. IMPRESSION: No acute cardiopulmonary disease. Electronically Signed   By: Lajean Manes M.D.   On: 10/21/2018 17:05        Scheduled Meds:  apixaban  2.5 mg Oral BID   carbidopa-levodopa  1 tablet Oral QHS   carbidopa-levodopa  2 tablet Oral QID   donepezil  10 mg Oral QHS   iron polysaccharides  150 mg Oral Daily   latanoprost  1 drop Both Eyes QHS   mouth rinse  15 mL Mouth Rinse BID   Melatonin  3 mg Oral QHS   multivitamin with minerals  1 tablet Oral Daily   PARoxetine  10 mg Oral Daily   polyethylene glycol  17 g Oral Daily   tamsulosin  0.4 mg Oral Daily   Continuous Infusions:   LOS: 0 days     Vernell Leep, MD, FACP, [facility]. [facility]  To contact the attending provider between 7A-7P or the covering provider during after hours 7P-7A, please log into the web site [email or web address] and access using universal [place] password for that web site. If you do not have the password, please call the hospital operator.  10/22/2018, 11:06 AM

## 2018-10-22 NOTE — Progress Notes (Signed)
PT Cancellation Note  Patient Details Name: KRYSTAL MURATALLA MRN: [id number] DOB: 1924-03-11   Cancelled Treatment:    Reason Eval/Treat Not Completed: Patient at procedure or test/unavailable Will follow up as time allows.   Marguarite Arbour A Khloie Hamada 10/22/2018, 12:29 PM Wray Kearns, PT, DPT Acute Rehabilitation Services Pager [phone] Office [phone]

## 2018-10-22 NOTE — Progress Notes (Addendum)
Addendum   VQ scan: Normal perfusion lung scan with no specific defects to suggest pulmonary embolus.  Ventilation images were not performed.  Chest x-ray: Unable to personally review images.  Per report, stable cardiomegaly with normal pulmonary vascularity.  Chronic bilateral interstitial lung disease.  No acute focal infiltrate.  His chronic dyspnea and some hypoxia may be related to ILD noted on chest x-ray.  Cardiology input appreciated.  They feel that he is clinically euvolemic after diuresis now.  Requesting repeat TTE to assess LVEF.  They recommend event monitor as outpatient to assess for tachybradycardia episodes and compression hose.  Rates reported to be stable during their review.  PT and ST have yet to evaluate.  I was able to discuss with patient's son in detail this afternoon by phone.  Patient reportedly passed out for a couple of minutes as he just got into his car and was heading for a dermatologist appointment.  He was just staring blankly, not responding but unable to say if he had any incontinence.  EMS was activated.  Son did indicate to me that patient has been gradually declining over several weeks to months.  He also indicated that patient looks worse than he did yesterday in terms of more confusion.  I discussed regarding PMT consult and he seemed agreeable.  Vernell Leep, MD, FACP, [facility]. [facility]  To contact the attending provider between 7A-7P or the covering provider during after hours 7P-7A, please log into the web site [email or web address] and access using universal [place] password for that web site. If you do not have the password, please call the hospital operator.

## 2018-10-22 NOTE — Progress Notes (Signed)
  Echocardiogram 2D Echocardiogram has been performed.  Henry Alvarez G Maruice Pieroni 10/22/2018, 3:50 PM

## 2018-10-22 NOTE — Progress Notes (Signed)
SLP Cancellation Note  Patient Details Name: ZAKEE BRUNER MRN: [id number] DOB: Jan 08, 1925   Cancelled treatment:       Reason Eval/Treat Not Completed: Patient at procedure or test/unavailable; will continue efforts this pm if schedule permits.  Amedee Cerrone L. [name], [place] Office number [phone] Pager [phone]    [name] 10/22/2018, 11:58 AM

## 2018-10-22 NOTE — Progress Notes (Signed)
EEG complete - results pending 

## 2018-10-22 NOTE — Progress Notes (Signed)
SLP Cancellation Note  Patient Details Name: Henry Alvarez MRN: [id number] DOB: [birth year]   Cancelled treatment:       Reason Eval/Treat Not Completed: Patient at procedure or test/unavailable. Returned to attempt swallow eval; pt with echo.  Will continue efforts  [name] L. [name], [place] CCC/SLP Acute Rehabilitation Services Office number [phone] Pager [phone]    [name] 10/22/2018, 2:53 PM

## 2018-10-22 NOTE — Discharge Instructions (Signed)

## 2018-10-22 NOTE — Progress Notes (Addendum)
Addendum  TTE results reviewed as below.  Abnormal suggesting acute systolic CHF/cardiomyopathy.  Cardiology to follow-up.  IMPRESSIONS    1. The left ventricle has severely reduced systolic function, with an ejection fraction of 20-25%. The cavity size was normal. There is moderately increased left ventricular wall thickness. Left ventricular diastolic function could not be evaluated  secondary to atrial fibrillation. Left ventricular diffuse hypokinesis.  2. Left atrial size was severely dilated.  3. The mitral valve is abnormal. Mild thickening of the mitral valve leaflet. Mitral valve regurgitation is mild to moderate by color flow Doppler.  4. The tricuspid valve is grossly normal. Tricuspid valve regurgitation is mild-moderate.  5. The aortic valve is tricuspid. Moderate calcification of the aortic valve. Aortic valve regurgitation is mild by color flow Doppler. Moderate stenosis of the aortic valve.  6. The aorta is normal unless otherwise noted.  7. The inferior vena cava was dilated in size with >50% respiratory variability.  Vernell Leep, MD, FACP, [facility]. [facility]  To contact the attending provider between 7A-7P or the covering provider during after hours 7P-7A, please log into the web site [email or web address] and access using universal [place] password for that web site. If you do not have the password, please call the hospital operator.

## 2018-10-22 NOTE — ED Notes (Signed)
Patient currently sleeping. Noted to have a lot of artifact on cardiac monitor - when RN went into room, patient appears to be having bad dream in which he is shaking his arms, groaning, and breathing fast. This lasts for about a minute, and then appears more comfortable, though he begins doing it again a few minutes later.

## 2018-10-23 DIAGNOSIS — I480 Paroxysmal atrial fibrillation: Secondary | ICD-10-CM

## 2018-10-23 DIAGNOSIS — I2510 Atherosclerotic heart disease of native coronary artery without angina pectoris: Secondary | ICD-10-CM

## 2018-10-23 DIAGNOSIS — Z515 Encounter for palliative care: Secondary | ICD-10-CM

## 2018-10-23 DIAGNOSIS — R55 Syncope and collapse: Secondary | ICD-10-CM

## 2018-10-23 DIAGNOSIS — I5043 Acute on chronic combined systolic (congestive) and diastolic (congestive) heart failure: Secondary | ICD-10-CM

## 2018-10-23 DIAGNOSIS — I5021 Acute systolic (congestive) heart failure: Secondary | ICD-10-CM

## 2018-10-23 LAB — BASIC METABOLIC PANEL
Anion gap: 9 (ref 5–15)
BUN: 29 mg/dL — ABNORMAL HIGH (ref 8–23)
CO2: 29 mmol/L (ref 22–32)
Calcium: 9 mg/dL (ref 8.9–10.3)
Chloride: 102 mmol/L (ref 98–111)
Creatinine, Ser: 1.53 mg/dL — ABNORMAL HIGH (ref 0.61–1.24)
GFR calc Af Amer: 45 mL/min — ABNORMAL LOW (ref >60)
GFR calc non Af Amer: 39 mL/min — ABNORMAL LOW (ref >60)
Glucose, Bld: 105 mg/dL — ABNORMAL HIGH (ref 70–99)
Potassium: 3.6 mmol/L (ref 3.5–5.1)
Sodium: 140 mmol/L (ref 135–145)

## 2018-10-23 LAB — CBC
HCT: 40.3 % (ref 39.0–52.0)
Hemoglobin: 12.8 g/dL — ABNORMAL LOW (ref 13.0–17.0)
MCH: 30.5 pg (ref 26.0–34.0)
MCHC: 31.8 g/dL (ref 30.0–36.0)
MCV: 96 fL (ref 80.0–100.0)
Platelets: 106 10*3/uL — ABNORMAL LOW (ref 150–400)
RBC: 4.2 MIL/uL — ABNORMAL LOW (ref 4.22–5.81)
RDW: 15.7 % — ABNORMAL HIGH (ref 11.5–15.5)
WBC: 6.5 10*3/uL (ref 4.0–10.5)
nRBC: 0 % (ref 0.0–0.2)

## 2018-10-23 MED ORDER — RESOURCE THICKENUP CLEAR PO POWD
ORAL | Status: DC | PRN
  Filled 2018-10-23: qty 125

## 2018-10-23 MED ORDER — LOSARTAN POTASSIUM 25 MG PO TABS
12.5000 mg | ORAL_TABLET | Freq: Every day | ORAL | Status: DC
  Administered 2018-10-24 – 2018-10-26 (×3): 12.5 mg via ORAL
  Filled 2018-10-23 (×3): qty 1

## 2018-10-23 NOTE — Evaluation (Signed)
Clinical/Bedside Swallow Evaluation Patient Details  Name: Henry Alvarez MRN: [id number] Date of Birth: 1925/01/24  Today's Date: 10/23/2018 Time: SLP Start Time (ACUTE ONLY): 1140 SLP Stop Time (ACUTE ONLY): 1203 SLP Time Calculation (min) (ACUTE ONLY): 23 min  Past Medical History:  Past Medical History:  Diagnosis Date  . Anemia   . Arthritis   . Atrial fibrillation ([place])   . BPH (benign prostatic hyperplasia)   . C. difficile colitis    hx of cdiff last fall 2016   . Dementia ([place])   . Depression   . Hypertension   . Iron deficiency anemia   . Parkinson disease ([place])   . Thrombocytopenia ([place])    Past Surgical History:  Past Surgical History:  Procedure Laterality Date  . CARDIAC CATHETERIZATION  10/30/2005   Est. EF 65% -- Single-vessel obstructive atherosclerotic coronary artery disease -- Normal left ventricular function -- Peter M. Martinique, M.D.  . CARPAL TUNNEL RELEASE Left   . CORONARY ANGIOPLASTY WITH STENT PLACEMENT  11/02/2005   intracoronary stenting of the proximal left anterior descending artery --  Peter M. Martinique, M.D.  . CYSTOSCOPY  12/21/2010   Procedure: CYSTOSCOPY;  Surgeon: Molli Hazard, MD;  Location: [facility];  Service: Urology;  Laterality: N/A;  . JOINT REPLACEMENT     right  . KYPHOSIS SURGERY  09/2010  . LARYNGOSCOPY N/A 08/26/2012   Procedure: DIRECT LARYNGOSCOPY WITH RADIESSE INJECTIONS ;  Surgeon: Melida Quitter, MD;  Location: [place] [facility];  Service: ENT;  Laterality: N/A;  direct laryngoscopy with radiesse injections  . TOTAL KNEE ARTHROPLASTY     right  . TOTAL KNEE ARTHROPLASTY  08/07/2011   Procedure: TOTAL KNEE ARTHROPLASTY;  Surgeon: Gearlean Alf, MD;  Location: [facility];  Service: Orthopedics;  Laterality: Left;  . TRANSURETHRAL RESECTION OF PROSTATE  12/2010   HPI:  85 y.o. male with history of Parkinson's disease, atrial fibrillation, CAD, iron deficiency anemia, dementia, chronic thrombocytopenia was brought to the ER after patient  had a brief syncopal episode during which patient also had some tremors and was experiencing shortness of breath.  Hx MBS May 2016 which revealed aspiration of thin /nectar-thick liquids.  Regular diet with nectar thick liquids and chin tuck was recommended at the time.   Assessment / Plan / Recommendation Clinical Impression  Pt has consistent, immediate coughing with thin liquids that is eliminated with consumption of other consistencies. His aspiration on prior MBS, several years ago, was silent, but his airway protection was improved with use of a chin tuck. Pt naturally lowers his chin as he is self-feeding nectar thick liquids from a cup. He has moderate oral residue with meats that he clears with Mod cues. CXR was clear upon admission, suggestive of adequate tolerance of baseline diet, although unsure if he was still thickening his liquids PTA. Recommend adjusting diet to Dys 2 solids and nectar thick liquids. SLP will f/u for tolerance versus potential need for repeat swallow study. SLP Visit Diagnosis: Dysphagia, unspecified (R13.10)    Aspiration Risk  Moderate aspiration risk    Diet Recommendation Dysphagia 2 (Fine chop);Nectar-thick liquid   Liquid Administration via: Cup Medication Administration: Crushed with puree Supervision: Staff to assist with self feeding;Full supervision/cueing for compensatory strategies Compensations: Slow rate;Small sips/bites;Chin tuck Postural Changes: Seated upright at 90 degrees    Other  Recommendations Oral Care Recommendations: Oral care BID Other Recommendations: Order thickener from pharmacy;Prohibited food (jello, ice cream, thin soups);Remove water pitcher   Follow up Recommendations (tba)  Frequency and Duration min 2x/week  2 weeks       Prognosis Prognosis for Safe Diet Advancement: Fair Barriers to Reach Goals: Cognitive deficits;Time post onset      Swallow Study   General HPI: 85 y.o. male with history of Parkinson's  disease, atrial fibrillation, CAD, iron deficiency anemia, dementia, chronic thrombocytopenia was brought to the ER after patient had a brief syncopal episode during which patient also had some tremors and was experiencing shortness of breath.  Hx MBS May 2016 which revealed aspiration of thin /nectar-thick liquids.  Regular diet with nectar thick liquids and chin tuck was recommended at the time. Type of Study: Bedside Swallow Evaluation Previous Swallow Assessment: see HPI Diet Prior to this Study: Dysphagia 3 (soft);Thin liquids Temperature Spikes Noted: No Respiratory Status: Nasal cannula History of Recent Intubation: No Behavior/Cognition: Alert;Cooperative;Confused;Requires cueing Oral Cavity Assessment: (limited assessment) Oral Care Completed by SLP: No Oral Cavity - Dentition: Adequate natural dentition Vision: Functional for self-feeding Self-Feeding Abilities: Needs assist Patient Positioning: Upright in bed Baseline Vocal Quality: Normal    Oral/Motor/Sensory Function Overall Oral Motor/Sensory Function: (appears functional)   Ice Chips Ice chips: Not tested   Thin Liquid Thin Liquid: Impaired Presentation: Cup;Self Fed Pharyngeal  Phase Impairments: Cough - Immediate    Nectar Thick Nectar Thick Liquid: Within functional limits Presentation: Cup;Self Fed   Honey Thick Honey Thick Liquid: Not tested   Puree Puree: Not tested   Solid     Solid: Impaired Oral Phase Impairments: Impaired mastication Oral Phase Functional Implications: Oral residue      Venita Sheffield Nickisha Hum 10/23/2018,1:18 PM  Pollyann Glen, M.A. [place] Acute Environmental education officer [phone] Office [phone]

## 2018-10-23 NOTE — Procedures (Signed)
Patient Name: Henry Alvarez  MRN: [id number]  Epilepsy Attending: Lora Havens  Referring Physician/Provider: Dr Laurey Morale Date: 10/22/2018  Duration: 23.09 mins  Patient history: 85 year old male with dizziness and tremors.  EEG to evaluate for seizures.  Level of alertness: Awake/lethargic  AEDs during EEG study: None  Technical aspects: This EEG study was done with scalp electrodes positioned according to the 10-20 International system of electrode placement. Electrical activity was acquired at a sampling rate of 500Hz  and reviewed with a high frequency filter of 70Hz  and a low frequency filter of 1Hz . EEG data were recorded continuously and digitally stored.   DESCRIPTION:   The posterior dominant rhythm consists of 8 Hz activity of moderate voltage (25-35 uV) seen predominantly in posterior head regions, symmetric and reactive to eye opening and eye closing. Drowsiness was characterized by attenuation of the posterior background rhythm. There is intermittent left temporal slowing. Hyperventilation and photic stimulation were not performed.  IMPRESSION: This study is suggestive of cortical dysfunction in left temporal region, non specific to etiology.  No seizures or epileptiform discharges were seen throughout the recording.  Coila Wardell Barbra Sarks

## 2018-10-23 NOTE — Progress Notes (Signed)
PROGRESS NOTE   Henry Alvarez  [id number]    DOB: 1924/09/04    DOA: 10/21/2018  PCP: Reynold Bowen, MD   I have briefly reviewed patients previous medical records in [facility].  Chief Complaint  Patient presents with   Tremors   Pre Syncope    Brief Narrative:  85 year old male, reportedly lives with son and family, independent, PMH of PAF on Eliquis, tachybradycardia syndrome, CAD status post PCI, TIA, HTN, HLD, idiopathic Parkinson's disease, insomnia for which she was newly started on cyclobenzaprine during OP neurology follow-up 9/4, iron deficiency anemia, dementia, chronic thrombocytopenia, presented to ED on 9/14 due to syncope or near syncope, tremors and dyspnea.  In ED, mildly hypoxic, EKG with A. fib in the 90s, BNP 1289, CT head negative, mildly elevated d-dimer, VQ scan ordered and pending, treated with a dose of IV Lasix with good diuresis.  Noted some swallowing difficulty, wheezing on exam, ST consulted.  Cardiology consulted to evaluate possible cardiac causes for syncope, A. fib with RVR this morning and suspect acute diastolic CHF.  Assessment & Plan:   Principal Problem:   Acute CHF (congestive heart failure) (HCC) Active Problems:   Paroxysmal atrial fibrillation (HCC)   Long term current use of anticoagulant therapy   Parkinson's disease (HCC)   BPH (benign prostatic hyperplasia)   Syncope   Acute diastolic CHF (congestive heart failure) (HCC)   Acute respiratory failure with hypoxia in the setting of questionable heart failure exacerbation/A. fib with RVR, POA  Patient evaluated this morning on 2 L nasal cannula, baseline oxygen at home and with exertion is room air  -patient has no history of chronic hypoxia or home oxygen   aspiration less likely given unremarkable physical exam and chest x-ray for infiltrate  VQ scan negative  Xopenex nebulizations.  Not sure if he will cooperate with incentive spirometry and flutter  valve.  Clinically improving with resolution of tachyarrhythmia  Wean oxygen as tolerated, ambulatory O2 screen once more ambulatory with PT  Acute diastolic CHF exacerbation, POA  May have been precipitated by A. fib with RVR.  TTE October 2019: LVEF 50-55% and unable to evaluate diastolic function due to A. Fib.  Repeat echo shows markedly reduced EF at 20 to 25% when compared to October of last year  Weaning off oxygen as above, clinically appears improved  Appears euvolemic on exam  PAF with RVR/history of tachybradycardia syndrome -   Noted A. fib with RVR up to 140s-150s previously, heart rate more adequately controlled  Per cardiology office note from 2016, history of tachybradycardia syndrome and avoiding AVN blocking agents -previously evaluated for pacemaker placement however given his improvement at that time no indication to move forward  Currently asymptomatic.  Cardiology following, appreciate insight and recommendations  Continue apixaban.  Syncope/near syncopal event Unlikely seizure  Likely multifactorial in the setting of A. fib RVR, orthostatic hypotension.  Follow EEG.  CT head negative for acute findings.  Patient states that he does not drive.  PT evaluation ongoing  AKI on Stage III chronic kidney disease  Cr 1.5 this am - base appears to be around 1.3  Likely in the setting of diuresis as above  Suspected dysphagia   Patient reports that he chokes on food sometimes.  Noted difficulty with breakfast this morning.  Speech evaluation pending  Parkinson's disease  Follows with Dr. Metta Clines, [facility], last seen on 9/4.  Continue Sinemet at home dose.  Due to concern for worsening  tremor/mental status changes, holding Flexeril and Ultracet.  BPH  Continue tamsulosin.  Chronic thrombocytopenia  Stable.  CAD status post PCI  No anginal symptoms.  Minimally elevated troponin, flat trend likely due to CKD and  demand ischemia.  Not on aspirin due to being on Eliquis.  No beta-blockers due to history of tachybradycardia syndrome.  Not on statins likely due to advanced age.  Insomnia  Will be difficult to treat.  Continue melatonin.  Discontinued recently started Flexeril.  Dementia/depression  Has moderate cognitive impairment -pleasant but somewhat poor historian.  No agitation noted.  Continue Aricept and Paxil.  Essential hypertension  Mildly uncontrolled at times.  Not on antihypertensives PTA.  May also be difficult to treat given periodic orthostatic symptoms and blood pressures.  PRN IV hydralazine for now.  Adult failure to thrive  Due to very advanced age, overall physical frailty and deconditioning, multiple severe significant comorbidities.    PMT consulted for goals of care.   DVT prophylaxis: Apixaban Code Status: Full Disposition: Pending clinical improvement, A. fib with RVR somewhat improving, now weaning oxygen down to room air, will need further PT OT evaluation and pulse oxygen walk screening to ensure no ongoing hypoxia with exertion.  Speech therapy to evaluate for reported dysphagia and concern for possible aspiration as trigger for A. fib with RVR but less likely given asymptomatic, clinical exam and unremarkable chest x-ray.  Ending further resolution of hypoxia, A. fib with RVR and dysphasia likely be reasonable for discharge home in the next 24 to 48 hours unless PT recommends placement.  Consultants:  Cardiology  Procedures:  None  Antimicrobials:  None   Subjective: No acute issues or events overnight, currently declines chest pain, shortness of breath, nausea, vomiting, diarrhea, constipation, headache, fevers, chills.  Objective:  Vitals:   10/22/18 2343 10/23/18 0300 10/23/18 0354 10/23/18 0500  BP: 108/76  (!) 142/100   Pulse: 86 88 93   Resp: (!) 22 (!) 28 14   Temp: 97.9 F (36.6 C)  98 F (36.7 C)   TempSrc: Oral  Oral   SpO2: 99% 95%  97%   Weight:    69.4 kg  Height:        Examination:  General exam: Elderly male, resting comfortably in bed, watching television, no acute distress Respiratory system: Diminished breath sounds bilaterally without overt wheezes rales or rhonchi Cardiovascular system: Irregularly irregular rhythm, rate in the 90s. No JVD, murmurs, rubs, gallops or clicks.  Gastrointestinal system: Abdomen is nondistended, soft and nontender. No organomegaly or masses felt. Normal bowel sounds heard. Central nervous system: Alert and oriented to person and general situation. No focal neurological deficits. Skin: No rashes, lesions or ulcers Psychiatry: Judgement and insight appear impaired. Mood & affect appropriate.   Data Reviewed: I have personally reviewed following labs and imaging studies  CBC: Recent Labs  Lab 10/21/18 1700 10/22/18 0753 10/23/18 0232  WBC 5.3 7.1 6.5  NEUTROABS 4.2  --   --   HGB 12.8* 14.0 12.8*  HCT 39.1 41.8 40.3  MCV 97.3 95.0 96.0  PLT 95* 94* [id number]*   Basic Metabolic Panel: Recent Labs  Lab 10/21/18 1700 10/22/18 0753 10/23/18 0232  NA 139 141 140  K 4.1 3.6 3.6  CL 105 103 102  CO2 25 24 29   GLUCOSE 107* 95 105*  BUN 24* 21 29*  CREATININE 1.33* 1.36* 1.53*  CALCIUM 9.1 9.3 9.0  MG  --  1.8  --    Liver Function Tests:  Recent Labs  Lab 10/21/18 1700 10/22/18 0753  AST 36 34  ALT 5 8  ALKPHOS 56 62  BILITOT 1.4* 1.8*  PROT 6.3* 6.6  ALBUMIN 3.5 3.8    Cardiac Enzymes: No results for input(s): CKTOTAL, CKMB, CKMBINDEX, TROPONINI in the last 168 hours.  CBG: No results for input(s): GLUCAP in the last 168 hours.  Recent Results (from the past 240 hour(s))  SARS CORONAVIRUS 2 (TAT 6-24 HRS) Nasopharyngeal Nasopharyngeal Swab     Status: None   Collection Time: 10/21/18  8:33 PM   Specimen: Nasopharyngeal Swab  Result Value Ref Range Status   SARS Coronavirus 2 NEGATIVE NEGATIVE Final    Comment: (NOTE) SARS-CoV-2 target nucleic acids are  NOT DETECTED. The SARS-CoV-2 RNA is generally detectable in upper and lower respiratory specimens during the acute phase of infection. Negative results do not preclude SARS-CoV-2 infection, do not rule out co-infections with other pathogens, and should not be used as the sole basis for treatment or other patient management decisions. Negative results must be combined with clinical observations, patient history, and epidemiological information. The expected result is Negative. Fact Sheet for Patients: SugarRoll.be Fact Sheet for Healthcare Providers: https://www.woods-mathews.com/ This test is not yet approved or cleared by the [place] FDA and  has been authorized for detection and/or diagnosis of SARS-CoV-2 by FDA under an Emergency Use Authorization (EUA). This EUA will remain  in effect (meaning this test can be used) for the duration of the COVID-19 declaration under Section 56 4(b)(1) of the Act, 21 U.S.C. section 360bbb-3(b)(1), unless the authorization is terminated or revoked sooner. Performed at [facility], [place] [address] [place], [place] [address]   MRSA PCR Screening     Status: None   Collection Time: 10/22/18  5:39 AM   Specimen: Nasal Mucosa; Nasopharyngeal  Result Value Ref Range Status   MRSA by PCR NEGATIVE NEGATIVE Final    Comment:        The GeneXpert MRSA Assay (FDA approved for NASAL specimens only), is one component of a comprehensive MRSA colonization surveillance program. It is not intended to diagnose MRSA infection nor to guide or monitor treatment for MRSA infections. Performed at [facility], [place] [address], [place], [place] [address]          Radiology Studies: Ct Head Wo Contrast  Result Date: 10/21/2018 CLINICAL DATA:  Altered LOC EXAM: CT HEAD WITHOUT CONTRAST TECHNIQUE: Contiguous axial images were obtained from the base of the skull through the vertex without intravenous  contrast. COMPARISON:  CT brain 03/26/2015 FINDINGS: Brain: Motion degradation over the posterior fossa and inferior brain. No acute consolidation or pleural effusion. Prominent atrophy. Moderate hypodensity in the white matter consistent with small vessel ischemic change. Stable ventricle size. Vascular: No hyperdense vessels.  Carotid vascular calcification Skull: No fracture Sinuses/Orbits: Mucosal thickening in the maxillary and ethmoid sinuses Other: None IMPRESSION: 1. Mild motion degraded study 2. No definite CT evidence for acute intracranial abnormality 3. Atrophy and small vessel ischemic change of white matter Electronically Signed   By: Donavan Foil M.D.   On: 10/21/2018 18:28   Nm Pulmonary Perf And Vent  Result Date: 10/22/2018 CLINICAL DATA:  Syncope, shortness of breath EXAM: NUCLEAR MEDICINE PERFUSION LUNG SCAN TECHNIQUE: Perfusion images were obtained in multiple projections after intravenous injection of Tc-6m MAA. RADIOPHARMACEUTICALS:  1.5 mCi Tc51m MAA IV COMPARISON:  Chest radiograph 10/21/2018 FINDINGS: Perfusion: No wedge shaped peripheral perfusion defects to suggest acute pulmonary embolism. Expected  photopenia along posterior portions of the chest related to the patient's lower arm position. Expected photopenia related to large cardiac contour. Ventilation images were not performed. IMPRESSION: 1. Normal perfusion lung scan with no specific defects to suggest pulmonary embolus. Ventilation images were not performed. Electronically Signed   By: Van Clines M.D.   On: 10/22/2018 12:09   Dg Chest Port 1 View  Result Date: 10/22/2018 CLINICAL DATA:  Shortness of breath. EXAM: PORTABLE CHEST 1 VIEW COMPARISON:  Lung scan 10/22/2018. Chest x-ray 10/21/2018, 01/18/2017. FINDINGS: Cardiomegaly with normal pulmonary vascularity. No focal infiltrate. Chronic bilateral interstitial prominence noted. No pleural effusion or pneumothorax. IMPRESSION: 1.  Stable cardiomegaly with normal  pulmonary vascularity. 2. Chronic bilateral interstitial lung disease. No acute focal infiltrate. Electronically Signed   By: Marcello Moores  Register   On: 10/22/2018 12:27   Dg Chest Port 1 View  Result Date: 10/21/2018 CLINICAL DATA:  Per pt's son: they were placing pt in passenger seat of car Today and while driving pt begun to look blank in the face and drooling and unable to response when spoken to. Son called EMS because he noted that he was having increased tremors which concerned the son as possible seizure activity. Pt w/ hx of parkinsonian tremors, son reports today's episode was just more severe. EMS reports pt is in afib during transport, which he has a remote hx of, been off meds for it for over 10 years and has not had recurrent episode. Per pt no chest pain but SOB during exam. Hx of A-fib, HTN. EXAM: PORTABLE CHEST 1 VIEW COMPARISON:  06/21/2018 FINDINGS: Cardiac silhouette is mildly enlarged. No mediastinal or hilar masses. Lungs demonstrate stable prominent bronchovascular markings. No evidence of pneumonia or pulmonary edema. No pleural effusion or pneumothorax. Skeletal structures are demineralized. Prior vertebroplasty has been performed in the lower thoracic and lumbar spine. IMPRESSION: No acute cardiopulmonary disease. Electronically Signed   By: Lajean Manes M.D.   On: 10/21/2018 17:05        Scheduled Meds:  apixaban  2.5 mg Oral BID   carbidopa-levodopa  1 tablet Oral QHS   carbidopa-levodopa  2 tablet Oral QID   donepezil  10 mg Oral QHS   iron polysaccharides  150 mg Oral Daily   latanoprost  1 drop Both Eyes QHS   mouth rinse  15 mL Mouth Rinse BID   Melatonin  3 mg Oral QHS   multivitamin with minerals  1 tablet Oral Daily   PARoxetine  5 mg Oral QHS   polyethylene glycol  17 g Oral Daily   saccharomyces boulardii  250 mg Oral Q1400   tamsulosin  0.4 mg Oral Daily    LOS: 1 day   Little Ishikawa, DO. [facility]  10/23/2018, 7:14 AM

## 2018-10-23 NOTE — Evaluation (Signed)
Occupational Therapy Evaluation Patient Details Name: Henry Alvarez MRN: [id number] DOB: November 13, 1924 Today's Date: 10/23/2018    History of Present Illness Patient is a 85 y/o male who was admitted due to syncopal episode. BNP elevated at 1289 consistent with acute diastolic CHF. Workup pending. PMH includes PD, Depression, dementia, CAD, Pafib, HTN, TIA.   Clinical Impression   PTA Pt was mod I in ADL with DME and has a PCA for IADL, lives with son and daughter. Today Pt is min A for UB ADL in seated position, mod A for LB ADL. His cognition has improved since previous PT evaluation, and son who is present throughout session reporting that he typically does have a fun joking personality. Pt DOE 2/4 and HR increased from around 100-148 with activity. (HR resumed around 100 with seated rest breaks and left in chair. At this time recommending [place] and continued skilled OT in the acute setting to maximize safety and independence in ADL and functional transfers. Next session to provide energy conservation education as well as OOB functional ADL.    Follow Up Recommendations  Home health OT;Supervision/Assistance - 24 hour    Equipment Recommendations  3 in 1 bedside commode    Recommendations for Other Services       Precautions / Restrictions Precautions Precautions: Fall Precaution Comments: watch HR and 02 Restrictions Weight Bearing Restrictions: No      Mobility Bed Mobility Overal bed mobility: Needs Assistance Bed Mobility: Supine to Sit     Supine to sit: Min assist;HOB elevated     General bed mobility comments: Good initiation of bringing LEs to EOB, needed assist with getting them off bed and scooting bottom to get feet on floor. Increased time and effort. No dizziness.  Transfers Overall transfer level: Needs assistance Equipment used: Rolling walker (2 wheeled) Transfers: Sit to/from Stand Sit to Stand: Min assist;From elevated surface         General  transfer comment: Assist to power to standing with use of momentum, posterior lean initially but able to self correct with time. Stood from [employer], transferred to chair post ambulation. min guard when standing from chair with arm rests at sink    Balance Overall balance assessment: Needs assistance Sitting-balance support: Feet supported;No upper extremity supported Sitting balance-Leahy Scale: Fair     Standing balance support: During functional activity Standing balance-Leahy Scale: Poor Standing balance comment: dependent on RW, improved from previous session                           ADL either performed or assessed with clinical judgement   ADL Overall ADL's : Needs assistance/impaired Eating/Feeding: Set up;Sitting   Grooming: Wash/dry hands;Wash/dry face;Set up;Sitting Grooming Details (indicate cue type and reason): unable to maintain standing for grooming tasks at sink Upper Body Bathing: Minimal assistance;Sitting   Lower Body Bathing: Moderate assistance;Sitting/lateral leans   Upper Body Dressing : Minimal assistance;Sitting Upper Body Dressing Details (indicate cue type and reason): to don hospital gown like robe Lower Body Dressing: Moderate assistance;Sit to/from stand   Toilet Transfer: Minimal assistance;Ambulation;RW;Cueing for safety   Toileting- Clothing Manipulation and Hygiene: Moderate assistance Toileting - Clothing Manipulation Details (indicate cue type and reason): currently with condom catheter     Functional mobility during ADLs: Minimal assistance;Cueing for safety;Rolling walker General ADL Comments: decreased activity tolerance, balance     Vision Baseline Vision/History: Wears glasses Wears Glasses: At all times Patient Visual Report: No change  from baseline       Perception     Praxis      Pertinent Vitals/Pain Pain Assessment: No/denies pain Faces Pain Scale: No hurt     Hand Dominance Right   Extremity/Trunk  Assessment Upper Extremity Assessment Upper Extremity Assessment: Generalized weakness   Lower Extremity Assessment Lower Extremity Assessment: Defer to PT evaluation   Cervical / Trunk Assessment Cervical / Trunk Assessment: Kyphotic   Communication Communication Communication: HOH   Cognition Arousal/Alertness: Awake/alert Behavior During Therapy: WFL for tasks assessed/performed Overall Cognitive Status: History of cognitive impairments - at baseline                                 General Comments: Pt oriented this session, telling jokes, son present and saying this is typical behavior - Pt very pleasant and enjoyable   General Comments  HR elevated to 148 with activity, RN aware. Son "[name]" present throughout session    Exercises     Shoulder Instructions      [place] expects to be discharged to:: Private residence Living Arrangements: Children(son and daughter) Available Help at Discharge: Personal care attendant(3-4 hrs per day 3-4 days/week) Type of Home: House Home Access: Stairs to enter [employer] of Steps: 2 Entrance Stairs-Rails: Right Home Layout: Two level;Bed/bath upstairs Alternate Level Stairs-Number of Steps: flight   Bathroom Shower/Tub: Teacher, early years/pre: Standard Bathroom Accessibility: Yes How Accessible: Accessible via walker Home Equipment: [place] - 2 wheels;Shower seat;Cane - single point          Prior Functioning/Environment Level of Independence: Needs assistance  Gait / Transfers Assistance Needed: Uses RW or SPC for ambulation. ADL's / Homemaking Assistance Needed: PCA assists with ADL and IADL.            OT Problem List: Decreased activity tolerance;Impaired balance (sitting and/or standing);Decreased safety awareness;Cardiopulmonary status limiting activity      OT Treatment/Interventions: Self-care/ADL training;Energy conservation;DME and/or AE  instruction;Therapeutic activities;Patient/family education;Balance training    OT Goals(Current goals can be found in the care plan section) Acute Rehab OT Goals Patient Stated Goal: to get out of here OT Goal Formulation: With patient/family Time For Goal Achievement: 11/06/18 Potential to Achieve Goals: Good ADL Goals Pt Will Perform Grooming: with set-up;sitting Pt Will Perform Upper Body Bathing: with set-up;sitting Pt Will Perform Lower Body Bathing: with set-up;sitting/lateral leans;with adaptive equipment Pt Will Transfer to Toilet: with supervision;ambulating Pt Will Perform Toileting - Clothing Manipulation and hygiene: with supervision;sit to/from stand Additional ADL Goal #1: Pt will perform bed mobility at supervision level prior to engaging in ADL Additional ADL Goal #2: Pt will recall 3 ways of conserving energy during ADL with no cues  OT Frequency: Min 2X/week   Barriers to D/C:            Co-evaluation PT/OT/SLP Co-Evaluation/Treatment: Yes Reason for Co-Treatment: For patient/therapist safety;Necessary to address cognition/behavior during functional activity;Other (comment)(activity tolerance) PT goals addressed during session: Mobility/safety with mobility;Balance;Proper use of DME OT goals addressed during session: ADL's and self-care;Proper use of Adaptive equipment and DME      AM-PAC OT "6 Clicks" Daily Activity     Outcome Measure Help from another person eating meals?: None Help from another person taking care of personal grooming?: A Little Help from another person toileting, which includes using toliet, bedpan, or urinal?: A Lot Help from another person bathing (including washing, rinsing, drying)?: A Lot  Help from another person to put on and taking off regular upper body clothing?: A Little Help from another person to put on and taking off regular lower body clothing?: A Lot 6 Click Score: 16   End of Session Equipment Utilized During Treatment:  Gait belt;Rolling walker Nurse Communication: Mobility status  Activity Tolerance: Patient tolerated treatment well Patient left: in chair;with call bell/phone within reach;with family/visitor present  OT Visit Diagnosis: Other abnormalities of gait and mobility (R26.89);Muscle weakness (generalized) (M62.81);History of falling (Z91.81);Other symptoms and signs involving cognitive function                Time: 1531-1602 OT Time Calculation (min): 31 min Charges:  OT General Charges $OT Visit: 1 Visit OT Evaluation $OT Eval Moderate Complexity: Watson OTR/L Acute Rehabilitation Services Pager: [phone] Office: [place] 10/23/2018, 4:44 PM

## 2018-10-23 NOTE — Progress Notes (Addendum)
Progress Note  Patient Name: Henry Alvarez Date of Encounter: 10/23/2018  Primary Cardiologist: No primary care provider on file.   Subjective   Denies any chest pain or SOB  Inpatient Medications    Scheduled Meds:  apixaban  2.5 mg Oral BID   carbidopa-levodopa  1 tablet Oral QHS   carbidopa-levodopa  2 tablet Oral QID   donepezil  10 mg Oral QHS   iron polysaccharides  150 mg Oral Daily   latanoprost  1 drop Both Eyes QHS   mouth rinse  15 mL Mouth Rinse BID   Melatonin  3 mg Oral QHS   multivitamin with minerals  1 tablet Oral Daily   PARoxetine  5 mg Oral QHS   polyethylene glycol  17 g Oral Daily   saccharomyces boulardii  250 mg Oral Q1400   tamsulosin  0.4 mg Oral Daily   Continuous Infusions:  PRN Meds: acetaminophen **OR** acetaminophen, hydrALAZINE, levalbuterol, ondansetron **OR** ondansetron (ZOFRAN) IV   Vital Signs    Vitals:   10/23/18 0300 10/23/18 0354 10/23/18 0500 10/23/18 0744  BP:  (!) 142/100  122/76  Pulse: 88 93  94  Resp: (!) 28 14  14   Temp:  98 F (36.7 C)  97.7 F (36.5 C)  TempSrc:  Oral  Oral  SpO2: 95% 97%  96%  Weight:   69.4 kg   Height:        Intake/Output Summary (Last 24 hours) at 10/23/2018 0812 Last data filed at 10/22/2018 2344 Gross per 24 hour  Intake 240 ml  Output 510 ml  Net -270 ml   Filed Weights   10/21/18 1544 10/22/18 0529 10/23/18 0500  Weight: 73 kg 69.8 kg 69.4 kg    Telemetry    NSR - Personally Reviewed  ECG    No new EKG to review - Personally Reviewed  Physical Exam   GEN: No acute distress.   Neck: No JVD Cardiac: RRR, no murmurs, rubs, or gallops.  Respiratory: Clear to auscultation bilaterally. GI: Soft, nontender, non-distended  MS: No edema; No deformity. Neuro:  Nonfocal  Psych: Normal affect   Labs    Chemistry Recent Labs  Lab 10/21/18 1700 10/22/18 0753 10/23/18 0232  NA 139 141 140  K 4.1 3.6 3.6  CL 105 103 102  CO2 25 24 29   GLUCOSE 107* 95  105*  BUN 24* 21 29*  CREATININE 1.33* 1.36* 1.53*  CALCIUM 9.1 9.3 9.0  PROT 6.3* 6.6  --   ALBUMIN 3.5 3.8  --   AST 36 34  --   ALT 5 8  --   ALKPHOS 56 62  --   BILITOT 1.4* 1.8*  --   GFRNONAA 46* 45* 39*  GFRAA 53* 52* 45*  ANIONGAP 9 14 9      Hematology Recent Labs  Lab 10/21/18 1700 10/22/18 0753 10/23/18 0232  WBC 5.3 7.1 6.5  RBC 4.02* 4.40 4.20*  HGB 12.8* 14.0 12.8*  HCT 39.1 41.8 40.3  MCV 97.3 95.0 96.0  MCH 31.8 31.8 30.5  MCHC 32.7 33.5 31.8  RDW 15.9* 15.9* 15.7*  PLT 95* 94* 106*    Cardiac EnzymesNo results for input(s): TROPONINI in the last 168 hours. No results for input(s): TROPIPOC in the last 168 hours.   BNP Recent Labs  Lab 10/21/18 1700  BNP 1,289.8*     DDimer  Recent Labs  Lab 10/21/18 1700  DDIMER 0.73*     Radiology    Ct  Head Wo Contrast  Result Date: 10/21/2018 CLINICAL DATA:  Altered LOC EXAM: CT HEAD WITHOUT CONTRAST TECHNIQUE: Contiguous axial images were obtained from the base of the skull through the vertex without intravenous contrast. COMPARISON:  CT brain 03/26/2015 FINDINGS: Brain: Motion degradation over the posterior fossa and inferior brain. No acute consolidation or pleural effusion. Prominent atrophy. Moderate hypodensity in the white matter consistent with small vessel ischemic change. Stable ventricle size. Vascular: No hyperdense vessels.  Carotid vascular calcification Skull: No fracture Sinuses/Orbits: Mucosal thickening in the maxillary and ethmoid sinuses Other: None IMPRESSION: 1. Mild motion degraded study 2. No definite CT evidence for acute intracranial abnormality 3. Atrophy and small vessel ischemic change of white matter Electronically Signed   By: Donavan Foil M.D.   On: 10/21/2018 18:28   Nm Pulmonary Perf And Vent  Result Date: 10/22/2018 CLINICAL DATA:  Syncope, shortness of breath EXAM: NUCLEAR MEDICINE PERFUSION LUNG SCAN TECHNIQUE: Perfusion images were obtained in multiple projections after  intravenous injection of Tc-68m MAA. RADIOPHARMACEUTICALS:  1.5 mCi Tc60m MAA IV COMPARISON:  Chest radiograph 10/21/2018 FINDINGS: Perfusion: No wedge shaped peripheral perfusion defects to suggest acute pulmonary embolism. Expected photopenia along posterior portions of the chest related to the patient's lower arm position. Expected photopenia related to large cardiac contour. Ventilation images were not performed. IMPRESSION: 1. Normal perfusion lung scan with no specific defects to suggest pulmonary embolus. Ventilation images were not performed. Electronically Signed   By: Van Clines M.D.   On: 10/22/2018 12:09   Dg Chest Port 1 View  Result Date: 10/22/2018 CLINICAL DATA:  Shortness of breath. EXAM: PORTABLE CHEST 1 VIEW COMPARISON:  Lung scan 10/22/2018. Chest x-ray 10/21/2018, 01/18/2017. FINDINGS: Cardiomegaly with normal pulmonary vascularity. No focal infiltrate. Chronic bilateral interstitial prominence noted. No pleural effusion or pneumothorax. IMPRESSION: 1.  Stable cardiomegaly with normal pulmonary vascularity. 2. Chronic bilateral interstitial lung disease. No acute focal infiltrate. Electronically Signed   By: Marcello Moores  Register   On: 10/22/2018 12:27   Dg Chest Port 1 View  Result Date: 10/21/2018 CLINICAL DATA:  Per pt's son: they were placing pt in passenger seat of car Today and while driving pt begun to look blank in the face and drooling and unable to response when spoken to. Son called EMS because he noted that he was having increased tremors which concerned the son as possible seizure activity. Pt w/ hx of parkinsonian tremors, son reports today's episode was just more severe. EMS reports pt is in afib during transport, which he has a remote hx of, been off meds for it for over 10 years and has not had recurrent episode. Per pt no chest pain but SOB during exam. Hx of A-fib, HTN. EXAM: PORTABLE CHEST 1 VIEW COMPARISON:  06/21/2018 FINDINGS: Cardiac silhouette is mildly  enlarged. No mediastinal or hilar masses. Lungs demonstrate stable prominent bronchovascular markings. No evidence of pneumonia or pulmonary edema. No pleural effusion or pneumothorax. Skeletal structures are demineralized. Prior vertebroplasty has been performed in the lower thoracic and lumbar spine. IMPRESSION: No acute cardiopulmonary disease. Electronically Signed   By: Lajean Manes M.D.   On: 10/21/2018 17:05    Cardiac Studies   2D echo 10/22/2018 IMPRESSIONS   1. The left ventricle has severely reduced systolic function, with an ejection fraction of 20-25%. The cavity size was normal. There is moderately increased left ventricular wall thickness. Left ventricular diastolic function could not be evaluated  secondary to atrial fibrillation. Left ventricular diffuse hypokinesis.  2. Left atrial  size was severely dilated.  3. The mitral valve is abnormal. Mild thickening of the mitral valve leaflet. Mitral valve regurgitation is mild to moderate by color flow Doppler.  4. The tricuspid valve is grossly normal. Tricuspid valve regurgitation is mild-moderate.  5. The aortic valve is tricuspid. Moderate calcification of the aortic valve. Aortic valve regurgitation is mild by color flow Doppler. Moderate stenosis of the aortic valve.  6. The aorta is normal unless otherwise noted.  7. The inferior vena cava was dilated in size with >50% respiratory variability.  Patient Profile     85 y.o. male with a hx of tachybrady, chronic afib on Eliquis, ASCAD S/P PCI of LAD 2009, HTN, HLD and Parkinsons.  Admitted after near syncopal episode.  Apparently has had several episodes of sudden dizziness, tremors and Dyspnea over the past weeks.  In ER EKg with afib with CVR, BNP 1289, HSTrop 38>37.  Given IV Lasix, elevated ddimer with VQ pending.  He has a hx of mild AS and normal LVF by echo a year ago. He was orthostatic in the ER.   Assessment & Plan    1.  Paroxysmal atrial fibrillation with RVR  : -Has a long history of tachybradycardia syndrome and paroxysmal atrial fibrillation for which he has been well maintained on anticoagulation with low-dose Eliquis.   -Not on AV nodal blocking agents secondary to history of bradycardia in the postconversion setting dating back to 2015.  On ED arrival, BNP noted to be elevated likely in the setting of atrial fibrillation with RVR with rates in the 90s. -HR much better controlled this am -TSH within normal limits -2D echo with severe LV dysfunction ? Tachy mediated   2.  Acute on chronic combined systolic/diastolic CHF: -Last echocardiogram from 2019 with normal LV function with severe focal thickening and calcification involving the right coronary cusp, mild aortic stenosis and moderate regurgitation with a mean gradient of 8 mmHg -Likely acute volume overload in the setting of atrial fibrillation with RVR and new LV dysfunction -2D echo with severe LV dysfunction ? Tachy mediated -Does not appear to be overtly fluid volume overloaded on exam today -unfortunately soft BP limits standard HF meds.  -given orthostatic BP would avoid hydralazine/nitrates for preload/afterload reduction -he has mild CKD but would like to start ARB - will need to follow renal function closely -will try low dose Losartan 12.5mg  daily -no BB due to hx of severe bradycardia with post conversion pauses in 2015 -given advanced age with dementia and Parkinsons with significant debilitation, would not recommend further ischemic workup and not a candidate for advanced therapies due to advanced age.  -given debilitated state, dementia and Parkinson and difficulty taking care of himself, would consider Palliative Care consult.  3. Near syncopal episode/Moderate AS: -Unclear etiology. Per chart review and RN report, patient was at home and was getting up from his chair to go outside when he experienced a near syncopal event. During the episode he was noted to have had tremors  and some dyspnea. Son reports he has had multiple episodes in the last several weeks -No arrhthymias on monitor  -orthostatic in ER -? Arrhythmia given new LV dysfunction -echo showed mild AS by mean gradient and peak velocity but may be underestimated in setting of severe LV dysfunction.  His dimensionless index and AVA are more consistent with moderate AS. I personally reviewed the images and there is thickening and calcification of the AV with immobility of the RCC and LCC and visually  appears to be moderate AS.  -given advanced age and dementia with debilitation due to Parkinsons he would not be a candidate for TAVR. -will get event monitor on discharge  4.  History of CAD: -Remote history of PCI to LAD in 2009 with no recurrent ischemic cardiac issues since that time -EKG with atrial fibrillation, HR 91 bpm with no ischemic changes -Denies anginal pain -No ASA in the setting of low-dose Eliquis -Not on home statin -No beta-blocker in the setting of history of bradycardia in the postconversion setting  5.  HLD: -Last LDL, 108 on 09/06/2012 -We will repeat lab work today for secondary risk modification   6.  History of /tachycardia/bradycardia syndrome: -Was previously evaluated for bradycardia in the post conversion setting in 2015 with heart rates in the 30 to 40 bpm range.  EP evaluated at that time and initially thought to need PPM implantation however this was eventually deferred given stabilization. -AV nodal blocking agents have been avoided since that time -Electrolyes WNL    7. Dementia with Parkinson's Disease: -Had initial confusion prior to ED arrival in the setting of near syncopal episode.  -Has baseline dementia>>on Aricept, Sinemet    For questions or updates, please contact [place] Please consult [email or web address] for contact info under Cardiology/STEMI.      Signed, Fransico Him, MD  10/23/2018, 8:12 AM

## 2018-10-23 NOTE — Consult Note (Signed)
Consultation Note Date: 10/23/2018   Patient Name: Henry Alvarez  DOB: [birth year]  MRN: [id number]  Age / Sex: 85 y.o., male  PCP: Reynold Bowen, MD Referring Physician: Little Ishikawa, MD  Reason for Consultation: Establishing goals of care  HPI/Patient Profile: 85 y.o. male  with past medical history of Parkinsons, dementia, a fib, CAD, tachy brady syndrome, HTN, HLD, insomnia, and anemia admitted on 10/21/2018 with tremors and syncope. Was found to be mildly hypoxic and in a fib. BNP was 1289. VQ scan negative. Good results with IV lasix.  CT head negative. PMT consulted d/t adult failure to thrive, frailty, and multiple significant comorbidities.   Clinical Assessment and Goals of Care: I have reviewed medical records including EPIC notes, labs and imaging, and received report from RN - RN reports patient is eating well, weaned off oxygen, and will possibly be discharged tomorrow.   I attempted to speak with patient's son - no answer or return call. Voicemail left with call back number. Per chart review, patient lives with his son.   Chart review reveals that patient has lost about 22 pounds since January. Patient has stabilized from acute exacerbation but does need ongoing discussion about overall goals of care. PT recommends home health. Unclear if patient is hospice eligible yet - would like to discuss baseline status with son further. Code status also needs to be addressed. Would suggest palliative care evaluate outpatient. Will consult case management.   Primary Decision Maker NEXT OF KIN    SUMMARY OF RECOMMENDATIONS    - unable to reach son, suggest outpatient palliative care referral - per RN, likely d/c tomorrow? Will consult case management for palliative outpatient and please write in discharge summary  Code Status/Advance Care Planning:  Full code - needs to be addressed  Prognosis:   Unable to determine   Discharge Planning: Home with Home Health ?     Primary Diagnoses: Present on Admission: . Syncope . Paroxysmal atrial fibrillation (HCC) . Parkinson's disease ([place]) . BPH (benign prostatic hyperplasia) . Acute diastolic CHF (congestive heart failure) ([place])   I have reviewed the medical record, interviewed the patient and family, and examined the patient. The following aspects are pertinent.  Past Medical History:  Diagnosis Date  . Anemia   . Arthritis   . Atrial fibrillation ([place])   . BPH (benign prostatic hyperplasia)   . C. difficile colitis    hx of cdiff last fall 2016   . Dementia ([place])   . Depression   . Hypertension   . Iron deficiency anemia   . Parkinson disease ([place])   . Thrombocytopenia ([place])    Social History   Socioeconomic History  . Marital status: Widowed    Spouse name: Not on file  . Number of children: 2  . Years of education: Not on file  . Highest education level: Not on file  Occupational History  . Occupation: Retired Warehouse manager: RETIRED  Social Needs  . Financial resource strain: Not on file  . Food insecurity    Worry: Not on file    Inability: Not on file  . Transportation needs    Medical: Not on file    Non-medical: Not on file  Tobacco Use  . Smoking status: Never Smoker  . Smokeless tobacco: Never Used  Substance and Sexual Activity  . Alcohol use: No    Alcohol/week: 0.0 standard drinks    Comment: 09/05/2012 "rarely; I had a glass of wine ~  1 month ago"  . Drug use: No  . Sexual activity: Not Currently  Lifestyle  . Physical activity    Days per week: Not on file    Minutes per session: Not on file  . Stress: Not on file  Relationships  . Social Herbalist on phone: Not on file    Gets together: Not on file    Attends religious service: Not on file    Active member of club or organization: Not on file    Attends meetings of clubs or organizations: Not on file    Relationship status: Not  on file  Other Topics Concern  . Not on file  Social History Narrative   Two level lives with family; Right handed; iced tea 1 glass / day   Family History  Problem Relation Age of Onset  . Heart attack Father   . Angina Father   . Heart failure Mother   . Hypertension Sister   . Heart disease Other        grandmother  . Colon cancer Neg Hx   . Esophageal cancer Neg Hx   . Kidney disease Neg Hx   . Liver disease Neg Hx    Scheduled Meds: . apixaban  2.5 mg Oral BID  . carbidopa-levodopa  1 tablet Oral QHS  . carbidopa-levodopa  2 tablet Oral QID  . donepezil  10 mg Oral QHS  . iron polysaccharides  150 mg Oral Daily  . latanoprost  1 drop Both Eyes QHS  . mouth rinse  15 mL Mouth Rinse BID  . Melatonin  3 mg Oral QHS  . multivitamin with minerals  1 tablet Oral Daily  . PARoxetine  5 mg Oral QHS  . polyethylene glycol  17 g Oral Daily  . saccharomyces boulardii  250 mg Oral Q1400  . tamsulosin  0.4 mg Oral Daily   Continuous Infusions: PRN Meds:.acetaminophen **OR** acetaminophen, hydrALAZINE, levalbuterol, ondansetron **OR** ondansetron (ZOFRAN) IV, Resource ThickenUp Clear Allergies  Allergen Reactions  . Quinolones Other (See Comments)    Weakness, painful joints, lethergy    Vital Signs: BP 115/78 (BP Location: Left Arm)   Pulse 88   Temp 97.6 F (36.4 C) (Oral)   Resp (!) 25   Ht 5\' 5"  (1.651 m)   Wt 69.4 kg   SpO2 99%   BMI 25.46 kg/m  Pain Scale: 0-10   Pain Score: 0-No pain   SpO2: SpO2: 99 % O2 Device:SpO2: 99 % O2 Flow Rate: .O2 Flow Rate (L/min): 2 L/min  IO: Intake/output summary:   Intake/Output Summary (Last 24 hours) at 10/23/2018 1417 Last data filed at 10/23/2018 1000 Gross per 24 hour  Intake 200 ml  Output 510 ml  Net -310 ml    LBM: Last BM Date: 10/22/18 Baseline Weight: Weight: 73 kg Most recent weight: Weight: 69.4 kg     Palliative Assessment/Data: PPS 40%    The above conversation was completed via telephone due to  the visitor restrictions during the COVID-19 pandemic. Thorough chart review and discussion with necessary members of the care team was completed as part of assessment. All issues were discussed and addressed but no physical exam was performed.  Time Total: 40 minutes Greater than 50%  of this time was spent counseling and coordinating care related to the above assessment and plan.  Juel Burrow, DNP, AGNP-C Palliative Medicine Team [phone] Pager: [phone]

## 2018-10-23 NOTE — Progress Notes (Signed)
Physical Therapy Treatment Patient Details Name: Henry Alvarez MRN: [id number] DOB: November 05, 1924 Today's Date: 10/23/2018    History of Present Illness Patient is a 85 y/o male who was admitted due to syncopal episode. BNP elevated at 1289 consistent with acute diastolic CHF. Workup pending. PMH includes PD, Depression, dementia, CAD, Pafib, HTN, TIA.    PT Comments    Pt performed gt training and functional mobility during session this pm.  He is very impulsive and presents with poor safety awareness when using RW.  Based on his safety concerns he will require assistance at home to mobilize.  Continue to recommend HHPT at d/c if 24 hr assistance remains available.  If he does not have 24 hr assistance he will require SNF placement until strength and balance improves.    Follow Up Recommendations  Home health PT;Supervision for mobility/OOB;Supervision/Assistance - 24 hour     Equipment Recommendations  None recommended by PT    Recommendations for Other Services       Precautions / Restrictions Precautions Precautions: Fall Precaution Comments: watch HR and 02 Restrictions Weight Bearing Restrictions: No    Mobility  Bed Mobility Overal bed mobility: Needs Assistance Bed Mobility: Supine to Sit;Sit to Supine     Supine to sit: Min assist;HOB elevated     General bed mobility comments: Good initiation of bringing LEs to EOB, needed assist with getting them off bed and scooting bottom to get feet on floor. Increased time and effort. No dizziness.  Pt presents with posterior pelvic tilt and posterior lean.  Transfers Overall transfer level: Needs assistance Equipment used: Rolling walker (2 wheeled) Transfers: Sit to/from Stand Sit to Stand: Min assist;From elevated surface         General transfer comment: Assist to power to standing with use of momentum, posterior lean initially but able to self correct with time. Stood from [employer], transferred to chair post  ambulation. min guard when standing from chair with arm rests at sink  Ambulation/Gait Ambulation/Gait assistance: Min assist Gait Distance (Feet): 65 Feet Assistive device: Rolling walker (2 wheeled) Gait Pattern/deviations: Step-through pattern;Decreased stride length;Trunk flexed;Shuffle Gait velocity: decreased   General Gait Details: Slow, mildly unsteady gait with RW for support; 3/4 DOE. Sp02 on 2L and remained in 90s. HR ranged from 100-148 bpm.   Stairs             Wheelchair Mobility    Modified Rankin (Stroke Patients Only)       Balance Overall balance assessment: Needs assistance Sitting-balance support: Feet supported;No upper extremity supported Sitting balance-Leahy Scale: Fair     Standing balance support: During functional activity Standing balance-Leahy Scale: Poor Standing balance comment: dependent on RW, improved from previous session                            Cognition Arousal/Alertness: Awake/alert Behavior During Therapy: WFL for tasks assessed/performed;Impulsive Overall Cognitive Status: History of cognitive impairments - at baseline                                 General Comments: Pt oriented this session, telling jokes, son present and saying this is typical behavior - Pt very pleasant and enjoyable      Exercises      General Comments General comments (skin integrity, edema, etc.): HR elevated to 148 with activity, RN aware. Son "[name]" present throughout session  Pertinent Vitals/Pain Pain Assessment: No/denies pain Faces Pain Scale: No hurt    Home Living Family/patient expects to be discharged to:: Private residence Living Arrangements: Children(son and daughter) Available Help at Discharge: Personal care attendant(3-4 hrs per day 3-4 days/week) Type of Home: House Home Access: Stairs to enter Entrance Stairs-Rails: Right Home Layout: Two level;Bed/bath upstairs Home Equipment: Walker - 2  wheels;Shower seat;Cane - single point      Prior Function Level of Independence: Needs assistance  Gait / Transfers Assistance Needed: Uses RW or SPC for ambulation. ADL's / Homemaking Assistance Needed: PCA assists with ADL and IADL.     PT Goals (current goals can now be found in the care plan section) Acute Rehab PT Goals Patient Stated Goal: to get out of here Potential to Achieve Goals: Fair Progress towards PT goals: Progressing toward goals    Frequency    Min 3X/week      PT Plan Current plan remains appropriate    Co-evaluation PT/OT/SLP Co-Evaluation/Treatment: Yes Reason for Co-Treatment: Complexity of the patient's impairments (multi-system involvement) PT goals addressed during session: Mobility/safety with mobility OT goals addressed during session: ADL's and self-care      AM-PAC PT "6 Clicks" Mobility   Outcome Measure  Help needed turning from your back to your side while in a flat bed without using bedrails?: A Little Help needed moving from lying on your back to sitting on the side of a flat bed without using bedrails?: A Lot Help needed moving to and from a bed to a chair (including a wheelchair)?: A Lot Help needed standing up from a chair using your arms (e.g., wheelchair or bedside chair)?: A Lot Help needed to walk in hospital room?: A Little Help needed climbing 3-5 steps with a railing? : A Lot 6 Click Score: 14    End of Session Equipment Utilized During Treatment: Gait belt;Oxygen Activity Tolerance: Patient limited by fatigue Patient left: in chair;with call bell/phone within reach;with family/visitor present Nurse Communication: Mobility status;Other (comment) PT Visit Diagnosis: Unsteadiness on feet (R26.81);Muscle weakness (generalized) (M62.81);Difficulty in walking, not elsewhere classified (R26.2)     Time: [id number] PT Time Calculation (min) (ACUTE ONLY): 34 min  Charges:  $Gait Training: 8-22 mins                     Governor Rooks, PTA Acute Rehabilitation Services Pager [phone] Office [phone]     Oryn Casanova Eli Hose 10/23/2018, 5:28 PM

## 2018-10-24 ENCOUNTER — Telehealth: Payer: Self-pay | Admitting: Neurology

## 2018-10-24 DIAGNOSIS — G20 Parkinson's disease: Secondary | ICD-10-CM

## 2018-10-24 DIAGNOSIS — Z66 Do not resuscitate: Secondary | ICD-10-CM

## 2018-10-24 DIAGNOSIS — N183 Chronic kidney disease, stage 3 (moderate): Secondary | ICD-10-CM

## 2018-10-24 DIAGNOSIS — F0280 Dementia in other diseases classified elsewhere without behavioral disturbance: Secondary | ICD-10-CM

## 2018-10-24 DIAGNOSIS — N179 Acute kidney failure, unspecified: Secondary | ICD-10-CM

## 2018-10-24 DIAGNOSIS — N400 Enlarged prostate without lower urinary tract symptoms: Secondary | ICD-10-CM

## 2018-10-24 DIAGNOSIS — I10 Essential (primary) hypertension: Secondary | ICD-10-CM

## 2018-10-24 DIAGNOSIS — Z515 Encounter for palliative care: Secondary | ICD-10-CM

## 2018-10-24 LAB — CBC
HCT: 40.3 % (ref 39.0–52.0)
Hemoglobin: 13.4 g/dL (ref 13.0–17.0)
MCH: 31.5 pg (ref 26.0–34.0)
MCHC: 33.3 g/dL (ref 30.0–36.0)
MCV: 94.6 fL (ref 80.0–100.0)
Platelets: 101 10*3/uL — ABNORMAL LOW (ref 150–400)
RBC: 4.26 MIL/uL (ref 4.22–5.81)
RDW: 15.6 % — ABNORMAL HIGH (ref 11.5–15.5)
WBC: 6 10*3/uL (ref 4.0–10.5)
nRBC: 0 % (ref 0.0–0.2)

## 2018-10-24 LAB — BASIC METABOLIC PANEL
Anion gap: 10 (ref 5–15)
BUN: 25 mg/dL — ABNORMAL HIGH (ref 8–23)
CO2: 23 mmol/L (ref 22–32)
Calcium: 8.8 mg/dL — ABNORMAL LOW (ref 8.9–10.3)
Chloride: 103 mmol/L (ref 98–111)
Creatinine, Ser: 1.25 mg/dL — ABNORMAL HIGH (ref 0.61–1.24)
GFR calc Af Amer: 58 mL/min — ABNORMAL LOW (ref >60)
GFR calc non Af Amer: 50 mL/min — ABNORMAL LOW (ref >60)
Glucose, Bld: 89 mg/dL (ref 70–99)
Potassium: 3.7 mmol/L (ref 3.5–5.1)
Sodium: 136 mmol/L (ref 135–145)

## 2018-10-24 LAB — BLOOD GAS, ARTERIAL
Acid-Base Excess: 3 mmol/L — ABNORMAL HIGH (ref 0.0–2.0)
Bicarbonate: 27 mmol/L (ref 20.0–28.0)
Drawn by: 54887
FIO2: 21
O2 Saturation: 90.7 %
Patient temperature: 98.6
pCO2 arterial: 40.9 mmHg (ref 32.0–48.0)
pH, Arterial: 7.435 (ref 7.350–7.450)
pO2, Arterial: 60.7 mmHg — ABNORMAL LOW (ref 83.0–108.0)

## 2018-10-24 NOTE — Telephone Encounter (Signed)
Patient son needs to talk to someone about his father being in the hospital at [facility]. He has been there since Monday on a heart related issues but he is concerned due to his other medical issues that Tomi Likens sees patients for

## 2018-10-24 NOTE — Progress Notes (Signed)
Physical Therapy Treatment Patient Details Name: Henry Alvarez MRN: [id number] DOB: 1924/08/09 Today's Date: 10/24/2018    History of Present Illness Patient is a 85 y/o male who was admitted due to syncopal episode. BNP elevated at 1289 consistent with acute diastolic CHF. Workup pending. PMH includes PD, Depression, dementia, CAD, Pafib, HTN, TIA.    PT Comments    Pt on arrival supine in bed and agitated with RN during med pass.  Attempted to ask cognition questions and he became more agitated.  Switched gears to focus on transfer training OOB and into the recliner and he was more receptive to mobility and followed commands from PTA.  Pt limited to transfer to chair as HR elevated to 150 bpm. Pt seated in recliner post session eating his breakfast.  Will continue to recommend HHPT with 24 hr assistance from his family.  If family cannot provide this assistance he will require SNF placement.  Plan next session to progress activity to patient tolerance.     Follow Up Recommendations  Home health PT;Supervision for mobility/OOB;Supervision/Assistance - 24 hour(if patient does not have adequate support from family he will require SNF placement.)     Equipment Recommendations  None recommended by PT    Recommendations for Other Services       Precautions / Restrictions Precautions Precautions: Fall Precaution Comments: watch HR and 02 Restrictions Weight Bearing Restrictions: No    Mobility  Bed Mobility Overal bed mobility: Needs Assistance Bed Mobility: Supine to Sit     Supine to sit: Mod assist;+2 for safety/equipment     General bed mobility comments: Increased assistance to move to edge of bed. Use of bed pad to scoot to edge of bed.  Transfers Overall transfer level: Needs assistance Equipment used: Rolling walker (2 wheeled) Transfers: Sit to/from Stand Sit to Stand: Min assist;+2 safety/equipment;From elevated surface         General transfer comment:  Assistance to boost into standing from elevated surface.  Cues for hand placement and hand over hand, hand placement to reach back for seated surface to sit in the recliner.  Ambulation/Gait Ambulation/Gait assistance: Min assist;+2 safety/equipment Gait Distance (Feet): 6 Feet Assistive device: Rolling walker (2 wheeled) Gait Pattern/deviations: Shuffle;Trunk flexed Gait velocity: decreased   General Gait Details: Pt performed slow shuffling steps from bed to recliner.  Series of side stepping involved and assistance to maneuver RW during session.   Stairs             Wheelchair Mobility    Modified Rankin (Stroke Patients Only)       Balance Overall balance assessment: Needs assistance   Sitting balance-Leahy Scale: Fair       Standing balance-Leahy Scale: Poor Standing balance comment: reliant on B UE support and external assistance during dynamic gt activities.                            Cognition Arousal/Alertness: Lethargic Behavior During Therapy: Agitated Overall Cognitive Status: History of cognitive impairments - at baseline                                 General Comments: Pt only oriented to self this am.  He reports, "leave my kids out of this." when asked if his son was going to visit today.  Pt combative with RN during med pass.      Exercises  General Comments        Pertinent Vitals/Pain Pain Assessment: Faces Faces Pain Scale: No hurt    Home Living                      Prior Function            PT Goals (current goals can now be found in the care plan section) Acute Rehab PT Goals Patient Stated Goal: to get out of here PT Goal Formulation: With patient Potential to Achieve Goals: Fair Progress towards PT goals: Progressing toward goals    Frequency    Min 3X/week      PT Plan Current plan remains appropriate    Co-evaluation              AM-PAC PT "6 Clicks" Mobility    Outcome Measure  Help needed turning from your back to your side while in a flat bed without using bedrails?: A Little Help needed moving from lying on your back to sitting on the side of a flat bed without using bedrails?: A Lot Help needed moving to and from a bed to a chair (including a wheelchair)?: A Little Help needed standing up from a chair using your arms (e.g., wheelchair or bedside chair)?: A Little Help needed to walk in hospital room?: A Little Help needed climbing 3-5 steps with a railing? : A Lot 6 Click Score: 16    End of Session Equipment Utilized During Treatment: Gait belt Activity Tolerance: Patient limited by fatigue Patient left: in chair;with call bell/phone within reach;with family/visitor present Nurse Communication: Mobility status;Other (comment) PT Visit Diagnosis: Unsteadiness on feet (R26.81);Muscle weakness (generalized) (M62.81);Difficulty in walking, not elsewhere classified (R26.2)     Time: [id number] PT Time Calculation (min) (ACUTE ONLY): 15 min  Charges:  $Therapeutic Activity: 8-22 mins                     Governor Rooks, PTA Acute Rehabilitation Services Pager [phone] Office [phone]     Arlicia Paquette Eli Hose 10/24/2018, 10:23 AM

## 2018-10-24 NOTE — Progress Notes (Signed)
Pt has been having some sleep apnea during night while sleeping.  Apnea periods lasting no longer than 15 seconds, sats dropping no lower than 86% on RA then quickly returns to 95% or better.

## 2018-10-24 NOTE — Plan of Care (Signed)
Pt had small bouts of confusion and attempting to get out of bed for something.  Nurse able to redirect and reorient pt.  Pt has periods of apnea, dropping sats to 87% then back up to > 92% on RA.  Monitor showed Afib rates 80 to 100s with occasional to rare PVCs.    Problem: Clinical Measurements: Goal: Respiratory complications will improve Outcome: Progressing   Problem: Clinical Measurements: Goal: Cardiovascular complication will be avoided Outcome: Progressing   Problem: Activity: Goal: Risk for activity intolerance will decrease Outcome: Progressing   Problem: Nutrition: Goal: Adequate nutrition will be maintained Outcome: Progressing   Problem: Coping: Goal: Level of anxiety will decrease Outcome: Progressing

## 2018-10-24 NOTE — Progress Notes (Signed)
  Speech Language Pathology Treatment: Dysphagia  Patient Details Name: Henry Alvarez MRN: [id number] DOB: Nov 12, 1924 Today's Date: 10/24/2018 Time: [id number] SLP Time Calculation (min) (ACUTE ONLY): 9 min  Assessment / Plan / Recommendation Clinical Impression  Attempted to see pt with breakfast tray. He was asleep upon SLP arrival and had difficulty sustaining alertness for any significant amount of PO intake. SLP provided assistance with repositioning, with pt needing support to hold an upright position in the bed. He responded "yes" when offered boluses of juice, which were administered nectar thick and via spoon, but his sustained attention was poor in the setting of lethargy. No overt s/s of aspiration were noted, but Max cues were provided to facilitate oral holding/prolonged transit. Additional, larger boluses were deferred, as were solids, pending increased alertness. Would continue current diet as able.   HPI HPI: 85 y.o. male with history of Parkinson's disease, atrial fibrillation, CAD, iron deficiency anemia, dementia, chronic thrombocytopenia was brought to the ER after patient had a brief syncopal episode during which patient also had some tremors and was experiencing shortness of breath.  Hx MBS May 2016 which revealed aspiration of thin /nectar-thick liquids.  Regular diet with nectar thick liquids and chin tuck was recommended at the time.      SLP Plan  Continue with current plan of care       Recommendations  Diet recommendations: Dysphagia 2 (fine chop);Nectar-thick liquid Liquids provided via: Cup Medication Administration: Crushed with puree Supervision: Staff to assist with self feeding;Full supervision/cueing for compensatory strategies Compensations: Slow rate;Small sips/bites;Chin tuck Postural Changes and/or Swallow Maneuvers: Seated upright 90 degrees                Oral Care Recommendations: Oral care BID Follow up Recommendations: Home health SLP;24  hour supervision/assistance SLP Visit Diagnosis: Dysphagia, unspecified (R13.10) Plan: Continue with current plan of care       GO                Henry Alvarez 10/24/2018, 9:27 AM  Henry Alvarez, M.A. [place] Acute Environmental education officer [phone] Office [phone]

## 2018-10-24 NOTE — Progress Notes (Signed)
Daily Progress Note   Patient Name: Henry Alvarez       Date: 10/24/2018 DOB: [birth year]  Age: 85 y.o. MRN#: [id number] Attending Physician: Allie Bossier, MD Primary Care Physician: Reynold Bowen, MD Admit Date: 10/21/2018  Reason for Consultation/Follow-up: Establishing goals of care  Subjective: Met with patient and his children at bedside and then met with his children and Dr. Sherral Hammers in a private conference room.  We discussed the patient's current medical state in detail.  He's had a rapid neurological decline largely due to exacerbation of his other co-morbidities including Afib/RVR, CHF (EF 20-25%), and worsening of his parkinsons dementia. This was difficult for his children to understand.  We reviewed his medications and made recommendations to minimize medications.    We discussed next steps.  The family is fortunate enough to be able to keep Mr. Anastacio at home.  I fully supported this idea as his dementia would likely worsen significantly if he was placed in SNF.  We discussed what support he would need at home.  After talking about the options the patient's children accepted Hospice services in his home.  Code status was discussed.  As a protective measure, Mr. Worth children opted for DNR.   Assessment: Patient with some confusion, significantly weakened   Patient Profile/HPI:  85 y.o. male  with past medical history of Parkinsons, dementia, a fib, CAD, tachy brady syndrome, HTN, HLD, insomnia, and anemia admitted on 10/21/2018 with tremors and syncope. Was found to be mildly hypoxic and in a fib. BNP was 1289. VQ scan negative. Good results with IV lasix.  CT head negative. PMT consulted d/t adult failure to thrive, frailty, and multiple significant comorbidities.    Length of Stay: 2  Current Medications: Scheduled Meds:  . apixaban  2.5 mg Oral BID  . carbidopa-levodopa  1 tablet Oral QHS  . carbidopa-levodopa  2 tablet Oral QID  . donepezil  10 mg Oral QHS  . iron polysaccharides  150 mg Oral Daily  . latanoprost  1 drop Both Eyes QHS  . losartan  12.5 mg Oral Daily  . mouth rinse  15 mL Mouth Rinse BID  . Melatonin  3 mg Oral QHS  . multivitamin with minerals  1 tablet Oral Daily  . PARoxetine  5 mg Oral QHS  . polyethylene  glycol  17 g Oral Daily  . saccharomyces boulardii  250 mg Oral Q1400  . tamsulosin  0.4 mg Oral Daily    Continuous Infusions:   PRN Meds: acetaminophen **OR** acetaminophen, hydrALAZINE, levalbuterol, ondansetron **OR** ondansetron (ZOFRAN) IV, Resource ThickenUp Clear  Physical Exam        Frail elderly male, awake, alert, speaks in a whisper  NAD CV irreg irreg Resp no distress Abdomen soft, nt, nd +BS  Vital Signs: BP (!) 117/92 (BP Location: Left Arm)   Pulse 94   Temp (!) 97.4 F (36.3 C) (Oral)   Resp (!) 24   Ht _0  (1.651 m)   Wt 69.8 kg   SpO2 97%   BMI 25.61 kg/m  SpO2: SpO2: 97 % O2 Device: O2 Device: Room Air O2 Flow Rate: O2 Flow Rate (L/min): 2 L/min  Intake/output summary:   Intake/Output Summary (Last 24 hours) at 10/24/2018 2012 Last data filed at 10/24/2018 1933 Gross per 24 hour  Intake 120 ml  Output 975 ml  Net -855 ml   LBM: Last BM Date: 10/22/18 Baseline Weight: Weight: 73 kg Most recent weight: Weight: 69.8 kg       Palliative Assessment/Data: 20%    Flowsheet Rows     Most Recent Value  Intake Tab  Referral Department  Hospitalist  Unit at Time of Referral  Cardiac/Telemetry Unit  Palliative Care Primary Diagnosis  Cardiac  Date Notified  10/22/18  Palliative Care Type  New Palliative care  Reason for referral  Clarify Goals of Care  Date of Admission  10/21/18  Date first seen by Palliative Care  10/23/18  # of days Palliative referral response time   1 Day(s)  # of days IP prior to Palliative referral  1  Clinical Assessment  Palliative Performance Scale Score  40%  Psychosocial & Spiritual Assessment  Palliative Care Outcomes  Patient/Family meeting held?  No  Palliative Care Outcomes  Linked to palliative care logitudinal support      Patient Active Problem List   Diagnosis Date Noted  . Palliative care by specialist   . Acute diastolic CHF (congestive heart failure) ([place]) 10/22/2018  . Acute CHF (congestive heart failure) ([place]) 10/21/2018  . Syncope 10/21/2018  . Benign essential HTN 03/26/2015  . BPH (benign prostatic hyperplasia) 03/26/2015  . IDA (iron deficiency anemia) 03/26/2015  . Acute respiratory failure with hypoxia ([place]) 03/26/2015  . Thrombocytopenia ([place]) 11/02/2014  . Enteritis due to Clostridium difficile 09/22/2013  . Depression 09/22/2013  . Parkinson's disease ([place]) 08/20/2012  . Paroxysmal atrial fibrillation ([facility])   . Long term current use of anticoagulant therapy     Palliative Care Plan    Recommendations/Plan:  Code status changed to DNR  Recommend discontinuing aricept as it is effective in early dementia but not moderate or advanced dementia, further it contributes to slowed heart rate and falling.  Hospice services in the home [facility]) after he is medically optimized.  Goals of Care and Additional Recommendations:  Limitations on Scope of Treatment: Treat the treatable with non-invasive interventions.  Focus is on comfort.  Code Status:  DNR  Prognosis:   < 6 months given CHF EF 20-25%, parkinsons with parkinsons dementia, Afib with RVR, 22 lb weight loss in 6 months, dysphagia with aspiration.  Discharge Planning:  Home with hospice    Care plan was discussed with MD, Family  Thank you for allowing the Palliative Medicine Team to assist in the care of this patient.  Total  time spent:  65 min.     Greater than 50%  of this time was spent counseling and coordinating care  related to the above assessment and plan.  Florentina Jenny, PA-C Palliative Medicine  Please contact Palliative MedicineTeam phone at [phone] for questions and concerns between 7 am - 7 pm.   Please see AMION for individual provider pager numbers.

## 2018-10-24 NOTE — Progress Notes (Signed)
PROGRESS NOTE    Henry Alvarez  [id number] DOB: 02/22/1924 DOA: 10/21/2018 PCP: Reynold Bowen, MD   Brief Narrative:  85 y.o. WM PMHx Parkinson's disease,  Dementia, atrial fibrillation, CAD, iron deficiency anemia, chronic thrombocytopenia   Brought to the ER after patient had a brief syncopal episode during which patient also had some tremors and was experiencing shortness of breath.  As per patient son patient does have shortness of breath chronically from his weakness from Parkinson's disease.  Has not had any chest pain fever chills nausea vomiting or diarrhea.  Has not had any productive cough.  Today around 2 PM when patient was trying to go out in the cough patient suddenly felt weak and passed out for a minute or 2 following which patient appeared confused but did not have any incontinence of urine or bowel.  The upper part of the extremities had tremors.  Patient was brought to the ER.  As per patient's son only new medication started was Flexeril by patient's neurologist last week for sleeping.  ED Course: In the ER patient was appearing at back at baseline.  Mildly hypoxic.  Chest x-ray unremarkable.  COVID-19 test was negative.  EKG showed A. fib with rate around 90 bpm.  Patient's labs show BNP of 1289.  Patient was given 1 dose of Lasix 40 mg IV following which patient had good diuresis.  At the time of my exam patient is not in distress and patient admitted for further observation.  Other labs show hemoglobin of 12.8 platelets 95 creatinine 1.3.  D-dimer was mildly elevated 0.73 CT head unremarkable.   Subjective: 9/17 A/O x2 (does not know where, why).  Positive fatigue, feels as if he is cognition is below baseline..  States saw his neurologist 2 to 3 weeks ago who made minor adjustments to his Parkinson's medication, does not recall neurologist name.  States was told he may need home O2 but currently does not use.   Assessment & Plan:   Principal Problem:   Acute CHF  (congestive heart failure) (HCC) Active Problems:   Paroxysmal atrial fibrillation (HCC)   Long term current use of anticoagulant therapy   Parkinson's disease (HCC)   BPH (benign prostatic hyperplasia)   Syncope   Acute diastolic CHF (congestive heart failure) ([place])   Palliative care by specialist   Dementia due to Parkinson's disease without behavioral disturbance ([place])   DNR (do not resuscitate)   Palliative care encounter   Acute on chronic combined systolic and diastolic CHF (congestive heart failure) ([place])   Essential hypertension   Parkinsons disease ([place])   Acute renal failure superimposed on stage 3 chronic kidney disease ([place])   Adult failure to thrive  Acute respiratory failure with hypoxia - Multifactorial CHF?/A. fib with RVR -Treat underlying causes -VQ scan negative -Resolved  Tachycardia/bradycardia syndrome - Patient evaluated by cardiology - Previously evaluated for bradycardia in the postconversion setting in 2015 with heart rates in the 30s to 40 bpm range. - EP evaluated at that time initially thought to need PPM implantation however this was deferred given stabilization - AV nodal blocking agents have been avoided since that time - Discussed case with Dr. Radford Pax cardiology feel there is no further reasonable cardiac intervention  Chronic systolic and diastolic CHF with exacerbation - Most likely precipitated by A. fib with RVR which has resolved. - TTE October 2019; LVEF 50 to 55%, unable to evaluate diastolic function due to A. fib - 9/15 echocardiogram  EF 20 to  25%, - Euvolemic on exam -Strict in and out -Daily weight  Paroxysmal atrial fibrillation with RVR -See tachycardia/bradycardia syndrome -A. fib with RVR was noted up to 140s to 150s, now Rate controlled -Continue apixaban  CAD s/p PCI - Cannot use nodal blocking agents secondary to patient's tachycardia/bradycardia syndrome - Cannot use aspirin secondary to patient being on Eliquis -  Not on statin, will discuss with children prior to discharge risk and benefits of starting now given his advanced age.  Essential HTN -Currently controlled would not add any additional agents given patient's Parkinson's disease and age would increase risk of fall  Syncope/near syncopal event - Most likely multifactorial, tachycardia/bradycardia syndrome, advanced Parkinson's disease - EEG negative for seizure activity, see results below - PT eval pending  Parkinson's disease -Followed by Dr. Metta Clines, [facility].  Last seen on 9/4  - Per Dr. Tomi Likens note at time of exam on 9/4 patient was positive weaker, with legs sometimes giving out. - Dysphagia present - Dr. Tomi Likens also added cyclobenzaprine 5 mg QHS (discussed patient may become more confused) - They were to follow-up in 4 months. Continue Sinemet at home dose.Due to concern for worsening tremor/mental status changes, holding Flexeril and Ultracet.   Dysphasia  -Most likely secondary to advancing Parkinson's disease -Awaiting swallow study in order to advance patient's diet  Dementia/depression -On Aricept however do not believe is doing patient any further good considering he has been on it 3 to 4 years per his children.  Since patient will be going home on hospice will most likely discontinue. -Continue Paxil 5 mg daily  Acute on CKD stage III (baseline Cr 1.3) Recent Labs  Lab 10/21/18 1700 10/22/18 0753 10/23/18 0232 10/24/18 0246 10/25/18 0135  CREATININE 1.33* 1.36* 1.53* 1.25* 1.37*  - Creatinine back to baseline  BPH - At family meeting recommended that we continue tamsulosin for comfort  Chronic thrombocytopenia - Stable  Insomnia -Discontinued all sedating medication (Flexeril, tramadol) secondary to perceived altered mental status. -Continue melatonin I  Adult failure to thrive - Multifactorial; advanced Parkinson's, dementia, worsening CHF - 9/17 family meeting decided to proceed  with home with hospice.    Goals of care - 9/17 discussed case at length with Dr. Fransico Him cardiology who feels given patient's other multiple medical problems (especially Parkinson's), no further appropriate intervention for cardiology at this time.  She discussed patient's prognosis at length with son - 9/17 palliative care consult;GOC.  Family would like a face-to-face meeting with palliative care to discuss options for taking father home vs inpatient hospice     DVT prophylaxis: Eliquis Code Status: DNR Family Communication: 9/17 family meeting with son and daughter and Florentina Jenny (palliative care) after lengthy discussion of treatment options family has decided to take patient home on hospice. Disposition Plan: Home hospice   Consultants:  Palliative care Cardiology   Procedures/Significant Events:  9/15 echocardiogram; LVEF 20-25%. -Left ventricular diastolic function could not be evaluated secondary to atrial fibrillation. Left ventricular diffuse hypokinesis. -Left atrial; severely dilated. - The mitral valve; mild to moderate. -Tricuspid valve regurgitation is mild-moderate. Aortic valve Moderate stenosis of the aortic valve. 9/16 EEG;-cortical dysfunction in left temporal region, non specific to etiology.  -No seizures or epileptiform discharges    I have personally reviewed and interpreted all radiology studies and my findings are as above.  VENTILATOR SETTINGS:    Cultures   Antimicrobials: Anti-infectives (From admission, onward)   None       Devices  LINES / TUBES:      Continuous Infusions:   Objective: Vitals:   10/24/18 1933 10/25/18 0041 10/25/18 0306 10/25/18 0528  BP: (!) 117/92 (!) 128/98 (!) 137/91   Pulse: 94 96 86   Resp: (!) 24 (!) 26 14   Temp: (!) 97.4 F (36.3 C) 98.2 F (36.8 C) 98.3 F (36.8 C)   TempSrc: Oral Oral Oral   SpO2: 97% 97% 94%   Weight:    70 kg  Height:        Intake/Output Summary  (Last 24 hours) at 10/25/2018 [id number] Last data filed at 10/24/2018 2100 Gross per 24 hour  Intake 360 ml  Output 475 ml  Net -115 ml   Filed Weights   10/23/18 0500 10/24/18 0410 10/25/18 0528  Weight: 69.4 kg 69.8 kg 70 kg    Examination:  General: A/O x2 (does not know where, why), no acute respiratory distress Eyes: negative scleral hemorrhage, negative anisocoria, negative icterus ENT: Negative Runny nose, negative gingival bleeding, Neck:  Negative scars, masses, torticollis, lymphadenopathy, JVDLungs: Clear to auscultation bilaterally without wheezes or crackles Cardiovascular: Regular rate and rhythm without murmur gallop or rub normal S1 and S2 Abdomen: negative abdominal pain, nondistended, positive soft, bowel sounds, no rebound, no ascites, no appreciable mass Extremities: No significant cyanosis, clubbing, or edema bilateral lower extremities Skin: Negative rashes, lesions, ulcers Psychiatric:  Negative depression, positive anxiety, positive fatigue, negative mania, positive increased confusion Central nervous system:  Cranial nerves II through XII intact, tongue/uvula midline, all extremities muscle strength 3-4/5, sensation intact throughout, positive mild dysarthria, negative expressive aphasia, negative receptive aphasia. .     Data Reviewed: Care during the described time interval was provided by me .  I have reviewed this patient's available data, including medical history, events of note, physical examination, and all test results as part of my evaluation.   CBC: Recent Labs  Lab 10/21/18 1700 10/22/18 0753 10/23/18 0232 10/24/18 0246 10/25/18 0135  WBC 5.3 7.1 6.5 6.0 6.5  NEUTROABS 4.2  --   --   --   --   HGB 12.8* 14.0 12.8* 13.4 13.6  HCT 39.1 41.8 40.3 40.3 41.2  MCV 97.3 95.0 96.0 94.6 94.9  PLT 95* 94* 106* 101* [id number]   Basic Metabolic Panel: Recent Labs  Lab 10/21/18 1700 10/22/18 0753 10/23/18 0232 10/24/18 0246 10/25/18 0135  NA 139 141  140 136 137  K 4.1 3.6 3.6 3.7 4.1  CL 105 103 102 103 103  CO2 25 24 29 23 25   GLUCOSE 107* 95 105* 89 94  BUN 24* 21 29* 25* 25*  CREATININE 1.33* 1.36* 1.53* 1.25* 1.37*  CALCIUM 9.1 9.3 9.0 8.8* 8.9  MG  --  1.8  --   --  1.8   GFR: Estimated Creatinine Clearance: 29.9 mL/min (A) (by C-G formula based on SCr of 1.37 mg/dL (H)). Liver Function Tests: Recent Labs  Lab 10/21/18 1700 10/22/18 0753  AST 36 34  ALT 5 8  ALKPHOS 56 62  BILITOT 1.4* 1.8*  PROT 6.3* 6.6  ALBUMIN 3.5 3.8   No results for input(s): LIPASE, AMYLASE in the last 168 hours. No results for input(s): AMMONIA in the last 168 hours. Coagulation Profile: No results for input(s): INR, PROTIME in the last 168 hours. Cardiac Enzymes: No results for input(s): CKTOTAL, CKMB, CKMBINDEX, TROPONINI in the last 168 hours. BNP (last 3 results) No results for input(s): PROBNP in the last 8760 hours. HbA1C: No results for input(s):  HGBA1C in the last 72 hours. CBG: No results for input(s): GLUCAP in the last 168 hours. Lipid Profile: No results for input(s): CHOL, HDL, LDLCALC, TRIG, CHOLHDL, LDLDIRECT in the last 72 hours. Thyroid Function Tests: Recent Labs    10/22/18 0753  TSH 3.236   Anemia Panel: No results for input(s): VITAMINB12, FOLATE, FERRITIN, TIBC, IRON, RETICCTPCT in the last 72 hours. Urine analysis:    Component Value Date/Time   COLORURINE AMBER (A) 06/21/2018 1939   APPEARANCEUR HAZY (A) 06/21/2018 1939   LABSPEC 1.024 06/21/2018 1939   PHURINE 5.0 06/21/2018 1939   GLUCOSEU NEGATIVE 06/21/2018 1939   HGBUR NEGATIVE 06/21/2018 1939   BILIRUBINUR NEGATIVE 06/21/2018 1939   BILIRUBINUR small (A) 01/18/2017 1709   KETONESUR 5 (A) 06/21/2018 1939   PROTEINUR 30 (A) 06/21/2018 1939   UROBILINOGEN 1.0 01/18/2017 1709   UROBILINOGEN 0.2 12/06/2014 2045   NITRITE NEGATIVE 06/21/2018 1939   LEUKOCYTESUR NEGATIVE 06/21/2018 1939   Sepsis Labs: @LABRCNTIP (procalcitonin:4,lacticidven:4)   ) Recent Results (from the past 240 hour(s))  SARS CORONAVIRUS 2 (TAT 6-24 HRS) Nasopharyngeal Nasopharyngeal Swab     Status: None   Collection Time: 10/21/18  8:33 PM   Specimen: Nasopharyngeal Swab  Result Value Ref Range Status   SARS Coronavirus 2 NEGATIVE NEGATIVE Final    Comment: (NOTE) SARS-CoV-2 target nucleic acids are NOT DETECTED. The SARS-CoV-2 RNA is generally detectable in upper and lower respiratory specimens during the acute phase of infection. Negative results do not preclude SARS-CoV-2 infection, do not rule out co-infections with other pathogens, and should not be used as the sole basis for treatment or other patient management decisions. Negative results must be combined with clinical observations, patient history, and epidemiological information. The expected result is Negative. Fact Sheet for Patients: SugarRoll.be Fact Sheet for Healthcare Providers: https://www.woods-mathews.com/ This test is not yet approved or cleared by the [place] FDA and  has been authorized for detection and/or diagnosis of SARS-CoV-2 by FDA under an Emergency Use Authorization (EUA). This EUA will remain  in effect (meaning this test can be used) for the duration of the COVID-19 declaration under Section 56 4(b)(1) of the Act, 21 U.S.C. section 360bbb-3(b)(1), unless the authorization is terminated or revoked sooner. Performed at [facility], [place] [address], [place], [place] [address]   MRSA PCR Screening     Status: None   Collection Time: 10/22/18  5:39 AM   Specimen: Nasal Mucosa; Nasopharyngeal  Result Value Ref Range Status   MRSA by PCR NEGATIVE NEGATIVE Final    Comment:        The GeneXpert MRSA Assay (FDA approved for NASAL specimens only), is one component of a comprehensive MRSA colonization surveillance program. It is not intended to diagnose MRSA infection nor to guide or monitor treatment for MRSA  infections. Performed at [facility], [place] [address] [place], [place] [address]          Radiology Studies: No results found.      Scheduled Meds: . apixaban  2.5 mg Oral BID  . carbidopa-levodopa  1 tablet Oral QHS  . carbidopa-levodopa  2 tablet Oral QID  . donepezil  10 mg Oral QHS  . iron polysaccharides  150 mg Oral Daily  . latanoprost  1 drop Both Eyes QHS  . losartan  12.5 mg Oral Daily  . mouth rinse  15 mL Mouth Rinse BID  . Melatonin  3 mg Oral QHS  . multivitamin with minerals  1 tablet  Oral Daily  . PARoxetine  5 mg Oral QHS  . polyethylene glycol  17 g Oral Daily  . saccharomyces boulardii  250 mg Oral Q1400  . tamsulosin  0.4 mg Oral Daily   Continuous Infusions:   LOS: 3 days   The patient is critically ill with multiple organ systems failure and requires high complexity decision making for assessment and support, frequent evaluation and titration of therapies, application of advanced monitoring technologies and extensive interpretation of multiple databases. Critical Care Time devoted to patient care services described in this note  Time spent: 40 minutes     WOODS, Geraldo Docker, MD [facility] Pager [phone]  If 7PM-7AM, please contact night-coverage [email or web address] Password [facility] 10/25/2018, 7:14 AM

## 2018-10-24 NOTE — TOC Progression Note (Addendum)
Transition of Care [facility]) - Progression Note    Patient Details  Name: Henry Alvarez MRN: [id number] Date of Birth: [birth year]  [facility] CM/SW Contact  Henry Mayo, RN Phone Number: 10/24/2018, 9:22 AM  Clinical Narrative:    Patient from home with son and daughter, he has CNA's with [place] agency Tu-Fri from 72 to 3 and on Sat 12 to 4, and occasionally on Sundays, they help patient with preparing meals, washing clothes etc.  Per pt/ot eval patient will need HHPT/HHOT, poss HHST,  NCM offered choice to son Henry Alvarez, he chose [facility], referral made to [place], she states she can take referral.  Soc will begin 24 to 48 hrs post dc.  NCM received consult for Palliative Services, son ,Henry Alvarez states she is not sure they want this he would like to speak with MD before he makes a decision regarding palliative services.  Also patient will need 3 n 1, referral made to [place] will bring to room,  Son states that he could transport patient home today if he is dc today, but if dc tomorrow would need ambulance. NCM notified MD need [place] orders.   9/18- Palliative met with family yesterday and they decided on home hospice.  NCM received call from son , Henry Alvarez today , he states they want home hospice with hh services, NCM informed him that he could not do hh services with hospice, but he could do hh services with palliative.  He states he will call [facility] and call me back because now this changes things.  NCM received call back from son, he states he needs to speak with his sister about this because if he wants patient to get hh services than he will have to pay out of pocket.  He states he would like [facility] home hospice but not to do anything yet until he talks with his sister.  NCM notified by [facility] with Palliative that she spoke with Henry Alvarez and he decided on home with hospice with [facility].  Referral given to [facility]. Patient will need ambulance transport home  at discharge. Forms are on chart, MD to sign DNR form.    Expected Discharge Plan: [place] Barriers to Discharge: No Barriers Identified  Expected Discharge Plan and Services Expected Discharge Plan: [place] In-house Referral: Hospice / Palliative Care Discharge Planning Services: CM Consult Post Acute Care Choice: [place] arrangements for the past 2 months: Single Family Home Expected Discharge Date: 10/24/18               DME Arranged: 3-N-1 DME Agency: [employer] Date DME Agency Contacted: 10/24/18 Time DME Agency Contacted: [phone] Representative spoke with at DME Agency: zack HH Arranged: PT, OT, Speech Therapy [place] Agency: [facility] [employer] Date [place]: 10/24/18 Time [place]: 0920 Representative spoke with at [place]: [place] ([place]) Interventions    Readmission Risk Interventions No flowsheet data found.

## 2018-10-24 NOTE — Progress Notes (Signed)
Dr. Radford Pax recommends 2 week f/u appt virtually but not clear where pt will be discharged to yet - if home, family may be able to assist with video visit but if SNF will need to coordinate with nursing availability. Therefore will send message to our office to call patient/family after discharge to determine destination and help arrange virtual visit. Korinne Greenstein PA-C

## 2018-10-24 NOTE — Progress Notes (Signed)
Progress Note  Patient Name: Henry Alvarez Date of Encounter: 10/24/2018  Primary Cardiologist: No primary care provider on file.   Subjective   Sitting up in chair this am.  Somewhat tear full.  Says he is tired.  HR in the low 100's.  Inpatient Medications    Scheduled Meds:  apixaban  2.5 mg Oral BID   carbidopa-levodopa  1 tablet Oral QHS   carbidopa-levodopa  2 tablet Oral QID   donepezil  10 mg Oral QHS   iron polysaccharides  150 mg Oral Daily   latanoprost  1 drop Both Eyes QHS   losartan  12.5 mg Oral Daily   mouth rinse  15 mL Mouth Rinse BID   Melatonin  3 mg Oral QHS   multivitamin with minerals  1 tablet Oral Daily   PARoxetine  5 mg Oral QHS   polyethylene glycol  17 g Oral Daily   saccharomyces boulardii  250 mg Oral Q1400   tamsulosin  0.4 mg Oral Daily   Continuous Infusions:  PRN Meds: acetaminophen **OR** acetaminophen, hydrALAZINE, levalbuterol, ondansetron **OR** ondansetron (ZOFRAN) IV, Resource ThickenUp Clear   Vital Signs    Vitals:   10/23/18 2325 10/24/18 0000 10/24/18 0410 10/24/18 0815  BP: (!) 132/105  (!) 137/108 117/90  Pulse:  99 94 78  Resp:  13 20 17   Temp:   97.8 F (36.6 C) 98.3 F (36.8 C)  TempSrc:   Axillary Oral  SpO2:  98% 90% 94%  Weight:   69.8 kg   Height:        Intake/Output Summary (Last 24 hours) at 10/24/2018 1054 Last data filed at 10/24/2018 0413 Gross per 24 hour  Intake --  Output 500 ml  Net -500 ml   Filed Weights   10/22/18 0529 10/23/18 0500 10/24/18 0410  Weight: 69.8 kg 69.4 kg 69.8 kg    Telemetry    NSR - Personally Reviewed  ECG    No new EKG to review - Personally Reviewed  Physical Exam   GEN: ill appearing HEENT: Normal NECK: No JVD; No carotid bruits LYMPHATICS: No lymphadenopathy CARDIAC:irregularly irregular, no murmurs, rubs, gallops RESPIRATORY:  Clear to auscultation without rales, wheezing or rhonchi  ABDOMEN: Soft, non-tender,  non-distended MUSCULOSKELETAL:  No edema; No deformity  SKIN: Warm and dry NEUROLOGIC:  Alert and oriented x 3 PSYCHIATRIC:  Normal affect    Labs    Chemistry Recent Labs  Lab 10/21/18 1700 10/22/18 0753 10/23/18 0232 10/24/18 0246  NA 139 141 140 136  K 4.1 3.6 3.6 3.7  CL 105 103 102 103  CO2 25 24 29 23   GLUCOSE 107* 95 105* 89  BUN 24* 21 29* 25*  CREATININE 1.33* 1.36* 1.53* 1.25*  CALCIUM 9.1 9.3 9.0 8.8*  PROT 6.3* 6.6  --   --   ALBUMIN 3.5 3.8  --   --   AST 36 34  --   --   ALT 5 8  --   --   ALKPHOS 56 62  --   --   BILITOT 1.4* 1.8*  --   --   GFRNONAA 46* 45* 39* 50*  GFRAA 53* 52* 45* 58*  ANIONGAP 9 14 9 10      Hematology Recent Labs  Lab 10/22/18 0753 10/23/18 0232 10/24/18 0246  WBC 7.1 6.5 6.0  RBC 4.40 4.20* 4.26  HGB 14.0 12.8* 13.4  HCT 41.8 40.3 40.3  MCV 95.0 96.0 94.6  MCH 31.8 30.5 31.5  MCHC 33.5 31.8 33.3  RDW 15.9* 15.7* 15.6*  PLT 94* 106* 101*    Cardiac EnzymesNo results for input(s): TROPONINI in the last 168 hours. No results for input(s): TROPIPOC in the last 168 hours.   BNP Recent Labs  Lab 10/21/18 1700  BNP 1,289.8*     DDimer  Recent Labs  Lab 10/21/18 1700  DDIMER 0.73*     Radiology    Nm Pulmonary Perf And Vent  Result Date: 10/22/2018 CLINICAL DATA:  Syncope, shortness of breath EXAM: NUCLEAR MEDICINE PERFUSION LUNG SCAN TECHNIQUE: Perfusion images were obtained in multiple projections after intravenous injection of Tc-28m MAA. RADIOPHARMACEUTICALS:  1.5 mCi Tc93m MAA IV COMPARISON:  Chest radiograph 10/21/2018 FINDINGS: Perfusion: No wedge shaped peripheral perfusion defects to suggest acute pulmonary embolism. Expected photopenia along posterior portions of the chest related to the patient's lower arm position. Expected photopenia related to large cardiac contour. Ventilation images were not performed. IMPRESSION: 1. Normal perfusion lung scan with no specific defects to suggest pulmonary embolus.  Ventilation images were not performed. Electronically Signed   By: Van Clines M.D.   On: 10/22/2018 12:09   Dg Chest Port 1 View  Result Date: 10/22/2018 CLINICAL DATA:  Shortness of breath. EXAM: PORTABLE CHEST 1 VIEW COMPARISON:  Lung scan 10/22/2018. Chest x-ray 10/21/2018, 01/18/2017. FINDINGS: Cardiomegaly with normal pulmonary vascularity. No focal infiltrate. Chronic bilateral interstitial prominence noted. No pleural effusion or pneumothorax. IMPRESSION: 1.  Stable cardiomegaly with normal pulmonary vascularity. 2. Chronic bilateral interstitial lung disease. No acute focal infiltrate. Electronically Signed   By: Marcello Moores  Register   On: 10/22/2018 12:27    Cardiac Studies   2D echo 10/22/2018 IMPRESSIONS   1. The left ventricle has severely reduced systolic function, with an ejection fraction of 20-25%. The cavity size was normal. There is moderately increased left ventricular wall thickness. Left ventricular diastolic function could not be evaluated  secondary to atrial fibrillation. Left ventricular diffuse hypokinesis.  2. Left atrial size was severely dilated.  3. The mitral valve is abnormal. Mild thickening of the mitral valve leaflet. Mitral valve regurgitation is mild to moderate by color flow Doppler.  4. The tricuspid valve is grossly normal. Tricuspid valve regurgitation is mild-moderate.  5. The aortic valve is tricuspid. Moderate calcification of the aortic valve. Aortic valve regurgitation is mild by color flow Doppler. Moderate stenosis of the aortic valve.  6. The aorta is normal unless otherwise noted.  7. The inferior vena cava was dilated in size with >50% respiratory variability.  Patient Profile     85 y.o. male with a hx of tachybrady, chronic afib on Eliquis, ASCAD S/P PCI of LAD 2009, HTN, HLD and Parkinsons.  Admitted after near syncopal episode.  Apparently has had several episodes of sudden dizziness, tremors and Dyspnea over the past weeks.  In ER  EKg with afib with CVR, BNP 1289, HSTrop 38>37.  Given IV Lasix, elevated ddimer with VQ pending.  He has a hx of mild AS and normal LVF by echo a year ago. He was orthostatic in the ER.   Assessment & Plan    1.  Paroxysmal atrial fibrillation with RVR : -Has a long history of tachybradycardia syndrome and paroxysmal atrial fibrillation for which he has been well maintained on anticoagulation with low-dose Eliquis.   -Not on AV nodal blocking agents secondary to history of bradycardia in the postconversion setting dating back to 2015.  On ED arrival, BNP noted to be elevated likely in the setting  of atrial fibrillation with RVR with rates in the 90s. -HR much better controlled this am -TSH within normal limits -2D echo with severe LV dysfunction ? Tachy mediated  -continue Eliquis 2.5mg  BID -given advanced age, progressive dementia and parkinson's I do not feel he is a candidate for PPM for tachybrady and would avoid BB due to severe post termination pauses on BB in the past. His HR is fairly well controlled.  2.  Acute on chronic combined systolic/diastolic CHF: -Last echocardiogram from 2019 with normal LV function with severe focal thickening and calcification involving the right coronary cusp, mild aortic stenosis and moderate regurgitation with a mean gradient of 8 mmHg -Likely acute volume overload in the setting of atrial fibrillation with RVR and new LV dysfunction -2D echo with severe LV dysfunction ? Tachy mediated -Does not appear to be overtly fluid volume overloaded on exam today -given orthostatic BP on admit would avoid hydralazine/nitrates for preload/afterload reduction -he has mild CKD but improved today with Creatinine at 1.25.  -will try low dose Losartan 12.5mg  daily -no BB due to hx of severe bradycardia with severe post conversion pauses in 2015 -given advanced age with dementia and Parkinsons with significant debilitation, would not recommend further ischemic workup  and not a candidate for advanced therapies due to advanced age.  3. Near syncopal episode/Moderate AS: -Unclear etiology. Per chart review and RN report, patient was at home and was getting up from his chair to go outside when he experienced a near syncopal event. During the episode he was noted to have had tremors and some dyspnea. Son reports he has had multiple episodes in the last several weeks.  I spoke with son and he gave me a hx where in patient was walking to the car and sat down and could not move feet to get in them in the car.  He was staring ahead and would not converse with his son.   -No arrhthymias on monitor except afib -orthostatic in ER -? Arrhythmia given new LV dysfunction -echo showed mild  AS by mean gradient and peak velocity but may be underestimated in setting of severe LV dysfunction.  His dimensionless index and AVA are more consistent with moderate AS. I personally reviewed the images and there is thickening and calcification of the AV with immobility of the RCC and LCC and visually appears to be moderate AS.  -given advanced age and dementia with debilitation due to Parkinsons he would not be a candidate for TAVR. -suspect that episode was more related to his Parkinsons dz -recommend thigh high compression hose  4.  History of CAD: -Remote history of PCI to LAD in 2009 with no recurrent ischemic cardiac issues since that time -EKG with atrial fibrillation, HR 91 bpm with no ischemic changes -Denies anginal pain -No ASA in the setting of low-dose Eliquis -Not on home statin -No beta-blocker in the setting of history of bradycardia in the postconversion setting  5.  HLD: -Last LDL, 108 on 09/06/2012  6.  History of /tachycardia/bradycardia syndrome: -Was previously evaluated for bradycardia in the post conversion setting in 2015 with heart rates in the 30 to 40 bpm range.  EP evaluated at that time and initially thought to need PPM implantation however this was  eventually deferred given stabilization. -AV nodal blocking agents have been avoided since that time -Electrolyes WNL    7. Dementia with Parkinson's Disease: -Had initial confusion prior to ED arrival in the setting of near syncopal episode.  -Has  baseline dementia>>on Aricept, Sinemet  Given debilitated state, dementia and Parkinson and difficulty taking care of himself, would consider Palliative Care consult.  I had a very long discussion about patient's prognosis with his son today.  He understands that patient has severe LV dysfunction and at least moderate AS.  He has not had any chest pain or SOB and with advanced age and co morbidities, cardiac cath no recommended.  He is very debilitated from his Parkinsons with significant dementia and appears to be on a downward spiral in regards to functional state.  I have recommended Palliative care consult with his son.  I have spoken with Dr. Sherral Hammers with [facility] who will talk with son and arrange meeting.  Nothing more from cardiac standpoint to offer.    [facility] will sign off.   Medication Recommendations:  Losartan 12.5mg  daily.  Eliquis unless TRH feels risks of bleed due to increased risk of fall outweigh benefits. Recommend thigh high compression hose.  Other recommendations (labs, testing, etc):  none Follow up as an outpatient:  Dr. Martinique   For questions or updates, please contact [place] Please consult [email or web address] for contact info under Cardiology/STEMI.      Signed, Fransico Him, MD  10/24/2018, 10:54 AM

## 2018-10-25 DIAGNOSIS — R627 Adult failure to thrive: Secondary | ICD-10-CM | POA: Diagnosis present

## 2018-10-25 DIAGNOSIS — G20 Parkinson's disease: Secondary | ICD-10-CM

## 2018-10-25 DIAGNOSIS — G20A1 Parkinson's disease without dyskinesia, without mention of fluctuations: Secondary | ICD-10-CM

## 2018-10-25 DIAGNOSIS — N179 Acute kidney failure, unspecified: Secondary | ICD-10-CM | POA: Diagnosis present

## 2018-10-25 DIAGNOSIS — I5043 Acute on chronic combined systolic (congestive) and diastolic (congestive) heart failure: Secondary | ICD-10-CM | POA: Diagnosis present

## 2018-10-25 DIAGNOSIS — I10 Essential (primary) hypertension: Secondary | ICD-10-CM | POA: Diagnosis present

## 2018-10-25 LAB — BASIC METABOLIC PANEL
Anion gap: 9 (ref 5–15)
BUN: 25 mg/dL — ABNORMAL HIGH (ref 8–23)
CO2: 25 mmol/L (ref 22–32)
Calcium: 8.9 mg/dL (ref 8.9–10.3)
Chloride: 103 mmol/L (ref 98–111)
Creatinine, Ser: 1.37 mg/dL — ABNORMAL HIGH (ref 0.61–1.24)
GFR calc Af Amer: 52 mL/min — ABNORMAL LOW (ref >60)
GFR calc non Af Amer: 44 mL/min — ABNORMAL LOW (ref >60)
Glucose, Bld: 94 mg/dL (ref 70–99)
Potassium: 4.1 mmol/L (ref 3.5–5.1)
Sodium: 137 mmol/L (ref 135–145)

## 2018-10-25 LAB — CBC
HCT: 41.2 % (ref 39.0–52.0)
Hemoglobin: 13.6 g/dL (ref 13.0–17.0)
MCH: 31.3 pg (ref 26.0–34.0)
MCHC: 33 g/dL (ref 30.0–36.0)
MCV: 94.9 fL (ref 80.0–100.0)
Platelets: 123 10*3/uL — ABNORMAL LOW (ref 150–400)
RBC: 4.34 MIL/uL (ref 4.22–5.81)
RDW: 15.7 % — ABNORMAL HIGH (ref 11.5–15.5)
WBC: 6.5 10*3/uL (ref 4.0–10.5)
nRBC: 0 % (ref 0.0–0.2)

## 2018-10-25 LAB — MAGNESIUM: Magnesium: 1.8 mg/dL (ref 1.7–2.4)

## 2018-10-25 MED ORDER — MAGNESIUM SULFATE 2 GM/50ML IV SOLN
2.0000 g | Freq: Once | INTRAVENOUS | Status: DC
  Administered 2018-10-25: 14:00:00 2 g via INTRAVENOUS
  Filled 2018-10-25: qty 50

## 2018-10-25 MED ORDER — MAGNESIUM OXIDE 400 (241.3 MG) MG PO TABS
400.0000 mg | ORAL_TABLET | Freq: Once | ORAL | Status: AC
  Administered 2018-10-25: 400 mg via ORAL
  Filled 2018-10-25: qty 1

## 2018-10-25 NOTE — Progress Notes (Signed)
VIsited patient at bedside.  Spoke with SLP she recommends D2 with nectar thick.  Patient appears very fatigued.  Indeed he falls asleep after speaking with me for a few moments.  He reports he is comfortable.  I asked him about having Occupational Therapy when he goes home.  He said "I don't like Occupational Therapy, but I'll do what I need to do" in between slightly labored breaths.    Mr. Gaschler appears very weak.  Too weak to speak more than a sentence or two.    Family is trying to determine whether to take him home with home health (PT/OT) vs hospice.  I feel their gains from PT/OT would be very small and they would receive a much greater overall benefit from Hospice.  They do have excellent in home support with [facility] care.   Recommend:  DC to home with hospice services if family is in agreement. I'm concerned if he stays in the hospital long he will lose quality time with his family as I anticipate he will decline quickly.  Florentina Jenny, PA-C Palliative Medicine Pager: [phone]   15 min.

## 2018-10-25 NOTE — Progress Notes (Signed)
Occupational Therapy Treatment Patient Details Name: Henry Alvarez MRN: [id number] DOB: 21-Apr-1924 Today's Date: 10/25/2018    History of present illness Patient is a 85 y/o male who was admitted due to syncopal episode. BNP elevated at 1289 consistent with acute diastolic CHF. Workup pending. PMH includes PD, Depression, dementia, CAD, Pafib, HTN, TIA.   OT comments  Pt with noticeable change in alertness, cognition, physical performance. Pt required mod A for transfer to [facility] (found with condom cath off, saturated in urine). Pt required max A for bathing, mod A for dressing, and mod A +2 for assist to recliner. Then requiring mod A for feeding. Pt confused, agitated throughout session. After reading Palliative note, agree that quality time with family and home with hospice services would be MORE beneficial from [facility] - but will support whatever the family decides, as there are skills that [facility] can work with the patient on.    Follow Up Recommendations  Home health OT;Supervision/Assistance - 24 hour(or Hospice - will defer to family)    Equipment Recommendations  3 in 1 bedside commode    Recommendations for Other Services Other (comment)(Palliative Medicine)    Precautions / Restrictions Precautions Precautions: Fall Precaution Comments: watch HR and 02 Restrictions Weight Bearing Restrictions: No       Mobility Bed Mobility Overal bed mobility: Needs Assistance Bed Mobility: Supine to Sit     Supine to sit: +2 for safety/equipment;Max assist     General bed mobility comments: Pt required assistance to move to edge of bed and to elevate trunk into sitting edge of bed.  Increased assistance to scoot forward.  Transfers Overall transfer level: Needs assistance Equipment used: 2 person hand held assist;Rolling walker (2 wheeled) Transfers: Sit to/from [facility] Sit to Stand: Mod assist;+2 safety/equipment Stand pivot transfers: Mod assist;+2 physical  assistance       General transfer comment: Increased assistance with hand over hand guidance,  Pt wanting to move to bed but no sheets on bed required mod +2 to move from bed to recliner chair.    Balance Overall balance assessment: Needs assistance Sitting-balance support: Feet supported;No upper extremity supported Sitting balance-Leahy Scale: Fair     Standing balance support: During functional activity Standing balance-Leahy Scale: Poor Standing balance comment: reliant on B UE support and external assistance during dynamic gt activities.                           ADL either performed or assessed with clinical judgement   ADL Overall ADL's : Needs assistance/impaired Eating/Feeding: Moderate assistance;Sitting Eating/Feeding Details (indicate cue type and reason): in chair NT assisting after session Grooming: Wash/dry hands;Wash/dry face;Moderate assistance;Sitting Grooming Details (indicate cue type and reason): Pt required mod A for task completion Upper Body Bathing: Maximal assistance;Sitting Upper Body Bathing Details (indicate cue type and reason): while on BSC, Pt able to lift up arms for access to arm pits Lower Body Bathing: Maximal assistance;Sit to/from stand;+2 for physical assistance;+2 for safety/equipment Lower Body Bathing Details (indicate cue type and reason): Pt unable to wash legs and peri area Upper Body Dressing : Moderate assistance;Sitting Upper Body Dressing Details (indicate cue type and reason): to don new gown     Toilet Transfer: Moderate assistance;Stand-pivot;BSC   Toileting- Clothing Manipulation and Hygiene: Maximal assistance       Functional mobility during ADLs: Moderate assistance;+2 for physical assistance;+2 for safety/equipment(HHA) General ADL Comments: decreased activity tolerance, balance  Vision       Perception     Praxis      Cognition Arousal/Alertness: Lethargic Behavior During Therapy:  Agitated Overall Cognitive Status: History of cognitive impairments - at baseline                                 General Comments: Pt continues with mild agitation and cognitive deficits.  He does not seem him self today.        Exercises     Shoulder Instructions       General Comments      Pertinent Vitals/ Pain       Pain Assessment: Faces Faces Pain Scale: Hurts little more Pain Location: generalized Pain Descriptors / Indicators: Grimacing Pain Intervention(s): Monitored during session;Limited activity within patient's tolerance;Repositioned  Home Living                                          Prior Functioning/Environment              Frequency  Min 2X/week        Progress Toward Goals  OT Goals(current goals can now be found in the care plan section)  Progress towards OT goals: Not progressing toward goals - comment(decreased cognition, overall performance)  Acute Rehab OT Goals Patient Stated Goal: to get out of here OT Goal Formulation: With patient/family Time For Goal Achievement: 11/06/18 Potential to Achieve Goals: [place] Discharge plan remains appropriate    Co-evaluation    PT/OT/SLP Co-Evaluation/Treatment: Yes Reason for Co-Treatment: Complexity of the patient's impairments (multi-system involvement);For patient/therapist safety;To address functional/ADL transfers;Necessary to address cognition/behavior during functional activity PT goals addressed during session: Mobility/safety with mobility;Balance;Proper use of DME OT goals addressed during session: ADL's and self-care;Proper use of Adaptive equipment and DME      AM-PAC OT "6 Clicks" Daily Activity     Outcome Measure   Help from another person eating meals?: A Lot Help from another person taking care of personal grooming?: A Lot Help from another person toileting, which includes using toliet, bedpan, or urinal?: A Lot Help from another  person bathing (including washing, rinsing, drying)?: A Lot Help from another person to put on and taking off regular upper body clothing?: A Lot Help from another person to put on and taking off regular lower body clothing?: A Lot 6 Click Score: 12    End of Session Equipment Utilized During Treatment: Gait belt;Rolling walker  OT Visit Diagnosis: Other abnormalities of gait and mobility (R26.89);Muscle weakness (generalized) (M62.81);History of falling (Z91.81);Other symptoms and signs involving cognitive function   Activity Tolerance Patient limited by lethargy   Patient Left in chair;with call bell/phone within reach;with nursing/sitter in room   Nurse Communication Mobility status        Time: 1401-1430 OT Time Calculation (min): 29 min  Charges: OT General Charges $OT Visit: 1 Visit OT Treatments $Self Care/Home Management : 8-22 mins  Hulda Humphrey OTR/L Acute Rehabilitation Services Pager: [phone] Office: [place] 10/25/2018, 5:46 PM

## 2018-10-25 NOTE — Progress Notes (Signed)
PROGRESS NOTE    Henry Alvarez  [id number] DOB: Feb 09, 1924 DOA: 10/21/2018 PCP: Reynold Bowen, MD   Brief Narrative:  85 y.o. WM PMHx Parkinson's disease,  Dementia, atrial fibrillation, CAD, iron deficiency anemia, chronic thrombocytopenia   Brought to the ER after patient had a brief syncopal episode during which patient also had some tremors and was experiencing shortness of breath.  As per patient son patient does have shortness of breath chronically from his weakness from Parkinson's disease.  Has not had any chest pain fever chills nausea vomiting or diarrhea.  Has not had any productive cough.  Today around 2 PM when patient was trying to go out in the cough patient suddenly felt weak and passed out for a minute or 2 following which patient appeared confused but did not have any incontinence of urine or bowel.  The upper part of the extremities had tremors.  Patient was brought to the ER.  As per patient's son only new medication started was Flexeril by patient's neurologist last week for sleeping.  ED Course: In the ER patient was appearing at back at baseline.  Mildly hypoxic.  Chest x-ray unremarkable.  COVID-19 test was negative.  EKG showed A. fib with rate around 90 bpm.  Patient's labs show BNP of 1289.  Patient was given 1 dose of Lasix 40 mg IV following which patient had good diuresis.  At the time of my exam patient is not in distress and patient admitted for further observation.  Other labs show hemoglobin of 12.8 platelets 95 creatinine 1.3.  D-dimer was mildly elevated 0.73 CT head unremarkable.   Subjective: 9/18 sleeping but arousable.    Assessment & Plan:   Principal Problem:   Acute CHF (congestive heart failure) (HCC) Active Problems:   Paroxysmal atrial fibrillation (HCC)   Long term current use of anticoagulant therapy   Parkinson's disease (HCC)   BPH (benign prostatic hyperplasia)   Syncope   Acute diastolic CHF (congestive heart failure) ([place])  Palliative care by specialist   Dementia due to Parkinson's disease without behavioral disturbance ([place])   DNR (do not resuscitate)   Palliative care encounter   Acute on chronic combined systolic and diastolic CHF (congestive heart failure) ([place])   Essential hypertension   Parkinsons disease ([place])   Acute renal failure superimposed on stage 3 chronic kidney disease ([place])   Adult failure to thrive  Acute respiratory failure with hypoxia - Multifactorial CHF?/A. fib with RVR -Treat underlying causes -VQ scan negative -Resolved  Tachycardia/bradycardia syndrome - Patient evaluated by cardiology - Previously evaluated for bradycardia in the postconversion setting in 2015 with heart rates in the 30s to 40 bpm range. - EP evaluated at that time initially thought to need PPM implantation however this was deferred given stabilization - AV nodal blocking agents have been avoided since that time - Discussed case with Dr. Radford Pax cardiology feel there is no further reasonable cardiac intervention -9/18 currently A. 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 fib with RVR which has resolved. - TTE October 2019; LVEF 50 to 55%, unable to evaluate diastolic function due to A. fib - 9/15 echocardiogram  EF 20 to 25%, - Euvolemic on exam -Strict in and out -585 ml -Daily weight Filed Weights   10/23/18 0500 10/24/18 0410 10/25/18 0528  Weight: 69.4 kg 69.8 kg 70 kg   Paroxysmal atrial fibrillation with RVR -See tachycardia/bradycardia syndrome -A. fib with RVR was noted up to 140s  to 150s, now Rate controlled -Continue apixaban  CAD s/p PCI - Cannot use nodal blocking agents secondary to patient's tachycardia/bradycardia syndrome - Cannot use aspirin secondary to patient being on Eliquis - Not on statin, will discuss with children prior to discharge risk and benefits of starting now given his advanced age.  Essential HTN -Currently  controlled would not add any additional agents given patient's Parkinson's disease and age would increase risk of fall  Syncope/near syncopal event - Most likely multifactorial, tachycardia/bradycardia syndrome, advanced Parkinson's disease - EEG negative for seizure activity, see results below - PT eval pending  Parkinson's disease -Followed by Dr. Metta Clines, [facility].  Last seen on 9/4  - Per Dr. Tomi Likens note at time of exam on 9/4 patient was positive weaker, with legs sometimes giving out. - Dysphagia present - Dr. Tomi Likens also added cyclobenzaprine 5 mg QHS (discussed patient may become more confused) - They were to follow-up in 4 months. Continue Sinemet at home dose.Due to concern for worsening tremor/mental status changes, holding Flexeril and Ultracet.   Dysphasia  -Most likely secondary to advancing Parkinson's disease - 9/18 swallow study dysphagia 2 nectar thick liquid   Dementia/depression -On Aricept however do not believe is doing patient any further good considering he has been on it 3 to 4 years per his children.  Since patient will be going home on hospice will most likely discontinue. -Continue Paxil 5 mg daily  Acute on CKD stage III (baseline Cr 1.3) Recent Labs  Lab 10/21/18 1700 10/22/18 0753 10/23/18 0232 10/24/18 0246 10/25/18 0135  CREATININE 1.33* 1.36* 1.53* 1.25* 1.37*  - Creatinine back to baseline  BPH - At family meeting recommended that we continue tamsulosin for comfort  Chronic thrombocytopenia - Stable  Insomnia -Discontinued all sedating medication (Flexeril, tramadol) secondary to perceived altered mental status. -Continue melatonin I  Adult failure to thrive - Multifactorial; advanced Parkinson's, dementia, worsening CHF - 9/17 family meeting decided to proceed with home with hospice.  Hypomagnesmia -Magnesium goal> 2 - Magnesium 2 g    Goals of care - 9/17 discussed case at length with Dr. Fransico Him  cardiology who feels given patient's other multiple medical problems (especially Parkinson's), no further appropriate intervention for cardiology at this time.  She discussed patient's prognosis at length with son - 9/17 palliative care consult;GOC.  Family would like a face-to-face meeting with palliative care to discuss options for taking father home vs inpatient hospice     DVT prophylaxis: Eliquis Code Status: DNR Family Communication: 9/17 family meeting with son and daughter and Florentina Jenny (palliative care) after lengthy discussion of treatment options family has decided to take patient home on hospice. Disposition Plan: 9/18 plan is for home with hospice   Consultants:  Palliative care Cardiology     Procedures/Significant Events:  9/15 echocardiogram; LVEF 20-25%. -Left ventricular diastolic function could not be evaluated secondary to atrial fibrillation. Left ventricular diffuse hypokinesis. -Left atrial; severely dilated. - The mitral valve; mild to moderate. -Tricuspid valve regurgitation is mild-moderate. Aortic valve Moderate stenosis of the aortic valve. 9/16 EEG;-cortical dysfunction in left temporal region, non specific to etiology.  -No seizures or epileptiform discharges    I have personally reviewed and interpreted all radiology studies and my findings are as above.  VENTILATOR SETTINGS:    Cultures   Antimicrobials: Anti-infectives (From admission, onward)   None       Devices    LINES / TUBES:      Continuous Infusions:  Objective: Vitals:   10/24/18 1933 10/25/18 0041 10/25/18 0306 10/25/18 0528  BP: (!) 117/92 (!) 128/98 (!) 137/91   Pulse: 94 96 86   Resp: (!) 24 (!) 26 14   Temp: (!) 97.4 F (36.3 C) 98.2 F (36.8 C) 98.3 F (36.8 C)   TempSrc: Oral Oral Oral   SpO2: 97% 97% 94%   Weight:    70 kg  Height:        Intake/Output Summary (Last 24 hours) at 10/25/2018 0716 Last data filed at 10/24/2018 2100 Gross  per 24 hour  Intake 360 ml  Output 475 ml  Net -115 ml   Filed Weights   10/23/18 0500 10/24/18 0410 10/25/18 0528  Weight: 69.4 kg 69.8 kg 70 kg   Physical Exam:  General: Sleepy but arousable, no acute respiratory distress Eyes: negative scleral hemorrhage, negative anisocoria, negative icterus ENT: Negative Runny nose, negative gingival bleeding, Neck:  Negative scars, masses, torticollis, lymphadenopathy, JVD Lungs: Clear to auscultation bilaterally without wheezes or crackles Cardiovascular: Irregular irregular rhythm and rate, without murmur gallop or rub normal S1 and S2 Abdomen: negative abdominal pain, nondistended, positive soft, bowel sounds, no rebound, no ascites, no appreciable mass Extremities: No significant cyanosis, clubbing, or edema bilateral lower extremities Skin: Negative rashes, lesions, ulcers Psychiatric:  Negative depression, negative anxiety, positive fatigue, negative mania  Central nervous system:  Cranial nerves II through XII intact, tongue/uvula midline, all extremities muscle strength 3-4/5, sensation intact throughout, negative dysarthria, negative expressive aphasia, negative receptive aphasia.    Data Reviewed: Care during the described time interval was provided by me .  I have reviewed this patient's available data, including medical history, events of note, physical examination, and all test results as part of my evaluation.   CBC: Recent Labs  Lab 10/21/18 1700 10/22/18 0753 10/23/18 0232 10/24/18 0246 10/25/18 0135  WBC 5.3 7.1 6.5 6.0 6.5  NEUTROABS 4.2  --   --   --   --   HGB 12.8* 14.0 12.8* 13.4 13.6  HCT 39.1 41.8 40.3 40.3 41.2  MCV 97.3 95.0 96.0 94.6 94.9  PLT 95* 94* 106* 101* [id number]   Basic Metabolic Panel: Recent Labs  Lab 10/21/18 1700 10/22/18 0753 10/23/18 0232 10/24/18 0246 10/25/18 0135  NA 139 141 140 136 137  K 4.1 3.6 3.6 3.7 4.1  CL 105 103 102 103 103  CO2 25 24 29 23 25   GLUCOSE 107* 95 105* 89 94   BUN 24* 21 29* 25* 25*  CREATININE 1.33* 1.36* 1.53* 1.25* 1.37*  CALCIUM 9.1 9.3 9.0 8.8* 8.9  MG  --  1.8  --   --  1.8   GFR: Estimated Creatinine Clearance: 29.9 mL/min (A) (by C-G formula based on SCr of 1.37 mg/dL (H)). Liver Function Tests: Recent Labs  Lab 10/21/18 1700 10/22/18 0753  AST 36 34  ALT 5 8  ALKPHOS 56 62  BILITOT 1.4* 1.8*  PROT 6.3* 6.6  ALBUMIN 3.5 3.8   No results for input(s): LIPASE, AMYLASE in the last 168 hours. No results for input(s): AMMONIA in the last 168 hours. Coagulation Profile: No results for input(s): INR, PROTIME in the last 168 hours. Cardiac Enzymes: No results for input(s): CKTOTAL, CKMB, CKMBINDEX, TROPONINI in the last 168 hours. BNP (last 3 results) No results for input(s): PROBNP in the last 8760 hours. HbA1C: No results for input(s): HGBA1C in the last 72 hours. CBG: No results for input(s): GLUCAP in the last 168 hours. Lipid Profile:  No results for input(s): CHOL, HDL, LDLCALC, TRIG, CHOLHDL, LDLDIRECT in the last 72 hours. Thyroid Function Tests: Recent Labs    10/22/18 0753  TSH 3.236   Anemia Panel: No results for input(s): VITAMINB12, FOLATE, FERRITIN, TIBC, IRON, RETICCTPCT in the last 72 hours. Urine analysis:    Component Value Date/Time   COLORURINE AMBER (A) 06/21/2018 1939   APPEARANCEUR HAZY (A) 06/21/2018 1939   LABSPEC 1.024 06/21/2018 1939   PHURINE 5.0 06/21/2018 1939   GLUCOSEU NEGATIVE 06/21/2018 1939   HGBUR NEGATIVE 06/21/2018 1939   BILIRUBINUR NEGATIVE 06/21/2018 1939   BILIRUBINUR small (A) 01/18/2017 1709   KETONESUR 5 (A) 06/21/2018 1939   PROTEINUR 30 (A) 06/21/2018 1939   UROBILINOGEN 1.0 01/18/2017 1709   UROBILINOGEN 0.2 12/06/2014 2045   NITRITE NEGATIVE 06/21/2018 1939   LEUKOCYTESUR NEGATIVE 06/21/2018 1939   Sepsis Labs: @LABRCNTIP (procalcitonin:4,lacticidven:4)  ) Recent Results (from the past 240 hour(s))  SARS CORONAVIRUS 2 (TAT 6-24 HRS) Nasopharyngeal Nasopharyngeal  Swab     Status: None   Collection Time: 10/21/18  8:33 PM   Specimen: Nasopharyngeal Swab  Result Value Ref Range Status   SARS Coronavirus 2 NEGATIVE NEGATIVE Final    Comment: (NOTE) SARS-CoV-2 target nucleic acids are NOT DETECTED. The SARS-CoV-2 RNA is generally detectable in upper and lower respiratory specimens during the acute phase of infection. Negative results do not preclude SARS-CoV-2 infection, do not rule out co-infections with other pathogens, and should not be used as the sole basis for treatment or other patient management decisions. Negative results must be combined with clinical observations, patient history, and epidemiological information. The expected result is Negative. Fact Sheet for Patients: SugarRoll.be Fact Sheet for Healthcare Providers: https://www.-mathews.com/ This test is not yet approved or cleared by the [place] FDA and  has been authorized for detection and/or diagnosis of SARS-CoV-2 by FDA under an Emergency Use Authorization (EUA). This EUA will remain  in effect (meaning this test can be used) for the duration of the COVID-19 declaration under Section 56 4(b)(1) of the Act, 21 U.S.C. section 360bbb-3(b)(1), unless the authorization is terminated or revoked sooner. Performed at [facility], [place] [address] [place], [place] [address]   MRSA PCR Screening     Status: None   Collection Time: 10/22/18  5:39 AM   Specimen: Nasal Mucosa; Nasopharyngeal  Result Value Ref Range Status   MRSA by PCR NEGATIVE NEGATIVE Final    Comment:        The GeneXpert MRSA Assay (FDA approved for NASAL specimens only), is one component of a comprehensive MRSA colonization surveillance program. It is not intended to diagnose MRSA infection nor to guide or monitor treatment for MRSA infections. Performed at [facility], [place] [address], [place], [place] [address]          Radiology  Studies: No results found.      Scheduled Meds: . apixaban  2.5 mg Oral BID  . carbidopa-levodopa  1 tablet Oral QHS  . carbidopa-levodopa  2 tablet Oral QID  . donepezil  10 mg Oral QHS  . iron polysaccharides  150 mg Oral Daily  . latanoprost  1 drop Both Eyes QHS  . losartan  12.5 mg Oral Daily  . mouth rinse  15 mL Mouth Rinse BID  . Melatonin  3 mg Oral QHS  . multivitamin with minerals  1 tablet Oral Daily  . PARoxetine  5 mg Oral QHS  . polyethylene glycol  17 g Oral Daily  .  saccharomyces boulardii  250 mg Oral Q1400  . tamsulosin  0.4 mg Oral Daily   Continuous Infusions:   LOS: 3 days   The patient is critically ill with multiple organ systems failure and requires high complexity decision making for assessment and support, frequent evaluation and titration of therapies, application of advanced monitoring technologies and extensive interpretation of multiple databases. Critical Care Time devoted to patient care services described in this note  Time spent: 40 minutes     Calee Nugent, Geraldo Docker, MD [facility] Pager [phone]  If 7PM-7AM, please contact night-coverage [email or web address] Password [facility] 10/25/2018, 7:16 AM

## 2018-10-25 NOTE — Progress Notes (Signed)
Henry Alvarez is a 85 y.o. male patient transferred from [facility] to 6N31, alert - oriented  X 3-4 - no acute distress noted.  VSS - Blood pressure 115/83, pulse 79, temperature 97.7 F (36.5 C), temperature source Axillary, resp. rate 16, height 5\' 5"  (1.651 m), weight 70 kg, SpO2 96 %.    IV in place, occlusive dsg intact without redness.    Will cont to eval and treat per MD orders.  Vidal Schwalbe, RN 10/25/2018 11:34 PM

## 2018-10-25 NOTE — Progress Notes (Signed)
  Speech Language Pathology Treatment: Dysphagia  Patient Details Name: Henry Alvarez MRN: [id number] DOB: 12/31/1924 Today's Date: 10/25/2018 Time: [id number] SLP Time Calculation (min) (ACUTE ONLY): 17 min  Assessment / Plan / Recommendation Clinical Impression  Pt was encountered awake, lying sem-reclined in bed and he was agreeable to ST tx session.  RN reported that pt was tolerating his current diet without difficulty. Pt was observed with trials of preferred nectar-thick liquid, honey-thick liquid, and softened solids (graham cracker softened in applesauce).  Pt requested to consumed liquids via straw sip; however, he was unable to move the liquid to his oral cavity despite effort and verbal encouragement.  Pt independently drank liquids from a cup given set up and verbal cues.  Pt exhibited  intermittent wet vocal quality and delayed throat clear with nectar-thick liquid when head was in a neutral position, which was eliminated with cued chin tuck.  Honey-thick liquids were tried and pt was observed to have an audible swallow with suspected delayed swallow initiation and an immediate throat clear.  Pt exhibited prolonged mastication with soft solids, but no overt s/sx of aspiration were observed.  MBS may be indicated at this time; however, per most recent palliative care note on 10/24/18, family is planning to bring pt home with hospice care, in which case comfort feeding may be more appropriate based on pt/family wishes.  Recommend continuation of Dysphagia 2 solids and nectar-thick liquid with cued chin tuck and full supervision to aid with additional compensatory strategies.    HPI HPI: 85 y.o. male with history of Parkinson's disease, atrial fibrillation, CAD, iron deficiency anemia, dementia, chronic thrombocytopenia was brought to the ER after patient had a brief syncopal episode during which patient also had some tremors and was experiencing shortness of breath.  Hx MBS May 2016 which  revealed aspiration of thin /nectar-thick liquids.  Regular diet with nectar thick liquids and chin tuck was recommended at the time.      SLP Plan  Continue with current plan of care    Recommendations  Diet recommendations: Nectar-thick liquid;Dysphagia 2 (fine chop) Liquids provided via: Cup Medication Administration: Crushed with puree Supervision: Staff to assist with self feeding;Full supervision/cueing for compensatory strategies Compensations: Slow rate;Small sips/bites;Chin tuck Postural Changes and/or Swallow Maneuvers: Seated upright 90 degrees               Oral Care Recommendations: Oral care BID Follow up Recommendations: 24 hour supervision/assistance;Home health SLP SLP Visit Diagnosis: Dysphagia, unspecified (R13.10) Plan: Continue with current plan of care       Bretta Bang, M.S., [place] Office: [phone]   [place] 10/25/2018, 10:27 AM

## 2018-10-25 NOTE — Progress Notes (Signed)
I spoke with Henry Alvarez.  He and [name] have decided to accept [facility] services to support their father in his home.  They support discontinuing his aricept.  Florentina Jenny, PA-C Palliative Medicine Pager: [phone]  No charge note.

## 2018-10-25 NOTE — Care Management Important Message (Signed)
Important Message  Patient Details  Name: Henry Alvarez MRN: [id number] Date of Birth: 11/02/1924   Medicare Important Message Given:  Yes     Memory Argue 10/25/2018, 4:23 PM

## 2018-10-25 NOTE — Plan of Care (Signed)
Progressing

## 2018-10-25 NOTE — Progress Notes (Addendum)
[facility])   Referral received from [place] for hospice services at home once discharged.  Left messages for both daughter and son to return phone call.  No response yet.    [facility] will continue to follow.  Unsure of any DME needs.  Thank you, Venia Carbon RN, BSN, [facility]iaison (in [phone]  **[phone], son returned call.  Discussed hospice services and answered questions.  No DME needs at this time. Please send completed DNR home with pt.  Please arrange for any prescriptions needed for symptom management prior to hospice services beginning.  [facility]AR transportation home on 9/19.  Approved for hospice services per [facility] MD.

## 2018-10-25 NOTE — Progress Notes (Signed)
Physical Therapy Treatment Patient Details Name: Henry Alvarez MRN: [id number] DOB: May 23, 1924 Today's Date: 10/25/2018    History of Present Illness Patient is a 85 y/o male who was admitted due to syncopal episode. BNP elevated at 1289 consistent with acute diastolic CHF. Workup pending. PMH includes PD, Depression, dementia, CAD, Pafib, HTN, TIA.    PT Comments    Pt performed gt training and functional mobility short distance with in room.  On arrival pt soiled in urine as condom cath had removed itself.  Pt slow and guarded and does not appear him self.  Pt performed x2 transfers and require mod +2 throughout session.  Plan for return home with HHPT and support from family.     Follow Up Recommendations  Home health PT;Supervision for mobility/OOB;Supervision/Assistance - 24 hour     Equipment Recommendations  None recommended by PT    Recommendations for Other Services       Precautions / Restrictions Precautions Precautions: Fall Precaution Comments: watch HR and 02 Restrictions Weight Bearing Restrictions: No    Mobility  Bed Mobility Overal bed mobility: Needs Assistance Bed Mobility: Supine to Sit     Supine to sit: +2 for safety/equipment;Max assist     General bed mobility comments: Pt required assistance to move to edge of bed and to elevate trunk into sitting edge of bed.  Increased assistance to scoot forward.  Transfers Overall transfer level: Needs assistance Equipment used: Rolling walker (2 wheeled) Transfers: Sit to/from [facility] Sit to Stand: Mod assist;+2 safety/equipment Stand pivot transfers: Mod assist;+2 physical assistance       General transfer comment: Increased assistance with hand over hand guidance,  Pt wanting to move to bed but no sheets on bed required mod +2 to move from bed to recliner chair.  Ambulation/Gait Ambulation/Gait assistance: Mod assist;+2 physical assistance Gait Distance (Feet): 6  Feet Assistive device: Rolling walker (2 wheeled) Gait Pattern/deviations: Shuffle;Trunk flexed Gait velocity: decreased   General Gait Details: Pt performed slow shuffling steps from bed to recliner.  Series of side stepping involved and assistance to maneuver RW during session.  Max cues for direction to move from bed side commode to recliner.   Stairs             Wheelchair Mobility    Modified Rankin (Stroke Patients Only)       Balance Overall balance assessment: Needs assistance Sitting-balance support: Feet supported;No upper extremity supported Sitting balance-Leahy Scale: Fair       Standing balance-Leahy Scale: Poor                              Cognition Arousal/Alertness: Lethargic Behavior During Therapy: Agitated Overall Cognitive Status: History of cognitive impairments - at baseline                                 General Comments: Pt continues with mild agitation and cognitive deficits.  He does not seem him self today.      Exercises      General Comments        Pertinent Vitals/Pain Pain Assessment: Faces Faces Pain Scale: Hurts little more Pain Intervention(s): Monitored during session;Repositioned    Home Living                      Prior Function  PT Goals (current goals can now be found in the care plan section) Acute Rehab PT Goals Patient Stated Goal: to get out of here Potential to Achieve Goals: Fair Progress towards PT goals: Progressing toward goals    Frequency    Min 3X/week      PT Plan Current plan remains appropriate    Co-evaluation PT/OT/SLP Co-Evaluation/Treatment: Yes Reason for Co-Treatment: Complexity of the patient's impairments (multi-system involvement) PT goals addressed during session: Mobility/safety with mobility OT goals addressed during session: ADL's and self-care      AM-PAC PT "6 Clicks" Mobility   Outcome Measure  Help needed turning  from your back to your side while in a flat bed without using bedrails?: A Little Help needed moving from lying on your back to sitting on the side of a flat bed without using bedrails?: A Lot Help needed moving to and from a bed to a chair (including a wheelchair)?: A Little Help needed standing up from a chair using your arms (e.g., wheelchair or bedside chair)?: A Little Help needed to walk in hospital room?: A Little Help needed climbing 3-5 steps with a railing? : A Lot 6 Click Score: 16    End of Session Equipment Utilized During Treatment: Gait belt Activity Tolerance: Patient limited by fatigue Patient left: in chair;with call bell/phone within reach;with family/visitor present Nurse Communication: Mobility status;Other (comment) PT Visit Diagnosis: Unsteadiness on feet (R26.81);Muscle weakness (generalized) (M62.81);Difficulty in walking, not elsewhere classified (R26.2)     Time: 1401-1430 PT Time Calculation (min) (ACUTE ONLY): 29 min  Charges:  $Therapeutic Activity: 8-22 mins                     Governor Rooks, PTA Acute Rehabilitation Services Pager [phone] Office [phone]     Magdalene Tardiff Eli Hose 10/25/2018, 2:43 PM

## 2018-10-26 DIAGNOSIS — I495 Sick sinus syndrome: Secondary | ICD-10-CM

## 2018-10-26 LAB — CBC
HCT: 42.6 % (ref 39.0–52.0)
Hemoglobin: 13.4 g/dL (ref 13.0–17.0)
MCH: 30.5 pg (ref 26.0–34.0)
MCHC: 31.5 g/dL (ref 30.0–36.0)
MCV: 97 fL (ref 80.0–100.0)
Platelets: 108 10*3/uL — ABNORMAL LOW (ref 150–400)
RBC: 4.39 MIL/uL (ref 4.22–5.81)
RDW: 15.5 % (ref 11.5–15.5)
WBC: 6.2 10*3/uL (ref 4.0–10.5)
nRBC: 0 % (ref 0.0–0.2)

## 2018-10-26 LAB — BASIC METABOLIC PANEL
Anion gap: 12 (ref 5–15)
BUN: 24 mg/dL — ABNORMAL HIGH (ref 8–23)
CO2: 24 mmol/L (ref 22–32)
Calcium: 8.8 mg/dL — ABNORMAL LOW (ref 8.9–10.3)
Chloride: 102 mmol/L (ref 98–111)
Creatinine, Ser: 1.33 mg/dL — ABNORMAL HIGH (ref 0.61–1.24)
GFR calc Af Amer: 53 mL/min — ABNORMAL LOW (ref >60)
GFR calc non Af Amer: 46 mL/min — ABNORMAL LOW (ref >60)
Glucose, Bld: 96 mg/dL (ref 70–99)
Potassium: 3.7 mmol/L (ref 3.5–5.1)
Sodium: 138 mmol/L (ref 135–145)

## 2018-10-26 LAB — MAGNESIUM: Magnesium: 2 mg/dL (ref 1.7–2.4)

## 2018-10-26 MED ORDER — RESOURCE THICKENUP CLEAR PO POWD: ORAL | 0 refills | Status: AC

## 2018-10-26 MED ORDER — PAROXETINE HCL 10 MG PO TABS: 5.0000 mg | ORAL_TABLET | Freq: Every day | ORAL | 0 refills | Status: AC

## 2018-10-26 MED ORDER — ONDANSETRON HCL 4 MG PO TABS: 4.0000 mg | ORAL_TABLET | Freq: Four times a day (QID) | ORAL | 0 refills | Status: AC | PRN

## 2018-10-26 MED ORDER — LOSARTAN POTASSIUM 25 MG PO TABS: 12.5000 mg | ORAL_TABLET | Freq: Every day | ORAL | 0 refills | Status: AC

## 2018-10-26 NOTE — Discharge Summary (Signed)
Physician Discharge Summary  Henry Alvarez [id number] DOB: [birth year] DOA: 10/21/2018  PCP: Reynold Bowen, MD  Admit date: 10/21/2018 Discharge date: 10/26/2018  Time spent: 30 minutes  Recommendations for Outpatient Follow-up: Acute respiratory failure with hypoxia - Multifactorial CHF?/A. fib with RVR -Treat underlying causes -VQ scan negative -Resolved  Tachycardia/bradycardia syndrome - Patient evaluated by cardiology - Previously evaluated for bradycardia in the postconversion setting in 2015 with heart rates in the 30s to 40 bpm range. - EP evaluated at that time initially thought to need PPM implantation however this was deferred given stabilization - AV nodal blocking agents have been avoided since that time - Discussed case with Dr. Radford Pax cardiology feel there is no further reasonable cardiac intervention -9/18 currently A. 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 fib with RVR which has resolved. - TTE October 2019; LVEF 50 to 55%, unable to evaluate diastolic function due to A. fib - 9/15 echocardiogram  EF 20 to 25%, - Euvolemic on exam -Strict in and out -585 ml -Daily weight      Filed Weights   10/23/18 0500 10/24/18 0410 10/25/18 0528  Weight: 69.4 kg 69.8 kg 70 kg   Paroxysmal atrial fibrillation with RVR -See tachycardia/bradycardia syndrome -A. fib with RVR was noted up to 140s to 150s, now Rate controlled -Continue apixaban  CAD s/p PCI - Cannot use nodal blocking agents secondary to patient's tachycardia/bradycardia syndrome - Cannot use aspirin secondary to patient being on Eliquis  Essential HTN -Currently controlled would not add any additional agents given patient's Parkinson's disease and age would increase risk of fall  Syncope/near syncopal event - Most likely multifactorial, tachycardia/bradycardia syndrome, advanced Parkinson's disease - EEG negative for seizure activity, see  results below  Parkinson's disease -Followed by Dr. Metta Clines, [facility].  Last seen on 9/4  - Per Dr. Tomi Likens note at time of exam on 9/4 patient was positive weaker, with legs sometimes giving out. - Dysphagia present - Dr. Tomi Likens also added cyclobenzaprine 5 mg QHS (discussed patient may become more confused) - They were to follow-up in 4 months. -Continue Sinemet at home dose.Due to concern for worsening tremor/mental status changes, holding Flexeril and Ultracet.   Dysphasia  -Most likely secondary to advancing Parkinson's disease - 9/18 swallow study dysphagia 2 nectar thick liquid   Dementia/depression -On Aricept however do not believe is doing patient any further good considering he has been on it 3 to 4 years per his children.  Since patient will be going home on hospice will discontinue. -Continue Paxil 5 mg daily  Acute on CKD stage III (baseline Cr 1.3) Last Labs          Recent Labs  Lab 10/21/18 1700 10/22/18 0753 10/23/18 0232 10/24/18 0246 10/25/18 0135  CREATININE 1.33* 1.36* 1.53* 1.25* 1.37*    - Creatinine back to baseline  BPH - At family meeting recommended that we continue tamsulosin for comfort  Chronic thrombocytopenia - Stable  Insomnia -Discontinued all sedating medication (Flexeril, tramadol) secondary to perceived altered mental status. -Continue melatonin I  Adult failure to thrive - Multifactorial; advanced Parkinson's, dementia, worsening CHF - 9/17 family meeting decided to proceed with home with hospice.  Hypomagnesmia -Magnesium goal> 2    Goals of care - 9/17 discussed case at length with Dr. Fransico Him cardiology who feels given patient's other multiple medical problems (especially Parkinson's), no further appropriate intervention for cardiology at this time.  She discussed patient's  prognosis at length with son - 9/17 palliative care consult;GOC.  Family would like a face-to-face meeting with  palliative care to discuss options for taking father home vs inpatient hospice.  Decided on home with hospice     Discharge Diagnoses:  Principal Problem:   Acute CHF (congestive heart failure) (HCC) Active Problems:   Paroxysmal atrial fibrillation (HCC)   Long term current use of anticoagulant therapy   Parkinson's disease (HCC)   BPH (benign prostatic hyperplasia)   Syncope   Acute diastolic CHF (congestive heart failure) ([place])   Palliative care by specialist   Dementia due to Parkinson's disease without behavioral disturbance ([place])   DNR (do not resuscitate)   Palliative care encounter   Acute on chronic combined systolic and diastolic CHF (congestive heart failure) ([place])   Essential hypertension   Parkinsons disease ([place])   Acute renal failure superimposed on stage 3 chronic kidney disease ([place])   Adult failure to thrive   Parkinson disease ([place])    Discharge Condition: Guarded  Diet recommendation: Dysphasia 2  Filed Weights   10/23/18 0500 10/24/18 0410 10/25/18 0528  Weight: 69.4 kg 69.8 kg 70 kg    History of present illness:  85 y.o.WM PMHx Parkinson's disease,  Dementia, atrial fibrillation, CAD, iron deficiency anemia, chronic thrombocytopenia   Brought to the ER after patient had a brief syncopal episode during which patient also had some tremors and was experiencing shortness of breath. As per patient son patient does have shortness of breath chronically from his weakness from Parkinson's disease. Has not had any chest pain fever chills nausea vomiting or diarrhea. Has not had any productive cough. Today around 2 PM when patient was trying to go out in the cough patient suddenly felt weak and passed out for a minute or 2 following which patient appeared confused but did not have any incontinence of urine or bowel. The upper part of the extremities had tremors. Patient was brought to the ER. As per patient's son only new medication started was Flexeril by  patient's neurologist last week for sleeping.  ED Course:In the ER patient was appearing at back at baseline. Mildly hypoxic. Chest x-ray unremarkable. COVID-19 test was negative. EKG showed A. fib with rate around 90 bpm. Patient's labs show BNP of 1289. Patient was given 1 dose of Lasix 40 mg IV following which patient had good diuresis. At the time of my exam patient is not in distress and patient admitted for further observation. Other labs show hemoglobin of 12.8 platelets 95 creatinine 1.3. D-dimer was mildly elevated 0.73 CT head unremarkable.  Hospital Course:  After lengthy discussion with children, they decided to take patient home on hospice.  Procedures: 9/15 echocardiogram; LVEF 20-25%. -Left ventricular diastolic function could not be evaluated secondary to atrial fibrillation. Left ventricular diffuse hypokinesis. -Left atrial; severely dilated. - The mitral valve; mild to moderate. -Tricuspid valve regurgitation is mild-moderate. Aortic valve Moderate stenosis of the aortic valve. 9/16 EEG;-cortical dysfunction in left temporal region, non specific to etiology. -No seizures or epileptiform discharges     Consultations: Palliative care    Discharge Exam: Vitals:   10/25/18 1715 10/25/18 1939 10/25/18 2315 10/26/18 0435  BP:  (!) 117/96 115/83 (!) 161/101  Pulse:  83 79 83  Resp:  14 16   Temp: 97.6 F (36.4 C) 98 F (36.7 C) 97.7 F (36.5 C)   TempSrc: Oral Axillary Axillary   SpO2:  97% 96% 97%  Weight:      Height:  General: Sleepy but arousable, no acute respiratory distress Eyes: negative scleral hemorrhage, negative anisocoria, negative icterus ENT: Negative Runny nose, negative gingival bleeding, Neck:  Negative scars, masses, torticollis, lymphadenopathy, JVD Lungs: Clear to auscultation bilaterally without wheezes or crackles Cardiovascular: Irregular irregular rhythm and rate, without murmur gallop or rub normal S1 and  S2   Discharge Instructions   Allergies as of 10/26/2018      Reactions   Quinolones Other (See Comments)   Weakness, painful joints, lethergy      Medication List    STOP taking these medications   cyclobenzaprine 5 MG tablet Commonly known as: FLEXERIL   donepezil 10 MG tablet Commonly known as: ARICEPT   ipratropium-albuterol 0.5-2.5 (3) MG/3ML Soln Commonly known as: DUONEB     TAKE these medications   carbidopa-levodopa 50-200 MG tablet Commonly known as: SINEMET CR Take 1 tablet by mouth at bedtime. What changed: Another medication with the same name was changed. Make sure you understand how and when to take each.   carbidopa-levodopa 25-100 MG tablet Commonly known as: SINEMET IR TAKE 2 TABS AT 8AM, 12 PM, 4 PM AND 8 PM What changed: See the new instructions.   Eliquis 2.5 MG Tabs tablet Generic drug: apixaban Take 2.5 mg by mouth 2 (two) times daily.   FLORASTOR PO Take 1 capsule by mouth daily.   iron polysaccharides 150 MG capsule Commonly known as: NIFEREX Take 150 mg by mouth every other day.   latanoprost 0.005 % ophthalmic solution Commonly known as: XALATAN Place 1 drop into both eyes at bedtime.   losartan 25 MG tablet Commonly known as: COZAAR Take 0.5 tablets (12.5 mg total) by mouth daily. Start taking on: October 27, 2018   Melatonin 5 MG Caps Take 5 mg by mouth at bedtime.   multivitamin tablet Take 1 tablet by mouth daily.   ondansetron 4 MG tablet Commonly known as: ZOFRAN Take 1 tablet (4 mg total) by mouth every 6 (six) hours as needed for nausea.   PARoxetine 10 MG tablet Commonly known as: PAXIL Take 0.5 tablets (5 mg total) by mouth at bedtime.   Resource ThickenUp Clear Powd Use as directed   tamsulosin 0.4 MG Caps capsule Commonly known as: FLOMAX Take 0.4 mg by mouth every evening.   traMADol-acetaminophen 37.5-325 MG tablet Commonly known as: ULTRACET Take 1 tablet by mouth at bedtime.             Durable Medical Equipment  (From admission, onward)         Start     Ordered   10/24/18 0912  For home use only DME 3 n 1  Once     10/24/18 0912         Allergies  Allergen Reactions  . Quinolones Other (See Comments)    Weakness, painful joints, lethergy   Follow-up Information    [place], Peter M, MD Follow up.   Specialty: Cardiology Why: The office will call you to arrange your virtual visit follow-up appointment shortly after discharge. Please call the office number listed if you do not hear back within 1-2 days of discharge. Contact information: [place] [address] [phone]            The results of significant diagnostics from this hospitalization (including imaging, microbiology, ancillary and laboratory) are listed below for reference.    Significant Diagnostic Studies: Ct Head Wo Contrast  Result Date: 10/21/2018 CLINICAL DATA:  Altered LOC EXAM: CT HEAD  WITHOUT CONTRAST TECHNIQUE: Contiguous axial images were obtained from the base of the skull through the vertex without intravenous contrast. COMPARISON:  CT brain 03/26/2015 FINDINGS: Brain: Motion degradation over the posterior fossa and inferior brain. No acute consolidation or pleural effusion. Prominent atrophy. Moderate hypodensity in the white matter consistent with small vessel ischemic change. Stable ventricle size. Vascular: No hyperdense vessels.  Carotid vascular calcification Skull: No fracture Sinuses/Orbits: Mucosal thickening in the maxillary and ethmoid sinuses Other: None IMPRESSION: 1. Mild motion degraded study 2. No definite CT evidence for acute intracranial abnormality 3. Atrophy and small vessel ischemic change of white matter Electronically Signed   By: Donavan Foil M.D.   On: 10/21/2018 18:28   Nm Pulmonary Perf And Vent  Result Date: 10/22/2018 CLINICAL DATA:  Syncope, shortness of breath EXAM: NUCLEAR MEDICINE PERFUSION LUNG SCAN TECHNIQUE: Perfusion  images were obtained in multiple projections after intravenous injection of Tc-96m MAA. RADIOPHARMACEUTICALS:  1.5 mCi Tc40m MAA IV COMPARISON:  Chest radiograph 10/21/2018 FINDINGS: Perfusion: No wedge shaped peripheral perfusion defects to suggest acute pulmonary embolism. Expected photopenia along posterior portions of the chest related to the patient's lower arm position. Expected photopenia related to large cardiac contour. Ventilation images were not performed. IMPRESSION: 1. Normal perfusion lung scan with no specific defects to suggest pulmonary embolus. Ventilation images were not performed. Electronically Signed   By: Van Clines M.D.   On: 10/22/2018 12:09   Dg Chest Port 1 View  Result Date: 10/22/2018 CLINICAL DATA:  Shortness of breath. EXAM: PORTABLE CHEST 1 VIEW COMPARISON:  Lung scan 10/22/2018. Chest x-ray 10/21/2018, 01/18/2017. FINDINGS: Cardiomegaly with normal pulmonary vascularity. No focal infiltrate. Chronic bilateral interstitial prominence noted. No pleural effusion or pneumothorax. IMPRESSION: 1.  Stable cardiomegaly with normal pulmonary vascularity. 2. Chronic bilateral interstitial lung disease. No acute focal infiltrate. Electronically Signed   By: Marcello Moores  Register   On: 10/22/2018 12:27   Dg Chest Port 1 View  Result Date: 10/21/2018 CLINICAL DATA:  Per pt's son: they were placing pt in passenger seat of car Today and while driving pt begun to look blank in the face and drooling and unable to response when spoken to. Son called EMS because he noted that he was having increased tremors which concerned the son as possible seizure activity. Pt w/ hx of parkinsonian tremors, son reports today's episode was just more severe. EMS reports pt is in afib during transport, which he has a remote hx of, been off meds for it for over 10 years and has not had recurrent episode. Per pt no chest pain but SOB during exam. Hx of A-fib, HTN. EXAM: PORTABLE CHEST 1 VIEW COMPARISON:   06/21/2018 FINDINGS: Cardiac silhouette is mildly enlarged. No mediastinal or hilar masses. Lungs demonstrate stable prominent bronchovascular markings. No evidence of pneumonia or pulmonary edema. No pleural effusion or pneumothorax. Skeletal structures are demineralized. Prior vertebroplasty has been performed in the lower thoracic and lumbar spine. IMPRESSION: No acute cardiopulmonary disease. Electronically Signed   By: Lajean Manes M.D.   On: 10/21/2018 17:05    Microbiology: Recent Results (from the past 240 hour(s))  SARS CORONAVIRUS 2 (TAT 6-24 HRS) Nasopharyngeal Nasopharyngeal Swab     Status: None   Collection Time: 10/21/18  8:33 PM   Specimen: Nasopharyngeal Swab  Result Value Ref Range Status   SARS Coronavirus 2 NEGATIVE NEGATIVE Final    Comment: (NOTE) SARS-CoV-2 target nucleic acids are NOT DETECTED. The SARS-CoV-2 RNA is generally detectable in upper and lower respiratory  specimens during the acute phase of infection. Negative results do not preclude SARS-CoV-2 infection, do not rule out co-infections with other pathogens, and should not be used as the sole basis for treatment or other patient management decisions. Negative results must be combined with clinical observations, patient history, and epidemiological information. The expected result is Negative. Fact Sheet for Patients: SugarRoll.be Fact Sheet for Healthcare Providers: https://www.-mathews.com/ This test is not yet approved or cleared by the [place] FDA and  has been authorized for detection and/or diagnosis of SARS-CoV-2 by FDA under an Emergency Use Authorization (EUA). This EUA will remain  in effect (meaning this test can be used) for the duration of the COVID-19 declaration under Section 56 4(b)(1) of the Act, 21 U.S.C. section 360bbb-3(b)(1), unless the authorization is terminated or revoked sooner. Performed at [facility], [place]  [address], [place], [place] [address]   MRSA PCR Screening     Status: None   Collection Time: 10/22/18  5:39 AM   Specimen: Nasal Mucosa; Nasopharyngeal  Result Value Ref Range Status   MRSA by PCR NEGATIVE NEGATIVE Final    Comment:        The GeneXpert MRSA Assay (FDA approved for NASAL specimens only), is one component of a comprehensive MRSA colonization surveillance program. It is not intended to diagnose MRSA infection nor to guide or monitor treatment for MRSA infections. Performed at [facility], [place] [address] [place],  [address]      Labs: Basic Metabolic Panel: Recent Labs  Lab 10/22/18 0753 10/23/18 0232 10/24/18 0246 10/25/18 0135 10/26/18 0343  NA 141 140 136 137 138  K 3.6 3.6 3.7 4.1 3.7  CL 103 102 103 103 102  CO2 24 29 23 25 24   GLUCOSE 95 105* 89 94 96  BUN 21 29* 25* 25* 24*  CREATININE 1.36* 1.53* 1.25* 1.37* 1.33*  CALCIUM 9.3 9.0 8.8* 8.9 8.8*  MG 1.8  --   --  1.8 2.0   Liver Function Tests: Recent Labs  Lab 10/21/18 1700 10/22/18 0753  AST 36 34  ALT 5 8  ALKPHOS 56 62  BILITOT 1.4* 1.8*  PROT 6.3* 6.6  ALBUMIN 3.5 3.8   No results for input(s): LIPASE, AMYLASE in the last 168 hours. No results for input(s): AMMONIA in the last 168 hours. CBC: Recent Labs  Lab 10/21/18 1700 10/22/18 0753 10/23/18 0232 10/24/18 0246 10/25/18 0135 10/26/18 0343  WBC 5.3 7.1 6.5 6.0 6.5 6.2  NEUTROABS 4.2  --   --   --   --   --   HGB 12.8* 14.0 12.8* 13.4 13.6 13.4  HCT 39.1 41.8 40.3 40.3 41.2 42.6  MCV 97.3 95.0 96.0 94.6 94.9 97.0  PLT 95* 94* 106* 101* 123* 108*   Cardiac Enzymes: No results for input(s): CKTOTAL, CKMB, CKMBINDEX, TROPONINI in the last 168 hours. BNP: BNP (last 3 results) Recent Labs    10/21/18 1700  BNP 1,289.8*    ProBNP (last 3 results) No results for input(s): PROBNP in the last 8760 hours.  CBG: No results for input(s): GLUCAP in the last 168 hours.     Signed:  Dia Crawford,  MD [facility] [phone] pager

## 2018-10-26 NOTE — TOC Transition Note (Signed)
Transition of Care [facility]) - CM/SW Discharge Note   Patient Details  Name: Henry Alvarez MRN: [id number] Date of Birth: [birth year]  [facility] CM/SW Contact:  Archie Endo, LCSW Phone Number: 10/26/2018, 10:12 AM   Clinical Narrative:    CSW spoke with patient's son [name] to confirm discharge plan. Patient will be transported home via [facility] at 12pm today. [name] and family will be there upon his arrival. RN to notify [facility] of discharge time.  CSW arranged for transportation at 12pm today.  Final next level of care: Home w Hospice Care Barriers to Discharge: No Barriers Identified   Patient Goals and CMS Choice Patient states their goals for this hospitalization and ongoing recovery are:: Get better CMS Medicare.gov Compare Post Acute Care list provided to:: Patient Represenative (must comment) Choice offered to / list presented to : Adult Children  Discharge Placement                Patient to be transferred to facility by: Patient going home via [place] Name of family member notified: [name] Patient and family notified of of transfer: 10/26/18  Discharge Plan and Services In-house Referral: Hospice / Palliative Care Discharge Planning Services: CM Consult Post Acute Care Choice: Home Health          DME Arranged: 3-N-1 DME Agency: [employer] Date DME Agency Contacted: 10/24/18 Time DME Agency Contacted: [phone] Representative spoke with at DME Agency: zack HH Arranged: PT, OT, Speech Therapy [place] Agency: [facility] (formerly [employer]) Date [place]: 10/24/18 Time [place]: 0920 Representative spoke with at [place]: [place] ([place]) Interventions     Readmission Risk Interventions No flowsheet data found.

## 2018-10-26 NOTE — Progress Notes (Signed)
Discharged home with hospice. Discharged instructions given to patient's son-Drake Landgren. [facility] transporting patinet. Personal belongings given,Prescription given, son advised to pick up medications called in to pharmacy of choice. Verbalized understanding of instructions.Transported by [facility] at this time

## 2018-10-26 NOTE — Progress Notes (Signed)
Patient transferred to [facility]. Pt alert and aware of transfer. Personal belongings sent with patient:  eye glasses, shoes and small suitcase. Patients son was called and made aware of transfer and updated on patients condition.

## 2018-10-28 NOTE — Telephone Encounter (Signed)
Called son [name] and made him aware Dr. Tomi Likens was made aware of changes made to medication for his dad in the hospital (prior note). Informed Drake that Dr. Tomi Likens agrees with stopping Aricept. Drake verbalizes understanding.

## 2018-10-28 NOTE — Telephone Encounter (Signed)
Spoke to son Henry Alvarez and his dad is home now but was at [facility] for 4-5 nights due to cardiac issues. The hospital MD stopped the Aricept at d/c as they said that it might block effects of a new BP med that he was started on.  Also son stated the MD at hospital stated Aricept was better for earlier onset dementia and he didn't know how much it was helping patient now. Son stated his dad did have more confusion while in hospital than he typically does.  He wanted to make you aware of above as you had ordered the Aricept. Informed son would let Dr. Tomi Likens know above and call him back with any information.

## 2018-10-28 NOTE — Telephone Encounter (Signed)
Noted.  I agree with stopping Aricept

## 2018-11-06 ENCOUNTER — Other Ambulatory Visit: Payer: Self-pay | Admitting: Neurology

## 2018-11-15 ENCOUNTER — Other Ambulatory Visit: Payer: Self-pay | Admitting: Neurology

## 2018-11-15 NOTE — Telephone Encounter (Signed)
Requested Prescriptions   Pending Prescriptions Disp Refills  . carbidopa-levodopa (SINEMET IR) 25-100 MG tablet [Pharmacy Med Name: CARBIDOPA-LEVODOPA 25-100 TAB] 240 tablet 0    Sig: TAKE 2 TABS AT 8AM, 12 PM, 4 PM AND 8 PM   Rx last filled: 08/12/18 #240 2 refill  Pt last seen:10/11/18  Follow up appt scheduled: 02/18/19

## 2019-01-07 DEATH — deceased

## 2019-02-18 ENCOUNTER — Ambulatory Visit: Payer: Medicare Other | Admitting: Neurology
